# Patient Record
Sex: Female | Born: 1955 | Race: White | Hispanic: No | Marital: Married | Smoking: Former smoker
Health system: Southern US, Community
[De-identification: ages and names within clinical notes are randomized; demographics above are authoritative.]

## PROBLEM LIST (undated history)

## (undated) DIAGNOSIS — Z5189 Encounter for other specified aftercare: Secondary | ICD-10-CM

## (undated) DIAGNOSIS — E785 Hyperlipidemia, unspecified: Secondary | ICD-10-CM

## (undated) DIAGNOSIS — M1990 Unspecified osteoarthritis, unspecified site: Secondary | ICD-10-CM

## (undated) DIAGNOSIS — G43909 Migraine, unspecified, not intractable, without status migrainosus: Secondary | ICD-10-CM

## (undated) DIAGNOSIS — F32A Depression, unspecified: Secondary | ICD-10-CM

## (undated) DIAGNOSIS — K219 Gastro-esophageal reflux disease without esophagitis: Secondary | ICD-10-CM

## (undated) DIAGNOSIS — R32 Unspecified urinary incontinence: Secondary | ICD-10-CM

## (undated) DIAGNOSIS — K589 Irritable bowel syndrome without diarrhea: Secondary | ICD-10-CM

## (undated) DIAGNOSIS — M797 Fibromyalgia: Secondary | ICD-10-CM

## (undated) DIAGNOSIS — D126 Benign neoplasm of colon, unspecified: Secondary | ICD-10-CM

## (undated) DIAGNOSIS — M8580 Other specified disorders of bone density and structure, unspecified site: Secondary | ICD-10-CM

## (undated) DIAGNOSIS — F419 Anxiety disorder, unspecified: Secondary | ICD-10-CM

## (undated) DIAGNOSIS — Z87442 Personal history of urinary calculi: Secondary | ICD-10-CM

## (undated) DIAGNOSIS — F329 Major depressive disorder, single episode, unspecified: Secondary | ICD-10-CM

## (undated) DIAGNOSIS — H269 Unspecified cataract: Secondary | ICD-10-CM

## (undated) DIAGNOSIS — Z9889 Other specified postprocedural states: Secondary | ICD-10-CM

## (undated) DIAGNOSIS — IMO0001 Reserved for inherently not codable concepts without codable children: Secondary | ICD-10-CM

## (undated) DIAGNOSIS — T7840XA Allergy, unspecified, initial encounter: Secondary | ICD-10-CM

## (undated) DIAGNOSIS — R112 Nausea with vomiting, unspecified: Secondary | ICD-10-CM

## (undated) DIAGNOSIS — N200 Calculus of kidney: Secondary | ICD-10-CM

## (undated) DIAGNOSIS — I10 Essential (primary) hypertension: Secondary | ICD-10-CM

## (undated) DIAGNOSIS — E669 Obesity, unspecified: Secondary | ICD-10-CM

## (undated) HISTORY — PX: DEBRIDEMENT TENNIS ELBOW: SHX1442

## (undated) HISTORY — DX: Calculus of kidney: N20.0

## (undated) HISTORY — PX: ABDOMINAL HYSTERECTOMY: SHX81

## (undated) HISTORY — DX: Allergy, unspecified, initial encounter: T78.40XA

## (undated) HISTORY — DX: Migraine, unspecified, not intractable, without status migrainosus: G43.909

## (undated) HISTORY — DX: Nausea with vomiting, unspecified: Z98.890

## (undated) HISTORY — DX: Major depressive disorder, single episode, unspecified: F32.9

## (undated) HISTORY — DX: Reserved for inherently not codable concepts without codable children: IMO0001

## (undated) HISTORY — DX: Nausea with vomiting, unspecified: R11.2

## (undated) HISTORY — DX: Unspecified osteoarthritis, unspecified site: M19.90

## (undated) HISTORY — DX: Obesity, unspecified: E66.9

## (undated) HISTORY — DX: Unspecified cataract: H26.9

## (undated) HISTORY — PX: LUMBAR FUSION: SHX111

## (undated) HISTORY — PX: TOTAL KNEE ARTHROPLASTY: SHX125

## (undated) HISTORY — DX: Anxiety disorder, unspecified: F41.9

## (undated) HISTORY — DX: Gastro-esophageal reflux disease without esophagitis: K21.9

## (undated) HISTORY — DX: Encounter for other specified aftercare: Z51.89

## (undated) HISTORY — PX: APPENDECTOMY: SHX54

## (undated) HISTORY — PX: ROTATOR CUFF REPAIR: SHX139

## (undated) HISTORY — DX: Unspecified urinary incontinence: R32

## (undated) HISTORY — DX: Hyperlipidemia, unspecified: E78.5

## (undated) HISTORY — PX: EYE SURGERY: SHX253

## (undated) HISTORY — DX: Fibromyalgia: M79.7

## (undated) HISTORY — DX: Benign neoplasm of colon, unspecified: D12.6

## (undated) HISTORY — DX: Irritable bowel syndrome without diarrhea: K58.9

## (undated) HISTORY — DX: Other specified disorders of bone density and structure, unspecified site: M85.80

## (undated) HISTORY — DX: Depression, unspecified: F32.A

---
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
## 1990-08-13 HISTORY — PX: ABDOMINAL HYSTERECTOMY: SHX81

## 1992-08-13 HISTORY — PX: FOOT SURGERY: SHX648

## 1998-08-13 HISTORY — PX: BACK SURGERY: SHX140

## 1999-01-16 ENCOUNTER — Encounter: Payer: Self-pay | Admitting: Family Medicine

## 1999-01-16 ENCOUNTER — Ambulatory Visit (HOSPITAL_COMMUNITY): Admission: RE | Admit: 1999-01-16 | Discharge: 1999-01-16 | Payer: Self-pay | Admitting: Family Medicine

## 1999-01-20 ENCOUNTER — Encounter: Admission: RE | Admit: 1999-01-20 | Discharge: 1999-02-03 | Payer: Self-pay | Admitting: Family Medicine

## 1999-03-03 ENCOUNTER — Encounter: Payer: Self-pay | Admitting: Neurosurgery

## 1999-03-03 ENCOUNTER — Ambulatory Visit (HOSPITAL_COMMUNITY): Admission: RE | Admit: 1999-03-03 | Discharge: 1999-03-03 | Payer: Self-pay | Admitting: Neurosurgery

## 1999-03-20 ENCOUNTER — Encounter: Payer: Self-pay | Admitting: Neurosurgery

## 1999-03-20 ENCOUNTER — Ambulatory Visit (HOSPITAL_COMMUNITY): Admission: RE | Admit: 1999-03-20 | Discharge: 1999-03-20 | Payer: Self-pay | Admitting: Neurosurgery

## 1999-04-07 ENCOUNTER — Encounter: Payer: Self-pay | Admitting: Neurosurgery

## 1999-04-07 ENCOUNTER — Ambulatory Visit (HOSPITAL_COMMUNITY): Admission: RE | Admit: 1999-04-07 | Discharge: 1999-04-07 | Payer: Self-pay | Admitting: Neurosurgery

## 2000-05-28 ENCOUNTER — Encounter: Admission: RE | Admit: 2000-05-28 | Discharge: 2000-05-29 | Payer: Self-pay | Admitting: Family Medicine

## 2000-08-13 HISTORY — PX: BACK SURGERY: SHX140

## 2000-08-13 HISTORY — PX: NISSEN FUNDOPLICATION: SHX2091

## 2001-03-04 ENCOUNTER — Encounter: Admission: RE | Admit: 2001-03-04 | Discharge: 2001-03-04 | Payer: Self-pay | Admitting: Neurosurgery

## 2001-03-04 ENCOUNTER — Encounter: Payer: Self-pay | Admitting: Neurosurgery

## 2001-03-27 ENCOUNTER — Encounter: Payer: Self-pay | Admitting: Neurosurgery

## 2001-03-27 ENCOUNTER — Encounter: Admission: RE | Admit: 2001-03-27 | Discharge: 2001-03-27 | Payer: Self-pay | Admitting: Neurosurgery

## 2001-04-11 ENCOUNTER — Encounter: Payer: Self-pay | Admitting: Neurosurgery

## 2001-04-15 ENCOUNTER — Encounter: Payer: Self-pay | Admitting: Neurosurgery

## 2001-04-15 ENCOUNTER — Inpatient Hospital Stay (HOSPITAL_COMMUNITY): Admission: RE | Admit: 2001-04-15 | Discharge: 2001-04-18 | Payer: Self-pay | Admitting: Neurosurgery

## 2001-04-25 ENCOUNTER — Encounter: Admission: RE | Admit: 2001-04-25 | Discharge: 2001-04-25 | Payer: Self-pay | Admitting: Neurosurgery

## 2001-04-25 ENCOUNTER — Encounter: Payer: Self-pay | Admitting: Neurosurgery

## 2001-06-10 ENCOUNTER — Other Ambulatory Visit: Admission: RE | Admit: 2001-06-10 | Discharge: 2001-06-10 | Payer: Self-pay | Admitting: Family Medicine

## 2001-06-30 ENCOUNTER — Encounter: Admission: RE | Admit: 2001-06-30 | Discharge: 2001-07-14 | Payer: Self-pay | Admitting: Orthopedic Surgery

## 2001-07-14 ENCOUNTER — Encounter: Admission: RE | Admit: 2001-07-14 | Discharge: 2001-09-09 | Payer: Self-pay | Admitting: Neurosurgery

## 2001-07-16 ENCOUNTER — Ambulatory Visit (HOSPITAL_BASED_OUTPATIENT_CLINIC_OR_DEPARTMENT_OTHER): Admission: RE | Admit: 2001-07-16 | Discharge: 2001-07-16 | Payer: Self-pay | Admitting: Orthopedic Surgery

## 2001-07-29 ENCOUNTER — Encounter: Admission: RE | Admit: 2001-07-29 | Discharge: 2001-08-29 | Payer: Self-pay | Admitting: Orthopedic Surgery

## 2001-08-13 HISTORY — PX: ELBOW SURGERY: SHX618

## 2001-08-13 HISTORY — PX: NASAL SINUS SURGERY: SHX719

## 2001-10-28 ENCOUNTER — Inpatient Hospital Stay (HOSPITAL_COMMUNITY): Admission: RE | Admit: 2001-10-28 | Discharge: 2001-11-01 | Payer: Self-pay | Admitting: Neurosurgery

## 2001-10-28 ENCOUNTER — Encounter: Payer: Self-pay | Admitting: Neurosurgery

## 2001-11-26 ENCOUNTER — Ambulatory Visit (HOSPITAL_BASED_OUTPATIENT_CLINIC_OR_DEPARTMENT_OTHER): Admission: RE | Admit: 2001-11-26 | Discharge: 2001-11-26 | Payer: Self-pay | Admitting: Orthopedic Surgery

## 2002-01-21 ENCOUNTER — Encounter: Admission: RE | Admit: 2002-01-21 | Discharge: 2002-01-21 | Payer: Self-pay | Admitting: Neurosurgery

## 2002-01-21 ENCOUNTER — Encounter: Payer: Self-pay | Admitting: Neurosurgery

## 2002-02-10 ENCOUNTER — Encounter: Payer: Self-pay | Admitting: Orthopaedic Surgery

## 2002-02-10 ENCOUNTER — Inpatient Hospital Stay (HOSPITAL_COMMUNITY): Admission: RE | Admit: 2002-02-10 | Discharge: 2002-02-14 | Payer: Self-pay | Admitting: Orthopaedic Surgery

## 2002-02-13 ENCOUNTER — Encounter: Payer: Self-pay | Admitting: Orthopaedic Surgery

## 2002-02-15 ENCOUNTER — Emergency Department (HOSPITAL_COMMUNITY): Admission: EM | Admit: 2002-02-15 | Discharge: 2002-02-15 | Payer: Self-pay | Admitting: Emergency Medicine

## 2002-02-15 ENCOUNTER — Encounter: Payer: Self-pay | Admitting: Specialist

## 2002-05-19 ENCOUNTER — Encounter: Admission: RE | Admit: 2002-05-19 | Discharge: 2002-08-17 | Payer: Self-pay | Admitting: Orthopaedic Surgery

## 2002-08-03 ENCOUNTER — Ambulatory Visit (HOSPITAL_BASED_OUTPATIENT_CLINIC_OR_DEPARTMENT_OTHER): Admission: RE | Admit: 2002-08-03 | Discharge: 2002-08-03 | Payer: Self-pay | Admitting: Family Medicine

## 2002-08-13 HISTORY — PX: SPINAL CORD STIMULATOR IMPLANT: SHX2422

## 2002-08-13 HISTORY — PX: NISSEN FUNDOPLICATION: SHX2091

## 2002-08-13 HISTORY — PX: KIDNEY STONE SURGERY: SHX686

## 2002-08-27 ENCOUNTER — Ambulatory Visit (HOSPITAL_COMMUNITY): Admission: RE | Admit: 2002-08-27 | Discharge: 2002-08-27 | Payer: Self-pay | Admitting: Gastroenterology

## 2002-10-02 ENCOUNTER — Encounter: Payer: Self-pay | Admitting: General Surgery

## 2002-10-02 ENCOUNTER — Encounter: Admission: RE | Admit: 2002-10-02 | Discharge: 2002-10-02 | Payer: Self-pay | Admitting: General Surgery

## 2002-10-06 ENCOUNTER — Ambulatory Visit (HOSPITAL_COMMUNITY): Admission: RE | Admit: 2002-10-06 | Discharge: 2002-10-06 | Payer: Self-pay | Admitting: Gastroenterology

## 2002-11-06 ENCOUNTER — Encounter: Payer: Self-pay | Admitting: General Surgery

## 2002-11-07 ENCOUNTER — Inpatient Hospital Stay (HOSPITAL_COMMUNITY): Admission: RE | Admit: 2002-11-07 | Discharge: 2002-11-08 | Payer: Self-pay | Admitting: General Surgery

## 2002-12-21 ENCOUNTER — Ambulatory Visit (HOSPITAL_COMMUNITY): Admission: RE | Admit: 2002-12-21 | Discharge: 2002-12-21 | Payer: Self-pay | Admitting: General Surgery

## 2002-12-21 ENCOUNTER — Encounter: Payer: Self-pay | Admitting: General Surgery

## 2003-01-18 ENCOUNTER — Other Ambulatory Visit: Admission: RE | Admit: 2003-01-18 | Discharge: 2003-01-18 | Payer: Self-pay | Admitting: Family Medicine

## 2003-02-18 ENCOUNTER — Ambulatory Visit (HOSPITAL_BASED_OUTPATIENT_CLINIC_OR_DEPARTMENT_OTHER): Admission: RE | Admit: 2003-02-18 | Discharge: 2003-02-18 | Payer: Self-pay | Admitting: Urology

## 2003-02-18 ENCOUNTER — Encounter: Payer: Self-pay | Admitting: Urology

## 2003-02-26 ENCOUNTER — Encounter: Payer: Self-pay | Admitting: Family Medicine

## 2003-02-26 ENCOUNTER — Ambulatory Visit (HOSPITAL_COMMUNITY): Admission: RE | Admit: 2003-02-26 | Discharge: 2003-02-26 | Payer: Self-pay | Admitting: Family Medicine

## 2003-08-14 HISTORY — PX: SPINAL CORD STIMULATOR IMPLANT: SHX2422

## 2004-03-02 ENCOUNTER — Ambulatory Visit (HOSPITAL_COMMUNITY): Admission: RE | Admit: 2004-03-02 | Discharge: 2004-03-03 | Payer: Self-pay | Admitting: Orthopaedic Surgery

## 2005-04-24 ENCOUNTER — Encounter: Admission: RE | Admit: 2005-04-24 | Discharge: 2005-05-03 | Payer: Self-pay | Admitting: Orthopaedic Surgery

## 2005-08-13 HISTORY — PX: LUMBAR FUSION: SHX111

## 2005-12-03 ENCOUNTER — Ambulatory Visit (HOSPITAL_BASED_OUTPATIENT_CLINIC_OR_DEPARTMENT_OTHER): Admission: RE | Admit: 2005-12-03 | Discharge: 2005-12-03 | Payer: Self-pay | Admitting: Orthopaedic Surgery

## 2005-12-14 ENCOUNTER — Encounter: Admission: RE | Admit: 2005-12-14 | Discharge: 2005-12-14 | Payer: Self-pay | Admitting: Orthopaedic Surgery

## 2006-01-10 ENCOUNTER — Inpatient Hospital Stay (HOSPITAL_COMMUNITY): Admission: RE | Admit: 2006-01-10 | Discharge: 2006-01-14 | Payer: Self-pay | Admitting: Orthopaedic Surgery

## 2006-05-27 ENCOUNTER — Ambulatory Visit: Payer: Self-pay | Admitting: Gastroenterology

## 2006-06-13 DIAGNOSIS — D126 Benign neoplasm of colon, unspecified: Secondary | ICD-10-CM

## 2006-06-13 HISTORY — DX: Benign neoplasm of colon, unspecified: D12.6

## 2006-06-19 ENCOUNTER — Encounter (INDEPENDENT_AMBULATORY_CARE_PROVIDER_SITE_OTHER): Payer: Self-pay | Admitting: *Deleted

## 2006-06-19 ENCOUNTER — Ambulatory Visit: Payer: Self-pay | Admitting: Gastroenterology

## 2006-06-20 ENCOUNTER — Ambulatory Visit (HOSPITAL_COMMUNITY): Admission: RE | Admit: 2006-06-20 | Discharge: 2006-06-20 | Payer: Self-pay | Admitting: Obstetrics and Gynecology

## 2006-06-28 ENCOUNTER — Encounter: Admission: RE | Admit: 2006-06-28 | Discharge: 2006-06-28 | Payer: Self-pay | Admitting: Orthopaedic Surgery

## 2006-07-01 ENCOUNTER — Encounter: Payer: Self-pay | Admitting: Cardiology

## 2006-07-10 ENCOUNTER — Ambulatory Visit (HOSPITAL_COMMUNITY): Admission: RE | Admit: 2006-07-10 | Discharge: 2006-07-10 | Payer: Self-pay | Admitting: Orthopaedic Surgery

## 2006-08-08 ENCOUNTER — Ambulatory Visit (HOSPITAL_BASED_OUTPATIENT_CLINIC_OR_DEPARTMENT_OTHER): Admission: RE | Admit: 2006-08-08 | Discharge: 2006-08-08 | Payer: Self-pay | Admitting: Orthopaedic Surgery

## 2006-08-13 HISTORY — PX: SPINAL CORD STIMULATOR REMOVAL: SHX2423

## 2006-08-13 HISTORY — PX: KNEE ARTHROSCOPY: SUR90

## 2006-10-31 ENCOUNTER — Inpatient Hospital Stay (HOSPITAL_COMMUNITY): Admission: RE | Admit: 2006-10-31 | Discharge: 2006-11-03 | Payer: Self-pay | Admitting: Orthopaedic Surgery

## 2007-02-25 ENCOUNTER — Encounter: Admission: RE | Admit: 2007-02-25 | Discharge: 2007-02-25 | Payer: Self-pay | Admitting: Orthopaedic Surgery

## 2007-03-18 ENCOUNTER — Ambulatory Visit (HOSPITAL_BASED_OUTPATIENT_CLINIC_OR_DEPARTMENT_OTHER): Admission: RE | Admit: 2007-03-18 | Discharge: 2007-03-18 | Payer: Self-pay | Admitting: Orthopaedic Surgery

## 2007-05-22 ENCOUNTER — Encounter: Admission: RE | Admit: 2007-05-22 | Discharge: 2007-05-22 | Payer: Self-pay | Admitting: Orthopaedic Surgery

## 2007-07-14 ENCOUNTER — Encounter: Admission: RE | Admit: 2007-07-14 | Discharge: 2007-07-14 | Payer: Self-pay | Admitting: Orthopaedic Surgery

## 2007-12-05 ENCOUNTER — Inpatient Hospital Stay (HOSPITAL_COMMUNITY): Admission: RE | Admit: 2007-12-05 | Discharge: 2007-12-11 | Payer: Self-pay | Admitting: Orthopaedic Surgery

## 2008-01-09 ENCOUNTER — Encounter: Admission: RE | Admit: 2008-01-09 | Discharge: 2008-01-09 | Payer: Self-pay | Admitting: Orthopaedic Surgery

## 2008-02-27 ENCOUNTER — Ambulatory Visit (HOSPITAL_COMMUNITY): Admission: RE | Admit: 2008-02-27 | Discharge: 2008-02-27 | Payer: Self-pay | Admitting: Orthopaedic Surgery

## 2008-06-17 ENCOUNTER — Encounter: Admission: RE | Admit: 2008-06-17 | Discharge: 2008-06-17 | Payer: Self-pay | Admitting: Orthopaedic Surgery

## 2008-06-22 ENCOUNTER — Encounter: Admission: RE | Admit: 2008-06-22 | Discharge: 2008-06-22 | Payer: Self-pay | Admitting: Orthopaedic Surgery

## 2008-10-10 ENCOUNTER — Encounter: Payer: Self-pay | Admitting: Cardiology

## 2008-12-29 ENCOUNTER — Encounter: Admission: RE | Admit: 2008-12-29 | Discharge: 2008-12-29 | Payer: Self-pay | Admitting: Orthopaedic Surgery

## 2009-02-15 ENCOUNTER — Encounter: Admission: RE | Admit: 2009-02-15 | Discharge: 2009-02-15 | Payer: Self-pay | Admitting: Orthopaedic Surgery

## 2009-03-25 ENCOUNTER — Encounter: Payer: Self-pay | Admitting: Cardiology

## 2009-03-30 ENCOUNTER — Encounter: Payer: Self-pay | Admitting: Cardiology

## 2009-04-04 DIAGNOSIS — I10 Essential (primary) hypertension: Secondary | ICD-10-CM | POA: Insufficient documentation

## 2009-04-04 DIAGNOSIS — J45909 Unspecified asthma, uncomplicated: Secondary | ICD-10-CM | POA: Insufficient documentation

## 2009-04-04 DIAGNOSIS — Z8669 Personal history of other diseases of the nervous system and sense organs: Secondary | ICD-10-CM | POA: Insufficient documentation

## 2009-04-04 DIAGNOSIS — Z87898 Personal history of other specified conditions: Secondary | ICD-10-CM | POA: Insufficient documentation

## 2009-04-06 ENCOUNTER — Ambulatory Visit: Payer: Self-pay | Admitting: Cardiology

## 2009-07-01 ENCOUNTER — Ambulatory Visit (HOSPITAL_COMMUNITY): Admission: RE | Admit: 2009-07-01 | Discharge: 2009-07-01 | Payer: Self-pay | Admitting: Orthopaedic Surgery

## 2009-07-22 ENCOUNTER — Inpatient Hospital Stay (HOSPITAL_COMMUNITY): Admission: RE | Admit: 2009-07-22 | Discharge: 2009-07-27 | Payer: Self-pay | Admitting: Orthopaedic Surgery

## 2009-09-21 ENCOUNTER — Encounter: Admission: RE | Admit: 2009-09-21 | Discharge: 2009-12-20 | Payer: Self-pay | Admitting: Orthopaedic Surgery

## 2010-03-14 ENCOUNTER — Emergency Department (HOSPITAL_COMMUNITY): Admission: EM | Admit: 2010-03-14 | Discharge: 2010-03-14 | Payer: Self-pay | Admitting: Emergency Medicine

## 2010-09-03 ENCOUNTER — Encounter: Payer: Self-pay | Admitting: Obstetrics and Gynecology

## 2010-09-04 ENCOUNTER — Encounter: Payer: Self-pay | Admitting: Orthopaedic Surgery

## 2010-11-14 LAB — PROTIME-INR
INR: 1.15 (ref 0.00–1.49)
INR: 1.84 — ABNORMAL HIGH (ref 0.00–1.49)
INR: 1.87 — ABNORMAL HIGH (ref 0.00–1.49)
Prothrombin Time: 18.4 seconds — ABNORMAL HIGH (ref 11.6–15.2)
Prothrombin Time: 21.1 seconds — ABNORMAL HIGH (ref 11.6–15.2)

## 2010-11-14 LAB — BASIC METABOLIC PANEL
BUN: 11 mg/dL (ref 6–23)
BUN: 15 mg/dL (ref 6–23)
CO2: 28 mEq/L (ref 19–32)
CO2: 29 mEq/L (ref 19–32)
Calcium: 8.8 mg/dL (ref 8.4–10.5)
Chloride: 103 mEq/L (ref 96–112)
Chloride: 104 mEq/L (ref 96–112)
Chloride: 106 mEq/L (ref 96–112)
Creatinine, Ser: 0.65 mg/dL (ref 0.4–1.2)
GFR calc Af Amer: >60 mL/min (ref >60)
GFR calc non Af Amer: >60 mL/min (ref >60)
Glucose, Bld: 142 mg/dL — ABNORMAL HIGH (ref 70–99)
Glucose, Bld: 97 mg/dL (ref 70–99)
Potassium: 3.4 mEq/L — ABNORMAL LOW (ref 3.5–5.1)
Potassium: 3.7 mEq/L (ref 3.5–5.1)
Sodium: 136 mEq/L (ref 135–145)

## 2010-11-14 LAB — CBC
HCT: 27.1 % — ABNORMAL LOW (ref 36.0–46.0)
HCT: 28.1 % — ABNORMAL LOW (ref 36.0–46.0)
HCT: 29.2 % — ABNORMAL LOW (ref 36.0–46.0)
Hemoglobin: 9.3 g/dL — ABNORMAL LOW (ref 12.0–15.0)
MCHC: 32.8 g/dL (ref 30.0–36.0)
MCHC: 32.9 g/dL (ref 30.0–36.0)
MCV: 83.1 fL (ref 78.0–100.0)
MCV: 83.7 fL (ref 78.0–100.0)
MCV: 84.4 fL (ref 78.0–100.0)
MCV: 84.5 fL (ref 78.0–100.0)
Platelets: 187 10*3/uL (ref 150–400)
Platelets: 257 10*3/uL (ref 150–400)
RBC: 3.45 MIL/uL — ABNORMAL LOW (ref 3.87–5.11)
RDW: 15.1 % (ref 11.5–15.5)
RDW: 15.4 % (ref 11.5–15.5)
WBC: 7.7 10*3/uL (ref 4.0–10.5)
WBC: 8.1 10*3/uL (ref 4.0–10.5)

## 2010-11-14 LAB — URINE CULTURE: Colony Count: 9000

## 2010-11-14 LAB — ANAEROBIC CULTURE

## 2010-11-14 LAB — TISSUE CULTURE: Culture: NO GROWTH

## 2010-11-14 LAB — GRAM STAIN

## 2010-12-26 NOTE — Op Note (Signed)
NAMEHALEIGH, DESMITH NO.:  [id number]   MEDICAL RECORD NO.:  [id number]          PATIENT TYPE:  AMB   LOCATION:  SDS                          FACILITY:  [facility]   PHYSICIAN:  Vanita Panda. Magnus Ivan, M.D.DATE OF BIRTH:  November 24, 1955   DATE OF PROCEDURE:  02/27/2008  DATE OF DISCHARGE:  02/27/2008                               OPERATIVE REPORT   PREOPERATIVE DIAGNOSES:  Left knee patellofemoral crepitation and  patella clunk, status post total knee arthroplasty.   POSTOPERATIVE DIAGNOSES:  Left knee patellofemoral crepitation and  patella clunk, status post total knee arthroplasty.   PROCEDURE:  Left knee arthroscopy with debridement.   SURGEON:  Vanita Panda. Magnus Ivan, MD   ANESTHESIA:  General.   BLOOD LOSS:  Minimal.   COMPLICATIONS:  None.   INDICATIONS:  Ms. Casebeer is a 55 year old who almost 3 months ago  underwent a left total knee arthroplasty.  She comes in complaining of  patellofemoral crepitation and grinding, and there is a slight clunk to  her patella.  This is likely build up a scar tissue, and I recommended  she undergo arthroscopy with debridement.  The risks and benefits of  this were explained to her at length, and she agreed to proceed with  surgery.   PROCEDURE:  After informed consent was obtained, appropriate left knee  was marked.  She was brought to the operating room and placed supine on  the operating table.  General anesthesia was then obtained.  Antibiotic  had already been given as well.  Her left leg was then prepped and  draped with DuraPrep and sterile drapes including a sterile stockinette.  Sterile tourniquet was placed underneath, but it was not utilized during  the case.  A time-out was called again.  She was identified as  the  correct patient and correct extremity.  I then made an anterolateral  portal.  An arthroscope was inserted, you could see there was abundant  scar tissue associated with patella in the  anterior aspect of the knee.  I made a medial portal and used an  arthroscopic shaver lightly to  debride the scar tissue along the anterior aspect of the knee.  The  femoral component and tibial component all seemed to be intact as well  as the polyethylene insert, and there were no other problems that I  could see within the knee.  I then removed all instrumentation, allowed  fluid to lavage from the knee, and then injected a small mixture of 8 mg  of Morphine and  0.5% Sensorcaine with epinephrine to the knee.  The arthroscopic portal  sites were closed with interrupted 3-0 nylon suture.  Xeroform followed,  well-padded sterile dressing was applied, and the patient was awakened,  extubated, and taken to the recovery room in stable condition.  All  final counts were correct, and no other complications noted.      Vanita Panda. Magnus Ivan, M.D.  Electronically Signed     CYB/MEDQ  D:  02/27/2008  T:  02/28/2008  Job:  [id number]

## 2010-12-26 NOTE — Discharge Summary (Signed)
NAMEMarland Kitchen  Baker, Valerie Baker NO.:  [id number]   MEDICAL RECORD NO.:  [id number]          PATIENT TYPE:  INP   LOCATION:  5013                         FACILITY:  [facility]   PHYSICIAN:  Vanita Panda. Magnus Ivan, M.D.DATE OF BIRTH:  [birth year]   DATE OF ADMISSION:  12/05/2007  DATE OF DISCHARGE:  12/11/2007                               DISCHARGE SUMMARY   ADMISSION DIAGNOSIS:  Severe degenerative joint disease of left knee.   FINAL DISCHARGE DIAGNOSIS:  Severe degenerative joint disease of left  knee.   PROCEDURE:  Left total knee arthroplasty on December 05, 2007.   HOSPITAL COURSE:  [name] is a 55 year old female with severe  osteoarthritis involving her left knee.  She had failed conservative  treatment with antiinflammatories and NSAIDs and antiinflammatory  injections in her knee. We tried physical therapy and even the  arthroscopic surgery that showed significant cartilage defects  throughout the knee.  After worsening pain and the effect of that in her  daily living, we talked about total knee replacement and she agreed to  proceed with this.  On the day of admission, she was taken to the  operating room where she underwent a total knee arthroplasty of the left  knee without complications.   Postoperatively, she was admitted to regular floor and started on a CPM  full range of motion of her knee and she was started on DVT prophylaxis  and progressed slowly through physical therapy.  By the day of  discharge, her incision was clean, dry, and intact.  She was afebrile  with stable vital signs.  She was tolerating oral diet as well as oral  pain medications.  It was felt she could be discharged safely to home  with further home health physical therapy.   DISPOSITION:  To home.   DISCHARGE INSTRUCTIONS:  While she is at home, she will continue her CPM  and continue to weight bear as tolerated with home health physical  therapy working on her gait training,  mobility, and range of motion of  her knee.  Follow up with the office will be established in 2 weeks.   DISCHARGE MEDICATIONS:  1. Robaxin.  2. Coumadin adjusted for target INR of 2-3.  3. Amlodipine.  4. Sertraline.  5. Prevacid.  6. Singulair.  7. Morphine sulphate.  8. Lyrica.  9. Endocet.  10.Oxybutynin.  11.Trazodone.  12.Lactulose.      Vanita Panda. Magnus Ivan, M.D.  Electronically Signed     CYB/MEDQ  D:  12/11/2007  T:  12/11/2007  Job:  [id number]

## 2010-12-26 NOTE — Op Note (Signed)
NAMEMarland Baker  [name], [name] NO.:  [id number]   MEDICAL RECORD NO.:  [id number]          PATIENT TYPE:  AMB   LOCATION:  DSC                          FACILITY:  [facility]   PHYSICIAN:  Sharolyn Douglas, M.D.        DATE OF BIRTH:  Feb 29, 1956   DATE OF PROCEDURE:  03/18/2007  DATE OF DISCHARGE:                               OPERATIVE REPORT   DIAGNOSIS:  Lumbar spondylosis.  History of previous lumbar fusion from  L2 to L5 with chronic pain syndrome.   PROCEDURE:  1. Bilateral L1-2 facet joint injections.  2. Fluoroscopic imaging used for the above injections during needle      placement.   SURGEON:  Sharolyn Douglas, MD.   ASSISTANT:  None.   ANESTHESIA:  MAC plus local.   COMPLICATIONS:  None.   INDICATIONS:  The patient is a pleasant 55 year old female with chronic  pain syndrome.  She has had previous L2 to L5 fusion.  She now presents  for diagnostic, potentially therapeutic ,facet injections done above the  fusion at L1-2.  Risk, benefits, alternatives reviewed.  The patient  elected to proceed.   PROCEDURE:  After informed consent, she was taken the operating room.  She was turned prone.  The back was prepped, draped in the usual sterile  fashion.  She underwent sedation by Anesthesia.  Fluoroscopy was brought  into the field and we identified the L1-2 facet joints.  22 gauge  Quincke spinal needles were then advanced into the facet joints  bilaterally at L1-2 under fluoroscopy.  Aspiration showed no blood.  We  then injected a solution of 80 mg Depo-Medrol, 1 mL 1% preservative-free  lidocaine in and around each facet joint capsule.  The patient tolerated  the procedure well.  There were no apparent complications.  She was  transferred to recovery in stable condition.  I reviewed post injection  instructions with her.  She will follow-up in two to three weeks.      Sharolyn Douglas, M.D.  Electronically Signed     MC/MEDQ  D:  03/18/2007  T:  03/18/2007  Job:  [id number]

## 2010-12-26 NOTE — Op Note (Signed)
NAMEMarland Kitchen  Baker, Valerie Baker NO.:  [id number]   MEDICAL RECORD NO.:  [id number]          PATIENT TYPE:  INP   LOCATION:  5013                         FACILITY:  [facility]   PHYSICIAN:  Vanita Panda. Magnus Ivan, M.D.DATE OF BIRTH:  10/12/1955   DATE OF PROCEDURE:  12/05/2007  DATE OF DISCHARGE:                               OPERATIVE REPORT   PREOPERATIVE DIAGNOSES:  Severe degenerative joint disease and  osteoarthritis, left knee.   POSTOPERATIVE DIAGNOSES:  Severe degenerative joint disease and  osteoarthritis, left knee.   PROCEDURE:  Left total knee arthroplasty using computer navigation.   IMPLANTS:  DePuy rotating platform knee with size 3 femur, size 3 tibia  component, 12.5-mm polyethylene insert, and size 35-mm patellar button.   SURGEON:  Vanita Panda. Magnus Ivan, MD   ASSISTANT:  Wende Neighbors, PA-C   ANESTHESIA:  1. Left leg femoral nerve block.  2. General.   ANTIBIOTICS:  2 g of IV Ancef.   TOURNIQUET TIME:  1 hour and 50 minutes.   BLOOD LOSS:  200 mL.   COMPLICATIONS:  None.   INDICATIONS:  Briefly, Valerie Baker is a 55 year old female who I have  performed arthroscopic surgery on her left knee.  She had debilitating  pain in this knee and then arthroscopy.  She had significant varus  deformity of at least 9 degrees and almost bone-on-bone wear on the  medial side.  She had some patellofemoral arthritis as well as bone  spurs on the tibial plateau.  I have tried to temporize her with  injections and anti-inflammatories and the pain was worsening.  She  wished to proceed with a total knee replacement.  I counseled to her  extensively including the possible revision in the future given her age  and the need to lose weight.  She wished to proceed with surgery and  understood the risks and benefits.  I also described to her the risk of  a DVT and a fatal PE.   PROCEDURE:  After informed consent was obtained, the appropriate left  leg was  marked and femoral block was obtained by anesthesia.  She was  then brought to the operating room and placed in supine on the operating  table.  General anesthesia was then obtained.  A nonsterile tourniquet  was placed around her upper left leg.  Her leg was prepped and draped  with DuraPrep and sterile drapes including a sterile stockinette.  A  time out was called, and she was identified as the correct patient and  correct left knee.  I then used an Esmarch to wrap out the leg and  tourniquet was inflated to 350 mm of pressure.  A midline incision was  made directly over the patella and carried proximally and distally.  I  then dissected the soft tissues and took a medial parapatellar approach  to the knee just off the vastus medialis and then coming around the  patella down to the tibial tubercle area.  I was able to easily invert  the patella.  Then, I cleaned the soft tissue including the medial and  lateral meniscus, the fat pad  of the ACL and PCL, as well as  osteophytes.  Next, I proceeded with the computer navigation portion of  the case.  Through 2 small separate stab incisions, guide pins were  inserted from an anteromedial to posterolateral direction in the tibia  and then guide pins were inserted in the anteromedial to posterolateral  in the femur.  I then placed soft tissue arrays for the navigation  portion of the case.  I then used computer navigation to create a model  of the knee.  First, a tibia cut was then made, taking 10 mm off the  high side and 5 mm off the low side.  This was verified under computer  navigation with a neutral slope.  Next, a distal femoral cut was made  followed by the posterior femoral cut and the chamfer cuts.  I used  spacer blocks in flexion and extension to correctly balance the knee,  and this was verified under the computer navigation as well.  I also  balanced the soft tissues and released as much as I could on the medial  side including  the semimembranosus.  With the computer help, we selected  a size 3 femur and a size 3 tibia, and these were placed with trial  components.  I then chose a 35-mm patellar button.  Once these trial  components were now placed, a 10 mm polyethylene was inserted for the  knee through range of motion and I felt that this was stable.  I then  removed all components.  I thoroughly irrigated the tissues using pulse  lavage and normal saline solution.  I then cemented the real size 3  tibial component, followed by the real size 3 femoral component.  I  placed a 10-mm trial insert and cemented the polyethylene button.  Once  the cement had dried up with the knee through range of motion and I did  not like the stability with the 10-mm and we went up to a 12.5-mm  polyethylene insert.  I felt that the stability improved dramatically.  This again was verified under fluoroscopic guidance.  I then placed the  real size 12.5 polyethylene insert.  I then copiously irrigated the  tissues again.  I let the tourniquet down, and hemostasis was obtained.  We placed a medium Hemovac drain.  I then closed the deep arthrotomy  with #1 Vicryl suture followed by 0 Vicryl in the deep tissue and next  layer with 2-0 Vicryl and then a running 4-0 Vicryl suture in  subcutaneous tissue and Steri-Strips were applied.  The patient's knee  was placed in a well-padded sterile dressing and a knee immobilizer.  She was awakened, extubated, and taken to the recovery room in stable  condition.  All final counts were correct, and there were no  complications noted.       Vanita Panda. Magnus Ivan, M.D.  Electronically Signed     CYB/MEDQ  D:  12/05/2007  T:  12/06/2007  Job:  [id number]

## 2010-12-29 NOTE — Op Note (Signed)
NAME:  Valerie Baker, Valerie Baker                     ACCOUNT NO.:  [id number]   MEDICAL RECORD NO.:  [id number]                   PATIENT TYPE:  OIB   LOCATION:  5034                                 FACILITY:  [facility]   PHYSICIAN:  Sharolyn Douglas, M.D.                     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   PROCEDURES:  1. Bilateral hemilaminotomy for placement of two permanent spinal cord     stimulator leads, T10, T11.  2. Implantation of rechargeable Irby pulse generator, left hip.  3. Radiographic interpretation of fluoroscopic images used for implantation     of prominent spinal cord stimulator leads.   SURGEON:  Sharolyn Douglas, M.D.   ASSISTANT:  Verlin Fester, P.A.   ANESTHESIA:  General endotracheal.   COMPLICATIONS:  None.   INDICATIONS:  Patient is a very pleasant 55 year old female with chronic  back and bilateral leg pain, left greater than right, secondary to post  laminectomy syndrome.  She has failed all conservative treatment options.  Dr. Ethelene Hal performed a trial spinal cord stimulator lead with good  improvement in her chronic pain.  She has elected to undergo permanent  placement of spinal cord stimulator leads and IPG in hopes of improving her  symptoms.  Risks, benefits and alternatives are extensively reviewed.  I had  counseled the patient regarding the benefits of performing the procedure  under conscious sedation.  Unfortunately, she does not feel that she could  tolerate the procedure while awake.  She knows the possibility of poor  placement if we cannot perform intraoperative testing, which would require  return to the operating room for adjustment.  She would like to proceed with  general anesthesia.   PROCEDURE:  Patient was properly identified in the  holding area and taken to  the operating room.  Underwent general endotracheal anesthesia without  difficulty and prophylactic IV antibiotics.  Carefully positioned onto the  Wilson frame.  All bony prominences were padded.  The face and eyes were  protected at all times.  The fluoroscopy was brought into the field.  In the  T10-11 interspace identified.  A 4 cm incision was made in the midline.  Dissection was carried down through the deep fascia.  The T10-11 interspace  was exposed bilaterally.  A deep retractor placed.  Hemilaminotomies  completed bilaterally with thin-foot Kerrison punches.  The ligamentum  flavum was released from the under surface of the T10 lamina.  At this  point, the stimulator leads could easily pass into the spinal canal.  Using  fluoroscopy, the leads were placed bilaterally.  Lateral imaging confirmed  dorsal placement.  We performed intraoperative neuromuscular testing and had  good contraction of the paraspinal muscles bilaterally,  both proximally and  distally.  The leads were then anchored to the interspinous ligament.  The  previous iliac crest bone graft incision was utilized for placement of the  IPG on the left side.  The previous incision was incised, which was in a  vertical fashion lateral to the PSIS.  A subcutaneous pocket was created 2  cm deep.  The tunneling tool was then used to tunnel the two stimulator  leads from the thoracic incision out to the left hip incision.  The IPG was  then placed into the subcutaneous pocket, and the leads attached.  The leaf  was placed both proximally and distally.  The IPG was tested.  The wounds  were irrigated.  The incision was closed with 0 Vicryl deep fascia  followed by 2-0 Vicryl and 3-0 subcuticular sutures.  Benzoin and Steri-  Strips placed.  Sterile dressing applied.  The patient was turned supine and  extubated without difficulty.  Transferred to the recovery room in stable  condition, able to move  her upper and lower extremities.                                               Sharolyn Douglas, M.D.    MC/MEDQ  D:  03/05/2004  T:  03/05/2004  Job:  [id number]

## 2010-12-29 NOTE — Op Note (Signed)
NAMEMarland Baker  ILHAM, ROUGHTON NO.:  [id number]   MEDICAL RECORD NO.:  [id number]          PATIENT TYPE:  INP   LOCATION:  2550                         FACILITY:  [facility]   PHYSICIAN:  Sharolyn Douglas, M.D.        DATE OF BIRTH:  1955-09-22   DATE OF PROCEDURE:  10/31/2006  DATE OF DISCHARGE:                               OPERATIVE REPORT   DIAGNOSES:  1. 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  2. Chronic pain syndrome.  3. Severe right groin and thigh pain.  4. Chronic back pain.   PROCEDURES:  1. Exploration of spinal fusion from L3-L5, with removal of segmental      pedicle screw instrumentation.  2. Revision laminectomy L2-3, with decompression of the thecal sac and      nerve roots.  3. Transforaminal lumbar interbody fusion L2-3, with placement of 9-mm      PEEK cage.  4. Posterior spinal fusion at L2-3.  5. Segmental pedicle screw instrumentation L2-L5.  6. Local autogenous bone graft supplemented with BMP.   SURGEON:  Sharolyn Douglas, M.D.   ASSISTANT:  Verlin Fester, P.A.   ANESTHESIA:  General endotracheal.   ESTIMATED BLOOD LOSS:  300 cc.   COMPLICATIONS:  None.   NEEDLE AND SPONGE COUNT:  Correct.   INDICATIONS:  The patient is a pleasant 55 year old female with chronic  pain syndrome.  She has had several previous lumbar procedures,  including an L3-5 instrumented decompression and fusion.  She has  developed severe right groin pain which is thought to be related to an  upper lumbar radiculopathy.  She had a diagnostic injection at L2-3 on  the right, with complete resolution of her groin pain for several hours.  She now presents for extension of the decompression and fusion across  the L2-3 segment.  She has a spinal cord stimulator in place, and we  will try to avoid damaging the wires.  She understands there is a  possibility the device may not function after surgery.  Risks,  benefits,  and alternatives were reviewed.  She elected to proceed.   PROCEDURE:  After informed consent, she was taken to the operating room.  She underwent general endotracheal anesthesia without difficulty and  given prophylactic IV antibiotics.  Neuro monitoring was established in  the form of lower extremity SSEPs and EMGs.  She was turned prone onto  the Crest frame.  All bony prominences were padded.  Face and eyes were  protected at all times.  Back prepped and draped in the usual sterile  fashion.  The previous incision was utilized.  Dissection was carried  through the scar.  Exposure was completed out over the instrumentation  from L3-L5.  Using the appropriate screwdrivers, the locking caps were  removed.  The fusion was then evaluated using the electrocautery from L3-  L5 and found to be solid.  In addition, pulling on the pedicle screws  showed they were well fixed, and also there was no motion occurring.  We  then turned our attention to  the L2-3 segment.  A revision laminectomy  was completed by removing the residual spinous process and lamina.  Curettes were used to dissect the edges of the previous epidural  fibrosis. The spinal canal was entered. The laminectomy was widened.  We  identified the L2 and L3 nerve roots on the right side.  The L3 root  appeared to be compressed within the lateral recess by the overhanging  superior facet.  This was decompressed flush with the pedicle.  We then  turned our attention to completing the posterior fusion.  Pedicle screws  were placed at L2 bilaterally using an anatomic probing technique.  We  utilized 5.5 x 45 mm screws.  The bone quality was average, and the  screw purchase was good.  We stimulated each pedicle screw with  triggered EMGs, and there were no deleterious changes.  Before placing  pedicle screws, we decorticated the transverse processes of L2 and also  the residual process of L3.  We then turned our attention to  completing  a transforaminal lumbar interbody fusion on the right side in order to  further decompress the exiting nerve root and improve the fusion rate.  The remaining facette joint was osteotomized.  The exiting and  transversing nerve roots were identified and protected at all times.  Free running EMGs were monitored.  The disc space was entered, and a  radical diskectomy was completed across the contralateral side using  curved curettes.  The disc space was dilated to 9 mm.  We then packed  the disc space with local bone graft obtained from laminectomy along  with BMP sponges from the medium Infuse kit.  A 9 mm PEEK cage was  packed with BMP sponge inserted into the interspace, tamped anteriorly  and across the midline.  There were no changes in the free running EMGs  throughout the TLIF procedure.  We completed the posterior spinal fusion  by packing the remaining local bone graft into the lateral gutters.  We  also completed a facette fusion on the left side.  We then placed short  segment titanium rods which had been bent into normal lordosis.  These  were applied into the polyaxial screw heads from L2 down to L5.  The  locking caps were applied. Compression was placed across the L2-3  segment before shearing off the locking caps.  Hemostasis was achieved.  Intraoperative x-ray was taken which showed appropriate positioning of  the instrumentation.  A deep Hemovac drain was placed.  We did not  encounter the stimulator wires throughout the procedure.  The deep  fascia was closed with a running #1 Vicryl suture.  Subcutaneous layer  was closed with 0 Vicryl suture, 2-0 Vicryl suture, and then a running 3-  0 nylon suture to approximate the skin edges.  Sterile dressing was  applied.  The patient was turned supine, extubated without difficulty,  and transferred to recovery in stable condition, able to move her upper and lower extremities.   It should be noted my assistant [facility], PA was present throughout  procedure, including the removal of the instrumentation, the exposure,  the decompression, the instrumentation, and the fusion.  She also  assisted with wound closure.      Sharolyn Douglas, M.D.  Electronically Signed     MC/MEDQ  D:  10/31/2006  T:  10/31/2006  Job:  [id number]

## 2010-12-29 NOTE — Op Note (Signed)
NAMEMarland Kitchen  Valerie Baker, Valerie Baker NO.:  [id number]   MEDICAL RECORD NO.:  [id number]          PATIENT TYPE:  INP   LOCATION:  2550                         FACILITY:  [facility]   PHYSICIAN:  Sharolyn Douglas, M.D.        DATE OF BIRTH:  1955-12-23   DATE OF PROCEDURE:  01/10/2006  DATE OF DISCHARGE:                                 OPERATIVE REPORT   DIAGNOSES:  1.  L4-5 spondylosis and spinal stenosis.  2.  History of previous L3-4 fusion with pseudoarthrosis and hardware.  3.  Chronic back and bilateral lower extremity pain.  4.  Post laminectomy syndrome.   PROCEDURE PERFORMED:  1.  Removal of instrumentation from L3-4 and exploration of posterior      arthrodesis.  2.  Revision L3-4 and L4-5 lumbar laminectomy with wide decompression of the      thecal sac and nerve roots bilaterally.  3.  Segmental pedicle screw instrumentation, L3-L5, using the Abbott spine      system.  4.  Posterior spinal fusion, L3-L5.  5.  Transforaminal lumbar interbody fusion, L4-5, with placement of 12-mm      PEEK prosthetic cage.  6.  Local autogenous bone graft supplemented with bone morphogenic protein      and 15 mL of graft on allograft.   SURGEON:  Sharolyn Douglas, M.D.   ASSISTANT:  Verlin Fester, P.A.   ANESTHESIA:  General endotracheal.   ESTIMATED BLOOD LOSS:  200 mL.   COMPLICATIONS:  None.   INDICATIONS:  The patient is a pleasant 55 year old female with progressive  back and bilateral lower extremity pain.  She has a history of previous L3-4  fusion with Ray cages and pedicle screws.  She has developed severe  subjacent spinal stenosis.  She has a history of chronic pain and has a  spinal cord stimulator.  The IPG is over her left hip.  Risk and benefits of  revision surgery to explore her L3-4 fusion and extend across the L4-L5  segment discussed with the patient.  She elected to proceed in hopes of  improving her symptoms. Risks and benefits were reviewed.   PROCEDURE:  The patient  was identified in holding area and taken to the  operating room.  She underwent general endotracheal anesthesia without  difficulty.  She was given prophylactic IV antibiotics.  Neuro monitoring  was established in the form of lower extremity SFVP using the  EMGs.  She  was carefully turned prone onto the Wilson frame.  All bony prominences  padded.  Face and eyes protected at times. Back prepped, draped in usual  sterile fashion.  Previous midline incision was utilized and extended  several inches distally.  Dissection was carefully carried through the scar  tissue with care taken not to interfere with the spinal cord stimulator  leads.  These were not directly identified.  Once the dissection was carried  deep to the fascia, subperiosteal exposure was carried out to the tips the  transverse processes of L5 bilaterally.  The previous instrumentation at L3-  4 was exposed and deep retractors placed.  We did use  the appropriate  screwdrivers to remove the previous pedicle screws at L3 and L4.  We removed  the rod.  The screws were backed out.  The screws in the L4 vertebral body  appeared to be loose.  We then examined the space between the L3 and L4  spinous processes while using Kocher clamps to apply force.  It appeared  that there was a small amount of motion still between the L3 and L4 levels.  We therefore decided to extend the fusion across the L3-4 segment as well.  The previous fusion mass was carefully exposed between L3 and L4.  It did  not appear to be completed on the right side.  On the left side, there did  appear to be a bridge of bone between L3 and L4.  We then turned our  attention to performing a revision laminectomy by removing the remaining  spinous process and lamina of L5.  The ligamentum flavum was removed  piecemeal.  We found severe spinal stenosis at the L4-5 segment.  This was  widely decompressed out flush with the pedicles.  We identified the L5 nerve  roots  and completed the decompression out towards the foramen.  We then  carried the dissection up towards the L4 segment, removing some residual  bone left from previous operation.  The epidural fibrosis was teased off of  the underlying lamina.  The L4 nerve roots were identified and appeared to  be scarred more so on the right side than left.  Neurolysis was  accomplished.  We then turned our attention to placing pedicle screws at L3  and L4 and L5 bilaterally.  We used an anatomic probing technique at L5.  Pedicle holes were started using an awl.  Steffe pedicle probe used to  cannulate the pedicles.  Pedicles were then tapped and 6.5 x 40-mm screws  were placed.  Bone quality was somewhat soft, but the purchase was good.  We  then placed a pedicle screw on the right side at L4, 6.5 x 40 mm.  We chose  to leave the screw out on the left side at L4 because it appeared to be  somewhat medial based on preoperative CT scan.  The 5.5 x 45 mm screws were  used bilaterally at L3.  Each pedicle screw was then stimulated using  triggered EMGs, and there were no deleterious changes.  We then turned our  attention to complete the transforaminal lumbar interbody fusion on the  right side at L4-5.  The remaining facette joint was osteotomized.  The  exiting and transversing nerve roots were identified and protected at all  times.  Sharp annulotomy was completed.  The disk space was dilated up to 13  mm.  Curved __________ curettes were used to scrape the cartilaginous  endplates and complete a diskectomy across the contralateral side.  The disk  space was then packed with BMP sponges along with local autogenous bone  graft from the laminectomy.  A 13-mm PEEK cage was packed with a BMP sponge,  carefully inserted into the interspace and tamped anteriorly and across the  midline.  Throughout the TLIF procedure, we monitored frequent EMGs, and there were no deleterious changes.  We then completed the  posterior spinal  arthrodesis by decorticating the transverse processes of L5 bilaterally and  the residual fusion mass between L3 and L4.  The remaining local bone graft  along with 15 mL of graft on allograft was packed tightly in the lateral  gutters.  The 80-mm titanium rods were placed into the polyaxial screw  heads, and the locking caps were applied.  Compression was applied before  shearing off the locking caps.  A cross connector was placed.  Hemostasis  was achieved.  The wound was irrigated.  Gelfoam left over the exposed  epidural space.  A deep Hemovac drain was left.  The deep fascia closed with  a running #1 Vicryl suture.  Subcutaneous layer closed with 0 Vicryl and 2-0  Vicryl followed by a running 3-0 subcuticular Vicryl suture on the skin  edges.  Benzoin and Steri-Strips were placed.  Sterile dressing applied.  The patient was turned supine, extubated without difficulty and transferred  to recovery in stable condition, able to move her upper and lower  extremities.      Sharolyn Douglas, M.D.  Electronically Signed     MC/MEDQ  D:  01/10/2006  T:  01/10/2006  Job:  [id number]

## 2010-12-29 NOTE — Op Note (Signed)
[place]  Patient:    Valerie Baker, MANGANELLI Visit Number: [id number] MRN: [id number]          Service Type: DSU Location: [facility] Attending Physician:  Alinda Deem Dictated by:   Alinda Deem, M.D. Proc. 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  PROCEDURES:  Left shoulder arthroscopic anterior inferior acromioplasty and distal clavicle excision.  SURGEON:  Alinda Deem, M.D.  ASSISTANT:  Dorthula Matas, P.A.-C.  ANESTHESIA:  General endotracheal.  ESTIMATED BLOOD LOSS:  Minimal.  FLUID REPLACEMENT:  800 cc crystalloid.  DRAINS:  None.  TOURNIQUET TIME:  None.  INDICATION FOR PROCEDURE:  The patient is a 55 year old woman who underwent a successful right shoulder decompression and distal clavicle excision by me some years ago.  Has similar pain pattern on the left side.  Has failed conservative treatment, including cortisone injections, which did not provide short-term or long-term relief; however, despite this she desires elective decompression of her left shoulder.  She has also been worked up with an MRI scan of the neck showing no compressive lesion on the left side as well as an MRI scan of the shoulder, which does show some tendinosis but no full-thickness rotator cuff tear.  DESCRIPTION OF PROCEDURE:  Patient identified by arm band and taken to the operating room at [facility] day surgery center, appropriate anesthetic monitor attached, and general endotracheal anesthesia induced with the patient in the supine position.  She was then placed in the beach chair position and the left upper extremity prepped and draped in the usual sterile fashion from the wrist to the  hemithorax.  The skin along the anterior, lateral, and posterior aspects of the acromion process was infiltrated with 2-3 cc of 0.5% Marcaine and epinephrine solution and then a #11 blade was used to make portals 1.5 cm anterior to the [facility] joint, lateral to the junction of the middle and posterior thirds of the acromion, and posterior to the posterolateral corner of the acromion process.  The inflow was placed anteriorly, the arthroscope laterally, and a 4.2 mm [place] sucker shaver posteriorly, allowing subacromial bursectomy and outlining of the subacromial spur.  Small bleeders were identified and cauterized with the underwater electrocautery, and the subacromial spur was then removed with the 4.5 mm hooded Vortex bur.  The distal clavicle was noted to be bare-bone in some areas consistent with [facility] joint arthritis, and we then removed the distal 1 cm of the clavicle starting out with the standard scope position initially and then completing the excision by bringing the scope in anteriorly, the water laterally, and an arthroscope posteriorly.  We then repositioned the arthroscope into the glenohumeral joint using the posterior portal with the inflow attached to the scope, and diagnostic arthroscopy revealed an intact labrum, a normal biceps tendon, subscap tendon, supraspinatus and infraspinatus tendons were intact with a slight amount of inflammation.  The articular cartilage of the glenohumeral joint was in excellent condition.  At this point the shoulder was washed out with normal saline solution and the arthroscopic instruments removed and a dressing of Xeroform, 4 x 4 dressing sponges, and paper tape applied.  The patient was then awakened  and taken to the recovery room without difficulty. Dictated by:   Alinda Deem, M.D. Attending Physician:  Alinda Deem DD:  07/16/01 TD:  07/16/01 Job: [id number]

## 2010-12-29 NOTE — Discharge Summary (Signed)
NAMEMarland Kitchen  Valerie Baker, Valerie Baker NO.:  [id number]   MEDICAL RECORD NO.:  [id number]          PATIENT TYPE:  INP   LOCATION:  5012                         FACILITY:  [facility]   PHYSICIAN:  Sharolyn Douglas, M.D.        DATE OF BIRTH:  1955-09-13   DATE OF ADMISSION:  01/10/2006  DATE OF DISCHARGE:  01/14/2006                                 DISCHARGE SUMMARY   ADMITTING DIAGNOSES:  1. Spinal stenosis L4/5, below fusion at L3/4.  2. Hypertension.  3. Asthma.  4. History of migraines.  5. History of anxiety and depression.  6. Postoperative hyponatremia.  7. Postoperative hypokalemia.   DISCHARGE DIAGNOSIS:  1. Status post revision posterior spinal fusion L4/5, doing well.  2. Postoperative blood loss anemia.  3. Postoperative hyperglycemia.   PROCEDURE:  On Jan 10, 2006 the patient was taken to the operation room for  revision of spinal fusion at L4/5.  Evaluation of fusion at L3/4.   SURGEON:  Sharolyn Douglas, M.D.  Assistant Verlin Fester, PA-C   ANESTHESIA:  General.   CONSULTS:  None.   LABS:  CBC with diff was within normal limits postoperatively.  H&H were  monitored.  Reached a low of 10.0 and 28.8 on January 13, 2006.  PT INR and PTT  preop normal.  Complete metabolic panel preop was normal except blood  glucose of 112.  Sodium was 134 and potassium of 3.3.  Basic metabolic panel  x2 days postoperatively showed a glucose of 110 and 121, otherwise normal.  A UA from Jan 03, 2006.  Blood typing from Jan 03, 2006 was A+.  Antibody  screen was negative.  UA from Jan 03, 2006 showed multiple bacterial types  present.  None predominant suggesting inappropriate collection.   X-RAYS:  From Jan 10, 2006 of the lumbar spine was used intra operatively  for localization.  X-rays from January 14, 2006 of the lumbar spine show screw  and rod fixation at L3/4.  No EKG seen on the chart.   BRIEF HISTORY:  The patient is a 55 year old female who has undergone  previous posterior spine fusion  procedure as well as spinal cord stimulator.  Had increasing pain, was found to have severe spinal stenosis adjacent to  her previous fusion.  Because of her failure to improve as well as the  severity of these findings, it was thought our best course of management  would be revision, decompression, and fusion at that level.  The risks and  benefits of the procedure have been discussed with the patient by Dr. Noel Gerold  as well as by myself.  She indicated understanding and opted to proceed with  surgery.   HOSPITAL COURSE:  On Jan 10, 2006, the patient was taken to the operating  room for the above-listed procedures.  She tolerated the procedure well  without any intraoperative complications.  Intraoperative blood loss was  approximately 250 mL.  She was transferred to the recovery room in stable  condition.   Postoperatively, routine orthopedic spine protocol was followed and she  progressed along very well.  On postoperative day two her  Hemovac drain was  discontinued without any difficulty.  Her Foley catheter was removed and her  PCA was discontinued.   The patient worked with physical therapy and occupational therapy on a daily  basis, progressive ambulation program, back precautions, as well as brace  used.  By January 14, 2006 she was independent and safe with all of the above.   By January 14, 2006 the patient was medically as well as orthopedically stable  and ready for discharge.   DISCHARGE PLAN:  The patient is a 55 year old female status post revision  posterior spinal fusion doing very well.  Activities, daily ambulation  program, brace on when she is out, daily dressing changes, keeping incision  dry until all drainage stops, back precautions, and no lifting greater than  five pounds.   FOLLOW UP:  Two weeks postoperatively with Dr. Noel Gerold.   MEDICATIONS ON DISCHARGE:  Percocet for pain, Robaxin for muscle spasm,  multivitamin daily, calcium daily, Colace twice daily, laxative  as needed.  Avoid NSAIDs.  Resume home medications.   DIET:  Regular home diet, as tolerated.   CONDITION ON DISCHARGE:  Stable, improved.   DISPOSITION:  The patient is being discharged to her home with family  assistance as well as home physical therapy and occupational therapy.      Verlin Fester, P.A.      Sharolyn Douglas, M.D.  Electronically Signed    CM/MEDQ  D:  03/06/2006  T:  03/06/2006  Job:  [id number]

## 2010-12-29 NOTE — Op Note (Signed)
   NAME:  Valerie Baker, Valerie Baker                     ACCOUNT NO.:  [id number]   MEDICAL RECORD NO.:  [id number]                   PATIENT TYPE:  AMB   LOCATION:  ENDO                                 FACILITY:  [facility]   PHYSICIAN:  John C. Madilyn Fireman, M.D.                 DATE OF BIRTH:  1956-07-15   DATE OF PROCEDURE:  08/27/2002  DATE OF DISCHARGE:                                 OPERATIVE REPORT   PROCEDURE:  Esophagogastroduodenoscopy.   INDICATIONS FOR PROCEDURE:  Reflux symptoms, refractory to double dose  proton pump inhibitor.  The procedure is to assess the anatomy of the upper  GI tract and evaluation of advisability of possible anti-reflux surgery.   DESCRIPTION OF PROCEDURE:  The patient was placed in the left lateral  decubitus position and placed on the pulse monitor, with continuous low-flow  oxygen delivered by nasal cannula.  She was sedated with 100 mcg IV fentanyl  and 8 mg IV Versed.  The Olympus video endoscope was advanced under direct  vision into the oropharynx and esophagus.  The esophagus was straight  proximally, but of  normal caliber with slight tortuosity distally.  However,  the squamocolumnar line is somewhat irregular at about  37 cm.  The GE junction did appear to be slightly patchulous, although no reflux was  seen during the procedure.  There was an estimated 1 cm in length hiatal  hernia distal to the squamocolumnar line.  There is no visible esophagitis,  ring or stricture.  The stomach was entered and a small amount of liquid  secretions were suctioned from the fundus.  Retroflex view of the cardia  confirmed a small hiatal hernia and was otherwise unremarkable.  The fundus,  body, antrum and pylorus all appeared normal.  The duodenum was entered and  both the bulb and second portion were well inspected, appeared to be within  normal limits.   The scope was then withdrawn and the patient returned to the recovery room  in stable condition.  She tolerated  the procedure well and there were no  immediate complications.   IMPRESSION:  Small hiatal hernia with irregular squamocolumnar line.   PLAN:  Will add Reglan to her medical regimen and discuss antireflux surgery  when she returns to the office.                                               John C. Madilyn Fireman, M.D.    JCH/MEDQ  D:  08/27/2002  T:  08/27/2002  Job:  [id number]   cc:   Gaynelle Cage  [address]  [place]  [place] [address]  Fax: [phone]

## 2010-12-29 NOTE — Op Note (Signed)
[place]  Patient:    Valerie Baker, Valerie Baker Visit Number: [id number] MRN: [id number]          Service Type: SUR Location: 3000 [id number] Attending Physician:  Emeterio Reeve Dictated by:   Payton Doughty, M.D. Proc. Date: 04/15/01 Admit Date:  04/15/2001                             Operative Report  PREOPERATIVE DIAGNOSIS:  Spondylosis and degenerative disk disease at L3-4.  POSTOPERATIVE DIAGNOSIS:  Spondylosis and degenerative disk disease at L3-4.  OPERATION: L3-4 laminectomy, diskectomy, posterior lumbar interbody fusion with Ray threaded fusion cage.  SURGEON:   Payton Doughty, M.D.  ASSISTANT:  Tanya Nones. Jeral Fruit, M.D.  SERVICE:  Neurosurgery.  ANESTHESIA:  General endotracheal anesthesia.  PREP:    In sterile manner and scrubbed with alcohol wipe.  COMPLICATIONS:  None.  INDICATIONS:  This is a 55 year old right-handed white lady with severe lumbar spondylosis at 3-4 and positive diskography.  DESCRIPTION OF PROCEDURE:  She was taken to the operating suite, intubated, and placed prone on the operating table. Following shave, prep, and drape in the usual sterile fashion, skin was infiltrated with 1% lidocaine with 4:100,000 epinephrine.  Skin was incised from the mid L5 to the bottom of L2, and the lamina of L3 and L4 were exposed bilaterally in the subperiosteal plane.  This was out of its 3-4 facet joint.  Intraoperative x-ray showed the mark under 4.  The pars interarticularis lamina and inferior facet of L3 and the superior facet of L4 were removed bilaterally.  Ligamentum flavum was removed.  The 3 root and the 4 root were both dissected free.  The right side of the 3 root was somewhat low lying.  Having completely decompressed the nerve roots, dissection was carried out at 3-4.  Once dissection was carried out, Ray threaded fusion cages were placed, 14 x 21 mm on the left side cage went in uneventfully.  On the right side,  it had to be free handed in because the low lying root would not allow access for the Tang retractor.  There was minimal stretching of the root in this fashion and no CSF leak.  Following completion of cage placement, intraoperative x-ray showed good placement of cages.  They were packed with bone graft harvested from the facet joints and cap.  The wound was irrigated.  Hemostasis assured.  The fascia was reapproximated with 0 Vicryl in interrupted fashion.  Subcutaneous tissues were reapproximated with 0 Vicryl in interrupted fashion.  Subcuticular tissues were reapproximated with 3-0 Vicryl in interrupted fashion.  The skin was closed with 3-0 nylon in running lock fashion.  Betadine and Telfa dressing was applied and made inclusive with OpSite.  The patient returned to the recovery room in good condition.Dictated by:   Payton Doughty, M.D.  Attending Physician:  Emeterio Reeve DD:  04/15/01 TD:  04/15/01 Job: [id number]

## 2010-12-29 NOTE — H&P (Signed)
[place]  Patient:    Valerie Baker, Valerie Baker Visit Number: [id number] MRN: [id number]          Service Type: Attending:  Payton Doughty, M.D. Dictated by:   Payton Doughty, M.D. Adm. Date:  04/15/01                           History and Physical  ADMITTING DIAGNOSIS:  Spondylosis, L3-4.  SERVICE:  Neurosurgery.  HISTORY OF PRESENT ILLNESS:  This is a 55 year old right-handed white female who I had seen several times in the past for low back pain.  She has had an MRI that demonstrated degenerative changes at 3-4 and 4-5.  She underwent epidural steroids which were reasonably helpful in the past but she did not want to continue to expose herself to steroids.  Discography was positive at L3-L4 and she is now admitted for a laminectomy, diskectomy and posterior lumbar interbody fusion.  MEDICAL HISTORY:  Her medical history is remarkable for hypertension for which she is on Norvasc 5 mg a day.  MEDICATIONS: 1. Norvasc 5 mg a day. 2. Premarin 1.25 mg a day. 3. Flexeril 10 mg once a day. 4. Naprosyn 500 mg twice a day.  ALLERGIES:  She has no allergies.  SURGICAL HISTORY:  Hysterectomy.  She had a broken right foot in the past and a broken right shoulder.  SOCIAL HISTORY:  She smokes one to two packs of cigarettes a day, does not drink alcohol and works for ______ .  FAMILY HISTORY:  Her mom is 75, in reasonable health with diabetes.  Her daddy is deceased, cause is not given.  REVIEW OF SYSTEMS:  Remarkable for sinus headache, hypertension, leg pain while walking, endometriosis, back pain, arm pain, leg pain and neck pain.    PHYSICAL EXAMINATION:  HEENT:  Within normal limits.  NECK:  She has good range of motion in her neck.  CHEST:  Diffuse crackles.  CARDIAC:  Regular rate and rhythm.  ABDOMEN:  Nontender with no hepatosplenomegaly.  EXTREMITIES:  Without clubbing or cyanosis.  GU:  Deferred.  PERIPHERAL PULSES:   Good.  NEUROLOGIC:  She is awake, alert and oriented.  Cranial nerves are intact. Motor exam shows 5/5 strength throughout the upper and lower extremities. There are no current sensory deficits.  She does report episodic numbness down the entire right leg.  Reflexes are 2 at the knees and 2 at the ankles.  Toes are downgoing bilaterally.  Straight leg raise is positive on the right for hip and upper thigh pain radiating down to the calf; on the left side, it radiates to the hip.  Deep tendon reflexes are absent at the right knee, 1 at the left, 2 at the ankles.  Toes are downgoing bilaterally.  IMAGING STUDY:  Discography was positive at 3-4 and she had annular tears and internal disruption.  CLINICAL IMPRESSION:  Lumbar spondylosis at L3-4 which has failed to respond to conservative measures.  The plan is for laminectomy, diskectomy and posterior lumbar interbody fusion.  The risks and benefits of this approach have been discussed with her and she wishes to proceed. Dictated by:   Payton Doughty, M.D. Attending:  Payton Doughty, M.D. DD:  04/15/01 TD:  04/15/01 Job: [id number]

## 2010-12-29 NOTE — Op Note (Signed)
NAME:  Valerie Baker, RUMER NO.:  [id number]   MEDICAL RECORD NO.:  [id number]          PATIENT TYPE:  AMB   LOCATION:  SDS                          FACILITY:  [facility]   PHYSICIAN:  Sharolyn Douglas, M.D.        DATE OF BIRTH:  [birth year]   DATE OF PROCEDURE:  07/10/2006  DATE OF DISCHARGE:  07/03/2006                               OPERATIVE REPORT   ADDENDUM:  It should be noted that my assistant Verlin Fester, PA, was  present throughout the procedure including during positioning, exposure,  testing and she also assisted with wound closure.      Sharolyn Douglas, M.D.  Electronically Signed     MC/MEDQ  D:  07/10/2006  T:  07/11/2006  Job:  [id number]

## 2010-12-29 NOTE — H&P (Signed)
[place]. [facility]  Patient:    Valerie Baker, Valerie Baker Visit Number: [id number] MRN: [id number]          Service Type: SUR Location: 3000 3033 [id number] Attending Physician:  Emeterio Reeve Dictated by:   Payton Doughty, M.D. Admit Date:  10/28/2001                           History and Physical  ADMITTING DIAGNOSIS:  Herniated disk at C5-C6.  SERVICE:  Neurosurgery.  HISTORY OF PRESENT ILLNESS:  This is a now 55 year old right-handed white girl who in September, after long evaluation, underwent an L3-4 fusion in the lumbar spine.  She did reasonably well, took a fall over Christmas and had increasing back pain, studied with an MRI and plain films which demonstrated no difficulty with her fusion but has developed a lot in the way of increasing pain down both shoulders into both arms.  It was worse with head motion.  MRI shows an extensive degenerative change at C5-6.  She is now admitted for an anterior cervical diskectomy and fusion at that level.  MEDICAL HISTORY:  Remarkable for hypertension for which she is on Norvasc 5 mg a day.  ALLERGIES:  She has no allergies.  MEDICATIONS: 1. Premarin 1.25 mg a day. 2. Flexeril 10 mg once a day.  SURGICAL HISTORY:  Remarkable for a hysterectomy.  She has had broken foot in the past and broken right shoulder.  SOCIAL HISTORY:  She smokes one to two packs of cigarettes a day and does not drink alcohol.  FAMILY HISTORY:  Mom is 65, in reasonable health with diabetes.  Daddy is deceased, whose cause was not given.  REVIEW OF SYSTEMS:  Remarkable for sinus headache, hypertension and leg pain while walking; endometriosis, back pain, arm pain and neck pain.  PHYSICAL EXAMINATION:  HEENT:  Within normal limits.  NECK:  She has good range of motion of her neck.  It is painful in all directions but particularly in extension and flexion.  CHEST:  Diffuse crackles.  CARDIAC:  Regular rate and  rhythm.  ABDOMEN:  Nontender with no hepatosplenomegaly.  EXTREMITIES:  Without clubbing or cyanosis.  GU:  Deferred.  PERIPHERAL PULSES:  Good.  NEUROLOGIC:  She is awake, alert and oriented.  Her cranial nerves are intact. Motor exam shows 5/5 strength throughout the upper and lower extremities. There is no Hoffmanns.  Reflexes are 1 at the biceps, absent at the triceps, 1 at the brachioradialis.  There is a very mild Lhermittes phenomenon.  LABORATORY AND ACCESSORY DATA:  MRI results have been reviewed above.  CLINICAL IMPRESSION:  Cervical spondylosis at C5-6 with intractable neck pain.  PLAN:  The plan is for an anterior cervical diskectomy and fusion at C5-C6. The risks and benefits of this approach have been discussed with her and she wishes to proceed. Dictated by:   Payton Doughty, M.D. Attending Physician:  Emeterio Reeve DD:  10/28/01 TD:  10/29/01 Job: [id number]

## 2010-12-29 NOTE — H&P (Signed)
NAMEMarland Kitchen  Valerie Baker, Valerie Baker NO.:  [id number]   MEDICAL RECORD NO.:  [id number]            PATIENT TYPE:   LOCATION:                                 FACILITY:   PHYSICIAN:  Sharolyn Douglas, M.D.        DATE OF BIRTH:  1956/05/04   DATE OF ADMISSION:  10/10/2006  DATE OF DISCHARGE:                              HISTORY & PHYSICAL   DATE OF ADMISSION:  Will be October 10, 2006 to [facility].   CHIEF COMPLAINT:  Is low back and right lower abdomen pain.   HISTORY OF PRESENT ILLNESS:  Patient is a 55 year old female who has had  several previous back surgeries including a lumbar fusion as well as  spinal cord stimulator.  Unfortunately, last fall she starting having  severe right lower abdominal pain.  It was thought to be non-spine  related.  She had full workup from her general practitioner.  There was  no cause identified.  She did have a MRI scan of her lumbar spine which  did show some degenerative changes above her previous fusion at L2-3.  Therapeutic and diagnostic injection was done above her previous fusion.  She had complete relief of her abdominal pain with the nerve block and  steroid injection.  Because of these findings, as well as the severe  pain, and failure to have consistent improvement with conservative  treatments, her best course of management was thought to be an L2-3  extension of her fusion and decompression.  Risks and benefits of the  surgery were discussed with the patient by Dr. Noel Gerold as well as myself.   ALLERGIES:  SULFA.   MEDICATIONS:  Norvasc, Zoloft, Trazodone, Percocet, Robaxin and  Singulair.   PAST MEDICAL HISTORY:  1. Asthma.  2. Hypertension.  3. Diverticulitis.   PAST SURGICAL HISTORY:  1. Right foot surgery.  2. Right elbow surgery.  3. Bilateral rotator cuff repair.  4. Reflux surgery.  5. Hysterectomy.  6. ACDF.  7. Lumbar surgery x4.   SOCIAL HISTORY:  Patient denies tobacco use, denies alcohol use.  She is  married.  Her family will be available to help her postoperatively.   FAMILY MEDICAL HISTORY:  Is noncontributory.   REVIEW OF SYSTEMS:  Patient denies fevers, chills, sweats or bleeding  tendencies.  CNS:  Denies blurred vision, double vision, seizures,  headaches, paralysis.  CARDIOVASCULAR:  Denies chest pain, angina,  orthopnea, claudication or palpitations.  PULMONARY:  Denies shortness  of breath, productive cough, hemoptysis.  GI:  Denies nausea and  vomiting, constipation, diarrhea, melena or bloody stool.  GU:  Denies  dysuria, hematuria, discharge.  MUSCULOSKELETAL:  As per HPI.   PHYSICAL EXAMINATION:  GENERAL APPEARANCE:  Patient is a 55 year old  white female who is alert and oriented, in no acute distress.  She is  well-nourished, well-groomed, appears stated age.  Pleasant and  cooperative to exam.  HEENT:  Head is normocephalic, atraumatic.  Pupils equal, round and  reactive to light.  Extraocular movements intact.  Nares are patent.  Pharynx is clear.  NECK:  Is soft to palpation.  No lymphadenopathy, thyromegaly or bruits  appreciated.  CHEST:  Is clear to auscultation bilaterally.  No rales, rhonchi,  stridor, wheeze or friction rubs.  BREAST:  Is not pertinent, not performed.  HEART:  S1, S2, regular rate and rhythm.  No murmurs, gallops or rubs  noted.  ABDOMEN:  Is soft to palpation, nontender, nondistended.  No  organomegaly noted.  GU:  Is not pertinent, not performed.  EXTREMITIES:  As per HPI.  SKIN:  Is intact without any lesions or rashes.   IMPRESSION:  1. Is adjacent segment degeneration, L2-3.  2. Asthma.  3. Hypertension.  4. Diverticulitis.   PLAN:  Admit to [facility] on October 10, 2006 for a revision  and posterior spinal fusion to extend up across to L2-3.  Surgery will  be done by Dr. Noel Gerold at [facility].      [place], P.A.      Sharolyn Douglas, M.D.  Electronically Signed    CM/MEDQ  D:  09/30/2006  T:   09/30/2006  Job:  [id number]

## 2010-12-29 NOTE — Discharge Summary (Signed)
[place]. [facility]  Patient:    Valerie Baker, Valerie Baker Visit Number: [id number] MRN: [id number]          Service Type: SUR Location: 3000 3033 01 Attending Physician:  Emeterio Reeve Dictated by:   Payton Doughty, M.D. Admit Date:  10/28/2001 Discharge Date: 11/01/2001                             Discharge Summary  ADMISSION DIAGNOSIS:  Herniated disc C5-6.  DISCHARGE DIAGNOSIS:  Herniated disc C5-6.  OPERATIVE PROCEDURE:  A C5-6 anterior cervical discectomy and fusion.  SERVICES:  Neurosurgery.  COMPLICATIONS:  Nausea and vomiting.  HISTORY OF PRESENT ILLNESS:  This is a 55 year old right-handed white female whose history and physical is recounted in the chart. She has had a lumbar fusion and did well.  She had a fall over Christmas, had increased back pain and also developed pain in her neck.  MR of her cervical spine showed extensive degenerative change at C5-6 and she was admitted for fusion.  PAST MEDICAL HISTORY:  This is remarkable for hypertension.  MEDICATIONS:  She is on Norvasc.  ALLERGIES:  No known drug allergies.  PHYSICAL EXAMINATION:  General exam was unremarkable.  Neurologic exam was intact with back pain.  The lower extremity and upper extremity strength was full.  She has pain with head movement.  HOSPITAL COURSE:  She was admitted after ascertainment of normal laboratory values and underwent a C5-6 anterior cervical discectomy and fusion. Postoperatively she has done well.  Her incision is dry and well-healing.  Her strength is full.  She, however, developed nausea and vomiting.  It was not associated with taking her medications.  IV was restarted and she was left on that for a couple of days. Phenergan seemed to help some but gradually she got better on her own.  It was later discovered that several children at home had a similar illness and it was felt that this may represent gastroenteritis. Currently, she is awake,  alert and oriented.  Her strength is full and her incision is dry. She is being discharged home in the care of her family.  DISCHARGE MEDICATION:  Darvocet for pain.  FOLLOW-UP:  This will be in the [facility] office in about 10 days with a lateral C-spine x-ray. Dictated by:   Payton Doughty, M.D. Attending Physician:  Emeterio Reeve DD:  11/01/01 TD:  11/03/01 Job: [phone] [id number]

## 2010-12-29 NOTE — Assessment & Plan Note (Signed)
[facility]                           GASTROENTEROLOGY OFFICE NOTE   Valerie Baker, Valerie Baker                  MRN:          [id number]  DATE:05/27/2006                            DOB:          1956-08-03    REFERRING PHYSICIAN:  Dr. Molly Maduro Day.   REASON FOR REFERRAL:  History of GERD and new onset constipation.   HISTORY OF PRESENT ILLNESS:  A 55 year old female referred by Dr. Molly Maduro Day  with a history of GERD.  She is status post Nissen fundoplication by Dr.  Derrell Lolling in approximately 2002.  Her reflux symptoms have generally been under  very good control; however, recently she has had problems with mid sternal  pain and constant nausea.  These symptoms are occasionally worsened by  meals.  She has had gas, bloating, and constipation for the past few months.  She previously had a long term history of diarrhea.  She notes occasional  mild mid abdominal, crampy abdominal pain.  She relates a history of  external hemorrhoids.  She notes about a 20 pound weight loss over the past  two months, which has been intentional.  She notes no melena, hematochezia,  or change in stool caliber.   PAST MEDICAL HISTORY:  1. Hypertension.  2. Asthma.  3. History of migraine headaches.  4. Depression.  5. Anxiety.  6. Arthritis.  7. Status post sinus surgery.  8. Status post right tennis elbow surgery.  9. Status post back surgery in 2000.  10.Status post L4-5 lumbar fusion in May, 2007.  11.Status post permanent spinal cord stimulator placement in 2005.  12.Status post lumbar fusion in 2003.  13.Status post bilateral shoulder spur and rotator cuff surgeries in 2003      and in 1993.   CURRENT MEDICATIONS:  Listed on the chart, updated, and reviewed.   MEDICATION ALLERGIES:  SULFA drugs, leading to hives.   SOCIAL HISTORY:  Per the handwritten form.   REVIEW OF SYSTEMS:  Per the handwritten form.   PHYSICAL EXAMINATION:  GENERAL:  Obese.  No acute  distress.  VITAL SIGNS:  Height 5 feet, 4 inches.  Weight 220.2 pounds.  Blood pressure  124/90, pulse 88 and regular.  HEENT:  Anicteric sclerae.  Oropharynx clear.  CHEST:  Clear to auscultation bilaterally.  CARDIAC:  Regular rate and rhythm without murmurs appreciated.  ABDOMEN:  Soft, large, nondistended.  Mild lower abdominal tenderness to  deep palpation.  No rebound or guarding.  No palpable organomegaly, masses,  or hernias.  Normoactive bowel sounds.  RECTAL:  Deferred to the time of colonoscopy.  EXTREMITIES:  Without clubbing, cyanosis or edema.  NEUROLOGIC:  Alert and oriented x3.  Grossly nonfocal.   ASSESSMENT/PLAN:  Rule out nonsteroidal-induced gastritis or ulcer disease.  Rule out cholelithiasis.  Rule out occult intestinal lesions.  Rule out  medication side effects leading to constipation, nausea, and crampy  abdominal discomfort.  Add Prevacid 30 mg p.o. q.a.m. for the next 2-4  weeks.  Increase MiraLax from b.i.d. to t.i.d.  Risks, benefits and  alternatives to colonoscopy with possible biopsy and possible polypectomy  and upper endoscopy with  possible biopsy discussed with the patient, and she  consents to proceed.  These will be scheduled electively.     Venita Lick. Russella Dar, MD, [facility]  Electronically Signed    MTS/MedQ  DD: 06/11/2006  DT: 06/12/2006  Job #: [id number]

## 2010-12-29 NOTE — Op Note (Signed)
NAMEMarland Kitchen  IKEA, DEMICCO NO.:  [id number]   MEDICAL RECORD NO.:  [id number]          PATIENT TYPE:  AMB   LOCATION:  NESC                         FACILITY:  [facility]   PHYSICIAN:  Sharolyn Douglas, M.D.        DATE OF BIRTH:  May 24, 1956   DATE OF PROCEDURE:  12/03/2005  DATE OF DISCHARGE:                                 OPERATIVE REPORT   DIAGNOSIS:  Right sacroiliac joint pain.   PROCEDURES:  1.  Right SI joint injection.  2.  Fluoroscopic guidance used for right SI joint injection.   SURGEON:  Sharolyn Douglas, M.D.   ASSISTANT:  None.   ANESTHESIA:  MAC plus local.   COMPLICATIONS:  None.   ESTIMATED BLOOD LOSS:  None.   INDICATIONS:  The patient is a pleasant lady with persistent right hip pain  felt to be secondary to SI joint dysfunction.  She now elects to undergo  right SI joint injection for further diagnostic and therapeutic purposes.  Risks and benefits were reviewed.   PROCEDURE:  The patient was identified in the holding area and taken to the  operating room.  She underwent sedation by the anesthesia department.  She  was carefully positioned prone, all bony prominences padded, face and eyes  protected.  The right SI joint was prepped and draped in the usual sterile  fashion.  Fluoroscopy was brought into the field, and a true image of the  joint was obtained by tilting the fluoroscopy unit.  She skin overlying the  posterior inferior corner of the joint was anesthetized with 1% lidocaine  and epinephrine.  A 22-gauge spinal needle was then advanced into the joint  under fluoroscopic visualization.  1 cc of Omnipaque injected, and there  appeared to be a good intraarticular uptake.  Aspiration was performed.  There was no blood.  2 cc of lidocaine and 40 mg of Depo-Medrol were  injected into the joint.  The spinal needle was removed.  A Band-Aid was  placed over the puncture site.  She was turned supine and transferred to  recovery in stable condition.  She  will be assessed before discharge for  initial improvement in her pain with the local anesthetic.      Sharolyn Douglas, M.D.  Electronically Signed     MC/MEDQ  D:  12/03/2005  T:  12/04/2005  Job:  [id number]

## 2010-12-29 NOTE — H&P (Signed)
NAME:  Valerie Baker, [name]                     ACCOUNT NO.:  [id number]   MEDICAL RECORD NO.:  [id number]                   PATIENT TYPE:  OIB   LOCATION:                                       FACILITY:  [facility]   PHYSICIAN:  Sharolyn Douglas, M.D.                     DATE OF BIRTH:  [birth year]   DATE OF ADMISSION:  03/02/2004  DATE OF DISCHARGE:                                HISTORY & PHYSICAL   CHIEF COMPLAINT:  Pain in my back and legs, more so on the left.   HISTORY OF PRESENT ILLNESS:  This 55 year old white female who has been seen  by [place] for continuing and progressive problems concerning pain in her back,  with pain in the lower extremities.  She has a history of chronic back and  bilateral lower extremity pain for a considerable amount of time now.  She  has had a surgical procedure with Ray cage fusion at L4-5 by Dr. Payton Doughty  in the past.  She has had the subsequent development of pseudoarthrosis at  this level, and Dr. Sharolyn Douglas went in and performed an L3-4 pseudolateral  arthrodesis with pedicle screw instrumentation, utilizing a Monarch system  with a left posterior iliac crest bone graft.  Unfortunately the patient  continued with residual pain and discomfort in the lower extremities.  The  patient had been treated by Dr. Ethelene Hal for her pain.  It was felt that the  patient would benefit with a spinal cord stimulator.  A trial was done by  Dr. Ethelene Hal, with good results, particularly with the patient walking,  reducing her right leg pain.  The company representatives have been  contacted.  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  She  was cleared preoperatively by a psychiatrist.  She is a good candidate for  this procedure.  She has also been cleared medically by Dr. Gerlene Burdock Day, her  family physician at [facility].  Dr. Noel Gerold has discussed the procedure, the  expected perioperative hospital  course, and the complications of the surgery with the patient.  She has  decided to go ahead with the spinal cord stimulator.   PAST SURGICAL HISTORY:  1. The back surgery as mentioned above.  2. She has also had a neck fusion in March 2003.  3. Other surgery include an appendectomy in 1975.  4. A partial hysterectomy in 1984.  5. A complete hysterectomy in 1992.  6. A right foot surgery in 1994.  7. A right shoulder surgery in 1997.  8. The first back fusion was in September 2002.  9. Left shoulder surgery in December 2002.  10.      Sinus surgery in March 2003.  11.      Right elbow surgery in April 2003.  12.  Dr. Wells Guiles surgery with reinforcement of the previous cage fusion,     secondary to a pseudoarthrosis in July 2003.  13.      Sinus surgery again in October 2003.  14.      Reflux surgery done in March 2004.  15.      A renal calculi in March 2004.   PAST MEDICAL HISTORY:  1. Migraines.  2. Some anxiety and depression.  3. Asthmatic bronchitis.  Her last problem was in 2004.  4. She had pneumonia in 1999.  5. Hypertension.   CURRENT MEDICATIONS:  1. Norvasc 10 mg, one daily.  2. Zoloft 100 mg, one daily.  3. Singulair 10 mg, one daily.  4. Mobic 7.5 mg, one b.i.d.  5. Hydrocodone for discomfort.  6. Skelaxin as a muscle relaxant.   FAMILY PHYSICIAN:  Dr. Gerlene Burdock Day is her family physician in [facility].   ALLERGIES:  No known drug allergies.   FAMILY HISTORY:  Positive for hypertension and diabetes, as well as a stroke  in her father who is now deceased.   SOCIAL HISTORY:  The patient is married.  She is disabled.  She has no  intake of tobacco or alcohol products.  She has three children.  Her  caregivers after surgery will be her husband, son and a daughter-in-law.   REVIEW OF SYSTEMS:  CNS:  No seizures or paralysis.  No double vision.  The  patient does have a radiculitis and a  radiculopathy, consistent with chronic  and recurrent nerve root irritation, as seen in the history of present  illness.  CARDIOVASCULAR:  No chest pain, no angina or orthopnea.  RESPIRATORY:  No productive cough, no hemoptysis, no shortness of breath.  GASTROINTESTINAL:  No nausea, vomiting, melena, or bloody stool.  GENITOURINARY:  No discharge or hematuria.  MUSCULOSKELETAL:  Primarily in  the history of present illness.   PHYSICAL EXAMINATION:  GENERAL:  Alert and cooperative and friendly 47-year-  old white female, who walks carefully during the examination; however, she  has no limp.  She has no difficulty coming from the seated to a standing  position.  VITAL SIGNS:  Blood pressure 146/82, pulse 80, respirations 12.  HEENT:  Normocephalic.  Pupils equal, round, reactive to light and  accommodation.  Oropharynx clear.  CHEST:  Clear to auscultation.  No rhonchi, no rales.  HEART:  A regular rate and rhythm.  No murmurs are heard.  ABDOMEN:  Obese, nontender.  Liver and spleen are not felt.  GENITALIA/RECTAL/PELVIC/BREASTS:  Not done, not pertinent to the present  illness.  EXTREMITIES:  The patient has a negative straight leg raising bilaterally.  She is noted to have a decreased sensation to light touch in the lower  extremities; however, this is stocking/glove-like.  Give-away weakness is  noted on muscle examination, secondary to pain.   ADMISSION DIAGNOSES:  1. Post-laminectomy syndrome.  2. Hypertension.  3. Asthma.  4. Fibromyalgia.   PLAN:  The patient will undergo a spinal cord stimulator.      Dooley L. Cherlynn June.                 Sharolyn Douglas, M.D.    DLU/MEDQ  D:  02/22/2004  T:  02/22/2004  Job:  [id number]   cc:   Oletta Lamas, M.D.  [facility]  [phone] [address]  [place], [place] [address]  FAX:  [phone]

## 2010-12-29 NOTE — Discharge Summary (Signed)
NAMEMarland Kitchen  Valerie Baker, Valerie Baker NO.:  [id number]   MEDICAL RECORD NO.:  [id number]          PATIENT TYPE:  INP   LOCATION:  3012                         FACILITY:  [facility]   PHYSICIAN:  Sharolyn Douglas, M.D.        DATE OF BIRTH:  07-06-1956   DATE OF ADMISSION:  10/31/2006  DATE OF DISCHARGE:  11/03/2006                               DISCHARGE SUMMARY   ADMITTING DIAGNOSES:  1. 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  2. Asthma.  3. Hypertension.  4. Diverticulitis.  5. Postoperative blood loss anemia.   DISCHARGE DIAGNOSES:  1. Status post revision posterior spinal fusion L2-3.  2. Postoperative blood loss anemia.   PROCEDURE:  On October 31, 2006, patient was taken to the operating room  for L2-3 revision decompression and posterior spinal fusion.   SURGEON:  Max Noel Gerold.   ASSISTANT:  [employer], PA-C.   ANESTHESIA:  General.   CONSULTS:  None.   LABS:  Preoperative CBC with diff was within normal limits with the  exception of absolute lymphs of 3.5.  H&H monitored postoperatively  reached a level of 9.4 and 26.5 on November 02, 2006.  PT/INR and PTT preop  normal.  Complete metabolic panel, preop, was normal with the exception  of a potassium of 3.2 and an ALT of 157.  Postoperatively, basic  metabolic panel x3 days showed a glucose of 112 on postop day 1 and a  glucose of 100 on postop day 3; otherwise, normal.  UA preop was  negative.  Blood typing was A positive.  Antibody screens negative.  Urine cultures with no growth.  EKG from October 28, 2006, shows normal  sinus rhythm, inferior infarct age undetermined, possible anterior  infarct age undetermined.  When compared to EKG of Jan 03, 2006, more  suggestive of inferior infarct and anterior infarct, read by Aggie Cosier, MD.  X-rays of the chest, on March 17, showed no active disease.  March 20, lumbar spine x-rays were used postoperatively for confirmed  screw placement.  March 23, x-rays of the lumbar spine show  L2-L5  fusion.  No complicating features.   BRIEF HISTORY:  Patient is a 55 year old female who is very well known  to Dr. Noel Gerold.  She has had previous lumbar fusions and done well through  her postoperative course.  Unfortunately, several months back she  developed severe right lower abdominal pain.  She had a full workup from  an abdominal and pelvis standpoint, her medical doctors could not  determine the cause and therefore sent her to [place] to determine if it  could possibly be related to her lumbar spine.  An MRI scan did reveal  an HNP and a pinched nerve that could be contributing to lower abdomen  and upper thigh pain on the right side.  Risks and benefits of the  procedure were discussed with the patient at length.  She indicates good  understanding and opts to proceed.   HOSPITAL COURSE:  On October 31, 2006, patient was admitted to the  hospital and taken to the operating room for the above-listed procedure.  She tolerated  the procedure well without any intraoperative  complications and she was transferred to the recovery room in stable  condition.   Postoperatively, a routine orthopedic spine protocol was followed and  she progressed along very well.  She did not develop any significant  postoperative complications.   Physical therapy and occupational therapy worked with her on a daily  basis on brace use and back precautions and a progressive ambulation  program.  She was then advanced safe prior to discharge.  By November 03, 2006, patient had met all orthopedic goals.  She was medically stable  and ready for discharge home.   DISCHARGE PLAN:  Patient a 55 year old female, status post revision of  posterior spinal fusion L2-3 doing well.   ACTIVITY:  Daily ambulation, brace on when she is up, back precautions  at all times, no lifting greater than 5 pounds.  Patient may shower.   FOLLOWUP:  Two weeks postoperatively with Dr. Noel Gerold.   DIET:  Regular home diet as  tolerated.   CONDITION ON DISCHARGE:  Stable, improved.   DISPOSITION:  Patient being discharged to her home with her family's  assistance, as well as home health, physical therapy and occupational  therapy.   DISCHARGE MEDICATIONS:  1. Vicodin for pain.  2. Flexeril for muscle spasms.  3. Multivitamin daily.  4. Calcium daily.  5. Avoid NSAIDs x3 months.  6. Colace 100 mg twice daily.  7. Laxative as needed.      Verlin Fester, P.A.      Sharolyn Douglas, M.D.  Electronically Signed    CM/MEDQ  D:  01/09/2007  T:  01/09/2007  Job:  [id number]

## 2010-12-29 NOTE — Op Note (Signed)
NAME:  Valerie Baker, Valerie Baker                     ACCOUNT NO.:  [id number]   MEDICAL RECORD NO.:  [id number]                   PATIENT TYPE:  AMB   LOCATION:  NESC                                 FACILITY:  [facility]   PHYSICIAN:  Excell Seltzer. Annabell Howells, M.D.                 DATE OF BIRTH:  July 19, 1956   DATE OF PROCEDURE:  02/18/2003  DATE OF DISCHARGE:                                 OPERATIVE REPORT   OPERATION/PROCEDURE:  Cystoscopy with right ureteroscopic stone extraction  with holmium laser tripsy and insertion of left double-J stent.   PREOPERATIVE DIAGNOSIS:  Right ureteral stone.   POSTOPERATIVE DIAGNOSIS:  Right ureteral stone.   SURGEON:  Excell Seltzer. Annabell Howells, M.D.   ANESTHESIA:  General.   DRAINS:  6-French 24 cm double-J stent.   SPECIMENS:  Stone fragments.   COMPLICATIONS:  None.   INDICATIONS:  Mrs. Timoney is a 55 year old white female with a large  right distal ureteral stone that measures 8 x 5 mm.  We had discussed the  treatment options in the office including lithotripsy and ureteroscopy.  Initially it was felt that she desired lithotripsy but upon arrival to the  surgical center today, she said that she had actually intended to have  ureteroscopy.  Fortunately the schedule was open and we were able to prepare  for that.  She had been notified of the risks.   DESCRIPTION OF PROCEDURE:  The patient was given p.o. antibiotics.  She was  taken to the operating room where general anesthetic was induced.  She was  placed in the lithotomy position.  Her perineum and genitalia were prepped  with Betadine solution.  She was draped in the usual sterile fashion.  Cystoscopy was performed in the usual fashion with a 22-French scope and the  12-degree lens.  Urethra the unremarkable.  The bladder wall was smooth and  pale without tumors or inflammation.  The ureteral orifices were  unremarkable.  The cystoscope was removed and the 6-French short  ureteroscope was passed per  urethra.  I was able to negotiate this up the  right ureteral orifice without difficulty.  The stone was visualized and an  initially attempt was made to engage it with the laser.  However, the stone  wanted to move back in a retrograde fashion, so I then passed a Nytnol stone  basket and engaged the stone.  I brought it down further in the ureter.  I  could not remove it intact so the end of the basket was cut and the  ureteroscope was removed off the basket.  The ureteroscope was then inserted  alongside the basket and with the stone entrapped, it was engaged with the  holmium laser and fragmented.  Once the stone had been appropriately  fragmented, the basket disengaged.  I then passed the guide wire through the  ureteroscope up to the kidney for safety purposes and then made several  passes with a second Nytnol basket to remove fragments.  Some of the  fragments were too small to engage the basket but were felt to be  sufficiently small to pass.  I was then able to pass the ureteroscope well  into the mid and lower proximal ureter without finding any large residual  fragments.  At this point the ureteroscope was removed.  The cystoscope was  reinserted.  The stone fragments were evacuated from the bladder.  A 6-  French, 24 cm double-J stent was then inserted over the guide wire to the  kidney under fluoroscopic and visual guidance.  Once in good position, the  wire was removed leaving a good coil in the kidney, good coil in the  bladder.  The bladder was drained and cystoscope was removed.  The stent  string was tied near the urethral meatus and cut short.  The patient was  taken down from lithotomy position and anesthetic and she was removed to the  recovery room in stable condition.  There were no complications.                                               Excell Seltzer. Annabell Howells, M.D.    JJW/MEDQ  D:  02/18/2003  T:  02/18/2003  Job:  [id number]   cc:   Gaynelle Cage, MD  [phone]  [place]  [place] [address]  Fax: [phone]

## 2010-12-29 NOTE — Op Note (Signed)
[place]  Patient:    Valerie Baker, Valerie Baker Visit Number: [id number] MRN: [id number]          Service Type: SUR Location: 3000 [id number] [id number] Attending Physician:  Emeterio Reeve Dictated by:   Payton Doughty, M.D. Proc. Date: 10/28/01 Admit Date:  10/28/2001                             Operative Report  PREOPERATIVE DIAGNOSIS:  Herniated disk at C5-C6.  POSTOPERATIVE DIAGNOSIS:  Herniated disk at C5-C6.  OPERATION PERFORMED:  C5-C6 anterior cervical diskectomy and fusion with tether plate.  SURGEON:  Payton Doughty, M.D.  COMPLICATIONS:  None.  ASSISTANT: 1. Cristi Loron, M.D. 2. Webb.  ANESTHESIA:  General endotracheal.  PREP:  Sterile Betadine prep and scrub with alcohol wipe.  DESCRIPTION OF PROCEDURE:  The patient is a 55 year old, right-handed white female with herniated disk at C5-C6.  The patient was taken to the operating room, smoothly anesthetized and intubated, and placed supine in halter head traction with the neck extended.  Following shave, prep and drape in the usual sterile fashion the skin was incised from the midline to the medial border of the sternocleidomastoid muscle on the left side.  The platysma was identified, elevated and divided and undermined.  The sternocleidomastoid was identified and medial dissection revealed the carotid artery retracted laterally to the left, trachea and esophagus retracted laterally to the right, exposing the bones of the anterior cervical spine.  Marker was placed, intraoperative x-ray obtained.  The marker was found to be at 4-5.  Diskectomy was carried out at the level below that.  The longus colli was taken down in the immediate are of the disk space.  Diskectomy was carried out at 5-6 under gross observation. The operating microscope was then put in, the self-retaining disk spreader placed and microdissection technique used to complete removal of the disk. Dissection of the  anterior epidural space and removal of posterior longitudinal ligament.  Having completed this removal, the neural foramina were explored.  On the right side there was a herniated disk demonstrated into the C6 neural foramen.  This was removed without difficulty.  The left side was relatively free.  The wound was irrigated and hemostasis was assured.  A 7 mm bone graft was fashioned from patellar allograft and tapped into place, a 14 mm tether plate was then placed with 12 mm screws, two in C5 and two in C6. Intraoperative x-ray showed good placement of bone graft, plate and screws. Hemostasis once again assured.  The platysma was reapproximated with 3-0 Vicryl in interrupted fashion.  Subcutaneous tissues reapproximated with 3-0 Vicryl suture in interrupted fashion. The skin was closed with 4-0 Vicryl in running subcuticular fashion.  Benzoin and Steri-Strips were placed and made occlusive with Telfa and Op-Site.  The patient was then placed in Aspen collar and returned to the recovery room in good condition. Dictated by:   Payton Doughty, M.D. Attending Physician:  Emeterio Reeve DD:  10/28/01 TD:  10/29/01 Job: [id number]

## 2010-12-29 NOTE — Op Note (Signed)
   NAME:  Valerie Baker, Valerie Baker                     ACCOUNT NO.:  [id number]   MEDICAL RECORD NO.:  [id number]                   PATIENT TYPE:  AMB   LOCATION:  ENDO                                 FACILITY:  [facility]   PHYSICIAN:  John C. Madilyn Fireman, M.D.                 DATE OF BIRTH:  1955/09/28   DATE OF PROCEDURE:  10/06/2002  DATE OF DISCHARGE:  10/06/2002                                 OPERATIVE REPORT   PROCEDURE:  Esophageal motility study.   INDICATIONS FOR PROCEDURE:  Gastroesophageal reflux, under consideration for  antireflux surgery.   RESULTS:  A. Upper esophageal sphincter:  Not evaluated.  B. Esophageal body:  Totally normal velocity, amplitude, and duration with     100% peristalsis to ten administered wet swallows.  C. Lower esophageal sphincter:  LES pressure 22.7 mm which is normal.     Relaxation is 71% which is slightly abnormal with normal being 75-100%.   IMPRESSION:  1. Essentially normal study with slight decrease in the lower esophageal     sphincter relaxation.  2. No contraindication to antireflux surgery but consider loose wrap or     Toupee procedure.                                               John C. Madilyn Fireman, M.D.    JCH/MEDQ  D:  10/20/2002  T:  10/20/2002  Job:  [id number]   cc:   Gaynelle Cage  [address]  [place]  [place] [address]  Fax: [phone]   Angelia Mould. Derrell Lolling, M.D.  [address], [address]  [place]  [place] [address]  Fax: [phone]

## 2010-12-29 NOTE — Op Note (Signed)
NAME:  Valerie Baker, Valerie Baker NO.:  [id number]   MEDICAL RECORD NO.:  [id number]          PATIENT TYPE:  AMB   LOCATION:  DSC                          FACILITY:  [facility]   PHYSICIAN:  Sharolyn Douglas, M.D.        DATE OF BIRTH:  Oct 25, 1955   DATE OF PROCEDURE:  08/08/2006  DATE OF DISCHARGE:                               OPERATIVE REPORT   DIAGNOSIS:  Right thigh and abdominal pain, with underlying lumbar  degenerative disk disease above previous two-level fusion.   PROCEDURES:  1. Right L3 selective nerve root block.  2. Fluoroscopic imaging used for nerve root injection.   SURGEON:  Sharolyn Douglas, M.D.   ASSISTANT:  None.   ANESTHESIA:  MAC plus local.   COMPLICATIONS:  None.   INDICATIONS:  The patient is a pleasant, 55 year old female with  persistent right groin and abdominal pain.  She has had a previous 2-  level lumbar infusion from L4 to S1.  She now presents for diagnostic  and potentially therapeutic right L3 selective nerve root block.  The  risks, benefits and alternatives were reviewed.   DESCRIPTION OF PROCEDURES:  After informed consent, she was taken to the  operating room and underwent sedation by Anesthesia, turned prone and  her back prepped and draped in the usual sterile fashion.  Fluoroscopy  was brought into the field and oblique imaging was obtained of the L3-4  segment.  A 22-gauge Quincke spinal needle was then advanced underneath  the L3 pedicle on the right side.  Aspiration showed no blood and no  CSF.  A small amount of Omnipaque was injected, which showed some spread  along the nerve root.  We then injected a solution of 80 mg of Depo-  Medrol and 1% preservative-free lidocaine 4 cc.  The patient tolerated  the procedure well.  There were no apparent complications.  During the  injection, she experienced some concordant pain in the right groin.  As  the injection was given, the pain resolved.  She was transferred to  recovery in stable  condition.  In the holding area, she was comfortable.  She had no pain.  I reviewed post-injection instructions with her, and  she will follow up in 2 weeks.      Sharolyn Douglas, M.D.  Electronically Signed     MC/MEDQ  D:  08/08/2006  T:  08/08/2006  Job:  [id number]

## 2010-12-29 NOTE — Discharge Summary (Signed)
[place]. [facility]  Patient:    Valerie Baker, Valerie Baker Visit Number: [id number] MRN: [id number]          Service Type: SUR Location: 3000 3007 01 Attending Physician:  Emeterio Reeve Dictated by:   Hewitt Shorts, M.D. Admit Date:  04/15/2001 Discharge Date: 04/18/2001                             Discharge Summary  HISTORY OF PRESENT ILLNESS:  The patient is a 55 year old woman who is a patient of Dr. Rolan Bucco with low back pain.  She had a positive discogram and she was admitted for a laminectomy, diskectomy, and PLIF.  PHYSICAL EXAMINATION:  GENERAL:  Unremarkable.  NEUROLOGIC EXAM:  Intact strength and sensation.  HOSPITAL COURSE:  The patient was admitted and underwent an L3-4 PLIF. Following surgery, she has done well, she was seen by physical therapy and occupational therapy.  She is being discharged home with instruction regarding wound care and activities.  She was switched over to Vicodin 1-2 tablets p.o. q.4-6h. p.r.n. pain, 40 tabs prescribed, no refills.  She was also advised to use Aleve 2 tablets b.i.d.  She is to return in about a week for suture removal with Dr. Channing Mutters.  DISCHARGE DIAGNOSES: 1. Lumbar spondylosis. 2. Degenerative disk disease. Dictated by:   Hewitt Shorts, M.D. Attending Physician:  Emeterio Reeve DD:  04/18/01 TD:  04/18/01 Job: [id number]

## 2010-12-29 NOTE — Op Note (Signed)
NAMEMarland Kitchen  Valerie Baker, AN NO.:  [id number]   MEDICAL RECORD NO.:  [id number]          PATIENT TYPE:  AMB   LOCATION:  SDS                          FACILITY:  [facility]   PHYSICIAN:  Sharolyn Douglas, M.D.        DATE OF BIRTH:  Apr 29, 1956   DATE OF PROCEDURE:  07/10/2006  DATE OF DISCHARGE:                               OPERATIVE REPORT   DIAGNOSIS:  Malfunctioning spinal cord stimulator.   PROCEDURE:  Revision of a IPG right hip and testing.   SURGEON:  Sharolyn Douglas, MD.   ASSISTANTJill Side Mahar, PA   ANESTHESIA:  General endotracheal.   ESTIMATED BLOOD LOSS:  Minimal.   COMPLICATIONS:  None.   NEEDLE AND SPONGE COUNT:  Correct.   INDICATIONS:  The patient is a pleasant 55 year old female with chronic  pain secondary to multiple back surgeries.  She has had a spinal cord  stimulator with good coverage.  Recently her battery has been failing  and she now presents for revision.  Risks, benefits, alternatives were  reviewed.   PROCEDURE:  The patient was identified in the holding area, after  informed consent taken to the operating room underwent general  endotracheal anesthesia without difficulty given prophylactic IV  antibiotics.  Carefully turned prone onto the Wilson frame.  All bony  prominences padded.  Face eyes protected at all times.  Back prepped,  draped usual sterile fashion.  The previous incision was opened over the  right buttock.  Great care was taken not to damage the stimulator leads.  The IPG was identified and found to be well encapsulated.  The capsule  was incised and the IPG removed.  The stimulator wires were disconnected  using the appropriate screwdriver.  The leads appeared to be intact.  We  then connected a new Eon IPG.  The leads were placed back into the wound  along with a new IPG.  The wound was closed in layers using 2-0 Vicryl  in a running 3-0 subcuticular Vicryl suture followed by Dermabond on the  skin.  We then tested the IPG  and there was good impedance and all  contacts were active in both leads.  Sterile dressing was applied.  The  patient was turned supine, extubated without difficulty, transferred to  recovery in good condition.      Sharolyn Douglas, M.D.  Electronically Signed    MC/MEDQ  D:  07/10/2006  T:  07/11/2006  Job:  [id number]

## 2010-12-29 NOTE — Op Note (Signed)
[place]. [facility]  Patient:    Valerie Baker, Valerie Baker Visit Number: [id number] MRN: [id number]          Service Type: DSU Location: [facility] Attending Physician:  Alinda Deem Dictated by:   Alinda Deem, M.D. Proc. 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  OPERATION PERFORMED:  Right elbow Nirschl lateral epicondyle release and radial tunnel release.  SURGEON:  Alinda Deem, M.D.  ASSISTANT:  Dorthula Matas, P.A.-C.  ANESTHESIA:  General LMA.  ESTIMATED BLOOD LOSS:  Minimal.  FLUID REPLACEMENT:  800 cc of crystalloid.  DRAINS:  None.  TOURNIQUET TIME:  Approximately 30 minutes.  INDICATIONS FOR PROCEDURE:  The patient is a 55 year old woman who has been followed for right elbow lateral epicondylitis for many months treated conservatively with anti-inflammatory medicines, physical therapy and arm band, cortisone injections and most recently had EMGs and NCVs done that were suspicious for radial tunnel syndrome.  She has failed all conservative measures and was taken for a right elbow lateral epicondyle Nirschl release and radial tunnel release as the arcade of Frohse.  DESCRIPTION OF PROCEDURE:  The patient was identified by arm band and taken to the operating room at [facility] where the appropriate anesthetic monitors were attached and general endotracheal anesthesia induced with the patient in the supine position.  A tourniquet was applied high to the right arm.  The right upper extremity was prepped and draped in the usual sterile fashion from the fingertips to the tourniquet.  The limb was wrapped with an Esmarch bandage, tourniquet inflated to 250 mHg and a 7 cm incision centered over  the lateral epicondyle was made starting 2 cm above the lateral epicondyle and extending distally for about 5 cm.  Small bleeders in the skin and subcutaneous tissues were identified and cauterized with a bipolar.  The origin of the brachioradialis, ECRB and ECRL, was elevated off of the lateral epicondyle and then split distally down to the level of the supinator and then elevated off of the supinator so that we could palpate and  identify the arcade of Frohse where the radial nerve was noted to pierce into the supinator.  Using tenotomy scissors, we then cut through the fibrous band of the arcade of Frohse accomplishing the radial tunnel release.  Using small rongeurs, we then freshened up the bone of the lateral epicondyle removing 1 or 2 mm and also removing the blob of scar tissue at the origin of the mobile wad of 3.  Once this had been accomplished, the wound was washed out with normal saline solution, the tourniquet let down, small bleeders identified and cauterized.  The longitudinal incision and the ECRB and ECRL was then closed with running 3-0 Vicryl suture sewing the tendon back down to the lateral epicondyle.  Subcutaneous tissues were also closed with 3-0 Vicryl suture and the skin with running interlocking 3-0 nylon suture.  A dressing of Xeroform, 4 x 4 dressing sponges, Webril, posterior splint and an Ace wrap applied.  The patient was  awakened and taken to the recovery room without difficulty.  The patient was then awakened and taken to the recovery room without difficulty.  Dictated by:   Alinda Deem, M.D. Attending Physician:  Alinda Deem DD:  11/26/01 TD:  11/26/01 Job: [id number] [id number]

## 2010-12-29 NOTE — Op Note (Signed)
[facility]  Patient:    Valerie Baker, Valerie Baker Visit Number: [id number] MRN: [id number]          Service Type: SUR Location: 4W [id number] Attending Physician:  Patricia Nettle Dictated by:   Patricia Nettle, M.D. 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  PROCEDURES: 1. L3-4 posterior lateral arthrodesis. 2. L3-4 pedicle screw instrumentation. 3. Left posterior iliac crest bone graft. 4. Allograft. 5. Neural monitoring utilizing both triggered as well as free running    EMGs.  SURGEON:  Patricia Nettle, M.D.  ASSISTANT:  Colleen P. Mahar, P.A.  ANESTHESIA:  General.  COMPLICATIONS:  None.  INDICATIONS:  The patient is a 55 year old female who is status post stand-alone PLIF procedure utilizing Ray cages. This was done by Dr. Channing Mutters. Unfortunately, she developed a pseudarthrosis which was confirmed with a Cage-O-Gram. Because of her chronic unrelenting pain, Dr. Channing Mutters had recommended pedicle screw instrumentation and posterolateral fusion. I encouraged her to return to him for this procedure. Unfortunately, there was a personality conflict and she wished to transfer her care to me. All the risks, benefits, and alternatives were discussed with the patient and she elected to proceed. There was an MRI scan performed before surgery which showed moderate spinal stenosis at the L4-5 adjacent level. It was felt that the majority of her symptoms were related to the pseudarthrosis and it was decided not to address this at the procedure. The patient understands she may need additional surgery in the future.  PROCEDURE:  The patient was properly identified in the holding area and taken to the operating room. She underwent general endotracheal anesthesia without difficulty. She  was given prophylactic IV antibiotics. EMG leads were placed so that continuous and free running EMGs could be recorded throughout the procedure. She was turned prone onto the [employer] positioning frame. All bony prominences were padded. Her face and eyes were protected. The back was prepped and draped in the usual sterile fashion. The previous midline incision over L3-4 was utilized. We injected the subcutaneous tissue with 1% lidocaine and epinephrine. A 4-cm incision was made directly over the left posterior superior iliac spine. this was carried down to the deep fascia. The deep fascia was incised and the iliac crest was exposed. We then used ______ to remove the cap and followed by curettes to remove copious amounts of cancellous bone from between the iliac tables. The wound was irrigated. Gelfoam was placed into the crest. We then closed the deep fascia using running 1 Vicryl followed by 2-0 Vicryl on subcutaneous tissue and then a running 3-0 nylon subcuticularly. At this point, attention was directed to the midline. Again, utilizing the previous midline incision, dissection was carried down to the deep fascia. We then elevated the subcutaneous layer over the fascia bilaterally. Starting on the left side, the fascia was incised 2 cm off the midline. We identified the plane between the multifidus and longissimus. We then developed this plane down to the transverse processes of L3 and L4. The transverse processes were exposed and the intertransverse membrane was visualized. At this point, the transverse processes were decorticated and copious amounts of autogenous bone graft was placed in the intertransverse interval.  We then placed pedicle screws using an anatomic probing technique as well as fluoroscopy. All the pedicle holes were palpated with the ball-tip feeler confirming five intact bony walls. We then tapped the holes and placed 40 x 6.25 screws at L3 and L4 on the  left. We then placed our rods, applied gentle compression, and locked the rod into place with a Typhoon caps. We then placed AlloMatrix allograft putty into the intertransverse interval over the previously placed autogenous bone graft. We closed the fascia and then performed a similar procedure on the right side. A 6.25 x 40 mm screw was placed at L4 and a 5.5 c 40 mm screw was placed at L3 on the right. It should be noted that after the screws were placed on both the right and left sides, they were stimulated with the EMG probe and there was no recording until greater than 20 mA indicating osseous placement of the pedicle screws. Free running EMGs were also monitored throughout placement of the screws and there were no deleterious changes. The midline wound was then closed using 2-0 interrupted Vicryl followed by a running 3-0 locking nylon. Sterile dressing was applied. The patient was turned supine, extubated without difficulty, transferred to the recovery room in stable condition. Dictated by:   Patricia Nettle, M.D. Attending Physician:  Patricia Nettle. DD:  02/10/02 TD:  02/13/02 Job: [id number]

## 2010-12-29 NOTE — Discharge Summary (Signed)
NAMEMarland Kitchen  Valerie Baker, Valerie Baker                     ACCOUNT NO.:  [id number]   MEDICAL RECORD NO.:  [id number]                   PATIENT TYPE:  INP   LOCATION:  0446                                 FACILITY:  [facility]   PHYSICIAN:  Patricia Nettle, M.D.                  DATE OF BIRTH:  Sep 12, 1955   DATE OF ADMISSION:  02/10/2002  DATE OF DISCHARGE:  02/14/2002                                 DISCHARGE SUMMARY   ADMISSION DIAGNOSES:  1. Pseudoarthrosis at L3-4.  2. Hypertension.  3. Gastroesophageal reflux disease.  4. History of incontinence.  5. History of anxiety.   DISCHARGE DIAGNOSES:  1. Status post L3-4 posterior spinal fusion with pedicle screws.  2. 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  3. Hypertension.  4. Gastroesophageal reflux disease.  5. History of incontinence.  6. History of anxiety.   CONSULTATIONS:  None.   PROCEDURE:  L3-4 posterolateral arthrodesis, pedicle screw instrumentation,  posterior iliac crest bone grafting.  This was done by Patricia Nettle, M.D.  with the assistance of [facility]  Anesthesia used was general.   BRIEF HISTORY:  The patient is a 55 year old female with a longstanding  history of back pain.  She underwent an L3-4 posterior interbody fusion  utilizing stand-alone Ray cages September of 2002.  Postoperatively, she  never had any improvement in symptoms and actually developed worsening pain  in her back and anterior her thighs.  She described the pain as a 9 out of  10 on a visual on the wall scale.  She describes her symptoms as burning and  aching across her low back and with intermittent stabbing pain with changes  in position.  She also complains of numbness and aching into her thighs  bilaterally.  She goes on to state that any activities including bending,  exercise, sneezing, standing, walking, bending forward, bending backward  worsened her symptoms.  She states she is completely disabled from her pain  symptoms and that surgical intervention, risks and benefits of surgery had  been discussed and she wished to proceed.   LABORATORY DATA:  Preoperatively CBC with diff was within normal limits with  only slightly elevated eosinophils of 7.  PT, INR and PTT were within normal  limits.  Perioperative complete metabolic panel was within normal limits.  UA was also within normal limits.  Blood type was A, Rh type positive,  antibody screen negative.  Postoperatively, her hemoglobin and hematocrit  were monitored reaching a level of 9.6 and 27.6 respectively on February 13, 2002.  However, she was stable, asymptomatic, and did not require any blood  transfusions.  Basic metabolic panel from February 11, 2002 was within normal  limits with an exception of a slightly elevated glucose of 133.   DIAGNOSTIC DATA:  EKG from April 11, 2001 was on the chart, being normal  sinus rhythm, normal tracing, read by Maisie Fus A. Patty Sermons, M.D.  No more  current EKG is noted on the chart.  X-rays from February 10, 2002  intraoperatively showed there has been a posterior lumbar fusion at L3-4.  Alignment of L3-4 level proved satisfactory of total occlusion visualized on  the C-arm views appear in satisfactory position.  There is also two Ray  cages seen at L3-4 which appear in satisfactory position.  On July 4th,  lumbar spine films show a satisfactory appearance following posterior lumbar  fusion at L3-4.  The pedicle screws appear in satisfactory position and  appear intact.  The posterior bar and Ray cage appear in satisfactory  position, alignment satisfactory.   HOSPITAL COURSE:  The patient was admitted on February 10, 2002.  She was taken  to the operating room for the above listed procedure.  She tolerated the  procedure very well without any intraoperative complications.  She was  transferred to the recovery room in stable condition.  Estimated blood was  approximately 250 cc.  There were no drains placed  intraoperatively.  Throughout her admission the patient was placed on appropriate antibiotic  course.  This was continued for 48 hours.  She completed this course without  any difficulty.  Postoperatively, her pain control was obtained using a  combination of a PCA as well as p.o. analgesics.  She was transitioned over  to strictly p.o. analgesics as of postop day #1 to day #2.  Pain control  remained adequate and appropriate throughout her hospital stay.  Incentive  spirometer was utilized in attempt to prevent any postoperative pulmonary  complications.  This was utilized q.1 h. while awake.   DVT prophylaxis, we used TED hose bilateral lower extremities as well as  STDs bilateral lower extremities and __________ calf pumps.   Neurovascular checks were started upon admission and continued throughout  her entire hospital stay.  She remained neurovascularly intact without any  compromise or complications.  Physical therapy and occupational therapy were  consulted to assist the patient with progressive ambulation as well as brace  application and back precautions.  She did very well with them without any  problems.   The patient was held NPO until she passed flatus and we then began to slowly  advance her diet to clear liquids and then on up to a regular diet.  She did  well with this through her postoperative course.  She did have a bowel  movement prior to discharge on February 14, 2002.   The patient's operative dressing was taken down on postop day #2 to reveal a  good looking incision without any signs or symptoms of infections.  Sutures  were intact.  Daily dressing changes were done thereafter and continued to  look very good without any signs or symptoms of infection.   Discharge planners were consulted to assist [place] with any home needs.   By February 14, 2002, the patient was doing very well.  She was medically stable. She was orthopedically stable and met all goals and was ready for  discharge  to her home.  Her vital signs were stable.  She was afebrile.  She was  neurovascularly intact.  Motor and sensory exams were intact and unchanged  throughout her hospital course.  Dressing was dry and incision looked good  without any signs or symptoms of infections.   DISCHARGE PLAN:  The patient is a 55 year old female status post posterior  spinal fusion at L3-4, doing very well.   ACTIVITY AND BACK PRECAUTIONS:  The patient did  indicate understanding of  these.  She is weightbearing as tolerated.  She is to get up and ambulate as  ad lib with brace.  Dressing changes done on daily basis.  She may shower on  postoperative day #5 assuming there is no drainage from the incision.   FOLLOW UP:  Two weeks postoperatively with Patricia Nettle, M.D.  She was  instructed to call for an appointment.   MEDICATIONS ON DISCHARGE:  Colace 100 mg p.o. b.i.d., Percocet p.r.n. pain,  Robaxin p.r.n. muscle spasm, and we also discussed over-the-counter  laxatives for p.r.n. use.  Indicated understanding, will notify us about any  problems.   DIET:  Diet is preoperative as tolerated.   CONDITION ON DISCHARGE:  Stable and improved.   DISPOSITION:  The patient is being discharged to her home with her family's  assistance.     Verlin Fester, P.A.                       Patricia Nettle, M.D.    CM/MEDQ  D:  03/25/2002  T:  03/27/2002  Job:  [id number]

## 2010-12-29 NOTE — H&P (Signed)
NAMEMarland Kitchen  Valerie Baker, Valerie Baker NO.:  [id number]   MEDICAL RECORD NO.:  [id number]          PATIENT TYPE:  INP   LOCATION:  5012                         FACILITY:  [facility]   PHYSICIAN:  Sharolyn Douglas, M.D.        DATE OF BIRTH:  Feb 05, 1956   DATE OF ADMISSION:  01/10/2006  DATE OF DISCHARGE:                                HISTORY & PHYSICAL   CHIEF COMPLAINT:  Low back and bilateral lower extremity pain.   HISTORY:  A 55 year old female who has been having problems with her back  for one month now.  Her pain has been increasing. She was found to have  severe spinal stenosis.  Because of the severity of the stenosis, it was  felt she needed a revision laminectomy and fusion below her previous surgery  at L4-5.  Risks and benefits of the procedure were discussed with the  patient by Dr. Noel Gerold.  She indicated understanding and opted to proceed.   ALLERGIES:  SULFA causes rash.   MEDICATIONS:  1.  Amlodipine 10 mg once daily.  2.  Sertraline.  3.  Hydrocodone.  4.  Arthrotec.  5.  Cyclobenzaprine.  6.  Trazodone.   PAST MEDICAL HISTORY:  1.  Hypertension.  2.  Asthma.  3.  History of migraines.  4.  History of anxiety and depression.   PAST SURGICAL HISTORY:  1.  Patient had a previous L3-4 posterior spinal fusion.  2.  She also had a spinal cord stimulator and IPG implant.   FAMILY HISTORY:  Noncontributory.   REVIEW OF SYSTEMS:  Denies fevers, chills, sweats, bleeding tendencies.  CNS:  Denies blurry vision, double vision, seizures, headache, paralysis.  CARDIOVASCULAR:  Denies chest pain, angina, orthopnea, claudication or  palpitations.  PULMONARY:  Denies shortness of breath, productive cough or  hemoptysis.  GI:  Denies nausea, vomiting, constipation, diarrhea, melena,  or bloody stool.  GU:  Denies dysuria, hematuria or discharge.  MUSCULOSKELETAL:  As per HPI.   PHYSICAL EXAMINATION:  GENERAL APPEARANCE:  Patient is a 55 year old white  female who is alert  and oriented, no acute distress.  Well-nourished, well-  groomed, appeared stated age, pleasant and cooperative to exam.  VITAL SIGNS:  Pulses 84 and regular, respirations 16 and unlabored, blood  pressure 130/80.  HEENT:  Head is normocephalic and atraumatic.  Pupils are equal, round and  reactive to light.  Extraocular movements intact.  Nose patent, pharynx is  clear.  NECK:  Supple to palpation, no lymphadenopathy or thyromegaly noted.  No  bruits appreciated.  CHEST:  Clear to auscultation bilaterally.  No wheezing, rhonchi, stridor or  rales.  BREASTS:  Not pertinent and not performed.  CARDIOVASCULAR:  S1 and S2, regular rate and rhythm, no murmurs, rubs, or  gallops noted.  ABDOMEN:  Positive bowel sounds, nontender, nondistended, no organomegaly  noted.  GU:  Not pertinent, not performed.  EXTREMITIES:  As per HPI.  SKIN:  Intact without any lesions or rashes.   X-ray and MRI shows severe spinal stenosis L4-5.   IMPRESSION:  1.  Spinal stenosis L4-5 below a previous incision  at L3-4.  2.  Hypertension.  3.  Asthma.  4.  History of migraines.  5.  History of anxiety and depression.   PLAN:  Admit to [facility] on Jan 10, 2006, for  adjacent segment laminectomy and fusion at L4-5.  The surgery will be done  by Dr. Sharolyn Douglas.   Patient's primary care physician is Dr. Molly Maduro Day in [place], [place], who has cleared her for her planned surgery.      Verlin Fester, P.A.      Sharolyn Douglas, M.D.  Electronically Signed    CM/MEDQ  D:  01/10/2006  T:  01/10/2006  Job:  [id number]

## 2010-12-29 NOTE — Op Note (Signed)
NAMEMarland Kitchen  Valerie Baker, Valerie Baker                     ACCOUNT NO.:  [id number]   MEDICAL RECORD NO.:  [id number]                   PATIENT TYPE:  OBV   LOCATION:  0378                                 FACILITY[facility]   PHYSICIAN:  Angelia Mould. Derrell Lolling, M.D.             DATE OF BIRTH:  01-05-1956   DATE OF PROCEDURE:  11/05/2002  DATE OF DISCHARGE:                                 OPERATIVE REPORT   PREOPERATIVE DIAGNOSIS:  Gastroesophageal reflux disease.   POSTOPERATIVE DIAGNOSIS:  Gastroesophageal reflux disease.   OPERATION PERFORMED:  Laparoscopic Nissen fundoplication.   SURGEON:  Angelia Mould. Derrell Lolling, M.D.   FIRST ASSISTANT:  Abigail Miyamoto, M.D.   OPERATIVE INDICATIONS:  This is a 55 year old white female, who has had  problems with gastroesophageal reflux for many years.  For the past 3-4  years, she has been having progressive problems with nocturnal  regurgitation, hoarseness, coughing, a sore throat, and frank fluid  regurgitation into her mouth.  She has also had about an 80 pound weight  gain in the last three years following her becoming disabled because of  orthopedic and back problems.  Upper endoscopy shows a small hiatal hernia  and what appeared to be a patulous GE junction but no esophagitis.  Her  symptoms have not been controlled with medical therapy, and she was  interested in surgery.  She was evaluated with manometry which normal  velocity, amplitude, and duration of peristalsis and a lower esophageal  sphincter pressure which was normal to slightly low but only 71% relaxation.  Upper GI showed a tiny sliding hiatal hernia and free reflux but no  stricture.  Gallbladder ultrasound shows no gallstones.  She is brought to  the operating room electively.   OPERATIVE TECHNIQUE:  Following the induction of general endotracheal  anesthesia, the patient's abdomen was prepped and draped in a sterile  fashion.  Marcaine 0.5% with epinephrine was used as a local  infiltration  anesthetic.  A 10 mm Hasson trocar was placed via an open technique about 3  cm above the umbilicus in the midline.  This was secured with a pursestring  suture of 0 Vicryl.  Pneumoperitoneum was created.  Video camera was  inserted.  We found that she had some very large falciform ligament, and her  liver was large and pale and was very soft and friable and bled easily which  created some problems during the case.  We saw no other pathology.   Two 10 mm trocars were placed in the right upper quadrant.  A 10 mm trocar  was placed in the left upper quadrant laterally for retraction.  A 5 mm  trocar was placed in the subxiphoid area and through that trocar, we placed  a 5 mm deformable retractor, elevating the left lobe of the liver.  The left  lobe of the liver was quite large, and the capsule tore a few times during  the case, requiring  electrocautery.  This was fairly easily controlled.  Ultimately, we had to place another 10 mm trocar in the left upper quadrant  to facilitate dissection.  We placed the fundus of the stomach on traction.  Using the harmonic scalpel, I divided the gastrohepatic omentum, exposing  the right crus and the diaphragm.  I dissected the areolar tissue away from  the anterior surface of the esophagus.  I dissected the right crus away from  the retroesophageal area and began to create a nice window in that area.  The posterior vagus nerve was seen and preserved close to the posterior wall  of the esophagus.  We took down short gastric vessels using the harmonic  scalpel.  We had one short gastric blood vessel that bled a fair amount,  perhaps 50 mL, right at the upper pole of the spleen.  We ultimately exposed  this and controlled this with the harmonic scalpel.  We observed this for  over an hour, put some Surgicel on it, and it had no more bleeding.  We  completely mobilized the fundus of the stomach away from the left crus and  freed up both the  right and left crura quite well.  The crura were really  not that diastatic, and she really did not seem to have much of a sliding  hiatal hernia at all.  We were able to expose 6-7 cm of intra-abdominal  esophagus without much difficulty and create a large window behind the  esophagus through which the fundus would pass easily.  The esophageal crura  were closed posteriorly with two interrupted sutures of 0 Surgidac.  After  this was done, we passed a 50 French lighted dilator down through the  esophagus into the stomach.  Jenelle Mages. Fortune, M.D. did that, and there  was no complication to that.  We then performed a Nissen fundoplication over  the 50 Bougie, but we made sure to make it very loose.  This was not much  problem because we had mobilized the fundus quite well.  The fundoplication  was performed with three interrupted sutures of 0 Surgidac.  Each bite took  a bite of the fundus on the left side, the anterior wall of the esophagus,  and a bite of the fundus on the right side.  Each of these sutures were  placed and tied and marked with a metal clip.  This seemed to provide a very  nice fundoplication.  The dilator was removed.   We then irrigated the operative field, checked around the spleen.  There was  no bleeding around the spleen or around the esophageal hiatus, but we did  have a little bit of bleeding from the capsule of the liver.  Specifically,  there were two areas of the capsule of the liver that were slowly bleeding,  but this was completely controlled with electrocautery.  We noticed that  there was a laceration of the undersurface of the left lobe of the liver as  we were removing the retractor.  There was no bleeding and no bile leak  whatsoever.  We irrigated this and checked it very carefully.  I was  somewhat concerned that she might develop a bile leak, and so I placed a 10  J[place] Blake drain under the left lobe of the liver and brought it out in the right  upper quadrant.  This was sutured to the skin with a nylon suture.   After I was satisfied that there was no  bleeding whatsoever, we removed all  of the trocars under direct vision.  There was no bleeding from any of the  trocar sites.  The pneumoperitoneum was released.  The fascia at the  umbilicus was closed with 0 Vicryl suture.  Skin incisions were closed with  Steri-Strips and skin staples.  Clean bandages were placed and the patient  taken to recovery room in stable condition.  Estimated blood loss was about  75-100 mL.  Complications were none.  Sponge, needle, and instrument counts  were correct.                                               Angelia Mould. Derrell Lolling, M.D.    HMI/MEDQ  D:  11/05/2002  T:  11/05/2002  Job:  [id number]   cc:   Everardo All. Madilyn Fireman, M.D.  [address], [address]  [place]  [place] [address]  Fax: [phone]   Gaynelle Cage, MD  [phone] [address]  [place]  [place] [address]  Fax: [phone]

## 2011-01-03 ENCOUNTER — Other Ambulatory Visit: Payer: Self-pay | Admitting: Orthopedic Surgery

## 2011-01-03 DIAGNOSIS — M25552 Pain in left hip: Secondary | ICD-10-CM

## 2011-01-12 ENCOUNTER — Ambulatory Visit
Admission: RE | Admit: 2011-01-12 | Discharge: 2011-01-12 | Disposition: A | Discharge status: Home / Self Care | Payer: Medicare Other | Source: Ambulatory Visit | Attending: Orthopedic Surgery | Admitting: Orthopedic Surgery

## 2011-01-12 DIAGNOSIS — M25552 Pain in left hip: Secondary | ICD-10-CM

## 2011-04-22 ENCOUNTER — Emergency Department (HOSPITAL_COMMUNITY)
Admission: EM | Admit: 2011-04-22 | Discharge: 2011-04-22 | Disposition: A | Discharge status: Home / Self Care | Payer: Medicare Other | Attending: Emergency Medicine | Admitting: Emergency Medicine

## 2011-04-22 DIAGNOSIS — R1011 Right upper quadrant pain: Secondary | ICD-10-CM

## 2011-04-22 DIAGNOSIS — R109 Unspecified abdominal pain: Secondary | ICD-10-CM | POA: Insufficient documentation

## 2011-04-22 DIAGNOSIS — R110 Nausea: Secondary | ICD-10-CM | POA: Insufficient documentation

## 2011-04-22 DIAGNOSIS — R197 Diarrhea, unspecified: Secondary | ICD-10-CM | POA: Insufficient documentation

## 2011-04-22 DIAGNOSIS — I10 Essential (primary) hypertension: Secondary | ICD-10-CM | POA: Insufficient documentation

## 2011-04-22 HISTORY — DX: Essential (primary) hypertension: I10

## 2011-04-22 LAB — CBC
HCT: 40.3 % (ref 36.0–46.0)
Hemoglobin: 13.5 g/dL (ref 12.0–15.0)
MCV: 89 fL (ref 78.0–100.0)
RDW: 13.1 % (ref 11.5–15.5)
WBC: 11 10*3/uL — ABNORMAL HIGH (ref 4.0–10.5)

## 2011-04-22 LAB — COMPREHENSIVE METABOLIC PANEL
AST: 17 U/L (ref 0–37)
BUN: 18 mg/dL (ref 6–23)
CO2: 26 mEq/L (ref 19–32)
Calcium: 10.4 mg/dL (ref 8.4–10.5)
Chloride: 100 mEq/L (ref 96–112)
Creatinine, Ser: 0.81 mg/dL (ref 0.50–1.10)
GFR calc Af Amer: >60 mL/min (ref >60)
GFR calc non Af Amer: >60 mL/min (ref >60)
Glucose, Bld: 98 mg/dL (ref 70–99)
Total Bilirubin: 0.3 mg/dL (ref 0.3–1.2)

## 2011-04-22 LAB — DIFFERENTIAL
Basophils Absolute: 0.1 10*3/uL (ref 0.0–0.1)
Eosinophils Relative: 1 % (ref 0–5)
Lymphocytes Relative: 19 % (ref 12–46)
Monocytes Absolute: 0.7 10*3/uL (ref 0.1–1.0)
Monocytes Relative: 6 % (ref 3–12)
Neutro Abs: 8 10*3/uL — ABNORMAL HIGH (ref 1.7–7.7)

## 2011-04-22 LAB — LIPASE, BLOOD: Lipase: 25 U/L (ref 11–59)

## 2011-04-22 MED ORDER — HYDROMORPHONE HCL 1 MG/ML IJ SOLN
1.0000 mg | Freq: Once | INTRAMUSCULAR | Status: AC
  Administered 2011-04-22: 1 mg via INTRAVENOUS
  Filled 2011-04-22: qty 1

## 2011-04-22 MED ORDER — SODIUM CHLORIDE 0.9 % IV SOLN
INTRAVENOUS | Status: DC
  Administered 2011-04-22: 17:00:00 via INTRAVENOUS

## 2011-04-22 MED ORDER — SODIUM CHLORIDE 0.9 % IV BOLUS (SEPSIS)
500.0000 mL | Freq: Once | INTRAVENOUS | Status: AC
  Administered 2011-04-22: 500 mL via INTRAVENOUS

## 2011-04-22 MED ORDER — ONDANSETRON HCL 4 MG/2ML IJ SOLN
4.0000 mg | Freq: Once | INTRAMUSCULAR | Status: AC
  Administered 2011-04-22: 4 mg via INTRAVENOUS
  Filled 2011-04-22: qty 2

## 2011-04-22 MED ORDER — HYDROCODONE-ACETAMINOPHEN 5-325 MG PO TABS: 1.0000 | ORAL_TABLET | ORAL | Status: AC | PRN

## 2011-04-22 MED ORDER — ONDANSETRON HCL 4 MG PO TABS: 4.0000 mg | ORAL_TABLET | Freq: Four times a day (QID) | ORAL | Status: AC

## 2011-04-22 NOTE — ED Provider Notes (Signed)
History  Scribed for Dr. Adriana Simas, the patient was seen in room 7. The chart was scribed by Gilman Schmidt. The patients care was started at 15:38.  CSN: [id number] Arrival date & time: 04/22/2011  2:58 PM  Chief Complaint  Patient presents with  . Abdominal Pain  . Nausea  . Emesis   HPI Valerie Baker is a 55 y.o. female who presents to the Emergency Department complaining of right upper quadrant abdominal pain. States that pain began two weeks ago. Pt was referred to the ED by PCP for ultrasound.  Pt reports feeling sore under her right breast. Pain is exacerbated after eating. She reports feeling nauseas after abdominal pain. There are no other associated symptoms and no other alleviating or aggravating factors.   HPI ELEMENTS:  Location: RUQ abdomen Onset: two weeks ago Duration: consistent since onset  Quality: soreness Modifying factors: exacerbated after eating  Context:  as above  Associated symptoms: nausea   PAST MEDICAL HISTORY:  Past Medical History  Diagnosis Date  . Hypertension    Acid Reflux  PAST SURGICAL HISTORY:  Past Surgical History  Procedure Date  . Back surgery   . Total knee arthroplasty   . Abdominal hysterectomy   . Appendectomy      MEDICATIONS:  Previous Medications   No medications on file     ALLERGIES:  Allergies as of 04/22/2011 - Review Complete 04/22/2011  Allergen Reaction Noted  . Sulfonamide derivatives  04/06/2009     FAMILY HISTORY:  History reviewed. No pertinent family history.   SOCIAL HISTORY: History  Substance Use Topics  . Smoking status: Never Smoker   . Smokeless tobacco: Not on file  . Alcohol Use: No  Accompanied to the ED by brother and sister.    Review of Systems  Gastrointestinal: Positive for nausea, vomiting, abdominal pain and diarrhea.  All other systems reviewed and are negative.    Physical Exam  BP 108/59  Pulse 61  Temp(Src) 98.1 F (36.7 C) (Oral)  Resp 20  Ht 5\' 5"  (1.651 m)  Wt 235  lb (106.595 kg)  BMI 39.11 kg/m2  SpO2 98%  Physical Exam  Nursing note and vitals reviewed. Constitutional: She is oriented to person, place, and time. She appears well-developed and well-nourished.       obese  HENT:  Head: Normocephalic and atraumatic.  Eyes: Conjunctivae and EOM are normal. Pupils are equal, round, and reactive to light.  Neck: Normal range of motion. Neck supple.  Cardiovascular: Normal rate and regular rhythm.   Pulmonary/Chest: Effort normal and breath sounds normal.  Abdominal: Soft. Bowel sounds are normal. There is tenderness (RUQ).  Musculoskeletal: Normal range of motion.  Neurological: She is alert and oriented to person, place, and time.  Skin: Skin is warm and dry.  Psychiatric: She has a normal mood and affect.   OTHER DATA REVIEWED: Nursing notes, vital signs, and past medical records reviewed.   DIAGNOSTIC STUDIES: Oxygen Saturation is 98% on room air, normal by my interpretation.     Labs:  Results for orders placed during the hospital encounter of 04/22/11  CBC      Component Value Range   WBC 11.0 (*) 4.0 - 10.5 (K/uL)   RBC 4.53  3.87 - 5.11 (MIL/uL)   Hemoglobin 13.5  12.0 - 15.0 (g/dL)   HCT 40.9  81.1 - 91.4 (%)   MCV 89.0  78.0 - 100.0 (fL)   MCH 29.8  26.0 - 34.0 (pg)   MCHC  33.5  30.0 - 36.0 (g/dL)   RDW 40.9  81.1 - 91.4 (%)   Platelets 248  150 - 400 (K/uL)  DIFFERENTIAL      Component Value Range   Neutrophils Relative 73  43 - 77 (%)   Neutro Abs 8.0 (*) 1.7 - 7.7 (K/uL)   Lymphocytes Relative 19  12 - 46 (%)   Lymphs Abs 2.1  0.7 - 4.0 (K/uL)   Monocytes Relative 6  3 - 12 (%)   Monocytes Absolute 0.7  0.1 - 1.0 (K/uL)   Eosinophils Relative 1  0 - 5 (%)   Eosinophils Absolute 0.1  0.0 - 0.7 (K/uL)   Basophils Relative 1  0 - 1 (%)   Basophils Absolute 0.1  0.0 - 0.1 (K/uL)  COMPREHENSIVE METABOLIC PANEL      Component Value Range   Sodium 135  135 - 145 (mEq/L)   Potassium 3.6  3.5 - 5.1 (mEq/L)   Chloride 100   96 - 112 (mEq/L)   CO2 26  19 - 32 (mEq/L)   Glucose, Bld 98  70 - 99 (mg/dL)   BUN 18  6 - 23 (mg/dL)   Creatinine, Ser 7.82  0.50 - 1.10 (mg/dL)   Calcium 95.6  8.4 - 10.5 (mg/dL)   Total Protein 7.5  6.0 - 8.3 (g/dL)   Albumin 4.0  3.5 - 5.2 (g/dL)   AST 17  0 - 37 (U/L)   ALT 17  0 - 35 (U/L)   Alkaline Phosphatase 138 (*) 39 - 117 (U/L)   Total Bilirubin 0.3  0.3 - 1.2 (mg/dL)   GFR calc non Af Amer >60  >60 (mL/min)   GFR calc Af Amer >60  >60 (mL/min)  LIPASE, BLOOD      Component Value Range   Lipase 25  11 - 59 (U/L)     RADIOLOGY: US Abdomen scheduled for Monday morning   ED COURSE / COORDINATION OF CARE: 1538: Patient evaluated by ED physician, labs & US Abdomen ordered  MDM:  Recheck blood work. Pain treated with IV meds. Ultrasound outpatient referral. White blood cell count normal. Mild elevation of alkaline phosphatase no acute abdomen  IMPRESSION: Diagnoses that have been ruled out:  Diagnoses that are still under consideration:  Final diagnoses:    PLAN:  Home. The patient is to return the emergency department if there is any worsening of symptoms. I have reviewed the discharge instructions with the patient and family.  CONDITION ON DISCHARGE: Stable  MEDICATIONS GIVEN IN THE E.D. Medications - No data to display  DISCHARGE MEDICATIONS: New Prescriptions   No medications on file    SCRIBE ATTESTATION:I personally performed the services described in this documentation, which was scribed in my presence. The recorded information has been reviewed and considered. Donnetta Hutching, MD   Procedures        Donnetta Hutching, MD 04/22/11 [phone]

## 2011-04-22 NOTE — ED Notes (Signed)
No needs at this time. States her abdominal pain is better. Voiced understanding of testing in the morning.

## 2011-04-22 NOTE — ED Notes (Addendum)
Pt presents with epigastric pain, nausea, vomiting and diarrhea. Pt states everything she eats she vomits up. Pt has seen PMD and was supposed to be scheduled Gallbladder ultrasound but does not have appointment yet. No active vomiting in triage at this time.Marland Kitchen

## 2011-04-23 ENCOUNTER — Ambulatory Visit (HOSPITAL_COMMUNITY)
Admission: EM | Admit: 2011-04-23 | Discharge: 2011-04-23 | Disposition: A | Discharge status: Home / Self Care | Payer: Medicare Other | Source: Ambulatory Visit | Attending: Emergency Medicine | Admitting: Emergency Medicine

## 2011-04-23 ENCOUNTER — Other Ambulatory Visit: Payer: Self-pay | Admitting: Family Medicine

## 2011-04-23 DIAGNOSIS — R112 Nausea with vomiting, unspecified: Secondary | ICD-10-CM

## 2011-04-23 DIAGNOSIS — R160 Hepatomegaly, not elsewhere classified: Secondary | ICD-10-CM | POA: Insufficient documentation

## 2011-04-23 DIAGNOSIS — R109 Unspecified abdominal pain: Secondary | ICD-10-CM | POA: Insufficient documentation

## 2011-04-26 ENCOUNTER — Other Ambulatory Visit (HOSPITAL_COMMUNITY): Payer: Medicare Other

## 2011-05-02 ENCOUNTER — Encounter: Payer: Self-pay | Admitting: Gastroenterology

## 2011-05-08 LAB — CBC
HCT: 29.3 — ABNORMAL LOW
HCT: 38.4
Hemoglobin: 13.5
MCHC: 33.3
MCHC: 34
MCHC: 34.2
MCV: 86.6
MCV: 87.3
MCV: 87.4
Platelets: 172
RBC: 3.84 — ABNORMAL LOW
RBC: 4.4
RDW: 13.5
RDW: 13.6
WBC: 10

## 2011-05-08 LAB — BASIC METABOLIC PANEL
BUN: 3 — ABNORMAL LOW
BUN: 4 — ABNORMAL LOW
BUN: 9
CO2: 24
CO2: 32
Calcium: 8.5
Chloride: 104
Chloride: 99
Creatinine, Ser: 0.51
Creatinine, Ser: 0.76
GFR calc Af Amer: >60
GFR calc Af Amer: >60
GFR calc non Af Amer: >60
Glucose, Bld: 112 — ABNORMAL HIGH
Glucose, Bld: 116 — ABNORMAL HIGH
Potassium: 3.3 — ABNORMAL LOW
Potassium: 4.5
Sodium: 139

## 2011-05-08 LAB — PROTIME-INR
INR: 1.5
INR: 1.5
Prothrombin Time: 13.5
Prothrombin Time: 18.5 — ABNORMAL HIGH

## 2011-05-11 LAB — COMPREHENSIVE METABOLIC PANEL
AST: 26
Alkaline Phosphatase: 132 — ABNORMAL HIGH
BUN: 18
CO2: 27
Chloride: 104
Creatinine, Ser: 0.76
GFR calc Af Amer: >60
GFR calc non Af Amer: >60
Potassium: 4.4
Total Bilirubin: 0.6

## 2011-05-11 LAB — CBC
HCT: 36.5
MCV: 83.1
RBC: 4.39
WBC: 6.4

## 2011-05-28 LAB — I-STAT 8, (EC8 V) (CONVERTED LAB)
Acid-Base Excess: 1
Bicarbonate: 26 — ABNORMAL HIGH
Glucose, Bld: 86
Potassium: 3.4 — ABNORMAL LOW
TCO2: 27
pH, Ven: 7.414 — ABNORMAL HIGH

## 2011-05-29 ENCOUNTER — Encounter: Payer: Self-pay | Admitting: Gastroenterology

## 2011-05-29 ENCOUNTER — Ambulatory Visit (INDEPENDENT_AMBULATORY_CARE_PROVIDER_SITE_OTHER): Payer: Medicare Other | Admitting: Gastroenterology

## 2011-05-29 VITALS — BP 134/68 | HR 60 | Ht 65.0 in | Wt 239.6 lb

## 2011-05-29 DIAGNOSIS — K921 Melena: Secondary | ICD-10-CM

## 2011-05-29 DIAGNOSIS — K7689 Other specified diseases of liver: Secondary | ICD-10-CM

## 2011-05-29 DIAGNOSIS — Z86010 Personal history of colonic polyps: Secondary | ICD-10-CM

## 2011-05-29 DIAGNOSIS — K760 Fatty (change of) liver, not elsewhere classified: Secondary | ICD-10-CM

## 2011-05-29 MED ORDER — DEXLANSOPRAZOLE 60 MG PO CPDR: 60.0000 mg | DELAYED_RELEASE_CAPSULE | Freq: Every day | ORAL | Status: DC

## 2011-05-29 MED ORDER — PEG-KCL-NACL-NASULF-NA ASC-C 100 G PO SOLR: 1.0000 | Freq: Once | ORAL | Status: DC

## 2011-05-29 NOTE — Patient Instructions (Addendum)
Hold your omeprazole and take Dexilant samples one tablet by mouth once daily.  You have been scheduled for a Upper Endoscopy/ Colonoscopy with propofol sedation. See separate instructions.  Pick up your prep kit from your pharmacy. cc: Belva Agee, MD

## 2011-05-29 NOTE — Progress Notes (Signed)
History of Present Illness: This is a 55 year old who I have evaluated in the past with has a history of adenomatous colon polyps. She is due for followup colonoscopy in November. In addition she has had a five to six-month history of a frequent cough associated with throat clearing, choking, intermittent nausea, liquid and solid food dysphasia. She was evaluated in the Emergency Department for right upper quadrant abdominal pain and nausea. LFTs from Dr. Meryle Ready office on 04/20/2011 were normal. Abdominal ultrasound imaging in September showed: No gallstones.  Enlarged liver spanning over 20.1 cm. Increased echogenicity and  heterogeneity may represent fatty infiltration without obvious  mass. Cirrhosis not excluded.  Portions of the pancreas, spleen and aorta not well delineated  secondary to overlying bowel gas. The portions which are  visualized appear unremarkable.  Denies weight loss, abdominal pain, constipation, diarrhea, change in stool caliber, melena, hematochezia, nausea, vomiting, dysphagia, reflux symptoms, chest pain.  Review of Systems: Pertinent positive and negative review of systems were noted in the above HPI section. All other review of systems were otherwise negative.  Current Medications, Allergies, Past Medical History, Past Surgical History, Family History and Social History were reviewed in [employer] record.  Physical Exam: General: Well developed , well nourished, obese, no acute distress Head: Normocephalic and atraumatic Eyes:  sclerae anicteric, EOMI Ears: Normal auditory acuity Mouth: No deformity or lesions Neck: Supple, no masses or thyromegaly Lungs: Clear throughout to auscultation Heart: Regular rate and rhythm; no murmurs, rubs or bruits Abdomen: Soft, non tender and non distended. No masses, hepatosplenomegaly or hernias noted. Normal Bowel sounds Rectal: Deferred to colonoscopy Musculoskeletal: Symmetrical with no gross  deformities  Skin: No lesions on visible extremities Pulses:  Normal pulses noted Extremities: No clubbing, cyanosis, edema or deformities noted Neurological: Alert oriented x 4, grossly nonfocal Cervical Nodes:  No significant cervical adenopathy Inguinal Nodes: No significant inguinal adenopathy Psychological:  Alert and cooperative. Normal mood and affect  Assessment and Recommendations:  1. Dysphagia to solids and liquids associated with a frequent cough, choking and throat clearing. She may have GERD with LPR and/or orpharyngeal dysphagia. There may be underlying ENT or pulmonary disorder as well. Discontinue omeprazole and begin Dexilant 60 mg daily. Standard antireflux measures. Further evaluation with upper endoscopy. The risks, benefits, and alternatives to endoscopy with possible biopsy and possible dilation were discussed with the patient and they consent to proceed. Propofol sedation given medications.  2. Personal history of adenomatous colon polyps originally diagnosed in November 2007. She is due for surveillance colonoscopy. Schedule with propofol as above. The risks, benefits, and alternatives to colonoscopy with possible biopsy and possible polypectomy were discussed with the patient and they consent to proceed.   3. Enlarged liver with fatty infiltration. Long-term program for management of fatty liver with a long-term, low-fat, weight loss diet and careful monitoring of lipids and blood sugars by her primary physician. Plan to repeat liver function tests and consider further hepatic evaluation after her swallowing and choking problems have been further evaluated

## 2011-05-31 ENCOUNTER — Encounter: Payer: Self-pay | Admitting: Gastroenterology

## 2011-06-12 ENCOUNTER — Encounter: Payer: Self-pay | Admitting: Gastroenterology

## 2011-06-12 ENCOUNTER — Ambulatory Visit (AMBULATORY_SURGERY_CENTER): Payer: Medicare Other | Admitting: Gastroenterology

## 2011-06-12 DIAGNOSIS — D126 Benign neoplasm of colon, unspecified: Secondary | ICD-10-CM

## 2011-06-12 DIAGNOSIS — K449 Diaphragmatic hernia without obstruction or gangrene: Secondary | ICD-10-CM

## 2011-06-12 DIAGNOSIS — K7689 Other specified diseases of liver: Secondary | ICD-10-CM

## 2011-06-12 DIAGNOSIS — R1319 Other dysphagia: Secondary | ICD-10-CM

## 2011-06-12 DIAGNOSIS — K219 Gastro-esophageal reflux disease without esophagitis: Secondary | ICD-10-CM

## 2011-06-12 DIAGNOSIS — Z87898 Personal history of other specified conditions: Secondary | ICD-10-CM

## 2011-06-12 DIAGNOSIS — K921 Melena: Secondary | ICD-10-CM

## 2011-06-12 DIAGNOSIS — Z1211 Encounter for screening for malignant neoplasm of colon: Secondary | ICD-10-CM

## 2011-06-12 DIAGNOSIS — Z86010 Personal history of colonic polyps: Secondary | ICD-10-CM

## 2011-06-12 HISTORY — PX: COLONOSCOPY: SHX174

## 2011-06-12 MED ORDER — SODIUM CHLORIDE 0.9 % IV SOLN: 500.0000 mL | INTRAVENOUS | Status: DC

## 2011-06-12 MED ORDER — DEXLANSOPRAZOLE 60 MG PO CPDR: 60.0000 mg | DELAYED_RELEASE_CAPSULE | Freq: Every day | ORAL | Status: DC

## 2011-06-12 NOTE — Patient Instructions (Addendum)
Your pathology will be mailed to you within two weeks.   Today you need to stay on a soft diet for the rest of the day due to the dilation of your esophagus.    Tomorrow you need to start a high fiber diet due to the diverticulosis in your colon.    Your upper scope has shown that you have a hiatal hernia .   Dr. Russella Dar wants you to continue you dexilant 60mg  1/2 hour before breakfast every morning.    The 3rd floor staff will schedule a speech pathology modified barium swallow for you.    You might need to go to either [facility] or [facility] to pick up a  Prep (drink).   Ask the person who schedules your appointment.   If you have any questions, call us at [phone].   Thank-you.

## 2011-06-13 ENCOUNTER — Telehealth: Payer: Self-pay

## 2011-06-13 NOTE — Telephone Encounter (Signed)

## 2011-06-13 NOTE — Telephone Encounter (Signed)
Patient is scheduled for [facility] 06/21/11 10:00.  Patient advised.

## 2011-06-14 ENCOUNTER — Other Ambulatory Visit (HOSPITAL_COMMUNITY): Payer: Self-pay | Admitting: Gastroenterology

## 2011-06-18 ENCOUNTER — Encounter: Payer: Self-pay | Admitting: Gastroenterology

## 2011-06-21 ENCOUNTER — Telehealth: Payer: Self-pay | Admitting: *Deleted

## 2011-06-21 ENCOUNTER — Ambulatory Visit (HOSPITAL_COMMUNITY)
Admission: RE | Admit: 2011-06-21 | Discharge: 2011-06-21 | Disposition: A | Discharge status: Home / Self Care | Payer: Medicare Other | Source: Ambulatory Visit | Attending: Gastroenterology | Admitting: Gastroenterology

## 2011-06-21 ENCOUNTER — Encounter (HOSPITAL_COMMUNITY): Payer: Self-pay | Admitting: Speech Pathology

## 2011-06-21 DIAGNOSIS — R1310 Dysphagia, unspecified: Secondary | ICD-10-CM | POA: Insufficient documentation

## 2011-06-21 NOTE — Telephone Encounter (Signed)
Called the pt and left a message that there were no orthopharyngel swallowing problems noted. I asked the pt to please call me back to advise she got my message.

## 2011-06-21 NOTE — Procedures (Signed)
OPMBS completed.  Pt presents with a normal oropharyngeal swallow; primary esophageal dysphagia present and consistent with pt's hx.  Study revealed retention of solid barium with accumulation to cervical esophagus per screen.  Liquids were found to facilitate clearance through  LES, however, even by study's completion there remained stasis in distal esophagus after two minutes.   Pt viewed MBS in real time and educated extensively re: importance of food selection (avoiding hard/dry foods), moistening esophagus prior to meals, alternating solids and liquids to facilitate clearance, and  sitting upright for one hour post meals.  No further f/u recommended.

## 2011-06-27 ENCOUNTER — Telehealth: Payer: Self-pay | Admitting: *Deleted

## 2011-06-27 NOTE — Telephone Encounter (Signed)
Message copied by Derry Skill on Wed Jun 27, 2011 10:13 AM ------      Message from: Jessee Avers      Created: Thu Jun 21, 2011  1:17 PM                   ----- Message -----         From: Eliezer Bottom., MD,FACG         Sent: 06/21/2011  12:50 PM           To: Christie Nottingham, CMA            No oropharyngeal swallowing problems noted

## 2011-06-27 NOTE — Telephone Encounter (Signed)
Called the patient on home phone.  No oropharyngeal swallowing problems noted.  I advised the patient to call and make an appointment if symptoms worsen.

## 2012-07-24 ENCOUNTER — Other Ambulatory Visit: Payer: Self-pay | Admitting: Orthopaedic Surgery

## 2012-07-24 DIAGNOSIS — M961 Postlaminectomy syndrome, not elsewhere classified: Secondary | ICD-10-CM

## 2012-07-28 ENCOUNTER — Ambulatory Visit
Admission: RE | Admit: 2012-07-28 | Discharge: 2012-07-28 | Disposition: A | Discharge status: Home / Self Care | Payer: Medicare Other | Source: Ambulatory Visit | Attending: Orthopaedic Surgery | Admitting: Orthopaedic Surgery

## 2012-07-28 DIAGNOSIS — M961 Postlaminectomy syndrome, not elsewhere classified: Secondary | ICD-10-CM

## 2012-08-13 HISTORY — PX: CERVICAL SPINE SURGERY: SHX589

## 2012-11-14 ENCOUNTER — Other Ambulatory Visit: Payer: Self-pay | Admitting: *Deleted

## 2012-11-14 MED ORDER — OXYBUTYNIN CHLORIDE 5 MG PO TABS: 5.0000 mg | ORAL_TABLET | Freq: Every day | ORAL | Status: DC

## 2012-11-14 MED ORDER — CITALOPRAM HYDROBROMIDE 20 MG PO TABS: 20.0000 mg | ORAL_TABLET | Freq: Every day | ORAL | Status: DC

## 2012-12-15 ENCOUNTER — Other Ambulatory Visit: Payer: Self-pay | Admitting: *Deleted

## 2012-12-15 MED ORDER — TRAZODONE HCL 100 MG PO TABS: 100.0000 mg | ORAL_TABLET | Freq: Every day | ORAL | Status: DC

## 2013-03-18 ENCOUNTER — Other Ambulatory Visit: Payer: Self-pay | Admitting: *Deleted

## 2013-03-18 DIAGNOSIS — K449 Diaphragmatic hernia without obstruction or gangrene: Secondary | ICD-10-CM

## 2013-03-18 MED ORDER — TRAZODONE HCL 100 MG PO TABS: 100.0000 mg | ORAL_TABLET | Freq: Every day | ORAL | Status: DC

## 2013-03-18 MED ORDER — DEXLANSOPRAZOLE 60 MG PO CPDR: 60.0000 mg | DELAYED_RELEASE_CAPSULE | Freq: Every day | ORAL | Status: DC

## 2013-03-18 NOTE — Telephone Encounter (Signed)
Patient last seen in office on 09-19-12 by ACM. Please advise

## 2013-03-26 ENCOUNTER — Other Ambulatory Visit: Payer: Self-pay | Admitting: Orthopaedic Surgery

## 2013-03-26 DIAGNOSIS — M546 Pain in thoracic spine: Secondary | ICD-10-CM

## 2013-04-01 ENCOUNTER — Ambulatory Visit
Admission: RE | Admit: 2013-04-01 | Discharge: 2013-04-01 | Disposition: A | Discharge status: Home / Self Care | Payer: Medicare Other | Source: Ambulatory Visit | Attending: Orthopaedic Surgery | Admitting: Orthopaedic Surgery

## 2013-04-01 VITALS — BP 157/77 | HR 48

## 2013-04-01 DIAGNOSIS — M546 Pain in thoracic spine: Secondary | ICD-10-CM

## 2013-04-01 MED ORDER — ONDANSETRON HCL 4 MG/2ML IJ SOLN
4.0000 mg | Freq: Once | INTRAMUSCULAR | Status: AC
  Administered 2013-04-01: 4 mg via INTRAMUSCULAR

## 2013-04-01 MED ORDER — MEPERIDINE HCL 100 MG/ML IJ SOLN
100.0000 mg | Freq: Once | INTRAMUSCULAR | Status: AC
  Administered 2013-04-01: 100 mg via INTRAMUSCULAR

## 2013-04-01 MED ORDER — DIAZEPAM 5 MG PO TABS
10.0000 mg | ORAL_TABLET | Freq: Once | ORAL | Status: AC
  Administered 2013-04-01: 10 mg via ORAL

## 2013-04-01 MED ORDER — IOHEXOL 300 MG/ML  SOLN: 10.0000 mL | Freq: Once | INTRAMUSCULAR | Status: AC | PRN

## 2013-04-01 NOTE — Progress Notes (Signed)
Pt states she has been off trazodone and citalopram for the past 2 days. Discharge instructions explained.

## 2013-04-10 ENCOUNTER — Telehealth: Payer: Self-pay | Admitting: Nurse Practitioner

## 2013-04-14 ENCOUNTER — Other Ambulatory Visit: Payer: Self-pay

## 2013-04-14 DIAGNOSIS — K449 Diaphragmatic hernia without obstruction or gangrene: Secondary | ICD-10-CM

## 2013-04-14 MED ORDER — OXYBUTYNIN CHLORIDE 5 MG PO TABS: 5.0000 mg | ORAL_TABLET | Freq: Every day | ORAL | Status: DC

## 2013-04-14 MED ORDER — DEXLANSOPRAZOLE 60 MG PO CPDR: 60.0000 mg | DELAYED_RELEASE_CAPSULE | Freq: Every day | ORAL | Status: DC

## 2013-04-14 MED ORDER — TRAZODONE HCL 100 MG PO TABS: 100.0000 mg | ORAL_TABLET | Freq: Every day | ORAL | Status: DC

## 2013-04-14 MED ORDER — FLUOCINONIDE-E 0.05 % EX CREA: TOPICAL_CREAM | Freq: Two times a day (BID) | CUTANEOUS | Status: DC

## 2013-04-14 NOTE — Telephone Encounter (Signed)
Rx ready for Phone in. Last refill. Due for office visit.

## 2013-04-14 NOTE — Telephone Encounter (Signed)
Last seen 09/19/12  ACM   If approved phone in Deseryl and route to nurse

## 2013-04-15 NOTE — Telephone Encounter (Signed)
All meds are done, but please address the lotion that needs to be gel for her mouth

## 2013-04-15 NOTE — Telephone Encounter (Signed)
done

## 2013-04-16 ENCOUNTER — Other Ambulatory Visit: Payer: Self-pay | Admitting: Orthopaedic Surgery

## 2013-04-16 NOTE — Telephone Encounter (Signed)
[facility]

## 2013-04-17 ENCOUNTER — Other Ambulatory Visit: Payer: Medicare Other

## 2013-04-17 ENCOUNTER — Ambulatory Visit
Admission: RE | Admit: 2013-04-17 | Discharge: 2013-04-17 | Disposition: A | Discharge status: Home / Self Care | Payer: Medicare Other | Source: Ambulatory Visit | Attending: Orthopaedic Surgery | Admitting: Orthopaedic Surgery

## 2013-04-20 MED ORDER — FLUOCINONIDE 0.05 % EX OINT: TOPICAL_OINTMENT | Freq: Two times a day (BID) | CUTANEOUS | Status: DC

## 2013-04-20 NOTE — Telephone Encounter (Signed)
Gel rx sent to pharmacy

## 2013-04-20 NOTE — Telephone Encounter (Signed)
Pt aware.

## 2013-04-22 NOTE — Telephone Encounter (Signed)
Please check to see that this was taken care of by MMM. MMM signed off and took care of it but it was stuck in my basket. Thanks. Adyson Vanburen P. Modesto Charon, M.D.

## 2013-06-16 ENCOUNTER — Other Ambulatory Visit: Payer: Self-pay

## 2013-06-16 MED ORDER — CITALOPRAM HYDROBROMIDE 20 MG PO TABS: 20.0000 mg | ORAL_TABLET | Freq: Every day | ORAL | Status: DC

## 2013-06-16 NOTE — Telephone Encounter (Signed)
Patient needs to be seen. Has exceeded time since last visit. Limited quantity refilled. Needs to bring all medications to next appointment.   

## 2013-06-16 NOTE — Telephone Encounter (Signed)
Last seen 09/19/12  ACM

## 2013-07-13 ENCOUNTER — Ambulatory Visit (INDEPENDENT_AMBULATORY_CARE_PROVIDER_SITE_OTHER): Payer: Medicare Other | Admitting: General Practice

## 2013-07-13 ENCOUNTER — Encounter (INDEPENDENT_AMBULATORY_CARE_PROVIDER_SITE_OTHER): Payer: Self-pay

## 2013-07-13 VITALS — BP 138/84 | HR 66 | Temp 97.3°F | Ht 64.0 in | Wt 240.0 lb

## 2013-07-13 DIAGNOSIS — R05 Cough: Secondary | ICD-10-CM

## 2013-07-13 DIAGNOSIS — R059 Cough, unspecified: Secondary | ICD-10-CM

## 2013-07-13 DIAGNOSIS — R062 Wheezing: Secondary | ICD-10-CM

## 2013-07-13 DIAGNOSIS — J209 Acute bronchitis, unspecified: Secondary | ICD-10-CM

## 2013-07-13 MED ORDER — METHYLPREDNISOLONE ACETATE 80 MG/ML IJ SUSP
80.0000 mg | Freq: Once | INTRAMUSCULAR | Status: AC
  Administered 2013-07-13: 80 mg via INTRAMUSCULAR

## 2013-07-13 MED ORDER — ALBUTEROL SULFATE HFA 108 (90 BASE) MCG/ACT IN AERS: 2.0000 | INHALATION_SPRAY | Freq: Four times a day (QID) | RESPIRATORY_TRACT | Status: DC | PRN

## 2013-07-13 MED ORDER — BENZONATATE 100 MG PO CAPS: 100.0000 mg | ORAL_CAPSULE | Freq: Two times a day (BID) | ORAL | Status: DC | PRN

## 2013-07-13 MED ORDER — PREDNISONE (PAK) 10 MG PO TABS
ORAL_TABLET | ORAL | Status: DC
Start: 2013-07-13 — End: 2013-08-18

## 2013-07-13 NOTE — Progress Notes (Signed)
   Subjective:    Patient ID: Valerie Baker, female    DOB: 03/14/1956, 57 y.o.   MRN: [id number]  Cough This is a new problem. The current episode started in the past 7 days. The problem has been gradually worsening. The problem occurs hourly. The cough is non-productive. Associated symptoms include a sore throat, shortness of breath and wheezing. Pertinent negatives include no chest pain, chills, ear congestion, fever, headaches, postnasal drip or rhinorrhea. The symptoms are aggravated by lying down. Treatments tried: cool liquids. Her past medical history is significant for asthma and bronchitis. There is no history of pneumonia.      Review of Systems  Constitutional: Negative for fever and chills.  HENT: Positive for sore throat. Negative for congestion, postnasal drip, rhinorrhea and sinus pressure.   Respiratory: Positive for cough, shortness of breath and wheezing. Negative for chest tightness.   Cardiovascular: Negative for chest pain and palpitations.  Genitourinary: Negative for difficulty urinating.  Neurological: Negative for dizziness, weakness and headaches.       Objective:   Physical Exam  Constitutional: She is oriented to person, place, and time. She appears well-developed and well-nourished.  Cardiovascular: Normal rate, regular rhythm and normal heart sounds.   Pulmonary/Chest: Effort normal. No respiratory distress. She exhibits no tenderness.  Patient coughed 2-3 times during visit, dry hacky cough  Neurological: She is alert and oriented to person, place, and time.  Skin: Skin is warm and dry.  Psychiatric: She has a normal mood and affect.          Assessment & Plan:  1. Acute bronchitis  - methylPREDNISolone acetate (DEPO-MEDROL) injection 80 mg; Inject 1 mL (80 mg total) into the muscle once. - predniSONE (STERAPRED UNI-PAK) 10 MG tablet; Start on 07/14/13  Dispense: 21 tablet; Refill: 0  2. Wheezing  - albuterol (PROVENTIL HFA;VENTOLIN HFA)  108 (90 BASE) MCG/ACT inhaler; Inhale 2 puffs into the lungs every 6 (six) hours as needed for wheezing or shortness of breath.  Dispense: 1 Inhaler; Refill: 1  3. Cough  - benzonatate (TESSALON) 100 MG capsule; Take 1 capsule (100 mg total) by mouth 2 (two) times daily as needed for cough.  Dispense: 20 capsule; Refill: 0 -RTO if symptoms worsen or unresolved -Patient verbalized understanding Coralie Keens, FNP-C

## 2013-07-13 NOTE — Patient Instructions (Signed)

## 2013-07-16 ENCOUNTER — Telehealth: Payer: Self-pay | Admitting: General Practice

## 2013-07-17 ENCOUNTER — Other Ambulatory Visit: Payer: Self-pay | Admitting: *Deleted

## 2013-07-17 ENCOUNTER — Telehealth: Payer: Self-pay | Admitting: General Practice

## 2013-07-17 NOTE — Telephone Encounter (Signed)
She needs appointment for routine visit. Also some of some meds were prescribed by provider outside this office.

## 2013-07-20 ENCOUNTER — Telehealth: Payer: Self-pay | Admitting: Family Medicine

## 2013-07-20 ENCOUNTER — Ambulatory Visit (INDEPENDENT_AMBULATORY_CARE_PROVIDER_SITE_OTHER): Payer: Medicare Other | Admitting: General Practice

## 2013-07-20 VITALS — BP 134/74 | HR 67 | Temp 98.4°F | Ht 64.0 in | Wt 239.0 lb

## 2013-07-20 DIAGNOSIS — J029 Acute pharyngitis, unspecified: Secondary | ICD-10-CM

## 2013-07-20 DIAGNOSIS — J0100 Acute maxillary sinusitis, unspecified: Secondary | ICD-10-CM

## 2013-07-20 MED ORDER — AZITHROMYCIN 250 MG PO TABS: ORAL_TABLET | ORAL | Status: DC

## 2013-07-20 NOTE — Telephone Encounter (Signed)
Patient was seen in office today.  

## 2013-07-20 NOTE — Patient Instructions (Signed)

## 2013-07-20 NOTE — Progress Notes (Signed)
   Subjective:    Patient ID: Valerie Baker, female    DOB: July 02, 1956, 56 y.o.   MRN: [id number]  Sore Throat  This is a new problem. The current episode started in the past 7 days. The problem has been gradually worsening. Neither side of throat is experiencing more pain than the other. There has been no fever. The pain is at a severity of 4/10. Associated symptoms include congestion and coughing. Pertinent negatives include no headaches, shortness of breath or vomiting. She has tried gargles for the symptoms.  Cough This is a new problem. The current episode started in the past 7 days. The problem has been unchanged. The cough is productive of sputum (greenish sputum). Associated symptoms include postnasal drip and a sore throat. Pertinent negatives include no chest pain, chills, fever, headaches or shortness of breath. The symptoms are aggravated by lying down. She has tried oral steroids for the symptoms. Her past medical history is significant for asthma and bronchitis. There is no history of pneumonia.      Review of Systems  Constitutional: Negative for fever and chills.  HENT: Positive for congestion, postnasal drip, sinus pressure and sore throat.   Respiratory: Positive for cough. Negative for chest tightness and shortness of breath.   Cardiovascular: Negative for chest pain and palpitations.  Gastrointestinal: Negative for vomiting.  Neurological: Negative for dizziness, weakness and headaches.       Objective:   Physical Exam  Constitutional: She is oriented to person, place, and time. She appears well-developed and well-nourished.  Cardiovascular: Normal rate, regular rhythm and normal heart sounds.   Pulmonary/Chest: Effort normal and breath sounds normal. No respiratory distress. She exhibits no tenderness.  Neurological: She is alert and oriented to person, place, and time.  Skin: Skin is warm and dry.  Psychiatric: She has a normal mood and affect.     Results  for orders placed in visit on 07/20/13  POCT RAPID STREP A (OFFICE)      Result Value Range   Rapid Strep A Screen Negative  Negative        Assessment & Plan:  1. Sore throat  - POCT rapid strep A  2. Sinusitis, acute maxillary  - azithromycin (ZITHROMAX) 250 MG tablet; Take as directed  Dispense: 6 tablet; Refill:  -adequate fluids -will do chest xray if cough persists for longer than two weeks -Increase fluid intake Motrin or tylenol OTC Throat lozenges if help New toothbrush in 3 days Proper handwashing -RTO if symptoms worsen or unresolved -Patient verbalized understanding Coralie Keens, FNP-C

## 2013-07-23 ENCOUNTER — Other Ambulatory Visit: Payer: Self-pay | Admitting: General Practice

## 2013-07-23 DIAGNOSIS — K449 Diaphragmatic hernia without obstruction or gangrene: Secondary | ICD-10-CM

## 2013-07-23 DIAGNOSIS — G4700 Insomnia, unspecified: Secondary | ICD-10-CM

## 2013-07-23 MED ORDER — OXYBUTYNIN CHLORIDE 5 MG PO TABS: 5.0000 mg | ORAL_TABLET | Freq: Every day | ORAL | Status: DC

## 2013-07-23 MED ORDER — TRAZODONE HCL 100 MG PO TABS: 100.0000 mg | ORAL_TABLET | Freq: Every day | ORAL | Status: DC

## 2013-07-23 MED ORDER — DEXLANSOPRAZOLE 60 MG PO CPDR: 60.0000 mg | DELAYED_RELEASE_CAPSULE | Freq: Every day | ORAL | Status: DC

## 2013-07-23 NOTE — Telephone Encounter (Signed)
Script sent to pharmacy.

## 2013-07-23 NOTE — Telephone Encounter (Signed)
Three scripts sent in to pharmacy,pt aware.

## 2013-08-17 ENCOUNTER — Ambulatory Visit: Payer: Medicare Other | Admitting: General Practice

## 2013-08-18 ENCOUNTER — Other Ambulatory Visit: Payer: Self-pay

## 2013-08-18 ENCOUNTER — Ambulatory Visit (INDEPENDENT_AMBULATORY_CARE_PROVIDER_SITE_OTHER): Payer: Medicare Other | Admitting: Family Medicine

## 2013-08-18 ENCOUNTER — Encounter: Payer: Self-pay | Admitting: Family Medicine

## 2013-08-18 VITALS — BP 148/71 | HR 60 | Temp 98.2°F | Ht 64.0 in | Wt 245.6 lb

## 2013-08-18 DIAGNOSIS — F3289 Other specified depressive episodes: Secondary | ICD-10-CM

## 2013-08-18 DIAGNOSIS — K449 Diaphragmatic hernia without obstruction or gangrene: Secondary | ICD-10-CM

## 2013-08-18 DIAGNOSIS — E785 Hyperlipidemia, unspecified: Secondary | ICD-10-CM

## 2013-08-18 DIAGNOSIS — R350 Frequency of micturition: Secondary | ICD-10-CM

## 2013-08-18 DIAGNOSIS — M25559 Pain in unspecified hip: Secondary | ICD-10-CM

## 2013-08-18 DIAGNOSIS — F32A Depression, unspecified: Secondary | ICD-10-CM

## 2013-08-18 DIAGNOSIS — R5381 Other malaise: Secondary | ICD-10-CM

## 2013-08-18 DIAGNOSIS — I10 Essential (primary) hypertension: Secondary | ICD-10-CM

## 2013-08-18 DIAGNOSIS — G4700 Insomnia, unspecified: Secondary | ICD-10-CM

## 2013-08-18 DIAGNOSIS — M8580 Other specified disorders of bone density and structure, unspecified site: Secondary | ICD-10-CM

## 2013-08-18 DIAGNOSIS — M25552 Pain in left hip: Secondary | ICD-10-CM

## 2013-08-18 DIAGNOSIS — M949 Disorder of cartilage, unspecified: Secondary | ICD-10-CM

## 2013-08-18 DIAGNOSIS — F329 Major depressive disorder, single episode, unspecified: Secondary | ICD-10-CM

## 2013-08-18 DIAGNOSIS — M899 Disorder of bone, unspecified: Secondary | ICD-10-CM

## 2013-08-18 DIAGNOSIS — R5383 Other fatigue: Secondary | ICD-10-CM

## 2013-08-18 LAB — POCT CBC
Granulocyte percent: 56.9 %G (ref 37–80)
HCT, POC: 36.6 % — AB (ref 37.7–47.9)
Hemoglobin: 11.5 g/dL — AB (ref 12.2–16.2)
Lymph, poc: 2.1 (ref 0.6–3.4)
MCH, POC: 26.2 pg — AB (ref 27–31.2)
MCHC: 31.4 g/dL — AB (ref 31.8–35.4)
MCV: 83.5 fL (ref 80–97)
MPV: 7.8 fL (ref 0–99.8)
POC Granulocyte: 3.2 (ref 2–6.9)
POC LYMPH PERCENT: 37 %L (ref 10–50)
Platelet Count, POC: 224 10*3/uL (ref 142–424)
RBC: 4.4 M/uL (ref 4.04–5.48)
RDW, POC: 15.5 %
WBC: 5.7 10*3/uL (ref 4.6–10.2)

## 2013-08-18 MED ORDER — DEXLANSOPRAZOLE 60 MG PO CPDR: 60.0000 mg | DELAYED_RELEASE_CAPSULE | Freq: Every day | ORAL | Status: DC

## 2013-08-18 MED ORDER — METHYLPREDNISOLONE (PAK) 4 MG PO TABS: ORAL_TABLET | ORAL | Status: DC

## 2013-08-18 MED ORDER — CITALOPRAM HYDROBROMIDE 20 MG PO TABS: 20.0000 mg | ORAL_TABLET | Freq: Every day | ORAL | Status: DC

## 2013-08-18 MED ORDER — OXYBUTYNIN CHLORIDE 5 MG PO TABS: 5.0000 mg | ORAL_TABLET | Freq: Every day | ORAL | Status: DC

## 2013-08-18 MED ORDER — TRAZODONE HCL 100 MG PO TABS: 100.0000 mg | ORAL_TABLET | Freq: Every day | ORAL | Status: DC

## 2013-08-18 MED ORDER — AMLODIPINE BESYLATE 10 MG PO TABS: 10.0000 mg | ORAL_TABLET | Freq: Every day | ORAL | Status: DC

## 2013-08-18 NOTE — Patient Instructions (Signed)
Hip Pain  The hips join the upper legs to the lower pelvis. The bones, cartilage, tendons, and muscles of the hip joint perform a lot of work each day holding your body weight and allowing you to move around.  Hip pain is a common symptom. It can range from a minor ache to severe pain on 1 or both hips. Pain may be felt on the inside of the hip joint near the groin, or the outside near the buttocks and upper thigh. There may be swelling or stiffness as well. It occurs more often when a person walks or performs activity. There are many reasons hip pain can develop.  CAUSES   It is important to work with your caregiver to identify the cause since many conditions can impact the bones, cartilage, muscles, and tendons of the hips. Causes for hip pain include:   Broken (fractured) bones.   Separation of the thighbone from the hip socket (dislocation).   Torn cartilage of the hip joint.   Swelling (inflammation) of a tendon (tendonitis), the sac within the hip joint (bursitis), or a joint.   A weakening in the abdominal wall (hernia), affecting the nerves to the hip.   Arthritis in the hip joint or lining of the hip joint.   Pinched nerves in the back, hip, or upper thigh.   A bulging disc in the spine (herniated disc).   Rarely, bone infection or cancer.  DIAGNOSIS   The location of your hip pain will help your caregiver understand what may be causing the pain. A diagnosis is based on your medical history, your symptoms, results from your physical exam, and results from diagnostic tests. Diagnostic tests may include X-ray exams, a computerized magnetic scan (magnetic resonance imaging, MRI), or bone scan.  TREATMENT   Treatment will depend on the cause of your hip pain. Treatment may include:   Limiting activities and resting until symptoms improve.   Crutches or other walking supports (a cane or brace).   Ice, elevation, and compression.   Physical therapy or home exercises.   Shoe inserts or special  shoes.   Losing weight.   Medications to reduce pain.   Undergoing surgery.  HOME CARE INSTRUCTIONS    Only take over-the-counter or prescription medicines for pain, discomfort, or fever as directed by your caregiver.   Put ice on the injured area:   Put ice in a plastic bag.   Place a towel between your skin and the bag.   Leave the ice on for 15-20 minutes at a time, 03-04 times a day.   Keep your leg raised (elevated) when possible to lessen swelling.   Avoid activities that cause pain.   Follow specific exercises as directed by your caregiver.   Sleep with a pillow between your legs on your most comfortable side.   Record how often you have hip pain, the location of the pain, and what it feels like. This information may be helpful to you and your caregiver.   Ask your caregiver about returning to work or sports and whether you should drive.   Follow up with your caregiver for further exams, therapy, or testing as directed.  SEEK MEDICAL CARE IF:    Your pain or swelling continues or worsens after 1 week.   You are feeling unwell or have chills.   You have increasing difficulty with walking.   You have a loss of sensation or other new symptoms.   You have questions or concerns.  SEEK   IMMEDIATE MEDICAL CARE IF:    You cannot put weight on the affected hip.   You have fallen.   You have a sudden increase in pain and swelling in your hip.   You have a fever.  MAKE SURE YOU:    Understand these instructions.   Will watch your condition.   Will get help right away if you are not doing well or get worse.  Document Released: 01/17/2010 Document Revised: 10/22/2011 Document Reviewed: 01/17/2010  [employer] Patient Information 2014 [employer]

## 2013-08-18 NOTE — Progress Notes (Signed)
   Subjective:    Patient ID: Valerie Baker, female    DOB: 1955/11/02, 58 y.o.   MRN: [id number]  HPI This 58 y.o. female presents for evaluation of bursitis of left hip.  She is requesting an injection of her Left hip.  She has hx of htn, hyperlipidemia,GAD, and hip pain.  She has been having a lot of hip pain and Today it is severe.  She has hx of DDD of the LS spine and has had back surgery in the past.   Review of Systems C/o left hip pain   No chest pain, SOB, HA, dizziness, vision change, N/V, diarrhea, constipation, dysuria, urinary urgency or frequency, or rash.  Objective:   Physical Exam Vital signs noted  Well developed well nourished female.  HEENT - Head atraumatic Normocephalic                Eyes - PERRLA, Conjuctiva - clear Sclera- Clear EOMI                Ears - EAC's Wnl TM's Wnl Gross Hearing WNL                Nose - Nares patent                 Throat - oropharanx wnl Respiratory - Lungs CTA bilateral Cardiac - RRR S1 and S2 without murmur GI - Abdomen soft Nontender and bowel sounds active x 4 Extremities - No edema. Neuro - Grossly intact. MS - TTP left hip.  Procedure - Left hip prepped with betadine and ETOH and then left greater trochanter bursae injected With one cc of lidocaine and one cc of depomedrol $RemoveBefor'80mg'krIvaXLVONFK$  and patient expresses relief.     Assessment & Plan:  Hypertension - Plan: CMP14+EGFR, amLODipine (NORVASC) 10 MG tablet  Hyperlipemia - Plan: Lipid panel, CMP14+EGFR  Fatigue - Plan: POCT CBC, Thyroid Panel With TSH, Vit D  25 hydroxy (rtn osteoporosis monitoring), CANCELED: Vit D  25 hydroxy (rtn osteoporosis monitoring)  Osteopenia - Plan: Vit D  25 hydroxy (rtn osteoporosis monitoring), CANCELED: Vit D  25 hydroxy (rtn osteoporosis monitoring)  Insomnia - Plan: traZODone (DESYREL) 100 MG tablet  Hiatal hernia - Plan: dexlansoprazole (DEXILANT) 60 MG capsule  Urinary frequency - Plan: oxybutynin (DITROPAN) 5 MG  tablet  Depression - Plan: citalopram (CELEXA) 20 MG tablet  Hip pain, acute, left - Plan: methylPREDNIsolone (MEDROL DOSPACK) 4 MG tablet Left greater trochanter bursae injection.  Lysbeth Penner FNP

## 2013-08-19 LAB — CMP14+EGFR
ALT: 21 IU/L (ref 0–32)
AST: 22 IU/L (ref 0–40)
Albumin/Globulin Ratio: 1.7 (ref 1.1–2.5)
Albumin: 4.2 g/dL (ref 3.5–5.5)
Alkaline Phosphatase: 153 IU/L — ABNORMAL HIGH (ref 39–117)
BUN/Creatinine Ratio: 29 — ABNORMAL HIGH (ref 9–23)
BUN: 19 mg/dL (ref 6–24)
CO2: 26 mmol/L (ref 18–29)
Calcium: 9.3 mg/dL (ref 8.7–10.2)
Chloride: 99 mmol/L (ref 97–108)
Creatinine, Ser: 0.66 mg/dL (ref 0.57–1.00)
GFR calc Af Amer: 113 mL/min/{1.73_m2} (ref >59)
GFR calc non Af Amer: 98 mL/min/{1.73_m2} (ref >59)
Globulin, Total: 2.5 g/dL (ref 1.5–4.5)
Glucose: 82 mg/dL (ref 65–99)
Potassium: 4.7 mmol/L (ref 3.5–5.2)
Sodium: 141 mmol/L (ref 134–144)
Total Bilirubin: 0.3 mg/dL (ref 0.0–1.2)
Total Protein: 6.7 g/dL (ref 6.0–8.5)

## 2013-08-19 LAB — LIPID PANEL
Chol/HDL Ratio: 2.6 ratio units (ref 0.0–4.4)
Cholesterol, Total: 235 mg/dL — ABNORMAL HIGH (ref 100–199)
HDL: 90 mg/dL (ref >39)
LDL Calculated: 126 mg/dL — ABNORMAL HIGH (ref 0–99)
Triglycerides: 96 mg/dL (ref 0–149)
VLDL Cholesterol Cal: 19 mg/dL (ref 5–40)

## 2013-08-19 LAB — THYROID PANEL WITH TSH
Free Thyroxine Index: 2 (ref 1.2–4.9)
T3 Uptake Ratio: 32 % (ref 24–39)
T4, Total: 6.3 ug/dL (ref 4.5–12.0)
TSH: 1.93 u[IU]/mL (ref 0.450–4.500)

## 2014-02-12 ENCOUNTER — Other Ambulatory Visit: Payer: Self-pay | Admitting: Family Medicine

## 2014-02-15 NOTE — Telephone Encounter (Signed)
Last seen 1/15  [facility] [place]  IF approved route to nurse to call in to [facility]

## 2014-02-16 ENCOUNTER — Other Ambulatory Visit: Payer: Self-pay | Admitting: Family Medicine

## 2014-03-12 ENCOUNTER — Other Ambulatory Visit: Payer: Self-pay | Admitting: Orthopaedic Surgery

## 2014-03-12 DIAGNOSIS — M961 Postlaminectomy syndrome, not elsewhere classified: Secondary | ICD-10-CM

## 2014-03-18 ENCOUNTER — Ambulatory Visit
Admission: RE | Admit: 2014-03-18 | Discharge: 2014-03-18 | Disposition: A | Discharge status: Home / Self Care | Payer: Medicare Other | Source: Ambulatory Visit | Attending: Orthopaedic Surgery | Admitting: Orthopaedic Surgery

## 2014-03-18 VITALS — BP 124/69 | HR 54

## 2014-03-18 DIAGNOSIS — M961 Postlaminectomy syndrome, not elsewhere classified: Secondary | ICD-10-CM

## 2014-03-18 MED ORDER — ONDANSETRON HCL 4 MG/2ML IJ SOLN
4.0000 mg | Freq: Once | INTRAMUSCULAR | Status: AC
  Administered 2014-03-18: 4 mg via INTRAMUSCULAR

## 2014-03-18 MED ORDER — DIAZEPAM 5 MG PO TABS
10.0000 mg | ORAL_TABLET | Freq: Once | ORAL | Status: AC
  Administered 2014-03-18: 10 mg via ORAL

## 2014-03-18 MED ORDER — IOHEXOL 180 MG/ML  SOLN
15.0000 mL | Freq: Once | INTRAMUSCULAR | Status: AC | PRN
  Administered 2014-03-18: 15 mL via INTRATHECAL

## 2014-03-18 MED ORDER — MEPERIDINE HCL 100 MG/ML IJ SOLN
100.0000 mg | Freq: Once | INTRAMUSCULAR | Status: AC
  Administered 2014-03-18: 100 mg via INTRAMUSCULAR

## 2014-03-18 NOTE — Progress Notes (Signed)
Pt states she has been off Trazodone and Citalopram for the past 2-3 days. Discharge instructions explained to pt.

## 2014-03-18 NOTE — Discharge Instructions (Signed)
Myelogram Discharge Instructions  1. Go home and rest quietly for the next 24 hours.  It is important to lie flat for the next 24 hours.  Get up only to go to the restroom.  You may lie in the bed or on a couch on your back, your stomach, your left side or your right side.  You may have one pillow under your head.  You may have pillows between your knees while you are on your side or under your knees while you are on your back.  2. DO NOT drive today.  Recline the seat as far back as it will go, while still wearing your seat belt, on the way home.  3. You may get up to go to the bathroom as needed.  You may sit up for 10 minutes to eat.  You may resume your normal diet and medications unless otherwise indicated.  Drink lots of extra fluids today and tomorrow.  4. The incidence of headache, nausea, or vomiting is about 5% (one in 20 patients).  If you develop a headache, lie flat and drink plenty of fluids until the headache goes away.  Caffeinated beverages may be helpful.  If you develop severe nausea and vomiting or a headache that does not go away with flat bed rest, call [phone].  5. You may resume normal activities after your 24 hours of bed rest is over; however, do not exert yourself strongly or do any heavy lifting tomorrow. If when you get up you have a headache when standing, go back to bed and force fluids for another 24 hours.  6. Call your physician for a follow-up appointment.  The results of your myelogram will be sent directly to your physician by the following day.  7. If you have any questions or if complications develop after you arrive home, please call [phone].  Discharge instructions have been explained to the patient.  The patient, or the person responsible for the patient, fully understands these instructions.      May resume Trazodone and Citalopram on Aug. 7, 2015, after 9:30 am.

## 2014-04-14 ENCOUNTER — Other Ambulatory Visit: Payer: Self-pay | Admitting: Family Medicine

## 2014-04-15 NOTE — Telephone Encounter (Signed)
Last ov 1/15. ntbs Last refill 03/15/14. If approved call to [facility]. Route to nurse pool.

## 2014-05-06 ENCOUNTER — Other Ambulatory Visit: Payer: Self-pay | Admitting: Orthopedic Surgery

## 2014-05-06 DIAGNOSIS — M25552 Pain in left hip: Secondary | ICD-10-CM

## 2014-05-11 ENCOUNTER — Ambulatory Visit
Admission: RE | Admit: 2014-05-11 | Discharge: 2014-05-11 | Disposition: A | Discharge status: Home / Self Care | Payer: Medicare Other | Source: Ambulatory Visit | Attending: Orthopedic Surgery | Admitting: Orthopedic Surgery

## 2014-05-11 DIAGNOSIS — M25552 Pain in left hip: Secondary | ICD-10-CM

## 2014-06-15 ENCOUNTER — Other Ambulatory Visit: Payer: Self-pay | Admitting: Family Medicine

## 2014-06-16 NOTE — Telephone Encounter (Signed)
Last seen January 2015, last ordered 04/16/14 #30 no refills, if approved please call into [facility] [place]

## 2014-06-19 NOTE — Telephone Encounter (Signed)
rx called in

## 2014-07-15 ENCOUNTER — Other Ambulatory Visit: Payer: Self-pay | Admitting: General Practice

## 2014-08-26 ENCOUNTER — Encounter: Payer: Self-pay | Admitting: Family Medicine

## 2014-08-26 ENCOUNTER — Telehealth: Payer: Self-pay | Admitting: Family Medicine

## 2014-08-26 ENCOUNTER — Ambulatory Visit (INDEPENDENT_AMBULATORY_CARE_PROVIDER_SITE_OTHER): Payer: Medicare Other | Admitting: Family Medicine

## 2014-08-26 VITALS — BP 150/85 | HR 87 | Temp 98.0°F | Ht 64.0 in | Wt 268.6 lb

## 2014-08-26 DIAGNOSIS — K449 Diaphragmatic hernia without obstruction or gangrene: Secondary | ICD-10-CM

## 2014-08-26 DIAGNOSIS — R32 Unspecified urinary incontinence: Secondary | ICD-10-CM

## 2014-08-26 DIAGNOSIS — F329 Major depressive disorder, single episode, unspecified: Secondary | ICD-10-CM

## 2014-08-26 DIAGNOSIS — F32A Depression, unspecified: Secondary | ICD-10-CM

## 2014-08-26 DIAGNOSIS — G4700 Insomnia, unspecified: Secondary | ICD-10-CM

## 2014-08-26 DIAGNOSIS — I10 Essential (primary) hypertension: Secondary | ICD-10-CM | POA: Diagnosis not present

## 2014-08-26 DIAGNOSIS — R350 Frequency of micturition: Secondary | ICD-10-CM | POA: Diagnosis not present

## 2014-08-26 LAB — POCT URINALYSIS DIPSTICK
Bilirubin, UA: NEGATIVE
Glucose, UA: NEGATIVE
Ketones, UA: NEGATIVE
Leukocytes, UA: NEGATIVE
Nitrite, UA: NEGATIVE
Spec Grav, UA: 1.015
Urobilinogen, UA: NEGATIVE
pH, UA: 5

## 2014-08-26 LAB — POCT UA - MICROSCOPIC ONLY
Casts, Ur, LPF, POC: NEGATIVE
Crystals, Ur, HPF, POC: NEGATIVE
Yeast, UA: NEGATIVE

## 2014-08-26 MED ORDER — DEXLANSOPRAZOLE 60 MG PO CPDR: 60.0000 mg | DELAYED_RELEASE_CAPSULE | Freq: Every day | ORAL | Status: DC

## 2014-08-26 MED ORDER — AMLODIPINE BESYLATE 10 MG PO TABS: 10.0000 mg | ORAL_TABLET | Freq: Every day | ORAL | Status: DC

## 2014-08-26 MED ORDER — OXYBUTYNIN CHLORIDE 5 MG PO TABS: 5.0000 mg | ORAL_TABLET | Freq: Two times a day (BID) | ORAL | Status: DC

## 2014-08-26 MED ORDER — TRAZODONE HCL 100 MG PO TABS: 100.0000 mg | ORAL_TABLET | Freq: Every day | ORAL | Status: DC

## 2014-08-26 MED ORDER — CITALOPRAM HYDROBROMIDE 20 MG PO TABS: 20.0000 mg | ORAL_TABLET | Freq: Every day | ORAL | Status: DC

## 2014-08-26 NOTE — Progress Notes (Signed)
   Subjective:    Patient ID: Valerie Baker, female    DOB: 08-22-1955, 59 y.o.   MRN: [id number]  HPI Patient is here for follow up.  She has hx of urinary incontinence.  She needs refills.  Review of Systems  Constitutional: Negative for fever.  HENT: Negative for ear pain.   Eyes: Negative for discharge.  Respiratory: Negative for cough.   Cardiovascular: Negative for chest pain.  Gastrointestinal: Negative for abdominal distention.  Endocrine: Negative for polyuria.  Genitourinary: Negative for difficulty urinating.  Musculoskeletal: Negative for gait problem and neck pain.  Skin: Negative for color change and rash.  Neurological: Negative for speech difficulty and headaches.  Psychiatric/Behavioral: Negative for agitation.       Objective:    BP 150/85 mmHg  Pulse 87  Temp(Src) 98 F (36.7 C) (Oral)  Ht $R'5\' 4"'al$  (1.626 m)  Wt 268 lb 9.6 oz (121.836 kg)  BMI 46.08 kg/m2 Physical Exam  Constitutional: She is oriented to person, place, and time. She appears well-developed and well-nourished.  HENT:  Head: Normocephalic and atraumatic.  Mouth/Throat: Oropharynx is clear and moist.  Eyes: Pupils are equal, round, and reactive to light.  Neck: Normal range of motion. Neck supple.  Cardiovascular: Normal rate and regular rhythm.   No murmur heard. Pulmonary/Chest: Effort normal and breath sounds normal.  Abdominal: Soft. Bowel sounds are normal. There is no tenderness.  Neurological: She is alert and oriented to person, place, and time.  Skin: Skin is warm and dry.  Psychiatric: She has a normal mood and affect.          Assessment & Plan:     ICD-9-CM ICD-10-CM   1. Urinary incontinence, unspecified incontinence type 788.30 R32 POCT UA - Microscopic Only     POCT urinalysis dipstick  2. Urinary frequency 788.41 R35.0 oxybutynin (DITROPAN) 5 MG tablet  3. Hiatal hernia 553.3 K44.9 dexlansoprazole (DEXILANT) 60 MG capsule  4. Depression 311 F32.9 citalopram  (CELEXA) 20 MG tablet  5. Essential hypertension 401.9 I10 amLODipine (NORVASC) 10 MG tablet     POCT CBC     CMP14+EGFR     TSH  6. Insomnia 780.52 G47.00 traZODone (DESYREL) 100 MG tablet     No Follow-up on file.  Lysbeth Penner FNP

## 2014-08-26 NOTE — Addendum Note (Signed)
Addended by: Earlene Plater on: 08/26/2014 11:46 AM   Modules accepted: Miquel Dunn

## 2014-08-26 NOTE — Telephone Encounter (Signed)
Valerie Baker will come in 06-28-15 to have labs drawn ordered on 06-28-15

## 2014-08-27 ENCOUNTER — Other Ambulatory Visit (INDEPENDENT_AMBULATORY_CARE_PROVIDER_SITE_OTHER): Payer: Medicare Other

## 2014-08-27 DIAGNOSIS — R32 Unspecified urinary incontinence: Secondary | ICD-10-CM | POA: Diagnosis not present

## 2014-08-27 DIAGNOSIS — I10 Essential (primary) hypertension: Secondary | ICD-10-CM

## 2014-08-27 NOTE — Progress Notes (Signed)
Labs only

## 2014-08-27 NOTE — Progress Notes (Signed)
Labs drawn from yesterday

## 2014-08-28 LAB — CMP14+EGFR
ALT: 18 IU/L (ref 0–32)
AST: 21 IU/L (ref 0–40)
Albumin/Globulin Ratio: 1.7 (ref 1.1–2.5)
Albumin: 4.2 g/dL (ref 3.5–5.5)
Alkaline Phosphatase: 152 IU/L — ABNORMAL HIGH (ref 39–117)
BUN/Creatinine Ratio: 20 (ref 9–23)
BUN: 17 mg/dL (ref 6–24)
CO2: 25 mmol/L (ref 18–29)
Calcium: 9.5 mg/dL (ref 8.7–10.2)
Chloride: 99 mmol/L (ref 97–108)
Creatinine, Ser: 0.85 mg/dL (ref 0.57–1.00)
GFR calc Af Amer: 87 mL/min/{1.73_m2} (ref >59)
GFR calc non Af Amer: 76 mL/min/{1.73_m2} (ref >59)
Globulin, Total: 2.5 g/dL (ref 1.5–4.5)
Glucose: 81 mg/dL (ref 65–99)
Potassium: 4.3 mmol/L (ref 3.5–5.2)
Sodium: 140 mmol/L (ref 134–144)
Total Bilirubin: 0.4 mg/dL (ref 0.0–1.2)
Total Protein: 6.7 g/dL (ref 6.0–8.5)

## 2014-08-28 LAB — TSH: TSH: 3.93 u[IU]/mL (ref 0.450–4.500)

## 2014-09-20 ENCOUNTER — Ambulatory Visit (INDEPENDENT_AMBULATORY_CARE_PROVIDER_SITE_OTHER): Payer: Medicare Other

## 2014-09-20 ENCOUNTER — Other Ambulatory Visit: Payer: Self-pay | Admitting: Family Medicine

## 2014-09-20 ENCOUNTER — Ambulatory Visit (INDEPENDENT_AMBULATORY_CARE_PROVIDER_SITE_OTHER): Payer: Medicare Other | Admitting: Family Medicine

## 2014-09-20 ENCOUNTER — Encounter: Payer: Self-pay | Admitting: Family Medicine

## 2014-09-20 VITALS — BP 147/77 | HR 86 | Temp 96.8°F | Ht 64.0 in | Wt 260.0 lb

## 2014-09-20 DIAGNOSIS — M5440 Lumbago with sciatica, unspecified side: Secondary | ICD-10-CM

## 2014-09-20 DIAGNOSIS — R1032 Left lower quadrant pain: Secondary | ICD-10-CM | POA: Diagnosis not present

## 2014-09-20 LAB — POCT UA - MICROSCOPIC ONLY
Casts, Ur, LPF, POC: NEGATIVE
Crystals, Ur, HPF, POC: NEGATIVE
Mucus, UA: NEGATIVE
RBC, urine, microscopic: NEGATIVE
WBC, Ur, HPF, POC: NEGATIVE
Yeast, UA: NEGATIVE

## 2014-09-20 LAB — POCT URINALYSIS DIPSTICK
Glucose, UA: NEGATIVE
Ketones, UA: NEGATIVE
Leukocytes, UA: NEGATIVE
Nitrite, UA: NEGATIVE
Protein, UA: NEGATIVE
Spec Grav, UA: 1.01
Urobilinogen, UA: NEGATIVE
pH, UA: 8.5

## 2014-09-20 MED ORDER — PREDNISONE 10 MG PO TABS: ORAL_TABLET | ORAL | Status: DC

## 2014-09-20 NOTE — Progress Notes (Signed)
   Subjective:    Patient ID: Valerie Baker, female    DOB: 1955-12-18, 59 y.o.   MRN: [id number]  HPI Patient c/o left back pain radiating to her saddle area and down her left leg.  She c/o left abdominal pain and pain in her vaginal area.  She has had problems with kidney stones and DDD of the LS spine.  Review of Systems  Constitutional: Negative for fever.  HENT: Negative for ear pain.   Eyes: Negative for discharge.  Respiratory: Negative for cough.   Cardiovascular: Negative for chest pain.  Gastrointestinal: Negative for abdominal distention.  Endocrine: Negative for polyuria.  Genitourinary: Negative for difficulty urinating.  Musculoskeletal: Negative for gait problem and neck pain.  Skin: Negative for color change and rash.  Neurological: Negative for speech difficulty and headaches.  Psychiatric/Behavioral: Negative for agitation.   KUB - No Kidney stone LS xray - No fx Prelimnary reading by Gwyndolyn Saxon Shaylynne Lunt,FNP     Objective:    BP 147/77 mmHg  Pulse 86  Temp(Src) 96.8 F (36 C) (Oral)  Ht 5\' 4"  (1.626 m)  Wt 260 lb (117.935 kg)  BMI 44.61 kg/m2 Physical Exam  Constitutional: She is oriented to person, place, and time. She appears well-developed and well-nourished.  HENT:  Head: Normocephalic and atraumatic.  Mouth/Throat: Oropharynx is clear and moist.  Eyes: Pupils are equal, round, and reactive to light.  Neck: Normal range of motion. Neck supple.  Cardiovascular: Normal rate and regular rhythm.   No murmur heard. Pulmonary/Chest: Effort normal and breath sounds normal.  Abdominal: Soft. Bowel sounds are normal. There is no tenderness.  Genitourinary: Guaiac positive stool.  Neurological: She is alert and oriented to person, place, and time.  Skin: Skin is warm and dry.  Psychiatric: She has a normal mood and affect.          Assessment & Plan:     ICD-9-CM ICD-10-CM   1. Left lower quadrant pain 789.04 R10.32 DG Abd 1 View     DG Lumbar  Spine 2-3 Views     POCT UA - Microalbumin     POCT urinalysis dipstick  2. Midline low back pain with sciatica, sciatica laterality unspecified 724.3 M54.40 DG Abd 1 View     DG Lumbar Spine 2-3 Views     POCT UA - Microalbumin     POCT urinalysis dipstick   Prednisone Taper 10mg  4 po qdx2d then 3po qdx2d then 2poqdx2dy then 1poqdx2d then stop #20  Discussed that the likely etiology is her DDD in her back and if not improving then follow up.  No Follow-up on file.  Lysbeth Penner FNP

## 2014-09-23 DIAGNOSIS — M47812 Spondylosis without myelopathy or radiculopathy, cervical region: Secondary | ICD-10-CM | POA: Diagnosis not present

## 2014-09-29 DIAGNOSIS — M25551 Pain in right hip: Secondary | ICD-10-CM | POA: Diagnosis not present

## 2014-10-12 ENCOUNTER — Telehealth: Payer: Self-pay | Admitting: Family Medicine

## 2014-10-12 DIAGNOSIS — Z780 Asymptomatic menopausal state: Secondary | ICD-10-CM

## 2014-10-12 DIAGNOSIS — M84350A Stress fracture, pelvis, initial encounter for fracture: Secondary | ICD-10-CM | POA: Diagnosis not present

## 2014-10-12 DIAGNOSIS — M169 Osteoarthritis of hip, unspecified: Secondary | ICD-10-CM | POA: Diagnosis not present

## 2014-10-13 NOTE — Telephone Encounter (Signed)
Please schedule a DEXA appointment for this patient

## 2014-10-14 NOTE — Telephone Encounter (Signed)
Dexa scan ordered. 

## 2014-11-03 ENCOUNTER — Other Ambulatory Visit: Payer: Self-pay

## 2014-11-03 ENCOUNTER — Encounter: Payer: Self-pay | Admitting: Pharmacist

## 2014-11-03 ENCOUNTER — Ambulatory Visit (INDEPENDENT_AMBULATORY_CARE_PROVIDER_SITE_OTHER): Payer: Medicare Other

## 2014-11-03 ENCOUNTER — Ambulatory Visit (INDEPENDENT_AMBULATORY_CARE_PROVIDER_SITE_OTHER): Payer: Medicare Other | Admitting: Pharmacist

## 2014-11-03 VITALS — BP 138/78 | HR 70 | Ht 64.0 in | Wt 260.0 lb

## 2014-11-03 DIAGNOSIS — M84350G Stress fracture, pelvis, subsequent encounter for fracture with delayed healing: Secondary | ICD-10-CM

## 2014-11-03 DIAGNOSIS — Z780 Asymptomatic menopausal state: Secondary | ICD-10-CM | POA: Diagnosis not present

## 2014-11-03 DIAGNOSIS — Z0000 Encounter for general adult medical examination without abnormal findings: Secondary | ICD-10-CM

## 2014-11-03 DIAGNOSIS — M8580 Other specified disorders of bone density and structure, unspecified site: Secondary | ICD-10-CM | POA: Insufficient documentation

## 2014-11-03 DIAGNOSIS — Z23 Encounter for immunization: Secondary | ICD-10-CM

## 2014-11-03 DIAGNOSIS — M51379 Other intervertebral disc degeneration, lumbosacral region without mention of lumbar back pain or lower extremity pain: Secondary | ICD-10-CM | POA: Insufficient documentation

## 2014-11-03 DIAGNOSIS — M84350A Stress fracture, pelvis, initial encounter for fracture: Secondary | ICD-10-CM

## 2014-11-03 DIAGNOSIS — M5137 Other intervertebral disc degeneration, lumbosacral region: Secondary | ICD-10-CM | POA: Insufficient documentation

## 2014-11-03 HISTORY — DX: Stress fracture, pelvis, initial encounter for fracture: M84.350A

## 2014-11-03 MED ORDER — RALOXIFENE HCL 60 MG PO TABS: 60.0000 mg | ORAL_TABLET | Freq: Every day | ORAL | Status: DC

## 2014-11-03 NOTE — Progress Notes (Signed)
Patient ID: Valerie Baker, female   DOB: [birth year], 59 y.o.   MRN: [id number]    Subjective:   Valerie Baker is a 59 y.o. female who presents for an Initial Medicare Annual Wellness Visit  Current Medications (verified) Outpatient Encounter Prescriptions as of 11/03/2014  Medication Sig  . amLODipine (NORVASC) 10 MG tablet Take 1 tablet (10 mg total) by mouth daily. (Patient taking differently: Take 5 mg by mouth daily. )  . baclofen (LIORESAL) 20 MG tablet Take 20 mg by mouth 3 (three) times daily as needed for muscle spasms.  . Calcium Carbonate-Vitamin D (CALCIUM 600+D) 600-400 MG-UNIT per tablet Take 1 tablet by mouth 2 (two) times daily.    . Cholecalciferol (VITAMIN D3) 2000 UNITS TABS Take 1 tablet by mouth daily.    . citalopram (CELEXA) 20 MG tablet Take 1 tablet (20 mg total) by mouth daily.  Marland Kitchen dexlansoprazole (DEXILANT) 60 MG capsule Take 1 capsule (60 mg total) by mouth daily.  Marland Kitchen gabapentin (NEURONTIN) 300 MG capsule Take 300 mg by mouth 3 (three) times daily.  Marland Kitchen loratadine (CLARITIN) 10 MG tablet Take 10 mg by mouth daily as needed for allergies.  . Multiple Vitamin (MULTIVITAMIN) tablet Take 1 tablet by mouth daily.   Marland Kitchen oxybutynin (DITROPAN) 5 MG tablet Take 1 tablet (5 mg total) by mouth 2 (two) times daily.  Marland Kitchen oxyCODONE-acetaminophen (PERCOCET/ROXICET) 5-325 MG per tablet Take by mouth every 4 (four) hours as needed for severe pain.  . traZODone (DESYREL) 100 MG tablet Take 1 tablet (100 mg total) by mouth at bedtime.  . VENTOLIN HFA 108 (90 BASE) MCG/ACT inhaler USE 2 PUFFS EVERY 6 HOURS AS NEEDED  . NONFORMULARY OR COMPOUNDED ITEM Compounded cream for back pain.  . raloxifene (EVISTA) 60 MG tablet Take 1 tablet (60 mg total) by mouth daily.  . [DISCONTINUED] ALPRAZolam (XANAX) 0.25 MG tablet Take 0.25 mg by mouth daily.    . [DISCONTINUED] B Complex-C-Folic Acid (B COMPLEX + C TR PO) Take 1 capsule by mouth daily.    . [DISCONTINUED] methocarbamol (ROBAXIN) 750  MG tablet Take 750 mg by mouth every 6 (six) hours as needed for muscle spasms.  . [DISCONTINUED] Omega-3 Fatty Acids (FISH OIL) 1000 MG CAPS Take 1 capsule by mouth 2 (two) times daily.    . [DISCONTINUED] predniSONE (DELTASONE) 10 MG tablet Take 4 po qd x 2 days then 3 po qd x 2 days then 2 po qd x 2 days then 1po qd x 2 days (Patient not taking: Reported on 11/03/2014)    Allergies (verified) Sulfonamide derivatives   History: Past Medical History  Diagnosis Date  . Hypertension   . GERD (gastroesophageal reflux disease)   . Asthma   . Migraines   . Anxiety and depression   . Arthritis   . Adenomatous colon polyp 06/2006  . Hyperlipidemia   . Fibromyalgia   . IBS (irritable bowel syndrome)   . Kidney stones   . Obesity   . Allergy     SINUS  . Anxiety   . Blood transfusion   . Depression   . Osteopenia   . Cataract   . Bladder incontinence    Past Surgical History  Procedure Laterality Date  . Back surgery  2000  . Total knee arthroplasty  [id number]  . Abdominal hysterectomy    . Appendectomy    . Nissen fundoplication  [id number]  . Nasal sinus surgery  2003  . Debridement tennis elbow    .  Lumbar fusion  2007  . Spinal cord stimulator implant  2005  . Lumbar fusion  2002,2003,2007,2008  . Rotator cuff repair  [id number]  . Foot surgery  1994    right  . Elbow surgery  2003  . Eye surgery     Family History  Problem Relation Age of Onset  . Heart disease Mother   . Diabetes Mother   . Melanoma Mother     nose  . Diabetes Brother   . Cirrhosis Brother   . Hypertension Brother   . Stroke Father   . Hypertension Sister   . Hypertension Brother    Social History   Occupational History  . Disabled    Social History Main Topics  . Smoking status: Former Smoker -- 5 years    Quit date: 11/02/2005  . Smokeless tobacco: Never Used  . Alcohol Use: No  . Drug Use: No  . Sexual Activity: Yes    Do you feel safe at home?  Yes  Dietary issues and exercise  activities: Current Exercise Habits:: The patient does not participate in regular exercise at present (has lots of back and neck pain)  Current Dietary habits:   Not following any special diet but would like to lose weight   Objective:    Today's Vitals   11/03/14 1533  BP: 138/78  Pulse: 70  Height: 5\' 4"  (1.626 m)  Weight: 260 lb (117.935 kg)   Body mass index is 44.61 kg/(m^2).  Activities of Daily Living In your present state of health, do you have any difficulty performing the following activities: 11/03/2014  Is the patient deaf or have difficulty hearing? N  Hearing N  Vision N  Difficulty concentrating or making decisions Y  Walking or climbing stairs? N  Doing errands, shopping? N  Preparing Food and eating ? Y  Using the Toilet? N  In the past six months, have you accidently leaked urine? N  Do you have problems with loss of bowel control? N  Managing your Medications? N  Managing your Finances? N  Housekeeping or managing your Housekeeping? N    Are there smokers in your home (other than you)? No   Cardiac Risk Factors include: advanced age (>74men, >23 women);family history of premature cardiovascular disease;hypertension;obesity (BMI >30kg/m2);sedentary lifestyle  Depression Screen PHQ 2/9 Scores 11/03/2014  PHQ - 2 Score 1    Fall Risk Fall Risk  11/03/2014  Falls in the past year? No    Cognitive Function: MMSE - Mini Mental State Exam 11/03/2014  Orientation to time 5  Orientation to Place 5  Registration 3  Attention/ Calculation 5  Recall 3  Language- name 2 objects 2  Language- repeat 1  Language- follow 3 step command 3  Language- read & follow direction 1  Write a sentence 1  Copy design 1  Total score 30    Immunizations and Health Maintenance Immunization History  Administered Date(s) Administered  . Influenza-Unspecified 05/13/2013, 05/26/2014   Health Maintenance Due  Topic Date Due  . HIV Screening  04/22/1971  . PAP SMEAR   04/21/1974  . TETANUS/TDAP  04/22/1975  . MAMMOGRAM  04/21/2006    Patient Care Team: Chipper Herb, MD as PCP - General (Family Medicine)  Indicate any recent Medical Services you may have received from other than [facility] providers in the past year (date may be approximate).    Assessment:    Annual Wellness Visit  Obesity Osteopenia with history of fracture  Screening Tests  Health Maintenance  Topic Date Due  . HIV Screening  04/22/1971  . PAP SMEAR  04/21/1974  . TETANUS/TDAP  04/22/1975  . MAMMOGRAM  04/21/2006  . INFLUENZA VACCINE  03/14/2015  . COLONOSCOPY  06/11/2016  . DEXA SCAN  11/02/2016        Plan:   During the course of the visit Valerie Baker was educated and counseled about the following appropriate screening and preventive services:   Vaccines to include Pneumoccal, Influenza, Hepatitis B, Td, Zostavax - Tdap given in office today.  UTD or other vaccine not required yet.  Colorectal cancer screening - UTD  Cardiovascular disease screening - UTD  Diabetes screening - UTD  Bone Denisty / Osteoporosis Screening - done today  Mammogram - referral sent tdoay to [facility]  PAP - needed but patient refused   Glaucoma screening / Eye Exam - UTD  Nutrition counseling - discussed limiting high calorie foods and increasing fruits and vegetables.  Increase lean proteins and whole grains.  Handout given.  Advanced Directives - UTD, requested copy  Vitamin D recommended at next blood draw  Start evista 60mg  1 tablet daily  Start exercise - weight bearing is difficult for patient due to back pain.  Recommended look into [employer] and doing water aerobics  Fall prevention discussed     Goals    . Increase physical activity    . Reduce calorie intake to 2000 calories per day      Increase non-starchy vegetables - carrots, green bean, squash, zucchini, tomatoes, onions, peppers, spinach and other green leafy vegetables, cabbage, lettuce, cucumbers,  asparagus, okra (not fried), eggplant limit sugar and processed foods (cakes, cookies, ice cream, crackers and chips) Increase fresh fruit but limit serving sizes 1/2 cup or about the size of tennis or baseball No fruit juice or soda limit red meat to no more than 1-2 times per week (serving size about the size of your palm) Choose whole grains / lean proteins - whole wheat bread, quinoa, whole grain rice (1/2 cup), fish, chicken, [place]         Patient Instructions (the written plan) were given to the patient.   Cherre Robins, [facility]   11/03/2014

## 2014-11-03 NOTE — Patient Instructions (Signed)
Sent referral for mammogram to [facility] and health care power of attorney information can be sent to [email or web address]  [phone]    Fall Prevention and Home Safety Falls cause injuries and can affect all age groups. It is possible to use preventive measures to significantly decrease the likelihood of falls. There are many simple measures which can make your home safer and prevent falls. OUTDOORS  Repair cracks and edges of walkways and driveways.  Remove high doorway thresholds.  Trim shrubbery on the main path into your home.  Have good outside lighting.  Clear walkways of tools, rocks, debris, and clutter.  Check that handrails are not broken and are securely fastened. Both sides of steps should have handrails.  Have leaves, snow, and ice cleared regularly.  Use sand or salt on walkways during winter months.  In the garage, clean up grease or oil spills. BATHROOM  Install night lights.  Install grab bars by the toilet and in the tub and shower.  Use non-skid mats or decals in the tub or shower.  Place a plastic non-slip stool in the shower to sit on, if needed.  Keep floors dry and clean up all water on the floor immediately.  Remove soap buildup in the tub or shower on a regular basis.  Secure bath mats with non-slip, double-sided rug tape.  Remove throw rugs and tripping hazards from the floors. BEDROOMS  Install night lights.  Make sure a bedside light is easy to reach.  Do not use oversized bedding.  Keep a telephone by your bedside.  Have a firm chair with side arms to use for getting dressed.  Remove throw rugs and tripping hazards from the floor. KITCHEN  Keep handles on pots and pans turned toward the center of the stove. Use back burners when possible.  Clean up spills quickly and allow time for drying.  Avoid walking on wet floors.  Avoid hot utensils and knives.  Position shelves so they are not too high or  low.  Place commonly used objects within easy reach.  If necessary, use a sturdy step stool with a grab bar when reaching.  Keep electrical cables out of the way.  Do not use floor polish or wax that makes floors slippery. If you must use wax, use non-skid floor wax.  Remove throw rugs and tripping hazards from the floor. STAIRWAYS  Never leave objects on stairs.  Place handrails on both sides of stairways and use them. Fix any loose handrails. Make sure handrails on both sides of the stairways are as long as the stairs.  Check carpeting to make sure it is firmly attached along stairs. Make repairs to worn or loose carpet promptly.  Avoid placing throw rugs at the top or bottom of stairways, or properly secure the rug with carpet tape to prevent slippage. Get rid of throw rugs, if possible.  Have an electrician put in a light switch at the top and bottom of the stairs. OTHER FALL PREVENTION TIPS  Wear low-heel or rubber-soled shoes that are supportive and fit well. Wear closed toe shoes.  When using a stepladder, make sure it is fully opened and both spreaders are firmly locked. Do not climb a closed stepladder.  Add color or contrast paint or tape to grab bars and handrails in your home. Place contrasting color strips on first and last steps.  Learn and use mobility aids as needed. Install an electrical emergency response system.  Turn on lights  to avoid dark areas. Replace light bulbs that burn out immediately. Get light switches that glow.  Arrange furniture to create clear pathways. Keep furniture in the same place.  Firmly attach carpet with non-skid or double-sided tape.  Eliminate uneven floor surfaces.  Select a carpet pattern that does not visually hide the edge of steps.  Be aware of all pets. OTHER HOME SAFETY TIPS  Set the water temperature for 120 F (48.8 C).  Keep emergency numbers on or near the telephone.  Keep smoke detectors on every level of the  home and near sleeping areas. Document Released: 07/20/2002 Document Revised: 01/29/2012 Document Reviewed: 10/19/2011 [facility] Patient Information 2015 [place], [place]. This information is not intended to replace advice given to you by your health care provider. Make sure you discuss any questions you have with your health care provider.  Preventive Care for Adults A healthy lifestyle and preventive care can promote health and wellness. Preventive health guidelines for women include the following key practices.  A routine yearly physical is a good way to check with your health care provider about your health and preventive screening. It is a chance to share any concerns and updates on your health and to receive a thorough exam.  Visit your dentist for a routine exam and preventive care every 6 months. Brush your teeth twice a day and floss once a day. Good oral hygiene prevents tooth decay and gum disease.  The frequency of eye exams is based on your age, health, family medical history, use of contact lenses, and other factors. Follow your health care provider's recommendations for frequency of eye exams.  Eat a healthy diet. Foods like vegetables, fruits, whole grains, low-fat dairy products, and lean protein foods contain the nutrients you need without too many calories. Decrease your intake of foods high in solid fats, added sugars, and salt. Eat the right amount of calories for you.Get information about a proper diet from your health care provider, if necessary.  Regular physical exercise is one of the most important things you can do for your health. Most adults should get at least 150 minutes of moderate-intensity exercise (any activity that increases your heart rate and causes you to sweat) each week. In addition, most adults need muscle-strengthening exercises on 2 or more days a week.  Maintain a healthy weight. The body mass index (BMI) is a screening tool to identify possible weight  problems. It provides an estimate of body fat based on height and weight. Your health care provider can find your BMI and can help you achieve or maintain a healthy weight.For adults 20 years and older:  A BMI below 18.5 is considered underweight.  A BMI of 18.5 to 24.9 is normal.  A BMI of 25 to 29.9 is considered overweight.  A BMI of 30 and above is considered obese.  Maintain normal blood lipids and cholesterol levels by exercising and minimizing your intake of saturated fat. Eat a balanced diet with plenty of fruit and vegetables. Blood tests for lipids and cholesterol should begin at age 43 and be repeated every 5 years. If your lipid or cholesterol levels are high, you are over 50, or you are at high risk for heart disease, you may need your cholesterol levels checked more frequently.Ongoing high lipid and cholesterol levels should be treated with medicines if diet and exercise are not working.  If you smoke, find out from your health care provider how to quit. If you do not use tobacco, do not  start.  Lung cancer screening is recommended for adults aged 80-80 years who are at high risk for developing lung cancer because of a history of smoking. A yearly low-dose CT scan of the lungs is recommended for people who have at least a 30-pack-year history of smoking and are a current smoker or have quit within the past 15 years. A pack year of smoking is smoking an average of 1 pack of cigarettes a day for 1 year (for example: 1 pack a day for 30 years or 2 packs a day for 15 years). Yearly screening should continue until the smoker has stopped smoking for at least 15 years. Yearly screening should be stopped for people who develop a health problem that would prevent them from having lung cancer treatment.  If you are pregnant, do not drink alcohol. If you are breastfeeding, be very cautious about drinking alcohol. If you are not pregnant and choose to drink alcohol, do not have more than 1 drink  per day. One drink is considered to be 12 ounces (355 mL) of beer, 5 ounces (148 mL) of wine, or 1.5 ounces (44 mL) of liquor.  Avoid use of street drugs. Do not share needles with anyone. Ask for help if you need support or instructions about stopping the use of drugs.  High blood pressure causes heart disease and increases the risk of stroke. Your blood pressure should be checked at least every 1 to 2 years. Ongoing high blood pressure should be treated with medicines if weight loss and exercise do not work.  If you are 39-60 years old, ask your health care provider if you should take aspirin to prevent strokes.  Diabetes screening involves taking a blood sample to check your fasting blood sugar level. This should be done once every 3 years, after age 24, if you are within normal weight and without risk factors for diabetes. Testing should be considered at a younger age or be carried out more frequently if you are overweight and have at least 1 risk factor for diabetes.  Breast cancer screening is essential preventive care for women. You should practice "breast self-awareness." This means understanding the normal appearance and feel of your breasts and may include breast self-examination. Any changes detected, no matter how small, should be reported to a health care provider. Women in their 44s and 30s should have a clinical breast exam (CBE) by a health care provider as part of a regular health exam every 1 to 3 years. After age 73, women should have a CBE every year. Starting at age 81, women should consider having a mammogram (breast X-ray test) every year. Women who have a family history of breast cancer should talk to their health care provider about genetic screening. Women at a high risk of breast cancer should talk to their health care providers about having an MRI and a mammogram every year.  Breast cancer gene (BRCA)-related cancer risk assessment is recommended for women who have family  members with BRCA-related cancers. BRCA-related cancers include breast, ovarian, tubal, and peritoneal cancers. Having family members with these cancers may be associated with an increased risk for harmful changes (mutations) in the breast cancer genes BRCA1 and BRCA2. Results of the assessment will determine the need for genetic counseling and BRCA1 and BRCA2 testing.  Routine pelvic exams to screen for cancer are no longer recommended for nonpregnant women who are considered low risk for cancer of the pelvic organs (ovaries, uterus, and vagina) and who do not  have symptoms. Ask your health care provider if a screening pelvic exam is right for you.  If you have had past treatment for cervical cancer or a condition that could lead to cancer, you need Pap tests and screening for cancer for at least 20 years after your treatment. If Pap tests have been discontinued, your risk factors (such as having a new sexual partner) need to be reassessed to determine if screening should be resumed. Some women have medical problems that increase the chance of getting cervical cancer. In these cases, your health care provider may recommend more frequent screening and Pap tests.  The HPV test is an additional test that may be used for cervical cancer screening. The HPV test looks for the virus that can cause the cell changes on the cervix. The cells collected during the Pap test can be tested for HPV. The HPV test could be used to screen women aged 19 years and older, and should be used in women of any age who have unclear Pap test results. After the age of 13, women should have HPV testing at the same frequency as a Pap test.  Colorectal cancer can be detected and often prevented. Most routine colorectal cancer screening begins at the age of 64 years and continues through age 82 years. However, your health care provider may recommend screening at an earlier age if you have risk factors for colon cancer. On a yearly basis,  your health care provider may provide home test kits to check for hidden blood in the stool. Use of a small camera at the end of a tube, to directly examine the colon (sigmoidoscopy or colonoscopy), can detect the earliest forms of colorectal cancer. Talk to your health care provider about this at age 54, when routine screening begins. Direct exam of the colon should be repeated every 5-10 years through age 21 years, unless early forms of pre-cancerous polyps or small growths are found.  People who are at an increased risk for hepatitis B should be screened for this virus. You are considered at high risk for hepatitis B if:  You were born in a country where hepatitis B occurs often. Talk with your health care provider about which countries are considered high risk.  Your parents were born in a high-risk country and you have not received a shot to protect against hepatitis B (hepatitis B vaccine).  You have HIV or AIDS.  You use needles to inject street drugs.  You live with, or have sex with, someone who has hepatitis B.  You get hemodialysis treatment.  You take certain medicines for conditions like cancer, organ transplantation, and autoimmune conditions.  Hepatitis C blood testing is recommended for all people born from [birth year] through 1965 and any individual with known risks for hepatitis C.  Practice safe sex. Use condoms and avoid high-risk sexual practices to reduce the spread of sexually transmitted infections (STIs). STIs include gonorrhea, chlamydia, syphilis, trichomonas, herpes, HPV, and human immunodeficiency virus (HIV). Herpes, HIV, and HPV are viral illnesses that have no cure. They can result in disability, cancer, and death.  You should be screened for sexually transmitted illnesses (STIs) including gonorrhea and chlamydia if:  You are sexually active and are younger than 24 years.  You are older than 24 years and your health care provider tells you that you are at risk for  this type of infection.  Your sexual activity has changed since you were last screened and you are at an increased risk  for chlamydia or gonorrhea. Ask your health care provider if you are at risk.  If you are at risk of being infected with HIV, it is recommended that you take a prescription medicine daily to prevent HIV infection. This is called preexposure prophylaxis (PrEP). You are considered at risk if:  You are a heterosexual woman, are sexually active, and are at increased risk for HIV infection.  You take drugs by injection.  You are sexually active with a partner who has HIV.  Talk with your health care provider about whether you are at high risk of being infected with HIV. If you choose to begin PrEP, you should first be tested for HIV. You should then be tested every 3 months for as long as you are taking PrEP.  Osteoporosis is a disease in which the bones lose minerals and strength with aging. This can result in serious bone fractures or breaks. The risk of osteoporosis can be identified using a bone density scan. Women ages 61 years and over and women at risk for fractures or osteoporosis should discuss screening with their health care providers. Ask your health care provider whether you should take a calcium supplement or vitamin D to reduce the rate of osteoporosis.  Menopause can be associated with physical symptoms and risks. Hormone replacement therapy is available to decrease symptoms and risks. You should talk to your health care provider about whether hormone replacement therapy is right for you.  Use sunscreen. Apply sunscreen liberally and repeatedly throughout the day. You should seek shade when your shadow is shorter than you. Protect yourself by wearing long sleeves, pants, a wide-brimmed hat, and sunglasses year round, whenever you are outdoors.  Once a month, do a whole body skin exam, using a mirror to look at the skin on your back. Tell your health care provider of  new moles, moles that have irregular borders, moles that are larger than a pencil eraser, or moles that have changed in shape or color.  Stay current with required vaccines (immunizations).  Influenza vaccine. All adults should be immunized every year.  Tetanus, diphtheria, and acellular pertussis (Td, Tdap) vaccine. Pregnant women should receive 1 dose of Tdap vaccine during each pregnancy. The dose should be obtained regardless of the length of time since the last dose. Immunization is preferred during the 27th-36th week of gestation. An adult who has not previously received Tdap or who does not know her vaccine status should receive 1 dose of Tdap. This initial dose should be followed by tetanus and diphtheria toxoids (Td) booster doses every 10 years. Adults with an unknown or incomplete history of completing a 3-dose immunization series with Td-containing vaccines should begin or complete a primary immunization series including a Tdap dose. Adults should receive a Td booster every 10 years.  Varicella vaccine. An adult without evidence of immunity to varicella should receive 2 doses or a second dose if she has previously received 1 dose. Pregnant females who do not have evidence of immunity should receive the first dose after pregnancy. This first dose should be obtained before leaving the health care facility. The second dose should be obtained 4-8 weeks after the first dose.  Human papillomavirus (HPV) vaccine. Females aged 13-26 years who have not received the vaccine previously should obtain the 3-dose series. The vaccine is not recommended for use in pregnant females. However, pregnancy testing is not needed before receiving a dose. If a female is found to be pregnant after receiving a dose, no treatment  is needed. In that case, the remaining doses should be delayed until after the pregnancy. Immunization is recommended for any person with an immunocompromised condition through the age of 55  years if she did not get any or all doses earlier. During the 3-dose series, the second dose should be obtained 4-8 weeks after the first dose. The third dose should be obtained 24 weeks after the first dose and 16 weeks after the second dose.  Zoster vaccine. One dose is recommended for adults aged 41 years or older unless certain conditions are present.  Measles, mumps, and rubella (MMR) vaccine. Adults born before [birth year] generally are considered immune to measles and mumps. Adults born in [birth year] or later should have 1 or more doses of MMR vaccine unless there is a contraindication to the vaccine or there is laboratory evidence of immunity to each of the three diseases. A routine second dose of MMR vaccine should be obtained at least 28 days after the first dose for students attending postsecondary schools, health care workers, or international travelers. People who received inactivated measles vaccine or an unknown type of measles vaccine during 1963-1967 should receive 2 doses of MMR vaccine. People who received inactivated mumps vaccine or an unknown type of mumps vaccine before 1979 and are at high risk for mumps infection should consider immunization with 2 doses of MMR vaccine. For females of childbearing age, rubella immunity should be determined. If there is no evidence of immunity, females who are not pregnant should be vaccinated. If there is no evidence of immunity, females who are pregnant should delay immunization until after pregnancy. Unvaccinated health care workers born before [birth year] who lack laboratory evidence of measles, mumps, or rubella immunity or laboratory confirmation of disease should consider measles and mumps immunization with 2 doses of MMR vaccine or rubella immunization with 1 dose of MMR vaccine.  Pneumococcal 13-valent conjugate (PCV13) vaccine. When indicated, a person who is uncertain of her immunization history and has no record of immunization should receive the PCV13 vaccine.  An adult aged 60 years or older who has certain medical conditions and has not been previously immunized should receive 1 dose of PCV13 vaccine. This PCV13 should be followed with a dose of pneumococcal polysaccharide (PPSV23) vaccine. The PPSV23 vaccine dose should be obtained at least 8 weeks after the dose of PCV13 vaccine. An adult aged 50 years or older who has certain medical conditions and previously received 1 or more doses of PPSV23 vaccine should receive 1 dose of PCV13. The PCV13 vaccine dose should be obtained 1 or more years after the last PPSV23 vaccine dose.  Pneumococcal polysaccharide (PPSV23) vaccine. When PCV13 is also indicated, PCV13 should be obtained first. All adults aged 59 years and older should be immunized. An adult younger than age 52 years who has certain medical conditions should be immunized. Any person who resides in a nursing home or long-term care facility should be immunized. An adult smoker should be immunized. People with an immunocompromised condition and certain other conditions should receive both PCV13 and PPSV23 vaccines. People with human immunodeficiency virus (HIV) infection should be immunized as soon as possible after diagnosis. Immunization during chemotherapy or radiation therapy should be avoided. Routine use of PPSV23 vaccine is not recommended for American Indians, [place] Natives, or people younger than 65 years unless there are medical conditions that require PPSV23 vaccine. When indicated, people who have unknown immunization and have no record of immunization should receive PPSV23 vaccine. One-time revaccination 5 years after  the first dose of PPSV23 is recommended for people aged 19-64 years who have chronic kidney failure, nephrotic syndrome, asplenia, or immunocompromised conditions. People who received 1-2 doses of PPSV23 before age 23 years should receive another dose of PPSV23 vaccine at age 50 years or later if at least 5 years have passed since the  previous dose. Doses of PPSV23 are not needed for people immunized with PPSV23 at or after age 50 years.  Meningococcal vaccine. Adults with asplenia or persistent complement component deficiencies should receive 2 doses of quadrivalent meningococcal conjugate (MenACWY-D) vaccine. The doses should be obtained at least 2 months apart. Microbiologists working with certain meningococcal bacteria, [employer] recruits, people at risk during an outbreak, and people who travel to or live in countries with a high rate of meningitis should be immunized. A first-year college student up through age 33 years who is living in a residence hall should receive a dose if she did not receive a dose on or after her 16th birthday. Adults who have certain high-risk conditions should receive one or more doses of vaccine.  Hepatitis A vaccine. Adults who wish to be protected from this disease, have certain high-risk conditions, work with hepatitis A-infected animals, work in hepatitis A research labs, or travel to or work in countries with a high rate of hepatitis A should be immunized. Adults who were previously unvaccinated and who anticipate close contact with an international adoptee during the first 60 days after arrival in the [place] from a country with a high rate of hepatitis A should be immunized.  Hepatitis B vaccine. Adults who wish to be protected from this disease, have certain high-risk conditions, may be exposed to blood or other infectious body fluids, are household contacts or sex partners of hepatitis B positive people, are clients or workers in certain care facilities, or travel to or work in countries with a high rate of hepatitis B should be immunized.  Haemophilus influenzae type b (Hib) vaccine. A previously unvaccinated person with asplenia or sickle cell disease or having a scheduled splenectomy should receive 1 dose of Hib vaccine. Regardless of previous immunization, a recipient of a hematopoietic  stem cell transplant should receive a 3-dose series 6-12 months after her successful transplant. Hib vaccine is not recommended for adults with HIV infection. Preventive Services / Frequency Ages 46 to 49 years  Blood pressure check.** / Every 1 to 2 years.  Lipid and cholesterol check.** / Every 5 years beginning at age 77.  Clinical breast exam.** / Every 3 years for women in their 36s and 39s.  BRCA-related cancer risk assessment.** / For women who have family members with a BRCA-related cancer (breast, ovarian, tubal, or peritoneal cancers).  Pap test.** / Every 2 years from ages 54 through 19. Every 3 years starting at age 34 through age 78 or 25 with a history of 3 consecutive normal Pap tests.  HPV screening.** / Every 3 years from ages 35 through ages 72 to 46 with a history of 3 consecutive normal Pap tests.  Hepatitis C blood test.** / For any individual with known risks for hepatitis C.  Skin self-exam. / Monthly.  Influenza vaccine. / Every year.  Tetanus, diphtheria, and acellular pertussis (Tdap, Td) vaccine.** / Consult your health care provider. Pregnant women should receive 1 dose of Tdap vaccine during each pregnancy. 1 dose of Td every 10 years.  Varicella vaccine.** / Consult your health care provider. Pregnant females who do not have evidence of immunity should  receive the first dose after pregnancy.  HPV vaccine. / 3 doses over 6 months, if 40 and younger. The vaccine is not recommended for use in pregnant females. However, pregnancy testing is not needed before receiving a dose.  Measles, mumps, rubella (MMR) vaccine.** / You need at least 1 dose of MMR if you were born in 1957 or later. You may also need a 2nd dose. For females of childbearing age, rubella immunity should be determined. If there is no evidence of immunity, females who are not pregnant should be vaccinated. If there is no evidence of immunity, females who are pregnant should delay immunization until  after pregnancy.  Pneumococcal 13-valent conjugate (PCV13) vaccine.** / Consult your health care provider.  Pneumococcal polysaccharide (PPSV23) vaccine.** / 1 to 2 doses if you smoke cigarettes or if you have certain conditions.  Meningococcal vaccine.** / 1 dose if you are age 42 to 5 years and a Market researcher living in a residence hall, or have one of several medical conditions, you need to get vaccinated against meningococcal disease. You may also need additional booster doses.  Hepatitis A vaccine.** / Consult your health care provider.  Hepatitis B vaccine.** / Consult your health care provider.  Haemophilus influenzae type b (Hib) vaccine.** / Consult your health care provider. Ages 67 to 45 years  Blood pressure check.** / Every 1 to 2 years.  Lipid and cholesterol check.** / Every 5 years beginning at age 1 years.  Lung cancer screening. / Every year if you are aged 27-80 years and have a 30-pack-year history of smoking and currently smoke or have quit within the past 15 years. Yearly screening is stopped once you have quit smoking for at least 15 years or develop a health problem that would prevent you from having lung cancer treatment.  Clinical breast exam.** / Every year after age 9 years.  BRCA-related cancer risk assessment.** / For women who have family members with a BRCA-related cancer (breast, ovarian, tubal, or peritoneal cancers).  Mammogram.** / Every year beginning at age 65 years and continuing for as long as you are in good health. Consult with your health care provider.  Pap test.** / Every 3 years starting at age 43 years through age 74 or 47 years with a history of 3 consecutive normal Pap tests.  HPV screening.** / Every 3 years from ages 28 years through ages 10 to 84 years with a history of 3 consecutive normal Pap tests.  Fecal occult blood test (FOBT) of stool. / Every year beginning at age 3 years and continuing until age 31 years.  You may not need to do this test if you get a colonoscopy every 10 years.  Flexible sigmoidoscopy or colonoscopy.** / Every 5 years for a flexible sigmoidoscopy or every 10 years for a colonoscopy beginning at age 27 years and continuing until age 32 years.  Hepatitis C blood test.** / For all people born from [birth year] through 1965 and any individual with known risks for hepatitis C.  Skin self-exam. / Monthly.  Influenza vaccine. / Every year.  Tetanus, diphtheria, and acellular pertussis (Tdap/Td) vaccine.** / Consult your health care provider. Pregnant women should receive 1 dose of Tdap vaccine during each pregnancy. 1 dose of Td every 10 years.  Varicella vaccine.** / Consult your health care provider. Pregnant females who do not have evidence of immunity should receive the first dose after pregnancy.  Zoster vaccine.** / 1 dose for adults aged 33 years or older.  Measles, mumps, rubella (MMR) vaccine.** / You need at least 1 dose of MMR if you were born in 1957 or later. You may also need a 2nd dose. For females of childbearing age, rubella immunity should be determined. If there is no evidence of immunity, females who are not pregnant should be vaccinated. If there is no evidence of immunity, females who are pregnant should delay immunization until after pregnancy.  Pneumococcal 13-valent conjugate (PCV13) vaccine.** / Consult your health care provider.  Pneumococcal polysaccharide (PPSV23) vaccine.** / 1 to 2 doses if you smoke cigarettes or if you have certain conditions.  Meningococcal vaccine.** / Consult your health care provider.  Hepatitis A vaccine.** / Consult your health care provider.  Hepatitis B vaccine.** / Consult your health care provider.  Haemophilus influenzae type b (Hib) vaccine.** / Consult your health care provider. Ages 66 years and over  Blood pressure check.** / Every 1 to 2 years.  Lipid and cholesterol check.** / Every 5 years beginning at age 75  years.  Lung cancer screening. / Every year if you are aged 24-80 years and have a 30-pack-year history of smoking and currently smoke or have quit within the past 15 years. Yearly screening is stopped once you have quit smoking for at least 15 years or develop a health problem that would prevent you from having lung cancer treatment.  Clinical breast exam.** / Every year after age 48 years.  BRCA-related cancer risk assessment.** / For women who have family members with a BRCA-related cancer (breast, ovarian, tubal, or peritoneal cancers).  Mammogram.** / Every year beginning at age 73 years and continuing for as long as you are in good health. Consult with your health care provider.  Pap test.** / Every 3 years starting at age 67 years through age 94 or 63 years with 3 consecutive normal Pap tests. Testing can be stopped between 65 and 70 years with 3 consecutive normal Pap tests and no abnormal Pap or HPV tests in the past 10 years.  HPV screening.** / Every 3 years from ages 59 years through ages 55 or 24 years with a history of 3 consecutive normal Pap tests. Testing can be stopped between 65 and 70 years with 3 consecutive normal Pap tests and no abnormal Pap or HPV tests in the past 10 years.  Fecal occult blood test (FOBT) of stool. / Every year beginning at age 58 years and continuing until age 36 years. You may not need to do this test if you get a colonoscopy every 10 years.  Flexible sigmoidoscopy or colonoscopy.** / Every 5 years for a flexible sigmoidoscopy or every 10 years for a colonoscopy beginning at age 74 years and continuing until age 80 years.  Hepatitis C blood test.** / For all people born from [birth year] through 1965 and any individual with known risks for hepatitis C.  Osteoporosis screening.** / A one-time screening for women ages 73 years and over and women at risk for fractures or osteoporosis.  Skin self-exam. / Monthly.  Influenza vaccine. / Every year.  Tetanus,  diphtheria, and acellular pertussis (Tdap/Td) vaccine.** / 1 dose of Td every 10 years.  Varicella vaccine.** / Consult your health care provider.  Zoster vaccine.** / 1 dose for adults aged 75 years or older.  Pneumococcal 13-valent conjugate (PCV13) vaccine.** / Consult your health care provider.  Pneumococcal polysaccharide (PPSV23) vaccine.** / 1 dose for all adults aged 48 years and older.  Meningococcal vaccine.** / Consult your health care provider.  Hepatitis A vaccine.** / Consult your health care provider.  Hepatitis B vaccine.** / Consult your health care provider.  Haemophilus influenzae type b (Hib) vaccine.** / Consult your health care provider. ** Family history and personal history of risk and conditions may change your health care provider's recommendations. Document Released: 09/25/2001 Document Revised: 12/14/2013 Document Reviewed: 12/25/2010 [facility] Patient Information 2015 [place], [place]. This information is not intended to replace advice given to you by your health care provider. Make sure you discuss any questions you have with your health care provider.

## 2014-11-04 DIAGNOSIS — Z23 Encounter for immunization: Secondary | ICD-10-CM

## 2014-11-04 NOTE — Addendum Note (Signed)
Addended by: Marylin Crosby on: 11/04/2014 12:08 PM   Modules accepted: Orders

## 2014-11-09 ENCOUNTER — Telehealth: Payer: Self-pay

## 2014-11-09 NOTE — Telephone Encounter (Signed)
Patient wants to hold off on screening mammogram due to having a pelvic fracture

## 2014-11-11 ENCOUNTER — Other Ambulatory Visit: Payer: Self-pay | Admitting: Family Medicine

## 2014-11-12 NOTE — Telephone Encounter (Signed)
Last seen 09/20/14 B [place]

## 2015-02-07 ENCOUNTER — Encounter: Payer: Self-pay | Admitting: Physician Assistant

## 2015-02-07 ENCOUNTER — Ambulatory Visit (INDEPENDENT_AMBULATORY_CARE_PROVIDER_SITE_OTHER): Payer: Medicare Other | Admitting: Physician Assistant

## 2015-02-07 VITALS — BP 168/73 | HR 53 | Temp 97.1°F | Ht 64.0 in | Wt 256.0 lb

## 2015-02-07 DIAGNOSIS — R059 Cough, unspecified: Secondary | ICD-10-CM

## 2015-02-07 DIAGNOSIS — H6981 Other specified disorders of Eustachian tube, right ear: Secondary | ICD-10-CM | POA: Diagnosis not present

## 2015-02-07 DIAGNOSIS — R05 Cough: Secondary | ICD-10-CM | POA: Diagnosis not present

## 2015-02-07 MED ORDER — AMOXICILLIN 875 MG PO TABS: 875.0000 mg | ORAL_TABLET | Freq: Two times a day (BID) | ORAL | Status: DC

## 2015-02-07 NOTE — Progress Notes (Signed)
   Subjective:    Patient ID: Valerie Baker, female    DOB: 02/05/1956, 59 y.o.   MRN: [id number]  [facility] y/o female presents with c/o blister under tongue x 2 weeks and right ear pain. She has a h/o lichen planus of the mouth. She has used  Fluuocinonide Gel, the medicine that she normally used for lichen planus in her mouth, which has not helped. Increased hoarseness.     Review of Systems  Constitutional: Negative.   HENT: Positive for ear pain (right ) and mouth sores (blister under tongue ). Negative for congestion and sore throat.   Respiratory: Positive for cough.   Cardiovascular: Negative.   Gastrointestinal: Negative.   All other systems reviewed and are negative.      Objective:   Physical Exam  Constitutional: She appears well-developed and well-nourished. No distress.  HENT:  Nose: Nose normal.  Mouth/Throat: No oropharyngeal exudate.  ttp on area of right eustachian tube  Blister under tongue on right side   Skin: She is not diaphoretic.          Assessment & Plan:  1. Eustachian tube dysfunction, right - Otc plain musinex  - amoxicillin (AMOXIL) 875 MG tablet; Take 1 tablet (875 mg total) by mouth 2 (two) times daily.  Dispense: 20 tablet; Refill: 0  2. Cough - Musinex as directed  - inhaler every 6 hours    Continue all meds Labs pending Health Maintenance reviewed Diet and exercise encouraged RTO 2 weeks   Tiffany A. Benjamin Stain PA-C

## 2015-02-07 NOTE — Patient Instructions (Signed)
Take over the counter plain musinex as directed - drink plenty of fluids Use inhaler every 6 hours for cough   Return to clinic in 2 weeks for recheck

## 2015-02-21 ENCOUNTER — Ambulatory Visit (INDEPENDENT_AMBULATORY_CARE_PROVIDER_SITE_OTHER): Payer: Medicare Other | Admitting: Physician Assistant

## 2015-02-21 ENCOUNTER — Encounter: Payer: Self-pay | Admitting: Physician Assistant

## 2015-02-21 VITALS — BP 135/73 | HR 72 | Temp 97.6°F | Ht 64.0 in | Wt 254.0 lb

## 2015-02-21 DIAGNOSIS — L565 Disseminated superficial actinic porokeratosis (DSAP): Secondary | ICD-10-CM | POA: Diagnosis not present

## 2015-02-21 DIAGNOSIS — L439 Lichen planus, unspecified: Secondary | ICD-10-CM | POA: Diagnosis not present

## 2015-02-21 DIAGNOSIS — B3731 Acute candidiasis of vulva and vagina: Secondary | ICD-10-CM

## 2015-02-21 DIAGNOSIS — B373 Candidiasis of vulva and vagina: Secondary | ICD-10-CM | POA: Diagnosis not present

## 2015-02-21 DIAGNOSIS — L570 Actinic keratosis: Secondary | ICD-10-CM

## 2015-02-21 DIAGNOSIS — D485 Neoplasm of uncertain behavior of skin: Secondary | ICD-10-CM | POA: Diagnosis not present

## 2015-02-21 DIAGNOSIS — L449 Papulosquamous disorder, unspecified: Secondary | ICD-10-CM | POA: Diagnosis not present

## 2015-02-21 DIAGNOSIS — H9201 Otalgia, right ear: Secondary | ICD-10-CM | POA: Diagnosis not present

## 2015-02-21 MED ORDER — FLUCONAZOLE 150 MG PO TABS: 150.0000 mg | ORAL_TABLET | Freq: Once | ORAL | Status: DC

## 2015-02-21 MED ORDER — MAGIC MOUTHWASH W/LIDOCAINE: 5.0000 mL | Freq: Three times a day (TID) | ORAL | Status: DC | PRN

## 2015-02-21 NOTE — Progress Notes (Addendum)
   Subjective:    Patient ID: Valerie Baker, female    DOB: [birth year], 59 y.o.   MRN: [id number]  HPI 59 y/o female presents for follow up of right ear pain. She states that it is improving after treatment with amoxicillin. She also has h/o lichen planus in her mouth. She states that her sister has magic mouthwash and this seems to work well.   She also has scaling, red spots on her bilateral lower legs x 1+ years. Asyptomatic regarding pain and itch.     Review of Systems  Constitutional: Negative.   HENT: Positive for mouth sores (h/o lichen planus in mouth ). Negative for ear pain.   Skin: Positive for rash. Pallor: BLE.       Objective:   Physical Exam  Constitutional: She is oriented to person, place, and time. She appears well-developed and well-nourished. No distress.  HENT:  Right Ear: External ear normal.  Left Ear: External ear normal.  Mouth/Throat: Oropharynx is clear and moist. No oropharyngeal exudate.  Neurological: She is alert and oriented to person, place, and time.  Skin: Rash (distal BLE, erythematous scaling lesions resembling disseminated superficial actinic porokeratosis) noted. She is not diaphoretic.  Psychiatric: She has a normal mood and affect. Her behavior is normal. Thought content normal.  Nursing note and vitals reviewed.         Assessment & Plan:  1. Neoplasm of uncertain behavior of skin <.66mm shave biopsy of lesion on right leg to r/o DSAP  - Pathology  2. Lichen Planus of mouth - Magic mouthwash TID prn   3. Vulvovaginal Candidiasis - Diflucan 150mg  once.   Actinic keratosis - lesions of precancerous ak's were treated on BLE x 7   Continue all meds Labs pending Health Maintenance reviewed Diet and exercise encouraged RTO 1 month   Tiffany A. Benjamin Stain PA-C

## 2015-02-24 ENCOUNTER — Other Ambulatory Visit: Payer: Self-pay | Admitting: Physician Assistant

## 2015-02-24 DIAGNOSIS — L439 Lichen planus, unspecified: Secondary | ICD-10-CM

## 2015-02-24 LAB — PATHOLOGY

## 2015-02-24 MED ORDER — TRIAMCINOLONE ACETONIDE 0.1 % EX CREA: 1.0000 "application " | TOPICAL_CREAM | Freq: Two times a day (BID) | CUTANEOUS | Status: DC

## 2015-03-16 ENCOUNTER — Other Ambulatory Visit: Payer: Self-pay | Admitting: Nurse Practitioner

## 2015-03-16 NOTE — Telephone Encounter (Signed)
Last seen 02/21/15  Tiffany

## 2015-03-28 ENCOUNTER — Ambulatory Visit: Payer: Medicare Other | Admitting: Physician Assistant

## 2015-04-15 ENCOUNTER — Other Ambulatory Visit: Payer: Self-pay | Admitting: Pharmacist

## 2015-04-19 DIAGNOSIS — G894 Chronic pain syndrome: Secondary | ICD-10-CM | POA: Diagnosis not present

## 2015-04-19 DIAGNOSIS — M5416 Radiculopathy, lumbar region: Secondary | ICD-10-CM | POA: Diagnosis not present

## 2015-04-21 ENCOUNTER — Encounter: Payer: Self-pay | Admitting: Family Medicine

## 2015-04-21 ENCOUNTER — Ambulatory Visit (INDEPENDENT_AMBULATORY_CARE_PROVIDER_SITE_OTHER): Payer: Medicare Other | Admitting: Family Medicine

## 2015-04-21 VITALS — BP 147/82 | HR 90 | Temp 97.3°F | Ht 64.0 in | Wt 257.8 lb

## 2015-04-21 DIAGNOSIS — R109 Unspecified abdominal pain: Secondary | ICD-10-CM | POA: Diagnosis not present

## 2015-04-21 DIAGNOSIS — R350 Frequency of micturition: Secondary | ICD-10-CM | POA: Insufficient documentation

## 2015-04-21 LAB — POCT UA - MICROSCOPIC ONLY
CASTS, UR, LPF, POC: NEGATIVE
CRYSTALS, UR, HPF, POC: NEGATIVE
MUCUS UA: NEGATIVE
Yeast, UA: NEGATIVE

## 2015-04-21 LAB — POCT URINALYSIS DIPSTICK
BILIRUBIN UA: NEGATIVE
Glucose, UA: NEGATIVE
Leukocytes, UA: NEGATIVE
Nitrite, UA: NEGATIVE
Protein, UA: NEGATIVE
RBC UA: NEGATIVE
SPEC GRAV UA: 1.025
UROBILINOGEN UA: NEGATIVE
pH, UA: 6.5

## 2015-04-21 NOTE — Progress Notes (Signed)
   HPI  Patient presents today for concern of UTI  Patient explains that she's had lower abdominal pain, and urinary frequency for the last 1 day. She denies any dysuria, new back pain, or foul-smelling urine  She also denies fever, chills, sweats.  She has a history of bladder spasms treated with atropine and, if this helps some.  She also gets yeast infections very easily and asked for Diflucan if she ends up needing an antibiotic.  PMH: Smoking status noted ROS: Per HPI  Objective: BP 147/82 mmHg  Pulse 90  Temp(Src) 97.3 F (36.3 C) (Oral)  Ht 5\' 4"  (1.626 m)  Wt 257 lb 12.8 oz (116.937 kg)  BMI 44.23 kg/m2 Gen: NAD, alert, cooperative with exam HEENT: NCAT CV: RRR, good S1/S2, no murmur Resp: CTABL, no wheezes, non-labored Abd: Soft, mild tenderness to palpation of right lower quadrant, no suprapubic tenderness Ext: No edema, warm Neuro: Alert and oriented, No gross deficits  Assessment and plan:  # Urinary frequency, right lower quadrant tenderness Unlikely to be UTI given her urinalysis today, send for culture She does have ketones present, this is possibly a sign of dehydration, encourage fluids Discussed reasons for return or to seek medical care which she states that she understands. Right lower quadrant pain without any red flags for serious etiology, possibly constipation versus gas pain. Discussed with patient at length reasons to be concerned or to seek immediate medical care   Orders Placed This Encounter  Procedures  . Urine culture  . POCT UA - Microscopic Only  . POCT urinalysis dipstick    Meds ordered this encounter  Medications  . PREDNISONE, PAK, PO    Sig: Take by mouth.    Laroy Apple, MD [facility] 04/21/2015, 2:54 PM

## 2015-04-21 NOTE — Patient Instructions (Signed)
Great to meet you!  We will call with your results  SEEK MEDICAL CARE IF:   You have back pain (new back pain above usual back pain)  You develop a fever.  Your symptoms do not begin to resolve within 3 days. SEEK IMMEDIATE MEDICAL CARE IF:   You have severe back pain or lower abdominal pain.  You develop chills.  You have nausea or vomiting.  You have continued burning or discomfort with urination.

## 2015-04-23 LAB — URINE CULTURE

## 2015-05-16 ENCOUNTER — Other Ambulatory Visit: Payer: Self-pay | Admitting: Physician Assistant

## 2015-05-31 ENCOUNTER — Ambulatory Visit: Payer: Medicare Other | Admitting: Family Medicine

## 2015-06-01 ENCOUNTER — Encounter: Payer: Self-pay | Admitting: Family Medicine

## 2015-06-01 ENCOUNTER — Ambulatory Visit (INDEPENDENT_AMBULATORY_CARE_PROVIDER_SITE_OTHER): Payer: Medicare Other

## 2015-06-01 ENCOUNTER — Ambulatory Visit (INDEPENDENT_AMBULATORY_CARE_PROVIDER_SITE_OTHER): Payer: Medicare Other | Admitting: Family Medicine

## 2015-06-01 VITALS — BP 122/72 | HR 81 | Temp 98.2°F | Ht 64.0 in | Wt 256.6 lb

## 2015-06-01 DIAGNOSIS — Z23 Encounter for immunization: Secondary | ICD-10-CM

## 2015-06-01 DIAGNOSIS — M25532 Pain in left wrist: Secondary | ICD-10-CM | POA: Diagnosis not present

## 2015-06-01 MED ORDER — MELOXICAM 15 MG PO TABS: 15.0000 mg | ORAL_TABLET | Freq: Every day | ORAL | Status: DC

## 2015-06-01 NOTE — Progress Notes (Signed)
   Subjective:    Patient ID: Valerie Baker, female    DOB: 1956/03/21, 59 y.o.   MRN: [id number]  HPI 59 year old female with left wrist pain it's been present now for several weeks. There were been no falls but it seemed to start after she was doing some canning and turning a large pot/container to poor into smaller container. There is been no swelling or inflammation. She has a long history of disc disease from cervical to lumbar and sacral. There is no neck pain or pain above her elbow but primarily in the lateral aspect of her hand and in the forearm.    Review of Systems  HENT: Negative.   Respiratory: Negative.   Cardiovascular: Negative.   Musculoskeletal: Positive for arthralgias.  Psychiatric/Behavioral: Negative.        Objective:   Physical Exam  Constitutional: She appears well-developed and well-nourished.  Musculoskeletal:  Left wrist: There is tenderness to palpation along the lateral aspect and also over the fourth and fifth metacarpals. Pain increases with plantar and dorsiflexion against resistance and with pronation and supination.  X-ray shows no acute abnormality but the carpal bones appear abnormally aligned.          Assessment & Plan:  1. Pain in joint, forearm, left Suspect this pain is related to soft tissue injury in the ligaments or tendons. It probably came from overuse and/or trauma is consistent with tendinitis. I would like to put her in her wrist and forearm splint to wear 24 7 except removed today. I've given her an Rx for meloxicam 15 mg once a day for 2 weeks. - DG Wrist Complete Left; Future  Wardell Honour MD  2. Encounter for immunization  flu shot given

## 2015-06-20 ENCOUNTER — Other Ambulatory Visit: Payer: Self-pay | Admitting: *Deleted

## 2015-06-20 NOTE — Telephone Encounter (Signed)
Patient of Wendi Snipes. Has been seen recently but has not had this gel filled on 2 years. Please advise on refill

## 2015-06-20 NOTE — Telephone Encounter (Signed)
Patient is not sure what this is for and doesn't remember requesting it from the pharmacy.

## 2015-06-20 NOTE — Telephone Encounter (Signed)
Please contact the patient to clarify for what purpose they are using it.

## 2015-07-04 ENCOUNTER — Telehealth: Payer: Self-pay | Admitting: Family Medicine

## 2015-07-04 DIAGNOSIS — M549 Dorsalgia, unspecified: Secondary | ICD-10-CM | POA: Diagnosis not present

## 2015-07-04 DIAGNOSIS — M25551 Pain in right hip: Secondary | ICD-10-CM | POA: Diagnosis not present

## 2015-07-04 MED ORDER — FLUOCINONIDE 0.05 % EX GEL: Freq: Two times a day (BID) | CUTANEOUS | Status: DC

## 2015-07-04 NOTE — Telephone Encounter (Signed)
That's understandable, will ask for records for future refills.   Laroy Apple, MD [facility] 07/04/2015, 2:01 PM

## 2015-07-04 NOTE — Telephone Encounter (Signed)
Stp and advised rx was sent over and to try and get her ENT records faxed over. Pt voiced understanding.

## 2015-07-04 NOTE — Telephone Encounter (Signed)
Not sure of directions, please order if pt may have

## 2015-07-04 NOTE — Addendum Note (Signed)
Addended by: Timmothy Euler on: 07/04/2015 02:02 PM   Modules accepted: Orders

## 2015-07-04 NOTE — Telephone Encounter (Signed)
I am declining lidex because I dont typically use it on the oral mucosa.   Could use magic mouthwash, I will ask nursing to offer.  Will need to be seen if not effective

## 2015-07-04 NOTE — Telephone Encounter (Signed)
Stp and she states the ENT told her to use this in her mouth as it is the only thing that works for her and we have been giving this to her. Please advise?

## 2015-07-15 ENCOUNTER — Other Ambulatory Visit: Payer: Self-pay | Admitting: Physician Assistant

## 2015-08-03 ENCOUNTER — Ambulatory Visit (INDEPENDENT_AMBULATORY_CARE_PROVIDER_SITE_OTHER): Payer: Medicare Other | Admitting: Family Medicine

## 2015-08-03 ENCOUNTER — Encounter: Payer: Self-pay | Admitting: Family Medicine

## 2015-08-03 VITALS — BP 133/62 | HR 67 | Temp 97.7°F | Ht 64.0 in | Wt 258.0 lb

## 2015-08-03 DIAGNOSIS — M8580 Other specified disorders of bone density and structure, unspecified site: Secondary | ICD-10-CM

## 2015-08-03 DIAGNOSIS — I10 Essential (primary) hypertension: Secondary | ICD-10-CM | POA: Diagnosis not present

## 2015-08-03 DIAGNOSIS — M5137 Other intervertebral disc degeneration, lumbosacral region: Secondary | ICD-10-CM

## 2015-08-03 MED ORDER — FLUOCINONIDE 0.05 % EX GEL: Freq: Two times a day (BID) | CUTANEOUS | Status: DC

## 2015-08-03 MED ORDER — OXYCODONE-ACETAMINOPHEN 5-325 MG PO TABS: 1.0000 | ORAL_TABLET | ORAL | Status: DC | PRN

## 2015-08-03 NOTE — Progress Notes (Signed)
   Subjective:    Patient ID: Valerie Baker, female    DOB: 01/05/1956, 59 y.o.   MRN: [id number]  HPI 59 year old female who comes today to discuss her drugs. She has had 6 or 7 back surgeries due to disc disease and scoliosis. She tells me she is fused almost her entire spine. She has been getting oxycodone from the back surgeon but that is a higher co-pay and not is convenient being in [place] and she would like to start getting that medicine here. We discussed use of oxycodone. I think she certainly has need for the medicine giving her failed back surgeries and I do not think she abuses the medicine. She brings in a bottle of pills today. She got oxycodone 5 mg on September 21 of 120 and still has about 20 left in the bottle. We spent some time discussing future protocol still has to be finalized and she is willing to comply with protocol. She also requests a refill on fluocinonide gel that she uses for lichen planus in her mouth. Other medicines include gabapentin 900 mg 3 times a day and raloxifene for her bones.    Review of Systems  HENT: Negative.   Respiratory: Negative.   Cardiovascular: Negative.   Gastrointestinal: Negative.   Musculoskeletal: Positive for back pain.  Psychiatric/Behavioral: Negative.       BP 133/62 mmHg  Pulse 67  Temp(Src) 97.7 F (36.5 C) (Oral)  Ht 5\' 4"  (1.626 m)  Wt 258 lb (117.028 kg)  BMI 44.26 kg/m2   Objective:   Physical Exam  Constitutional: She is oriented to person, place, and time. She appears well-developed and well-nourished.  Cardiovascular: Regular rhythm and normal heart sounds.   Pulmonary/Chest: Effort normal and breath sounds normal.  Neurological: She is alert and oriented to person, place, and time.  Psychiatric: She has a normal mood and affect. Her behavior is normal.          Assessment & Plan:  1. Essential hypertension Blood pressure is well managed on current regimen. Continue same  2. Morbid obesity,  unspecified obesity type [facility]) Patient is aware of diagnosis and this is ongoing problem as it is with most people with obesity  3. Disc degeneration, lumbosacral Filled oxycodone and pain contract signed  4. Osteopenia A new calcium and vitamin D. Not sure why she has to take raloxifene but we'll sort this out. At her upcoming physical  Wardell Honour MD

## 2015-08-18 ENCOUNTER — Other Ambulatory Visit: Payer: Self-pay | Admitting: *Deleted

## 2015-08-18 DIAGNOSIS — K449 Diaphragmatic hernia without obstruction or gangrene: Secondary | ICD-10-CM

## 2015-08-18 MED ORDER — DEXLANSOPRAZOLE 60 MG PO CPDR: 60.0000 mg | DELAYED_RELEASE_CAPSULE | Freq: Every day | ORAL | Status: DC

## 2015-08-22 ENCOUNTER — Ambulatory Visit (INDEPENDENT_AMBULATORY_CARE_PROVIDER_SITE_OTHER): Payer: PPO | Admitting: Family Medicine

## 2015-08-22 ENCOUNTER — Encounter: Payer: Self-pay | Admitting: Family Medicine

## 2015-08-22 VITALS — BP 125/71 | HR 66 | Temp 97.4°F | Ht 64.0 in | Wt 258.4 lb

## 2015-08-22 DIAGNOSIS — M5137 Other intervertebral disc degeneration, lumbosacral region: Secondary | ICD-10-CM

## 2015-08-22 DIAGNOSIS — I10 Essential (primary) hypertension: Secondary | ICD-10-CM

## 2015-08-22 MED ORDER — OMEPRAZOLE 40 MG PO CPDR: 40.0000 mg | DELAYED_RELEASE_CAPSULE | Freq: Two times a day (BID) | ORAL | Status: DC

## 2015-08-22 MED ORDER — GABAPENTIN 400 MG PO CAPS: 800.0000 mg | ORAL_CAPSULE | Freq: Three times a day (TID) | ORAL | Status: DC

## 2015-08-22 NOTE — Progress Notes (Signed)
Subjective:    Patient ID: Candis Musa, female    DOB: 1956/01/05, 60 y.o.   MRN: [id number]  HPI 60 year old female who is here for an annual physical. Her medical problems include obesity, hypertension, overactive bladder, lumbar and cervical disc disease and asthma. She seems to be doing well. Her back pain is controlled with oxycodone and meloxicam. She does not get Pap smears anymore since she is status post total hysterectomy. She expressed an interest in setting up mammogram. She has been screened with the colonoscopy and is followed by GI practice in [place] for that.  Patient Active Problem List   Diagnosis Date Noted  . Urinary frequency 04/21/2015  . Stress fracture of pelvis 11/03/2014  . Disc degeneration, lumbosacral 11/03/2014  . Osteopenia 11/03/2014  . Severe obesity (BMI >= 40) ([place]) 11/03/2014  . Essential hypertension 04/04/2009  . ASTHMA 04/04/2009  . MIGRAINES, HX OF 04/04/2009   Outpatient Encounter Prescriptions as of 08/22/2015  Medication Sig  . Alum & Mag Hydroxide-Simeth (MAGIC MOUTHWASH W/LIDOCAINE) SOLN Take 5 mLs by mouth 3 (three) times daily as needed for mouth pain.  Marland Kitchen amLODipine (NORVASC) 10 MG tablet Take 1 tablet (10 mg total) by mouth daily. (Patient taking differently: Take 5 mg by mouth daily. )  . baclofen (LIORESAL) 20 MG tablet Take 20 mg by mouth 3 (three) times daily as needed for muscle spasms.  . Calcium Carbonate-Vitamin D (CALCIUM 600+D) 600-400 MG-UNIT per tablet Take 1 tablet by mouth 2 (two) times daily.    . Cholecalciferol (VITAMIN D3) 2000 UNITS TABS Take 1 tablet by mouth daily.    . citalopram (CELEXA) 20 MG tablet Take 1 tablet (20 mg total) by mouth daily.  . fluocinonide gel (LIDEX) 0.05 % Apply topically 2 (two) times daily.  Marland Kitchen gabapentin (NEURONTIN) 300 MG capsule Take 600 mg by mouth 3 (three) times daily.   Marland Kitchen loratadine (CLARITIN) 10 MG tablet Take 10 mg by mouth daily as needed for allergies.  . meloxicam (MOBIC)  15 MG tablet Take 1 tablet (15 mg total) by mouth daily.  . Multiple Vitamin (MULTIVITAMIN) tablet Take 1 tablet by mouth daily.   Marland Kitchen oxybutynin (DITROPAN) 5 MG tablet Take 1 tablet (5 mg total) by mouth 2 (two) times daily.  Marland Kitchen oxyCODONE-acetaminophen (PERCOCET/ROXICET) 5-325 MG tablet Take 1 tablet by mouth every 4 (four) hours as needed for severe pain.  . raloxifene (EVISTA) 60 MG tablet TAKE ONE (1) TABLET EACH DAY  . traZODone (DESYREL) 100 MG tablet TAKE ONE TABLET DAILY AT BEDTIME  . VENTOLIN HFA 108 (90 BASE) MCG/ACT inhaler USE 2 PUFFS EVERY 6 HOURS AS NEEDED  . [DISCONTINUED] dexlansoprazole (DEXILANT) 60 MG capsule Take 1 capsule (60 mg total) by mouth daily.   No facility-administered encounter medications on file as of 08/22/2015.      Review of Systems  Constitutional: Negative.   Respiratory: Negative.   Cardiovascular: Negative.   Neurological: Negative.   Psychiatric/Behavioral: Negative.        Objective:   Physical Exam  Constitutional: She is oriented to person, place, and time. She appears well-developed and well-nourished.  Eyes: Pupils are equal, round, and reactive to light.  Neck: Normal range of motion.  Cardiovascular: Normal rate, regular rhythm and intact distal pulses.   Pulmonary/Chest: Effort normal and breath sounds normal.  Abdominal: Soft.  Neurological: She is alert and oriented to person, place, and time.  Psychiatric: She has a normal mood and affect. Her behavior is normal.  Assessment & Plan:  1. Essential hypertension Blood pressures are well controlled on current regimen of amlodipine. Continue same - CMP14+EGFR; Future - Lipid panel; Future - CMP14+EGFR - Lipid panel  2. Disc degeneration, lumbosacral Patient has a history of osteopenia and should be on calcium and vitamin D which she is I'm not sure understand why she is also on Evista area pain is controlled on current regimen of oxycodone - CMP14+EGFR; Future - Lipid  panel; Future - CMP14+EGFR - Lipid panel  Wardell Honour MD

## 2015-08-23 LAB — LIPID PANEL
CHOL/HDL RATIO: 3.5 ratio (ref 0.0–4.4)
Cholesterol, Total: 214 mg/dL — ABNORMAL HIGH (ref 100–199)
HDL: 61 mg/dL (ref >39)
LDL Calculated: 117 mg/dL — ABNORMAL HIGH (ref 0–99)
Triglycerides: 180 mg/dL — ABNORMAL HIGH (ref 0–149)
VLDL Cholesterol Cal: 36 mg/dL (ref 5–40)

## 2015-08-23 LAB — CMP14+EGFR
A/G RATIO: 1.2 (ref 1.1–2.5)
ALT: 19 IU/L (ref 0–32)
AST: 22 IU/L (ref 0–40)
Albumin: 3.6 g/dL (ref 3.5–5.5)
Alkaline Phosphatase: 128 IU/L — ABNORMAL HIGH (ref 39–117)
BUN / CREAT RATIO: 23 (ref 9–23)
BUN: 17 mg/dL (ref 6–24)
Bilirubin Total: 0.2 mg/dL (ref 0.0–1.2)
CALCIUM: 9.3 mg/dL (ref 8.7–10.2)
CO2: 26 mmol/L (ref 18–29)
Chloride: 100 mmol/L (ref 96–106)
Creatinine, Ser: 0.74 mg/dL (ref 0.57–1.00)
GFR, EST AFRICAN AMERICAN: 103 mL/min/{1.73_m2} (ref >59)
GFR, EST NON AFRICAN AMERICAN: 89 mL/min/{1.73_m2} (ref >59)
GLOBULIN, TOTAL: 2.9 g/dL (ref 1.5–4.5)
Glucose: 85 mg/dL (ref 65–99)
POTASSIUM: 4.4 mmol/L (ref 3.5–5.2)
SODIUM: 139 mmol/L (ref 134–144)
TOTAL PROTEIN: 6.5 g/dL (ref 6.0–8.5)

## 2015-08-26 ENCOUNTER — Other Ambulatory Visit: Payer: Self-pay | Admitting: Family Medicine

## 2015-08-26 DIAGNOSIS — R350 Frequency of micturition: Secondary | ICD-10-CM

## 2015-08-26 DIAGNOSIS — F32A Depression, unspecified: Secondary | ICD-10-CM

## 2015-08-26 DIAGNOSIS — F329 Major depressive disorder, single episode, unspecified: Secondary | ICD-10-CM

## 2015-08-26 DIAGNOSIS — I10 Essential (primary) hypertension: Secondary | ICD-10-CM

## 2015-08-29 ENCOUNTER — Other Ambulatory Visit: Payer: Self-pay | Admitting: Family Medicine

## 2015-08-29 MED ORDER — OXYBUTYNIN CHLORIDE 5 MG PO TABS: 5.0000 mg | ORAL_TABLET | Freq: Two times a day (BID) | ORAL | Status: DC

## 2015-08-29 MED ORDER — MELOXICAM 15 MG PO TABS: 15.0000 mg | ORAL_TABLET | Freq: Every day | ORAL | Status: DC

## 2015-08-29 MED ORDER — OMEPRAZOLE 40 MG PO CPDR: 40.0000 mg | DELAYED_RELEASE_CAPSULE | Freq: Two times a day (BID) | ORAL | Status: DC

## 2015-08-29 MED ORDER — AMLODIPINE BESYLATE 5 MG PO TABS: 5.0000 mg | ORAL_TABLET | Freq: Every day | ORAL | Status: DC

## 2015-08-29 MED ORDER — GABAPENTIN 400 MG PO CAPS: 800.0000 mg | ORAL_CAPSULE | Freq: Three times a day (TID) | ORAL | Status: DC

## 2015-08-29 MED ORDER — CITALOPRAM HYDROBROMIDE 20 MG PO TABS: 20.0000 mg | ORAL_TABLET | Freq: Every day | ORAL | Status: DC

## 2015-08-29 MED ORDER — TRAZODONE HCL 100 MG PO TABS: 100.0000 mg | ORAL_TABLET | Freq: Every day | ORAL | Status: DC

## 2015-08-29 MED ORDER — BACLOFEN 20 MG PO TABS: 20.0000 mg | ORAL_TABLET | Freq: Three times a day (TID) | ORAL | Status: DC | PRN

## 2015-08-29 NOTE — Telephone Encounter (Signed)
Done, pt aware.

## 2015-08-29 NOTE — Telephone Encounter (Signed)
done

## 2015-09-02 ENCOUNTER — Telehealth: Payer: Self-pay | Admitting: Family Medicine

## 2015-09-07 ENCOUNTER — Telehealth: Payer: Self-pay | Admitting: Family Medicine

## 2015-09-07 DIAGNOSIS — M9905 Segmental and somatic dysfunction of pelvic region: Secondary | ICD-10-CM | POA: Diagnosis not present

## 2015-09-07 DIAGNOSIS — M9904 Segmental and somatic dysfunction of sacral region: Secondary | ICD-10-CM | POA: Diagnosis not present

## 2015-09-07 DIAGNOSIS — M9901 Segmental and somatic dysfunction of cervical region: Secondary | ICD-10-CM | POA: Diagnosis not present

## 2015-09-07 DIAGNOSIS — M9902 Segmental and somatic dysfunction of thoracic region: Secondary | ICD-10-CM | POA: Diagnosis not present

## 2015-09-07 DIAGNOSIS — I973 Postprocedural hypertension: Secondary | ICD-10-CM | POA: Diagnosis not present

## 2015-09-07 DIAGNOSIS — M9903 Segmental and somatic dysfunction of lumbar region: Secondary | ICD-10-CM | POA: Diagnosis not present

## 2015-09-07 DIAGNOSIS — M5030 Other cervical disc degeneration, unspecified cervical region: Secondary | ICD-10-CM | POA: Diagnosis not present

## 2015-09-07 NOTE — Telephone Encounter (Signed)
Patient states that she was suppose to be on amlodipine 10 instead of 5mg . She also states that they need a reason why we changed dexilant to omeprazole and why dose of gabapentin was changed.

## 2015-09-12 NOTE — Telephone Encounter (Signed)
This encounter will be closed please see other telephone encounter.

## 2015-09-13 NOTE — Telephone Encounter (Signed)
Patients omeprazole and gabapentin have been taken care of. She states that her amlodipine is suppose to be 10mg  not 5mg . Please advise

## 2015-09-15 ENCOUNTER — Telehealth: Payer: Self-pay | Admitting: Nurse Practitioner

## 2015-09-15 DIAGNOSIS — I10 Essential (primary) hypertension: Secondary | ICD-10-CM

## 2015-09-15 NOTE — Telephone Encounter (Signed)
As long as she doesn't have edema with 10 mg of amlodipine it will be okay to refill the 10 mg dose

## 2015-09-16 MED ORDER — AMLODIPINE BESYLATE 10 MG PO TABS: 10.0000 mg | ORAL_TABLET | Freq: Every day | ORAL | Status: DC

## 2015-09-16 NOTE — Telephone Encounter (Signed)
Patient aware rx sent to pharmacy.  

## 2015-09-16 NOTE — Telephone Encounter (Signed)
Done, she takes 10 mg not 5 mg

## 2015-09-26 DIAGNOSIS — M25552 Pain in left hip: Secondary | ICD-10-CM | POA: Diagnosis not present

## 2015-09-28 ENCOUNTER — Ambulatory Visit: Payer: PPO | Attending: Rehabilitation | Admitting: Physical Therapy

## 2015-09-28 DIAGNOSIS — M546 Pain in thoracic spine: Secondary | ICD-10-CM | POA: Insufficient documentation

## 2015-09-28 DIAGNOSIS — M542 Cervicalgia: Secondary | ICD-10-CM | POA: Insufficient documentation

## 2015-09-28 DIAGNOSIS — R5381 Other malaise: Secondary | ICD-10-CM | POA: Diagnosis not present

## 2015-09-28 NOTE — Therapy (Signed)
[facility] [place], [place], [address] Phone: [phone]   Fax:  [phone]  Physical Therapy Evaluation  Patient Details  Name: GENOVA MARCHBANK MRN: [id number] Date of Birth: 01-16-1956 Referring Provider: Zonia Kief MD.  Encounter Date: 09/28/2015      PT End of Session - 09/28/15 1142    Visit Number 1   Number of Visits 12   Date for PT Re-Evaluation 11/16/15   PT Start Time 1030   PT Stop Time 1122   PT Time Calculation (min) 52 min   Activity Tolerance Patient tolerated treatment well   Behavior During Therapy [facility] for tasks assessed/performed      Past Medical History  Diagnosis Date  . Hypertension   . GERD (gastroesophageal reflux disease)   . Asthma   . Migraines   . Anxiety and depression   . Arthritis   . Adenomatous colon polyp 06/2006  . Hyperlipidemia   . Fibromyalgia   . IBS (irritable bowel syndrome)   . Kidney stones   . Obesity   . Allergy     SINUS  . Anxiety   . Blood transfusion   . Depression   . Osteopenia   . Cataract   . Bladder incontinence     Past Surgical History  Procedure Laterality Date  . Back surgery  2000  . Total knee arthroplasty  [id number]  . Abdominal hysterectomy    . Appendectomy    . Nissen fundoplication  [id number]  . Nasal sinus surgery  2003  . Debridement tennis elbow    . Lumbar fusion  2007  . Spinal cord stimulator implant  2005  . Lumbar fusion  2002,2003,2007,2008  . Rotator cuff repair  [id number]  . Foot surgery  1994    right  . Elbow surgery  2003  . Eye surgery      There were no vitals filed for this visit.  Visit Diagnosis:  Neck pain - Plan: PT plan of care cert/re-cert  Bilateral thoracic back pain - Plan: PT plan of care cert/re-cert  Debility - Plan: PT plan of care cert/re-cert      Subjective Assessment - 09/28/15 1040    Subjective I hurt worse now than I did before surgery.   Limitations Standing   How long can  you stand comfortably? Less than 10 minutes.   Patient Stated Goals Get out of pain.   Currently in Pain? Yes   Pain Score 8    Pain Location Neck   Pain Orientation Left   Pain Descriptors / Indicators --  Continuous hurting   Pain Onset More than a month ago   Pain Frequency Constant   Aggravating Factors  Prolonged standing; sitting; using hands a lot.  Perform ADL's.  Certain neck movements.   Pain Relieving Factors Not move.            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   Referring Provider Zonia Kief MD.   Onset Date/Surgical Date --  Ongoing over many years.   Hand Dominance Right   Precautions   Precautions None   Restrictions   Weight Bearing Restrictions No   Balance Screen   Has the patient fallen in the past 6 months No   Has the patient had a decrease in activity level because of a fear of falling?  No   Is the patient reluctant to leave their  home because of a fear of falling?  No   Home Environment   Living Environment Private residence   Prior Function   Level of Independence Independent   Posture/Postural Control   Posture/Postural Control Postural limitations   Postural Limitations Rounded Shoulders;Forward head   ROM / Strength   AROM / PROM / Strength AROM;Strength   AROM   Overall AROM Comments Left active cervical rotation= 55 and bilateral SB= 15 degrees.   Strength   Overall Strength Comments Normal bilateral UE strength.   Palpation   Palpation comment Tender diffusely over bilateral suboccipital musculature, bilateral UT's (left > right) and bilateral upper thoracic musculature.   Ambulation/Gait   Gait Comments --  WNL's.                   [facility] Adult PT Treatment/Exercise - 2015-10-18 0001    Modalities   Modalities Electrical Stimulation   Electrical Stimulation   Electrical Stimulation Location Bilateral UT/upper thoracic musculature.    Electrical Stimulation Action IFC   Electrical Stimulation Parameters 80-150 HZ at 100% scan x 15 minutes.   Electrical Stimulation Goals Pain                  PT Short Term Goals - 10/18/15 1139    PT SHORT TERM GOAL #1   Title Ind with a HEP.   Time 2   Period Weeks   Status New           PT Long Term Goals - 2015/10/18 1140    PT LONG TERM GOAL #1   Title Increase active cervical rotation to 65 degrees+ so patient can turn head more easily while driving.   Time 6   Period Weeks   Status New   PT LONG TERM GOAL #2   Title Sit 30 minutes with pain not > 4/10.   Time 6   Period Weeks   Status New   PT LONG TERM GOAL #3   Title Stand 20 minutes with pain not > 4/10.   Time 6   Period Weeks   Status New   PT LONG TERM GOAL #4   Title Perform ADL's with pain not > 4/10.               Plan - Oct 18, 2015 1134    Clinical Impression Statement The patient has had ongoing neck pain over many years and had a fusion in 2014.  She states her pain is high today rated at an 8/10.  She reports her pain has been near a 10/10 with prolonged sitting, standing , perforing ADL's with certain neck movements and use of UE's.  She experiences referral of pain into bilateral shoulders.   Pt will benefit from skilled therapeutic intervention in order to improve on the following deficits Pain;Decreased activity tolerance;Decreased range of motion   Rehab Potential Good   PT Frequency 2x / week   PT Duration 6 weeks   PT Treatment/Interventions Electrical Stimulation;Moist Heat;Therapeutic exercise;Therapeutic activities;Ultrasound;Manual techniques   PT Next Visit Plan IFC; U/S and STW/M to bilateral UT's and thoracic musculature.  Gentle active cervical range of motion exercises.  NO traction of any kind per MD.   Consulted and Agree with Plan of Care Patient          G-Codes - 2015-10-18 1133    Functional Assessment Tool Used Clinical judgement.   Functional Limitation  Mobility: Walking and moving around   Mobility: Walking and Moving Around Current Status [phone])  At least 60 percent but less than 80 percent impaired, limited or restricted   Mobility: Walking and Moving Around Goal Status [phone]) At least 20 percent but less than 40 percent impaired, limited or restricted       Problem List Patient Active Problem List   Diagnosis Date Noted  . Urinary frequency 04/21/2015  . Stress fracture of pelvis 11/03/2014  . Disc degeneration, lumbosacral 11/03/2014  . Osteopenia 11/03/2014  . Severe obesity (BMI >= 40) ([place]) 11/03/2014  . Essential hypertension 04/04/2009  . ASTHMA 04/04/2009  . MIGRAINES, HX OF 04/04/2009    APPLEGATE, [place] MPT 09/28/2015, 11:44 AM  [facility] [address] [place], [place], [address] Phone: [phone]   Fax:  [phone]  Name: DAILANI FEHNEL MRN: [id number] Date of Birth: February 28, 1956

## 2015-10-03 ENCOUNTER — Ambulatory Visit: Payer: PPO | Admitting: Physical Therapy

## 2015-10-03 ENCOUNTER — Encounter: Payer: Self-pay | Admitting: Physical Therapy

## 2015-10-03 DIAGNOSIS — M542 Cervicalgia: Secondary | ICD-10-CM | POA: Diagnosis not present

## 2015-10-03 DIAGNOSIS — M546 Pain in thoracic spine: Secondary | ICD-10-CM

## 2015-10-03 DIAGNOSIS — R5381 Other malaise: Secondary | ICD-10-CM

## 2015-10-03 NOTE — Therapy (Signed)
[facility] [place], [place], [address] Phone: [phone]   Fax:  [phone]  Physical Therapy Treatment  Patient Details  Name: Valerie Baker MRN: [id number] Date of Birth: 1955-12-20 Referring Provider: Zonia Kief MD.  Encounter Date: 10/03/2015      PT End of Session - 10/03/15 0948    Visit Number 2   Number of Visits 12   Date for PT Re-Evaluation 11/16/15   PT Start Time 0948   PT Stop Time 1034   PT Time Calculation (min) 46 min   Activity Tolerance Patient tolerated treatment well   Behavior During Therapy [facility] for tasks assessed/performed      Past Medical History  Diagnosis Date  . Hypertension   . GERD (gastroesophageal reflux disease)   . Asthma   . Migraines   . Anxiety and depression   . Arthritis   . Adenomatous colon polyp 06/2006  . Hyperlipidemia   . Fibromyalgia   . IBS (irritable bowel syndrome)   . Kidney stones   . Obesity   . Allergy     SINUS  . Anxiety   . Blood transfusion   . Depression   . Osteopenia   . Cataract   . Bladder incontinence     Past Surgical History  Procedure Laterality Date  . Back surgery  2000  . Total knee arthroplasty  [id number]  . Abdominal hysterectomy    . Appendectomy    . Nissen fundoplication  [id number]  . Nasal sinus surgery  2003  . Debridement tennis elbow    . Lumbar fusion  2007  . Spinal cord stimulator implant  2005  . Lumbar fusion  2002,2003,2007,2008  . Rotator cuff repair  [id number]  . Foot surgery  1994    right  . Elbow surgery  2003  . Eye surgery      There were no vitals filed for this visit.  Visit Diagnosis:  Neck pain  Bilateral thoracic back pain  Debility      Subjective Assessment - 10/03/15 0947    Subjective "It hurts worse now than it did before surgery." Still having pain along B thoracic and neck region. No pain at rest just with activity as high as 8-9/10 pain per patient.   Limitations Standing    How long can you stand comfortably? Less than 10 minutes.   Patient Stated Goals Get out of pain.   Currently in Pain? 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   Hand Dominance Right   Precautions   Precautions None                     [facility] Adult PT Treatment/Exercise - 10/03/15 0001    Modalities   Modalities Electrical Stimulation;Moist Heat;Ultrasound   Moist Heat Therapy   Number Minutes Moist Heat 15 Minutes   Moist Heat Location Lumbar Spine   Electrical Stimulation   Electrical Stimulation Location Bilateral UT/upper thoracic musculature.   Electrical Stimulation Action IFC   Electrical Stimulation Parameters 80-150 Hz x15 min   Electrical Stimulation Goals Pain   Ultrasound   Ultrasound Location B UT/ thoracic musculature   Ultrasound Parameters 1.5 /wcm2, 100%,1 mhz x10 min   Ultrasound Goals Pain   Manual Therapy   Manual Therapy Myofascial release   Myofascial Release MFR to B UT/  thoracic musculature in sitting to decrease pain and tightness                  PT Short Term Goals - 09/28/15 1139    PT SHORT TERM GOAL #1   Title Ind with a HEP.   Time 2   Period Weeks   Status New           PT Long Term Goals - 09/28/15 1140    PT LONG TERM GOAL #1   Title Increase active cervical rotation to 65 degrees+ so patient can turn head more easily while driving.   Time 6   Period Weeks   Status New   PT LONG TERM GOAL #2   Title Sit 30 minutes with pain not > 4/10.   Time 6   Period Weeks   Status New   PT LONG TERM GOAL #3   Title Stand 20 minutes with pain not > 4/10.   Time 6   Period Weeks   Status New   PT LONG TERM GOAL #4   Title Perform ADL's with pain not > 4/10.               Plan - 10/03/15 1025    Clinical Impression Statement Patient tolerated today's treatment well with only reports of senstivity and  soreness during manual therapy. Sensitivty noted around inferior angle of B scapula as well as along spine of R scapula per patient report. Patient presented with increased tightness in B UT region especially the L side. Normal modalities response noted following removal of the modalities. Patient experienced upper back feeling "better" following today's treatment.   Pt will benefit from skilled therapeutic intervention in order to improve on the following deficits Pain;Decreased activity tolerance;Decreased range of motion   Rehab Potential Good   PT Frequency 2x / week   PT Duration 6 weeks   PT Treatment/Interventions Electrical Stimulation;Moist Heat;Therapeutic exercise;Therapeutic activities;Ultrasound;Manual techniques   PT Next Visit Plan IFC; U/S and STW/M to bilateral UT's and thoracic musculature.  Gentle active cervical range of motion exercises.  NO traction of any kind per MD.   Consulted and Agree with Plan of Care Patient        Problem List Patient Active Problem List   Diagnosis Date Noted  . Urinary frequency 04/21/2015  . Stress fracture of pelvis 11/03/2014  . Disc degeneration, lumbosacral 11/03/2014  . Osteopenia 11/03/2014  . Severe obesity (BMI >= 40) ([place]) 11/03/2014  . Essential hypertension 04/04/2009  . ASTHMA 04/04/2009  . MIGRAINES, HX OF 04/04/2009    Wynelle Fanny, PTA 10/03/2015, 10:52 AM  [facility] [address] [place], [place], [address] Phone: [phone]   Fax:  [phone]  Name: Valerie Baker MRN: [id number] Date of Birth: 03-28-1956

## 2015-10-07 ENCOUNTER — Encounter: Payer: Self-pay | Admitting: Physical Therapy

## 2015-10-07 ENCOUNTER — Ambulatory Visit: Payer: PPO | Admitting: Physical Therapy

## 2015-10-07 DIAGNOSIS — M546 Pain in thoracic spine: Secondary | ICD-10-CM

## 2015-10-07 DIAGNOSIS — M542 Cervicalgia: Secondary | ICD-10-CM

## 2015-10-07 DIAGNOSIS — R5381 Other malaise: Secondary | ICD-10-CM

## 2015-10-07 NOTE — Therapy (Signed)
[facility] [place], [place], [address] Phone: [phone]   Fax:  [phone]  Physical Therapy Treatment  Patient Details  Name: Valerie Baker MRN: [id number] Date of Birth: [birth year] Referring Provider: Zonia Kief MD.  Encounter Date: 10/07/2015      PT End of Session - 10/07/15 0950    Visit Number 3   Number of Visits 12   Date for PT Re-Evaluation 11/16/15   PT Start Time 0950   PT Stop Time 1035   PT Time Calculation (min) 45 min   Activity Tolerance Patient tolerated treatment well   Behavior During Therapy [facility] for tasks assessed/performed      Past Medical History  Diagnosis Date  . Hypertension   . GERD (gastroesophageal reflux disease)   . Asthma   . Migraines   . Anxiety and depression   . Arthritis   . Adenomatous colon polyp 06/2006  . Hyperlipidemia   . Fibromyalgia   . IBS (irritable bowel syndrome)   . Kidney stones   . Obesity   . Allergy     SINUS  . Anxiety   . Blood transfusion   . Depression   . Osteopenia   . Cataract   . Bladder incontinence     Past Surgical History  Procedure Laterality Date  . Back surgery  2000  . Total knee arthroplasty  [id number]  . Abdominal hysterectomy    . Appendectomy    . Nissen fundoplication  [id number]  . Nasal sinus surgery  2003  . Debridement tennis elbow    . Lumbar fusion  2007  . Spinal cord stimulator implant  2005  . Lumbar fusion  2002,2003,2007,2008  . Rotator cuff repair  [id number]  . Foot surgery  1994    right  . Elbow surgery  2003  . Eye surgery      There were no vitals filed for this visit.  Visit Diagnosis:  Neck pain  Bilateral thoracic back pain  Debility      Subjective Assessment - 10/07/15 0949    Subjective States that she is hurting all along thoracic region and LBP too. Has had a hectic week this week.   Limitations Standing   How long can you stand comfortably? Less than 10 minutes.   Patient  Stated Goals Get out of pain.            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   Hand Dominance Right                     [facility] Adult PT Treatment/Exercise - 10/07/15 0001    Modalities   Modalities Electrical Stimulation;Moist Heat;Ultrasound   Moist Heat Therapy   Number Minutes Moist Heat 15 Minutes   Moist Heat Location Lumbar Spine   Electrical Stimulation   Electrical Stimulation Location Bilateral UT/upper thoracic musculature.   Electrical Stimulation Action IFC   Electrical Stimulation Parameters 80-150 Hz x15 min   Electrical Stimulation Goals Pain   Ultrasound   Ultrasound Location B UT/ thoracic paraspinals   Ultrasound Parameters 1.5 w/cm2, 100%,1 mhz x10 min   Ultrasound Goals Pain   Manual Therapy   Manual Therapy Myofascial release   Myofascial Release MFR to B UT/ thoracic musculature in sitting to decrease pain and tightness  PT Short Term Goals - 09/28/15 1139    PT SHORT TERM GOAL #1   Title Ind with a HEP.   Time 2   Period Weeks   Status New           PT Long Term Goals - 09/28/15 1140    PT LONG TERM GOAL #1   Title Increase active cervical rotation to 65 degrees+ so patient can turn head more easily while driving.   Time 6   Period Weeks   Status New   PT LONG TERM GOAL #2   Title Sit 30 minutes with pain not > 4/10.   Time 6   Period Weeks   Status New   PT LONG TERM GOAL #3   Title Stand 20 minutes with pain not > 4/10.   Time 6   Period Weeks   Status New   PT LONG TERM GOAL #4   Title Perform ADL's with pain not > 4/10.               Plan - 10/07/15 1024    Clinical Impression Statement Patient tolerated today's treatment well with only two instances of verbalizing sensation during manual therapy. Patient experienced "feeling it" around L UT as it ascends into cervical spine and also at inferior  angle of R scapula patient noted that she could "feel it" although that is the region she states she has decreased sensation. Tightness noted in B UT and along B thoracic paraspinals region today during manual therapy. Normal modalities response noted following removal of the modalities.   Pt will benefit from skilled therapeutic intervention in order to improve on the following deficits Pain;Decreased activity tolerance;Decreased range of motion   Rehab Potential Good   PT Frequency 2x / week   PT Duration 6 weeks   PT Treatment/Interventions Electrical Stimulation;Moist Heat;Therapeutic exercise;Therapeutic activities;Ultrasound;Manual techniques   PT Next Visit Plan Continue with manual therapy and modalities as needed. Consider giving HEP next treatment for thoraic and cervical strengthening.   Consulted and Agree with Plan of Care Patient        Problem List Patient Active Problem List   Diagnosis Date Noted  . Urinary frequency 04/21/2015  . Stress fracture of pelvis 11/03/2014  . Disc degeneration, lumbosacral 11/03/2014  . Osteopenia 11/03/2014  . Severe obesity (BMI >= 40) ([place]) 11/03/2014  . Essential hypertension 04/04/2009  . ASTHMA 04/04/2009  . MIGRAINES, HX OF 04/04/2009    Wynelle Fanny, PTA 10/07/2015, 10:53 AM  [facility] [address] [place], [place], [address] Phone: [phone]   Fax:  [phone]  Name: Valerie Baker MRN: [id number] Date of Birth: 04-Jun-1956

## 2015-10-10 ENCOUNTER — Ambulatory Visit: Payer: PPO | Admitting: Physical Therapy

## 2015-10-11 ENCOUNTER — Encounter: Payer: PPO | Admitting: Physical Therapy

## 2015-10-14 ENCOUNTER — Encounter: Payer: PPO | Admitting: Physical Therapy

## 2015-10-19 ENCOUNTER — Encounter: Payer: Self-pay | Admitting: Physical Therapy

## 2015-10-19 ENCOUNTER — Ambulatory Visit: Payer: PPO | Attending: Rehabilitation | Admitting: Physical Therapy

## 2015-10-19 DIAGNOSIS — M542 Cervicalgia: Secondary | ICD-10-CM | POA: Insufficient documentation

## 2015-10-19 DIAGNOSIS — R5381 Other malaise: Secondary | ICD-10-CM | POA: Insufficient documentation

## 2015-10-19 DIAGNOSIS — M546 Pain in thoracic spine: Secondary | ICD-10-CM | POA: Diagnosis not present

## 2015-10-19 NOTE — Patient Instructions (Signed)
AROM: Neck Extension    Push the back of your head straight back then look upwards. Hold for 3 seconds. Repeat _10___ times per set. Do _1-2___ sets per session. Do _2-3___ sessions per day.  http://orth.exer.us/300   Copyright  VHI. All rights reserved.  Strengthening: Shoulder Shrug (Phase 1)    Shrug shoulders up and down, forward and backward. Repeat _10___ times per set. Do __2-3__ sets per session. Do _2-3___ sessions per day.  http://orth.exer.us/336   Copyright  VHI. All rights reserved.  Scapular Retraction: Abduction (Standing)    With arms elevated and elbows bent to 90, pinch shoulder blades together and press arms back. Repeat _10___ times per set. Do _2-3___ sets per session. Do _2-3___ sessions per day.  http://orth.exer.us/950   Copyright  VHI. All rights reserved.

## 2015-10-19 NOTE — Therapy (Signed)
[facility] [place], [place], [address] Phone: [phone]   Fax:  [phone]  Physical Therapy Treatment  Patient Details  Name: Valerie Baker MRN: [id number] Date of Birth: 1956-08-05 Referring Provider: Zonia Kief MD.  Encounter Date: 10/19/2015      PT End of Session - 10/19/15 1310    Visit Number 4   Number of Visits 12   Date for PT Re-Evaluation 11/16/15   PT Start Time [id number]   PT Stop Time 1353  2 units secondary to patient request for short duration STW   PT Time Calculation (min) 48 min   Activity Tolerance Patient tolerated treatment well   Behavior During Therapy [facility] for tasks assessed/performed      Past Medical History  Diagnosis Date  . Hypertension   . GERD (gastroesophageal reflux disease)   . Asthma   . Migraines   . Anxiety and depression   . Arthritis   . Adenomatous colon polyp 06/2006  . Hyperlipidemia   . Fibromyalgia   . IBS (irritable bowel syndrome)   . Kidney stones   . Obesity   . Allergy     SINUS  . Anxiety   . Blood transfusion   . Depression   . Osteopenia   . Cataract   . Bladder incontinence     Past Surgical History  Procedure Laterality Date  . Back surgery  2000  . Total knee arthroplasty  [id number]  . Abdominal hysterectomy    . Appendectomy    . Nissen fundoplication  [id number]  . Nasal sinus surgery  2003  . Debridement tennis elbow    . Lumbar fusion  2007  . Spinal cord stimulator implant  2005  . Lumbar fusion  2002,2003,2007,2008  . Rotator cuff repair  [id number]  . Foot surgery  1994    right  . Elbow surgery  2003  . Eye surgery      There were no vitals filed for this visit.  Visit Diagnosis:  Neck pain  Bilateral thoracic back pain  Debility      Subjective Assessment - 10/19/15 1308    Subjective Reports that last week she had increased pain and especially in shoulder blades region. Patient requests stim and heat first and then  ice. Has been under a lot of stress recently as well.   Limitations Standing   How long can you stand comfortably? Less than 10 minutes.   Patient Stated Goals Get out of pain.   Currently in Pain? 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   Hand Dominance Right                     [facility] Adult PT Treatment/Exercise - 10/19/15 0001    Modalities   Modalities Electrical Stimulation;Moist Heat;Ultrasound;Cryotherapy   Moist Heat Therapy   Number Minutes Moist Heat 15 Minutes   Moist Heat Location Lumbar Spine   Cryotherapy   Number Minutes Cryotherapy 10 Minutes   Cryotherapy Location Other (comment)  Upper Back   Type of Cryotherapy Ice pack   Electrical Stimulation   Electrical Stimulation Location Bilateral UT/upper thoracic musculature.   Dealer  Stimulation Action IFC   Electrical Stimulation Parameters 80-150 Hz x15 min   Electrical Stimulation Goals Pain   Ultrasound   Ultrasound Location B UT/ thoracic musculature   Ultrasound Parameters 1.5 w/cm2, 100%,1 mhz x10 min   Ultrasound Goals Pain   Manual Therapy   Manual Therapy Soft tissue mobilization   Soft tissue mobilization Gentle STW to B UT, thoracic paraspinals, rhomboids to decrease tightness and pain in sitting                PT Education - 10/19/15 1343    Education provided Yes   Education Details HEP- cervical retraction, shoulder shrugs, shoulder retraction   Person(s) Educated Patient   Methods Explanation;Demonstration;Verbal cues;Handout   Comprehension Verbalized understanding;Returned demonstration;Verbal cues required          PT Short Term Goals - 09/28/15 1139    PT SHORT TERM GOAL #1   Title Ind with a HEP.   Time 2   Period Weeks    Status New           PT Long Term Goals - 09/28/15 1140    PT LONG TERM GOAL #1   Title Increase active cervical rotation to 65 degrees+ so patient can turn head more easily while driving.   Time 6   Period Weeks   Status New   PT LONG TERM GOAL #2   Title Sit 30 minutes with pain not > 4/10.   Time 6   Period Weeks   Status New   PT LONG TERM GOAL #3   Title Stand 20 minutes with pain not > 4/10.   Time 6   Period Weeks   Status New   PT LONG TERM GOAL #4   Title Perform ADL's with pain not > 4/10.               Plan - 10/19/15 1418    Clinical Impression Statement Patient presented in clinic today with reports of increased pain since previous treatment. Patient presented with increased inflammation noted in R thoracic musculature medial to R scapula and the same region was inflammed in the L musculature although R side was greater than L. Very gentle STW completed today secondary to patient request along with modalities secondary to pain. Normal modalities response noted with each modality although patient noted that ice pack may be more effective next treatment in supine. Acepted HEP approved by MPT requiring moderate multimodal cueing and demonstration for correct technique.    Pt will benefit from skilled therapeutic intervention in order to improve on the following deficits Pain;Decreased activity tolerance;Decreased range of motion   Rehab Potential Good   PT Frequency 2x / week   PT Duration 6 weeks   PT Treatment/Interventions Electrical Stimulation;Moist Heat;Therapeutic exercise;Therapeutic activities;Ultrasound;Manual techniques   PT Next Visit Plan Continue with manual therapy and modalities as needed.    PT Home Exercise Plan HEP- cervical retraction with extension. shoulder shrugs and retraction   Consulted and Agree with Plan of Care Patient        Problem List Patient Active Problem List   Diagnosis Date Noted  . Urinary frequency 04/21/2015  .  Stress fracture of pelvis 11/03/2014  . Disc degeneration, lumbosacral 11/03/2014  . Osteopenia 11/03/2014  . Severe obesity (BMI >= 40) ([place]) 11/03/2014  . Essential hypertension 04/04/2009  . ASTHMA 04/04/2009  . MIGRAINES, HX OF 04/04/2009    Wynelle Fanny, PTA 10/19/2015, 2:27 PM  [facility] [address]  [place], [place], [address] Phone: [phone]   Fax:  [phone]  Name: Valerie Baker MRN: [id number] Date of Birth: [birth year]

## 2015-10-24 ENCOUNTER — Ambulatory Visit: Payer: PPO | Admitting: Physical Therapy

## 2015-10-24 DIAGNOSIS — M542 Cervicalgia: Secondary | ICD-10-CM | POA: Diagnosis not present

## 2015-10-24 DIAGNOSIS — M546 Pain in thoracic spine: Secondary | ICD-10-CM

## 2015-10-24 DIAGNOSIS — R5381 Other malaise: Secondary | ICD-10-CM

## 2015-10-24 NOTE — Therapy (Signed)
[facility] [place], [place], [address] Phone: [phone]   Fax:  [phone]  Physical Therapy Treatment  Patient Details  Name: Valerie Baker MRN: [id number] Date of Birth: 16-Nov-1955 Referring Provider: Zonia Kief MD.  Encounter Date: 10/24/2015      PT End of Session - 10/24/15 0906    Visit Number 5   Number of Visits 12   Date for PT Re-Evaluation 11/16/15   PT Start Time 0905   PT Stop Time 1003   PT Time Calculation (min) 58 min   Activity Tolerance Patient tolerated treatment well   Behavior During Therapy [facility] for tasks assessed/performed      Past Medical History  Diagnosis Date  . Hypertension   . GERD (gastroesophageal reflux disease)   . Asthma   . Migraines   . Anxiety and depression   . Arthritis   . Adenomatous colon polyp 06/2006  . Hyperlipidemia   . Fibromyalgia   . IBS (irritable bowel syndrome)   . Kidney stones   . Obesity   . Allergy     SINUS  . Anxiety   . Blood transfusion   . Depression   . Osteopenia   . Cataract   . Bladder incontinence     Past Surgical History  Procedure Laterality Date  . Back surgery  2000  . Total knee arthroplasty  [id number]  . Abdominal hysterectomy    . Appendectomy    . Nissen fundoplication  [id number]  . Nasal sinus surgery  2003  . Debridement tennis elbow    . Lumbar fusion  2007  . Spinal cord stimulator implant  2005  . Lumbar fusion  2002,2003,2007,2008  . Rotator cuff repair  [id number]  . Foot surgery  1994    right  . Elbow surgery  2003  . Eye surgery      There were no vitals filed for this visit.  Visit Diagnosis:  Neck pain  Bilateral thoracic back pain  Debility      Subjective Assessment - 10/24/15 0906    Subjective Patient states that the left side of her neck is really hurting her them most.   Limitations Standing   How long can you stand comfortably? Less than 10 minutes.   Patient Stated Goals Get out  of pain.   Currently in Pain? Yes   Pain Score 3    Pain Location Neck   Pain Orientation Left   Pain Descriptors / Indicators Sharp   Pain Onset More than a month ago   Pain Frequency Constant   Aggravating Factors  washing dishes, walking   Pain Relieving Factors rest                         [facility] Adult PT Treatment/Exercise - 10/24/15 0001    Exercises   Exercises Neck   Modalities   Modalities Electrical Stimulation   Moist Heat Therapy   Number Minutes Moist Heat 15 Minutes   Moist Heat Location Cervical  and upper back   Electrical Stimulation   Electrical Stimulation Location Bilateral UT/upper thoracic musculature.   Electrical Stimulation Action IFC   Electrical Stimulation Parameters 80-150 Hz to tolerance   Electrical Stimulation Goals Pain   Ultrasound   Ultrasound Location L levator scap/UT and B thoracic   Ultrasound Parameters 1.5 wcm2 1 mhz cont x 10 min   Ultrasound Goals Pain   Manual Therapy   Manual Therapy Soft  tissue mobilization   Soft tissue mobilization Deep to B lev scap and UT as well as thoracic muscles distal to scapula   Neck Exercises: Stretches   Upper Trapezius Stretch 1 rep;20 seconds  Bil   Levator Stretch 1 rep;20 seconds  Bil   Other Neck Stretches attempted upper back stretches but none were effective in getting the tight area                PT Education - 10/24/15 1157    Education provided Yes   Education Details HEP   Person(s) Educated Patient   Methods Explanation;Demonstration;Handout   Comprehension Verbalized understanding;Returned demonstration;Verbal cues required          PT Short Term Goals - 10/24/15 1202    PT SHORT TERM GOAL #1   Title Ind with a HEP.   Time 2   Period Weeks   Status Achieved           PT Long Term Goals - 09/28/15 1140    PT LONG TERM GOAL #1   Title Increase active cervical rotation to 65 degrees+ so patient can turn head more easily while driving.    Time 6   Period Weeks   Status New   PT LONG TERM GOAL #2   Title Sit 30 minutes with pain not > 4/10.   Time 6   Period Weeks   Status New   PT LONG TERM GOAL #3   Title Stand 20 minutes with pain not > 4/10.   Time 6   Period Weeks   Status New   PT LONG TERM GOAL #4   Title Perform ADL's with pain not > 4/10.               Plan - 10/24/15 1159    Clinical Impression Statement Patient reports that L side of neck is hurting the most today. She continues to have pain in B UT, lev scap and thoracic region. She tolerated deep STW well today. New stretches were issued. LTGs are ongoing.   Pt will benefit from skilled therapeutic intervention in order to improve on the following deficits Pain;Decreased activity tolerance;Decreased range of motion   Rehab Potential Good   PT Frequency 2x / week   PT Duration 6 weeks   PT Treatment/Interventions Electrical Stimulation;Moist Heat;Therapeutic exercise;Therapeutic activities;Ultrasound;Manual techniques   PT Next Visit Plan Continue with manual therapy and modalities as needed.         Problem List Patient Active Problem List   Diagnosis Date Noted  . Urinary frequency 04/21/2015  . Stress fracture of pelvis 11/03/2014  . Disc degeneration, lumbosacral 11/03/2014  . Osteopenia 11/03/2014  . Severe obesity (BMI >= 40) ([place]) 11/03/2014  . Essential hypertension 04/04/2009  . ASTHMA 04/04/2009  . MIGRAINES, HX OF 04/04/2009    [name] PT  10/24/2015, 12:03 PM  [facility] [address] [place], [place], [address] Phone: [phone]   Fax:  [phone]  Name: Valerie Baker MRN: [id number] Date of Birth: May 18, 1956

## 2015-10-24 NOTE — Patient Instructions (Signed)
  Flexibility: Upper Trapezius Stretch   Gently grasp right side of head while reaching behind back with other hand. Tilt head away until a gentle stretch is felt. Hold 30 seconds. Repeat 3 times per set. Do 2 sessions per day.  http://orth.exer.us/340   Levator Stretch   Grasp seat or sit on hand on side to be stretched. Turn head toward other side and look down. Use hand on head to gently stretch neck in that position. Hold _30___ seconds. Repeat on other side. Repeat 3 times. Do 2 sessions per day.  http://[email or web address].exer.us/30   Scapular Retraction (Standing)   With arms at sides, pinch shoulder blades together. Repeat 10 times per set. Do 1-3 sets per session. Do 2 sessions per day.  http://orth.exer.us/944     Flexibility: Neck Retraction   Pull head straight back, keeping eyes and jaw level. Hold 3-5 seconds. Repeat _10 times per set. Do 3-5  sessions per day.  http://orth.exer.us/344   Posture - Sitting   Sit upright, head facing forward. Try using a roll to support lower back. Keep shoulders relaxed, and avoid rounded back. Keep hips level with knees. Avoid crossing legs for long periods.  [name], PT 10/24/2015 9:50 AM [facility] [address] [name], [place], [address] Phone: [phone]   Fax:  [phone]

## 2015-10-28 ENCOUNTER — Ambulatory Visit: Payer: PPO | Admitting: Physical Therapy

## 2015-10-28 ENCOUNTER — Encounter: Payer: Self-pay | Admitting: Physical Therapy

## 2015-10-28 DIAGNOSIS — M546 Pain in thoracic spine: Secondary | ICD-10-CM

## 2015-10-28 DIAGNOSIS — R5381 Other malaise: Secondary | ICD-10-CM

## 2015-10-28 DIAGNOSIS — M542 Cervicalgia: Secondary | ICD-10-CM | POA: Diagnosis not present

## 2015-10-28 NOTE — Therapy (Signed)
[facility] [place], [place], [address] Phone: [phone]   Fax:  [phone]  Physical Therapy Treatment  Patient Details  Name: Valerie Baker MRN: [id number] Date of Birth: [birth year] Referring Provider: Zonia Kief MD.  Encounter Date: 10/28/2015      PT End of Session - 10/28/15 0901    Visit Number 6   Number of Visits 12   Date for PT Re-Evaluation 11/16/15   PT Start Time 0901   PT Stop Time 0948   PT Time Calculation (min) 47 min   Activity Tolerance Patient tolerated treatment well   Behavior During Therapy [facility] for tasks assessed/performed      Past Medical History  Diagnosis Date  . Hypertension   . GERD (gastroesophageal reflux disease)   . Asthma   . Migraines   . Anxiety and depression   . Arthritis   . Adenomatous colon polyp 06/2006  . Hyperlipidemia   . Fibromyalgia   . IBS (irritable bowel syndrome)   . Kidney stones   . Obesity   . Allergy     SINUS  . Anxiety   . Blood transfusion   . Depression   . Osteopenia   . Cataract   . Bladder incontinence     Past Surgical History  Procedure Laterality Date  . Back surgery  2000  . Total knee arthroplasty  [id number]  . Abdominal hysterectomy    . Appendectomy    . Nissen fundoplication  [id number]  . Nasal sinus surgery  2003  . Debridement tennis elbow    . Lumbar fusion  2007  . Spinal cord stimulator implant  2005  . Lumbar fusion  2002,2003,2007,2008  . Rotator cuff repair  [id number]  . Foot surgery  1994    right  . Elbow surgery  2003  . Eye surgery      There were no vitals filed for this visit.  Visit Diagnosis:  Neck pain  Bilateral thoracic back pain  Debility      Subjective Assessment - 10/28/15 0900    Subjective Patient states that she was sore from previous treatment. States that she cooked supper last night and was busy yesterday and had increased pain last night. Patient states that she encounters  discomfort with cervical retraction exercise in HEP.   Limitations Standing   How long can you stand comfortably? Less than 10 minutes.   Patient Stated Goals Get out of pain.   Currently in Pain? 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   Hand Dominance Right   Precautions   Precautions None   Restrictions   Weight Bearing Restrictions No                     [facility] Adult PT Treatment/Exercise - 10/28/15 0001    Modalities   Modalities Electrical Stimulation;Moist Heat;Ultrasound   Moist Heat Therapy   Number Minutes Moist Heat 15 Minutes   Moist Heat Location Lumbar Spine;Cervical   Electrical Stimulation   Electrical Stimulation Location Bilateral UT/upper thoracic musculature.   Electrical Stimulation Action IFC   Electrical Stimulation Parameters 80-150 Hz x15 mn   Electrical  Stimulation Goals Pain;Tone   Ultrasound   Ultrasound Location B UT/ Thoracic musculature   Ultrasound Parameters 1.5 w/cm2, 100%,1 mhz x10 min   Ultrasound Goals Pain   Manual Therapy   Manual Therapy Soft tissue mobilization   Soft tissue mobilization STW/MFR to B UT/ Levator Scapula/ thoracic paraspinals to decrease tightness and pain                  PT Short Term Goals - 10/24/15 1202    PT SHORT TERM GOAL #1   Title Ind with a HEP.   Time 2   Period Weeks   Status Achieved           PT Long Term Goals - 09/28/15 1140    PT LONG TERM GOAL #1   Title Increase active cervical rotation to 65 degrees+ so patient can turn head more easily while driving.   Time 6   Period Weeks   Status New   PT LONG TERM GOAL #2   Title Sit 30 minutes with pain not > 4/10.   Time 6   Period Weeks   Status New   PT LONG TERM GOAL #3    Title Stand 20 minutes with pain not > 4/10.   Time 6   Period Weeks   Status New   PT LONG TERM GOAL #4   Title Perform ADL's with pain not > 4/10.               Plan - 10/28/15 0939    Clinical Impression Statement Patient arrrived to treatment after having increased pain last night due to a lot of actvity during the day. Tightness was noted in B UT and Levator Scapula region with L>R today upon palpation. Increased inflammation was noted in R mid thoracic paraspinals region today upon observation which also had incresaed tightness upon palpation as well. Patient educated that cervical retractions exercise may elicit pain secondary to increased tightness of the B UT and Levator Scapula. Normal modalties response noted following end of the modaliities. Goals remain on-going at this time secondary to pain experienced by patient at this time. Patient was educated that use of TENS unit at home could help with her pain as she stated she had a TENS unit at home. Patient verablized following today's treatment that her back felt "good."   Pt will benefit from skilled therapeutic intervention in order to improve on the following deficits Pain;Decreased activity tolerance;Decreased range of motion   Rehab Potential Good   PT Frequency 2x / week   PT Duration 6 weeks   PT Treatment/Interventions Electrical Stimulation;Moist Heat;Therapeutic exercise;Therapeutic activities;Ultrasound;Manual techniques   PT Next Visit Plan Continue with manual therapy and modalities as needed.    PT Home Exercise Plan HEP- cervical retraction with extension. shoulder shrugs and retraction   Consulted and Agree with Plan of Care Patient        Problem List Patient Active Problem List   Diagnosis Date Noted  . Urinary frequency 04/21/2015  . Stress fracture of pelvis 11/03/2014  . Disc degeneration, lumbosacral 11/03/2014  . Osteopenia 11/03/2014  . Severe obesity (BMI >= 40) ([place]) 11/03/2014  . Essential  hypertension 04/04/2009  . ASTHMA 04/04/2009  . MIGRAINES, HX OF 04/04/2009    Wynelle Fanny, PTA 10/28/2015, 9:54 AM  [facility] [address] [place], [place], [address] Phone: [phone]   Fax:  [phone]  Name: Valerie Baker MRN: [id number] Date of Birth: 1956/02/01

## 2015-11-01 ENCOUNTER — Ambulatory Visit: Payer: PPO | Admitting: Physical Therapy

## 2015-11-01 DIAGNOSIS — M546 Pain in thoracic spine: Secondary | ICD-10-CM

## 2015-11-01 DIAGNOSIS — R5381 Other malaise: Secondary | ICD-10-CM

## 2015-11-01 DIAGNOSIS — M542 Cervicalgia: Secondary | ICD-10-CM

## 2015-11-01 NOTE — Therapy (Addendum)
[facility] [place], [place], [address] Phone: [phone]   Fax:  [phone]  Physical Therapy Treatment  Patient Details  Name: Valerie Baker MRN: [id number] Date of Birth: 08-Jul-1956 Referring Provider: Zonia Kief MD.  Encounter Date: 11/01/2015      PT End of Session - 11/01/15 0817    Visit Number 7   Number of Visits 12   Date for PT Re-Evaluation 11/16/15   PT Start Time 0817   PT Stop Time 0914   PT Time Calculation (min) 57 min   Activity Tolerance Patient tolerated treatment well   Behavior During Therapy [facility] for tasks assessed/performed      Past Medical History  Diagnosis Date  . Hypertension   . GERD (gastroesophageal reflux disease)   . Asthma   . Migraines   . Anxiety and depression   . Arthritis   . Adenomatous colon polyp 06/2006  . Hyperlipidemia   . Fibromyalgia   . IBS (irritable bowel syndrome)   . Kidney stones   . Obesity   . Allergy     SINUS  . Anxiety   . Blood transfusion   . Depression   . Osteopenia   . Cataract   . Bladder incontinence     Past Surgical History  Procedure Laterality Date  . Back surgery  2000  . Total knee arthroplasty  [id number]  . Abdominal hysterectomy    . Appendectomy    . Nissen fundoplication  9924  . Nasal sinus surgery  2003  . Debridement tennis elbow    . Lumbar fusion  2007  . Spinal cord stimulator implant  2005  . Lumbar fusion  2002,2003,2007,2008  . Rotator cuff repair  [id number]  . Foot surgery  1994    right  . Elbow surgery  2003  . Eye surgery      There were no vitals filed for this visit.  Visit Diagnosis:  Neck pain  Bilateral thoracic back pain  Debility      Subjective Assessment - 11/01/15 0817    Subjective I feel like my neck is getting a little better, but I'm still having a lot of pain with standing and cooking or doing the dishes.   Limitations Standing   How long can you stand comfortably?  Less than 10 minutes.   Patient Stated Goals Get out of pain.   Currently in Pain? 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                     [facility] Adult PT Treatment/Exercise - 11/01/15 0001    Neck Exercises: Theraband   Shoulder Extension 20 reps;Red   Rows 20 reps;Red   Horizontal ABduction 20 reps;Red   Neck Exercises: Standing   Other Standing Exercises --   Other Standing Exercises --   Modalities   Modalities Electrical Stimulation;Moist Heat;Ultrasound   Moist Heat Therapy   Number Minutes Moist Heat 15 Minutes   Moist Heat Location Cervical;Lumbar Spine   Electrical Stimulation   Electrical Stimulation Location Bilateral UT/upper thoracic musculature.   Electrical Stimulation Action IFC   Electrical Stimulation Parameters 80-150 Hz to tolerance x 15   Electrical Stimulation  Goals Pain   Ultrasound   Ultrasound Location B UT/Thoracic musculature   Ultrasound Parameters 1.5 w/cm2 1 mhz cont x 10 min   Ultrasound Goals Pain   Manual Therapy   Manual Therapy Soft tissue mobilization   Soft tissue mobilization to B UT/lev scap, C-T paraspinals and lats                PT Education - 11/01/15 0918    Education provided Yes   Education Details HEP   Person(s) Educated Patient   Methods Explanation;Demonstration;Handout   Comprehension Verbalized understanding;Returned demonstration          PT Short Term Goals - 10/24/15 1202    PT SHORT TERM GOAL #1   Title Ind with a HEP.   Time 2   Period Weeks   Status Achieved           PT Long Term Goals - 11/01/15 0919    PT LONG TERM GOAL #1   Title Increase active cervical rotation to 65 degrees+ so patient can turn head more easily while driving.   Time 6   Period Weeks    Status On-going   PT LONG TERM GOAL #2   Title Sit 30 minutes with pain not > 4/10.   Time 6   Period Weeks   Status On-going   PT LONG TERM GOAL #3   Title Stand 20 minutes with pain not > 4/10.   Time 6   Period Weeks   Status On-going   PT LONG TERM GOAL #4   Title Perform ADL's with pain not > 4/10.   Time 6   Period Weeks   Status On-going               Plan - 11/01/15 2694    Clinical Impression Statement Patient reports some improvement, but continues to have pain with ADLS involving standing. Patient tolerated upper back strengthening today with no increase in pain. She demos decreased tension in B UT and was able to demo cervical ROM Bil 80% of normal with slight pain with end range on R. LTGs are ongoing.   Pt will benefit from skilled therapeutic intervention in order to improve on the following deficits Pain;Decreased activity tolerance;Decreased range of motion   Rehab Potential Good   PT Frequency 2x / week   PT Duration 6 weeks   PT Treatment/Interventions Electrical Stimulation;Moist Heat;Therapeutic exercise;Therapeutic activities;Ultrasound;Manual techniques   PT Next Visit Plan Assess response to scapular stab exercises. Continue with manual therapy and modalities as needed.    PT Home Exercise Plan HEP: Red tband horizontal ABD, rows and shoulder ext   Consulted and Agree with Plan of Care Patient        Problem List Patient Active Problem List   Diagnosis Date Noted  . Urinary frequency 04/21/2015  . Stress fracture of pelvis 11/03/2014  . Disc degeneration, lumbosacral 11/03/2014  . Osteopenia 11/03/2014  . Severe obesity (BMI >= 40) ([place]) 11/03/2014  . Essential hypertension 04/04/2009  . ASTHMA 04/04/2009  . MIGRAINES, HX OF 04/04/2009    Valerie Baker PT  11/01/2015, 9:30 AM  [facility] [place], [place], [address] Phone: [phone]   Fax:  [phone]  Name: Valerie Baker MRN: [id number] Date of Birth: [birth year]  PHYSICAL THERAPY DISCHARGE SUMMARY  Visits from Start of Care: 7.  Current functional level related to goals / functional outcomes: See above.   Remaining deficits: Continued neck pain and  loss of ROM.   Education / Equipment: HEP. Plan: Patient agrees to discharge.  Patient goals were not met. Patient is being discharged due to not returning since the last visit.  ?????         Mali Applegate MPT

## 2015-11-01 NOTE — Patient Instructions (Signed)
  Strengthening: Chest Pull - Resisted   With resistive band looped around each hand, and arms straight out in front, stretch band across chest. Repeat __10__ times per set. Do 1-3____ sets per session. Do _1___ sessions per day.  http://orth.exer.us/926   Copyright  VHI. All rights reserved.   Resistive Band Rowing   With resistive band anchored in door, grasp both ends. Keeping elbows bent, pull back, squeezing shoulder blades together. Hold _3-5___ seconds. Repeat _10-30___ times. Do ____ sessions per day. 1 http://gt2.exer.us/98   Copyright  VHI. All rights reserved.   Strengthening: Resisted Extension   Hold tubing with both hands, arms forward. Pull arms back, elbow straight. Repeat _10-30___ times per set. Do ____ sets per session. Do _1___ sessions per day.  http://orth.exer.us/833   Copyright  VHI. All rights reserved.  [name], PT 11/01/2015 9:02 AM [facility] [address] [place], [place], [address] Phone: [phone]   Fax:  [phone]

## 2015-11-02 ENCOUNTER — Encounter: Payer: PPO | Admitting: Physical Therapy

## 2015-11-07 ENCOUNTER — Encounter: Payer: PPO | Admitting: Physical Therapy

## 2015-11-19 ENCOUNTER — Ambulatory Visit (INDEPENDENT_AMBULATORY_CARE_PROVIDER_SITE_OTHER): Payer: PPO | Admitting: Family Medicine

## 2015-11-19 VITALS — BP 124/66 | HR 65 | Temp 97.3°F | Ht 64.0 in | Wt 265.0 lb

## 2015-11-19 DIAGNOSIS — M6289 Other specified disorders of muscle: Secondary | ICD-10-CM | POA: Diagnosis not present

## 2015-11-19 DIAGNOSIS — R29898 Other symptoms and signs involving the musculoskeletal system: Secondary | ICD-10-CM | POA: Diagnosis not present

## 2015-11-19 NOTE — Progress Notes (Signed)
   HPI  Patient presents today here with arm and leg weakness.  Scribed that over the last 2 months or so she has noticed bilateral hand weakness. It seems to be getting slightly worse. She explains that at times, nearly every day, she can be holding an object such as a cell phone or piece of paper and without meaning to drop it.  She also notices leg weakness bilaterally over the last 2 weeks, however she has no examples of how she perceives this. She has no problems walking, no problems driving a car, no problems doing her daily functions.  She has a history of chronic neck and back pain after several surgeries, her neck pain has been worse since her last surgery 3 years ago but has no recent worsening. She also denies any recent worsening of her back pain.  She denies fever, chills, sweats, focal weakness, numbness, or tingling. She has no double vision, headaches, or changes in speech or thinking.  She has had some slight frontal headache pain that located above her right eye that comes and goes and is sharp nature  PMH: Smoking status noted ROS: Per HPI  Objective: BP 124/66 mmHg  Pulse 65  Temp(Src) 97.3 F (36.3 C) (Oral)  Ht 5\' 4"  (1.626 m)  Wt 265 lb (120.203 kg)  BMI 45.46 kg/m2 Gen: NAD, alert, cooperative with exam HEENT: NCAT, EOMI, PERRLA CV: RRR, good S1/S2, no murmur Resp: CTABL, no wheezes, non-labored Ext: No edema, warm Neuro: Alert and oriented,  Strength 5/5 and sensation intact in all 4 extremities and symmetric, cranial nerves II through XII intact No pronator drift No tremor Normal gait   Assessment and plan:  # Hand and leg weakness Not appreciated on exam Unclear etiology Discussed very careful watchful waiting, we reviewed signs and symptoms of stroke and other reasons to seek emergency medical care such as sudden worsening or onset of  Pain/weakness/numbness, difficulty walking, or other concerns With her significant back and neck surgery  history I did explain and discuss extreme caution with the symptoms Follow-up with PCP per routine, sooner if symptoms worsen   Valerie Apple, MD [facility] 11/19/2015, 10:03 AM

## 2015-11-19 NOTE — Patient Instructions (Signed)
Great to see you!   Please let us know if any of this gets worse, especially if it is suddenly worse.   Please get help immediately if you have signs of a stroke listed below   [place] - Does one side of the face droop or is it numb? Ask the person to smile. Is the person's smile uneven? ARM WEAKNESS - Is one arm weak or numb? Ask the person to raise both arms. Does one arm drift downward? SPEECH DIFFICULTY - Is speech slurred? Is the person unable to speak or hard to understand? Ask the person to repeat a simple sentence, like "The sky is blue." Is the sentence repeated correctly? TIME TO CALL 9-1-1 - If someone shows any of these symptoms, even if the symptoms go away, call 9-1-1 and get the person to the hospital immediately. Check the time so you'll know when the first symptoms appeared.

## 2015-11-21 ENCOUNTER — Other Ambulatory Visit: Payer: Self-pay | Admitting: Family Medicine

## 2015-12-05 ENCOUNTER — Encounter: Payer: Self-pay | Admitting: *Deleted

## 2015-12-05 ENCOUNTER — Encounter: Payer: PPO | Admitting: *Deleted

## 2015-12-08 ENCOUNTER — Encounter: Payer: Self-pay | Admitting: Family Medicine

## 2015-12-08 ENCOUNTER — Encounter (INDEPENDENT_AMBULATORY_CARE_PROVIDER_SITE_OTHER): Payer: Self-pay

## 2015-12-08 ENCOUNTER — Ambulatory Visit (INDEPENDENT_AMBULATORY_CARE_PROVIDER_SITE_OTHER): Payer: PPO | Admitting: Family Medicine

## 2015-12-08 VITALS — BP 109/60 | HR 65 | Temp 97.0°F | Ht 64.0 in | Wt 259.6 lb

## 2015-12-08 DIAGNOSIS — F329 Major depressive disorder, single episode, unspecified: Secondary | ICD-10-CM

## 2015-12-08 DIAGNOSIS — M5137 Other intervertebral disc degeneration, lumbosacral region: Secondary | ICD-10-CM | POA: Diagnosis not present

## 2015-12-08 DIAGNOSIS — I10 Essential (primary) hypertension: Secondary | ICD-10-CM | POA: Diagnosis not present

## 2015-12-08 DIAGNOSIS — F32A Depression, unspecified: Secondary | ICD-10-CM

## 2015-12-08 MED ORDER — DICLOFENAC SODIUM 1 % TD GEL: 2.0000 g | Freq: Four times a day (QID) | TRANSDERMAL | Status: DC | PRN

## 2015-12-08 MED ORDER — CITALOPRAM HYDROBROMIDE 20 MG PO TABS: 20.0000 mg | ORAL_TABLET | Freq: Every day | ORAL | Status: DC

## 2015-12-08 MED ORDER — GABAPENTIN 400 MG PO CAPS: 800.0000 mg | ORAL_CAPSULE | Freq: Three times a day (TID) | ORAL | Status: DC

## 2015-12-08 MED ORDER — TRAZODONE HCL 100 MG PO TABS: 100.0000 mg | ORAL_TABLET | Freq: Every day | ORAL | Status: DC

## 2015-12-08 MED ORDER — NYSTATIN 100000 UNIT/ML MT SUSP: OROMUCOSAL | Status: DC

## 2015-12-08 MED ORDER — OMEPRAZOLE 40 MG PO CPDR: 40.0000 mg | DELAYED_RELEASE_CAPSULE | Freq: Two times a day (BID) | ORAL | Status: DC

## 2015-12-08 MED ORDER — BACLOFEN 20 MG PO TABS: 20.0000 mg | ORAL_TABLET | Freq: Three times a day (TID) | ORAL | Status: DC | PRN

## 2015-12-08 MED ORDER — OXYCODONE-ACETAMINOPHEN 5-325 MG PO TABS: 1.0000 | ORAL_TABLET | ORAL | Status: DC | PRN

## 2015-12-08 MED ORDER — TRIAMCINOLONE ACETONIDE 0.1 % EX CREA: TOPICAL_CREAM | Freq: Two times a day (BID) | CUTANEOUS | Status: DC

## 2015-12-08 MED ORDER — MELOXICAM 15 MG PO TABS: 15.0000 mg | ORAL_TABLET | Freq: Every day | ORAL | Status: DC

## 2015-12-08 MED ORDER — FLUOCINONIDE 0.05 % EX GEL: Freq: Two times a day (BID) | CUTANEOUS | Status: DC

## 2015-12-08 NOTE — Progress Notes (Signed)
Subjective:    Patient ID: Valerie Baker, female    DOB: Apr 27, 1956, 60 y.o.   MRN: [id number]  HPI 60 year old female here to follow-up hypertension chronic back pain. She has some concerns about several lesions on her skin today as well.  Patient Active Problem List   Diagnosis Date Noted  . Urinary frequency 04/21/2015  . Stress fracture of pelvis 11/03/2014  . Disc degeneration, lumbosacral 11/03/2014  . Osteopenia 11/03/2014  . Severe obesity (BMI >= 40) ([place]) 11/03/2014  . Essential hypertension 04/04/2009  . ASTHMA 04/04/2009  . MIGRAINES, HX OF 04/04/2009   Outpatient Encounter Prescriptions as of 12/08/2015  Medication Sig  . Alum & Mag Hydroxide-Simeth (MAGIC MOUTHWASH W/LIDOCAINE) SOLN Take 5 mLs by mouth 3 (three) times daily as needed for mouth pain.  Marland Kitchen amLODipine (NORVASC) 10 MG tablet Take 1 tablet (10 mg total) by mouth daily.  . baclofen (LIORESAL) 20 MG tablet Take 1 tablet (20 mg total) by mouth 3 (three) times daily as needed for muscle spasms.  . Calcium Carbonate-Vitamin D (CALCIUM 600+D) 600-400 MG-UNIT per tablet Take 1 tablet by mouth 2 (two) times daily.    . Cholecalciferol (VITAMIN D3) 2000 UNITS TABS Take 1 tablet by mouth daily.    . citalopram (CELEXA) 20 MG tablet Take 1 tablet (20 mg total) by mouth daily.  . fluocinonide gel (LIDEX) 0.05 % Apply topically 2 (two) times daily.  Marland Kitchen gabapentin (NEURONTIN) 400 MG capsule Take 2 capsules (800 mg total) by mouth 3 (three) times daily.  Marland Kitchen loratadine (CLARITIN) 10 MG tablet Take 10 mg by mouth daily as needed for allergies.  . meloxicam (MOBIC) 15 MG tablet Take 1 tablet (15 mg total) by mouth daily.  . Multiple Vitamin (MULTIVITAMIN) tablet Take 1 tablet by mouth daily.   Marland Kitchen nystatin (MYCOSTATIN) 100000 UNIT/ML suspension TAKE ONE TEASPOONFUL THREE TIMES A DAY  . omeprazole (PRILOSEC) 40 MG capsule Take 1 capsule (40 mg total) by mouth 2 (two) times daily.  Marland Kitchen oxybutynin (DITROPAN) 5 MG tablet Take 1  tablet (5 mg total) by mouth 2 (two) times daily.  Marland Kitchen oxyCODONE-acetaminophen (PERCOCET/ROXICET) 5-325 MG tablet Take 1 tablet by mouth every 4 (four) hours as needed for severe pain.  . raloxifene (EVISTA) 60 MG tablet TAKE ONE (1) TABLET EACH DAY  . traZODone (DESYREL) 100 MG tablet Take 1 tablet (100 mg total) by mouth at bedtime.  . triamcinolone cream (KENALOG) 0.1 % APPLY TWICE DAILY  . VENTOLIN HFA 108 (90 BASE) MCG/ACT inhaler USE 2 PUFFS EVERY 6 HOURS AS NEEDED   No facility-administered encounter medications on file as of 12/08/2015.      Review of Systems  Constitutional: Negative.   HENT: Negative.   Respiratory: Negative.   Cardiovascular: Negative.   Gastrointestinal: Negative.   Neurological: Negative.   Psychiatric/Behavioral: Negative.        Objective:   Physical Exam  Constitutional: She is oriented to person, place, and time. She appears well-developed and well-nourished.  Cardiovascular: Normal rate and regular rhythm.   Pulmonary/Chest: Effort normal and breath sounds normal.  Abdominal: Soft.  Neurological: She is alert and oriented to person, place, and time.  Skin:  There is a lesion the right malar area that is suspicious for skin cancer. Would like to have dermatology see her for that.  Also a lesion left lower quadrant that looks like an inflamed keratosis. Reassured.  Psychiatric: She has a normal mood and affect. Her behavior is normal.  Assessment & Plan:  1. Depression Him's are well managed on Celexa. - citalopram (CELEXA) 20 MG tablet; Take 1 tablet (20 mg total) by mouth daily.  Dispense: 90 tablet; Refill: 1 - Ambulatory referral to Dermatology  2. Essential hypertension Blood pressure 109/60.  3. Disc degeneration, lumbosacral Gums are controlled with 1-2 oxycodone per day. Continue same. Pain contract signed.  Wardell Honour MD

## 2015-12-13 ENCOUNTER — Telehealth: Payer: Self-pay | Admitting: Family Medicine

## 2015-12-13 NOTE — Telephone Encounter (Signed)
Rx's done 12-08-15

## 2015-12-14 ENCOUNTER — Encounter: Payer: Self-pay | Admitting: Family Medicine

## 2015-12-14 ENCOUNTER — Ambulatory Visit (INDEPENDENT_AMBULATORY_CARE_PROVIDER_SITE_OTHER): Payer: PPO | Admitting: Family Medicine

## 2015-12-14 ENCOUNTER — Telehealth: Payer: Self-pay | Admitting: Family Medicine

## 2015-12-14 VITALS — BP 131/67 | HR 66 | Temp 97.3°F | Ht 64.0 in | Wt 259.8 lb

## 2015-12-14 DIAGNOSIS — L247 Irritant contact dermatitis due to plants, except food: Secondary | ICD-10-CM | POA: Diagnosis not present

## 2015-12-14 MED ORDER — PREDNISONE 10 MG PO TABS: ORAL_TABLET | ORAL | Status: DC

## 2015-12-14 MED ORDER — TRIAMCINOLONE ACETONIDE 0.1 % EX CREA: TOPICAL_CREAM | Freq: Two times a day (BID) | CUTANEOUS | Status: DC

## 2015-12-14 MED ORDER — DICLOFENAC SODIUM 1 % TD GEL: 2.0000 g | Freq: Four times a day (QID) | TRANSDERMAL | Status: DC | PRN

## 2015-12-14 MED ORDER — NYSTATIN 100000 UNIT/ML MT SUSP: OROMUCOSAL | Status: DC

## 2015-12-14 MED ORDER — FLUOCINONIDE 0.05 % EX GEL: Freq: Two times a day (BID) | CUTANEOUS | Status: DC

## 2015-12-14 NOTE — Telephone Encounter (Signed)
All Rx's asked about from mail order pharmacy were to go to local pharmacy, reordered to local pharmacy & VM left for mail order left to not fill.

## 2015-12-14 NOTE — Telephone Encounter (Signed)
I do not know the answer to these questions how much cream will be used in 90 days. I would give her the maximum dose of a will allow and if it does not last 90 days we can refill it

## 2015-12-14 NOTE — Progress Notes (Signed)
Subjective:    Patient ID: Valerie Baker, female    DOB: 1956/04/29, 60 y.o.   MRN: [id number]  HPI 60 year old female with a 4-5 day history of dermatitis on her forearms. She has used some home remedies such as baking soda and buttermilk as well as calamine lotion. There has been no new breaking out in the last day.  Patient Active Problem List   Diagnosis Date Noted  . Urinary frequency 04/21/2015  . Stress fracture of pelvis 11/03/2014  . Disc degeneration, lumbosacral 11/03/2014  . Osteopenia 11/03/2014  . Severe obesity (BMI >= 40) ([place]) 11/03/2014  . Essential hypertension 04/04/2009  . ASTHMA 04/04/2009  . MIGRAINES, HX OF 04/04/2009   Outpatient Encounter Prescriptions as of 12/14/2015  Medication Sig  . Alum & Mag Hydroxide-Simeth (MAGIC MOUTHWASH W/LIDOCAINE) SOLN Take 5 mLs by mouth 3 (three) times daily as needed for mouth pain.  Marland Kitchen amLODipine (NORVASC) 10 MG tablet Take 1 tablet (10 mg total) by mouth daily.  . baclofen (LIORESAL) 20 MG tablet Take 1 tablet (20 mg total) by mouth 3 (three) times daily as needed for muscle spasms.  . Calcium Carbonate-Vitamin D (CALCIUM 600+D) 600-400 MG-UNIT per tablet Take 1 tablet by mouth 2 (two) times daily.    . Cholecalciferol (VITAMIN D3) 2000 UNITS TABS Take 1 tablet by mouth daily.    . citalopram (CELEXA) 20 MG tablet Take 1 tablet (20 mg total) by mouth daily.  . diclofenac sodium (VOLTAREN) 1 % GEL Apply 2 g topically 4 (four) times daily as needed.  . fluocinonide gel (LIDEX) 0.05 % Apply topically 2 (two) times daily.  Marland Kitchen gabapentin (NEURONTIN) 400 MG capsule Take 2 capsules (800 mg total) by mouth 3 (three) times daily.  Marland Kitchen loratadine (CLARITIN) 10 MG tablet Take 10 mg by mouth daily as needed for allergies.  . meloxicam (MOBIC) 15 MG tablet Take 1 tablet (15 mg total) by mouth daily.  . Multiple Vitamin (MULTIVITAMIN) tablet Take 1 tablet by mouth daily.   Marland Kitchen nystatin (MYCOSTATIN) 100000 UNIT/ML suspension TAKE ONE  TEASPOONFUL THREE TIMES A DAY  . omeprazole (PRILOSEC) 40 MG capsule Take 1 capsule (40 mg total) by mouth 2 (two) times daily.  Marland Kitchen oxybutynin (DITROPAN) 5 MG tablet Take 1 tablet (5 mg total) by mouth 2 (two) times daily.  Marland Kitchen oxyCODONE-acetaminophen (PERCOCET/ROXICET) 5-325 MG tablet Take 1 tablet by mouth every 4 (four) hours as needed for severe pain.  . raloxifene (EVISTA) 60 MG tablet TAKE ONE (1) TABLET EACH DAY  . traZODone (DESYREL) 100 MG tablet Take 1 tablet (100 mg total) by mouth at bedtime.  . triamcinolone cream (KENALOG) 0.1 % Apply topically 2 (two) times daily.  . VENTOLIN HFA 108 (90 BASE) MCG/ACT inhaler USE 2 PUFFS EVERY 6 HOURS AS NEEDED   No facility-administered encounter medications on file as of 12/14/2015.      Review of Systems  Skin: Positive for rash.       Objective:   Physical Exam  Constitutional: She appears well-developed and well-nourished.  Skin:  Rash on forearms consistent with contact dermatitis likely related to poison oak/IV          Assessment & Plan:  1. Contact dermatitis and eczema due to plant Suppress allergic response with prednisone. Treatment based on fact that she has had symptoms for 5 days. Will cover her for another 10 days with a fixed dose of prednisone for 5 days and then a quick taper over the next 5 days.  She may still use calamine lotion for soothing effect  Wardell Honour MD

## 2015-12-14 NOTE — Telephone Encounter (Signed)
Please review and advise.

## 2015-12-15 ENCOUNTER — Telehealth: Payer: Self-pay | Admitting: Family Medicine

## 2015-12-16 NOTE — Telephone Encounter (Signed)
done

## 2016-01-14 ENCOUNTER — Other Ambulatory Visit: Payer: Self-pay | Admitting: Family Medicine

## 2016-01-23 ENCOUNTER — Ambulatory Visit (INDEPENDENT_AMBULATORY_CARE_PROVIDER_SITE_OTHER): Payer: PPO | Admitting: Family Medicine

## 2016-01-23 ENCOUNTER — Encounter: Payer: Self-pay | Admitting: Family Medicine

## 2016-01-23 VITALS — BP 116/63 | HR 62 | Temp 96.5°F | Ht 64.0 in | Wt 245.0 lb

## 2016-01-23 DIAGNOSIS — R3915 Urgency of urination: Secondary | ICD-10-CM | POA: Diagnosis not present

## 2016-01-23 DIAGNOSIS — R350 Frequency of micturition: Secondary | ICD-10-CM | POA: Diagnosis not present

## 2016-01-23 DIAGNOSIS — R21 Rash and other nonspecific skin eruption: Secondary | ICD-10-CM | POA: Insufficient documentation

## 2016-01-23 MED ORDER — RALOXIFENE HCL 60 MG PO TABS: ORAL_TABLET | ORAL | Status: DC

## 2016-01-23 MED ORDER — NITROFURANTOIN MONOHYD MACRO 100 MG PO CAPS: 100.0000 mg | ORAL_CAPSULE | Freq: Two times a day (BID) | ORAL | Status: DC

## 2016-01-23 MED ORDER — METHYLPREDNISOLONE ACETATE 80 MG/ML IJ SUSP
80.0000 mg | Freq: Once | INTRAMUSCULAR | Status: AC
  Administered 2016-01-23: 80 mg via INTRAMUSCULAR

## 2016-01-23 MED ORDER — OXYCODONE-ACETAMINOPHEN 5-325 MG PO TABS: 1.0000 | ORAL_TABLET | ORAL | Status: DC | PRN

## 2016-01-23 NOTE — Progress Notes (Signed)
Subjective:    Patient ID: Valerie Baker, female    DOB: 26-Nov-1955, 60 y.o.   MRN: [id number]  HPI Patient here today for rash/poison oak and trouble with urine.  Patient has had this contact dermatitis now for about 2 weeks. She feels like she may be being reexposed. She also complains of some urinary frequency but no dysuria. She had does have a past history of renal calculi but none in the last 2-3 years.   Patient Active Problem List   Diagnosis Date Noted  . Urinary frequency 04/21/2015  . Stress fracture of pelvis 11/03/2014  . Disc degeneration, lumbosacral 11/03/2014  . Osteopenia 11/03/2014  . Severe obesity (BMI >= 40) ([place]) 11/03/2014  . Essential hypertension 04/04/2009  . ASTHMA 04/04/2009  . MIGRAINES, HX OF 04/04/2009   Outpatient Encounter Prescriptions as of 01/23/2016  Medication Sig  . Alum & Mag Hydroxide-Simeth (MAGIC MOUTHWASH W/LIDOCAINE) SOLN Take 5 mLs by mouth 3 (three) times daily as needed for mouth pain.  Marland Kitchen amLODipine (NORVASC) 10 MG tablet Take 1 tablet (10 mg total) by mouth daily.  . baclofen (LIORESAL) 20 MG tablet Take 1 tablet (20 mg total) by mouth 3 (three) times daily as needed for muscle spasms.  . Calcium Carbonate-Vitamin D (CALCIUM 600+D) 600-400 MG-UNIT per tablet Take 1 tablet by mouth 2 (two) times daily.    . Cholecalciferol (VITAMIN D3) 2000 UNITS TABS Take 1 tablet by mouth daily.    . citalopram (CELEXA) 20 MG tablet Take 1 tablet (20 mg total) by mouth daily.  . diclofenac sodium (VOLTAREN) 1 % GEL Apply 2 g topically 4 (four) times daily as needed.  . fluocinonide gel (LIDEX) 0.05 % APPLY TWICE DAILY  . gabapentin (NEURONTIN) 400 MG capsule Take 2 capsules (800 mg total) by mouth 3 (three) times daily.  Marland Kitchen loratadine (CLARITIN) 10 MG tablet Take 10 mg by mouth daily as needed for allergies.  . meloxicam (MOBIC) 15 MG tablet Take 1 tablet (15 mg total) by mouth daily.  . Multiple Vitamin (MULTIVITAMIN) tablet Take 1 tablet by  mouth daily.   Marland Kitchen nystatin (MYCOSTATIN) 100000 UNIT/ML suspension TAKE ONE TEASPOONFUL THREE TIMES A DAY  . omeprazole (PRILOSEC) 40 MG capsule Take 1 capsule (40 mg total) by mouth 2 (two) times daily.  Marland Kitchen oxybutynin (DITROPAN) 5 MG tablet Take 1 tablet (5 mg total) by mouth 2 (two) times daily.  Marland Kitchen oxyCODONE-acetaminophen (PERCOCET/ROXICET) 5-325 MG tablet Take 1 tablet by mouth every 4 (four) hours as needed for severe pain.  . raloxifene (EVISTA) 60 MG tablet TAKE ONE (1) TABLET EACH DAY  . traZODone (DESYREL) 100 MG tablet Take 1 tablet (100 mg total) by mouth at bedtime.  . triamcinolone cream (KENALOG) 0.1 % APPLY TWICE DAILY  . VENTOLIN HFA 108 (90 BASE) MCG/ACT inhaler USE 2 PUFFS EVERY 6 HOURS AS NEEDED  . [DISCONTINUED] predniSONE (DELTASONE) 10 MG tablet Take 5 tablets a day for 5 day then 4 tablets for 1 day then 3 tablets for 1 day then 2 for 1 day then 1 tablet for 1 day  . [DISCONTINUED] fluocinonide gel (LIDEX) 0.05 % Apply topically 2 (two) times daily.   No facility-administered encounter medications on file as of 01/23/2016.      Review of Systems  Constitutional: Negative.   HENT: Negative.   Eyes: Negative.   Respiratory: Negative.   Cardiovascular: Negative.   Gastrointestinal: Negative.   Endocrine: Negative.   Genitourinary: Positive for urgency and frequency.  Musculoskeletal:  Negative.   Skin: Positive for rash.  Allergic/Immunologic: Negative.   Neurological: Negative.   Hematological: Negative.   Psychiatric/Behavioral: Negative.        Objective:   Physical Exam  Constitutional: She appears well-developed and well-nourished.  Skin:  Rash mostly on arms consistent with contact dermatitis   BP 116/63 mmHg  Pulse 62  Temp(Src) 96.5 F (35.8 C) (Oral)  Ht 5\' 4"  (1.626 m)  Wt 245 lb (111.131 kg)  BMI 42.03 kg/m2        Assessment & Plan:  1. Frequent urination Rx for Macrobid. Since she has casts in her urine today I would like to repeat  urinalysis one week after completion of antibiotic therapy - Urinalysis, Complete - Urine culture   3. Rash Contact dermatitis discussed with patient pros and cons of injection versus tablets and she prefers injection. I think that's okay given the fact that the rash is been there about 2 weeks and should have run its normal course after that length of time. - methylPREDNISolone acetate (DEPO-MEDROL) injection 80 mg; Inject 1 mL (80 mg total) into the muscle once.  Wardell Honour MD

## 2016-01-24 ENCOUNTER — Telehealth: Payer: Self-pay | Admitting: Family Medicine

## 2016-01-24 NOTE — Telephone Encounter (Signed)
Patient informed that supplements should not have this effect on her urine.

## 2016-01-25 LAB — URINE CULTURE

## 2016-01-26 LAB — MICROSCOPIC EXAMINATION

## 2016-01-26 LAB — URINALYSIS, COMPLETE
BILIRUBIN UA: NEGATIVE
GLUCOSE, UA: NEGATIVE
Ketones, UA: NEGATIVE
Nitrite, UA: NEGATIVE
PH UA: 7 (ref 5.0–7.5)
PROTEIN UA: NEGATIVE
RBC UA: NEGATIVE
Specific Gravity, UA: 1.015 (ref 1.005–1.030)
UUROB: 0.2 mg/dL (ref 0.2–1.0)

## 2016-03-05 ENCOUNTER — Ambulatory Visit (INDEPENDENT_AMBULATORY_CARE_PROVIDER_SITE_OTHER): Payer: PPO | Admitting: Family

## 2016-03-05 ENCOUNTER — Encounter: Payer: Self-pay | Admitting: Family

## 2016-03-05 VITALS — BP 118/70 | HR 60 | Temp 97.5°F | Ht 64.0 in | Wt 223.4 lb

## 2016-03-05 DIAGNOSIS — L247 Irritant contact dermatitis due to plants, except food: Secondary | ICD-10-CM

## 2016-03-05 MED ORDER — TRIAMCINOLONE ACETONIDE 0.1 % EX CREA: TOPICAL_CREAM | Freq: Two times a day (BID) | CUTANEOUS | 0 refills | Status: DC

## 2016-03-05 MED ORDER — PREDNISONE 10 MG (21) PO TBPK: ORAL_TABLET | ORAL | 0 refills | Status: DC

## 2016-03-05 NOTE — Patient Instructions (Signed)
Hand Dermatitis Hand dermatitis (dyshidrotic eczema) is a skin condition in which small, itchy, raised dots or fluid-filled blisters form over the palms of the hands. Outbreaks of hand dermatitis can last 3 to 4 weeks. CAUSES  The cause of hand dermatitis is unknown. However, it occurs most often in patients with a history of allergies such as:  Hay fever.  Allergic asthma.  Allergies to latex. Chemical exposure, injuries, and environmental irritants can make hand dermatitis worse. Washing your hands too frequently can remove natural oils, which can dry out the skin and contribute to outbreaks of hand dermatitis. SYMPTOMS  The most common symptom of hand dermatitis is intense itching. Cracks or grooves (fissures) on the fingers can also develop. Affected areas can be painful, especially areas where large blisters have formed. DIAGNOSIS Your caregiver can usually tell what the problem is by doing a physical exam. PREVENTION  Avoid excessive hand washing.  Avoid the use of harsh chemicals.  Wear protective gloves when handling products that can irritate your skin. TREATMENT  Steroid creams and ointments, such as over-the-counter 1% hydrocortisone cream, can reduce inflammation and improve moisture retention. These should be applied at least 2 to 4 times per day. Your caregiver may ask you to use a stronger prescription steroid cream to help speed the healing of blistered and cracked skin. In severe cases, oral steroid medicine may be needed. If you have an infection, antibiotics may be needed. Your caregiver may also prescribe antihistamines. These medicines help reduce itching. HOME CARE INSTRUCTIONS  Only take over-the-counter or prescription medicines as directed by your caregiver.  You may use wet or cold compresses. This can help:  Alleviate itching.  Increase the effectiveness of topical creams.  Minimize blisters. SEEK MEDICAL CARE IF:  The rash is not better after 1 week of  treatment.  Signs of infection develop, such as redness, tenderness, or yellowish-white fluid (pus).  The rash is spreading.   This information is not intended to replace advice given to you by your health care provider. Make sure you discuss any questions you have with your health care provider.   Document Released: 07/30/2005 Document Revised: 10/22/2011 Document Reviewed: 02/11/2015 Elsevier Interactive Patient Education 2016 Elsevier Inc.  

## 2016-03-05 NOTE — Progress Notes (Signed)
   Subjective:    Patient ID: Valerie Baker, female    DOB: 1956-03-18, 60 y.o.   MRN: [id number]  Pt presents to the office today with a rash. Pt has been treated for contact dermatitis and eczema due to plant. PT states she has since been in the garden, but thought she "stay away" from it.  Rash  This is a recurrent problem. The current episode started more than 1 month ago. The problem has been waxing and waning since onset. Location: right hand and left. The rash is characterized by redness, itchiness and burning. She was exposed to plant contact. Pertinent negatives include no congestion, diarrhea, fever, joint pain, rhinorrhea or sore throat. Past treatments include anti-itch cream, moisturizer, cold compress and topical steroids. The treatment provided mild relief.      Review of Systems  Constitutional: Negative for fever.  HENT: Negative for congestion, rhinorrhea and sore throat.   Gastrointestinal: Negative for diarrhea.  Musculoskeletal: Negative for joint pain.  Skin: Positive for rash.  All other systems reviewed and are negative.      Objective:   Physical Exam  Constitutional: She is oriented to person, place, and time. She appears well-developed and well-nourished. No distress.  Eyes: Pupils are equal, round, and reactive to light.  Neck: Normal range of motion. Neck supple. No thyromegaly present.  Cardiovascular: Normal rate, regular rhythm, normal heart sounds and intact distal pulses.   No murmur heard. Pulmonary/Chest: Effort normal and breath sounds normal. No respiratory distress. She has no wheezes.  Abdominal: Soft. Bowel sounds are normal. She exhibits no distension. There is no tenderness.  Musculoskeletal: Normal range of motion. She exhibits no edema or tenderness.  Neurological: She is alert and oriented to person, place, and time.  Skin: Skin is warm and dry. Rash noted. There is erythema.  Erythemas rash on left and right hand.   Psychiatric: She  has a normal mood and affect. Her behavior is normal. Judgment and thought content normal.  Vitals reviewed.    BP 118/70   Pulse 60   Temp 97.5 F (36.4 C) (Oral)   Ht 5\' 4"  (1.626 m)   Wt 223 lb 6.4 oz (101.3 kg)   BMI 38.35 kg/m       Assessment & Plan:  1. Contact dermatitis and eczema due to plant -Wear protective clothing while outside- Long sleeves and long pants -Take a shower as soon as possible after being outside -Do not scratch -Good hand hygiene RTO Prn - predniSONE (STERAPRED UNI-PAK 21 TAB) 10 MG (21) TBPK tablet; Use as directed  Dispense: 21 tablet; Refill: 0 - triamcinolone cream (KENALOG) 0.1 %; Apply topically 2 (two) times daily.  Dispense: 80 g; Refill: 0   Evelina Dun, FNP

## 2016-03-09 ENCOUNTER — Other Ambulatory Visit: Payer: Self-pay | Admitting: *Deleted

## 2016-03-09 DIAGNOSIS — Z1231 Encounter for screening mammogram for malignant neoplasm of breast: Secondary | ICD-10-CM

## 2016-04-09 ENCOUNTER — Other Ambulatory Visit: Payer: Self-pay | Admitting: Family Medicine

## 2016-04-09 DIAGNOSIS — I10 Essential (primary) hypertension: Secondary | ICD-10-CM

## 2016-04-09 DIAGNOSIS — R350 Frequency of micturition: Secondary | ICD-10-CM

## 2016-04-09 MED ORDER — GABAPENTIN 400 MG PO CAPS: 800.0000 mg | ORAL_CAPSULE | Freq: Three times a day (TID) | ORAL | 0 refills | Status: DC

## 2016-04-09 MED ORDER — OXYBUTYNIN CHLORIDE 5 MG PO TABS: 5.0000 mg | ORAL_TABLET | Freq: Two times a day (BID) | ORAL | 0 refills | Status: DC

## 2016-04-09 MED ORDER — AMLODIPINE BESYLATE 10 MG PO TABS: 10.0000 mg | ORAL_TABLET | Freq: Every day | ORAL | 0 refills | Status: DC

## 2016-04-09 MED ORDER — MELOXICAM 15 MG PO TABS: 15.0000 mg | ORAL_TABLET | Freq: Every day | ORAL | 0 refills | Status: DC

## 2016-04-09 MED ORDER — BACLOFEN 20 MG PO TABS: 20.0000 mg | ORAL_TABLET | Freq: Three times a day (TID) | ORAL | 0 refills | Status: DC | PRN

## 2016-04-09 NOTE — Telephone Encounter (Signed)
done

## 2016-04-24 ENCOUNTER — Encounter: Payer: Self-pay | Admitting: Gastroenterology

## 2016-04-26 ENCOUNTER — Ambulatory Visit: Payer: PPO

## 2016-05-15 ENCOUNTER — Other Ambulatory Visit: Payer: Self-pay | Admitting: Family Medicine

## 2016-05-15 DIAGNOSIS — L247 Irritant contact dermatitis due to plants, except food: Secondary | ICD-10-CM

## 2016-05-21 ENCOUNTER — Ambulatory Visit (INDEPENDENT_AMBULATORY_CARE_PROVIDER_SITE_OTHER): Payer: PPO

## 2016-05-21 DIAGNOSIS — Z23 Encounter for immunization: Secondary | ICD-10-CM

## 2016-06-06 ENCOUNTER — Ambulatory Visit
Admission: RE | Admit: 2016-06-06 | Discharge: 2016-06-06 | Disposition: A | Discharge status: Home / Self Care | Payer: Medicare Other | Source: Ambulatory Visit | Attending: Family Medicine | Admitting: Family Medicine

## 2016-06-06 DIAGNOSIS — Z1231 Encounter for screening mammogram for malignant neoplasm of breast: Secondary | ICD-10-CM

## 2016-06-07 ENCOUNTER — Other Ambulatory Visit: Payer: Self-pay | Admitting: Family Medicine

## 2016-06-07 DIAGNOSIS — R928 Other abnormal and inconclusive findings on diagnostic imaging of breast: Secondary | ICD-10-CM

## 2016-06-08 ENCOUNTER — Telehealth: Payer: Self-pay | Admitting: Family Medicine

## 2016-06-12 ENCOUNTER — Other Ambulatory Visit: Payer: Self-pay

## 2016-06-12 ENCOUNTER — Other Ambulatory Visit: Payer: Self-pay | Admitting: Family Medicine

## 2016-06-12 DIAGNOSIS — R928 Other abnormal and inconclusive findings on diagnostic imaging of breast: Secondary | ICD-10-CM

## 2016-06-13 ENCOUNTER — Ambulatory Visit
Admission: RE | Admit: 2016-06-13 | Discharge: 2016-06-13 | Disposition: A | Discharge status: Home / Self Care | Payer: PPO | Source: Ambulatory Visit | Attending: Family Medicine | Admitting: Family Medicine

## 2016-06-13 DIAGNOSIS — N6012 Diffuse cystic mastopathy of left breast: Secondary | ICD-10-CM | POA: Diagnosis not present

## 2016-06-13 DIAGNOSIS — R928 Other abnormal and inconclusive findings on diagnostic imaging of breast: Secondary | ICD-10-CM

## 2016-06-13 DIAGNOSIS — R921 Mammographic calcification found on diagnostic imaging of breast: Secondary | ICD-10-CM | POA: Diagnosis not present

## 2016-06-13 NOTE — Telephone Encounter (Signed)
Orders placed, pt scheduled for 06/13/2016

## 2016-06-14 DIAGNOSIS — M7741 Metatarsalgia, right foot: Secondary | ICD-10-CM | POA: Diagnosis not present

## 2016-06-14 DIAGNOSIS — M79671 Pain in right foot: Secondary | ICD-10-CM | POA: Diagnosis not present

## 2016-06-14 DIAGNOSIS — M79672 Pain in left foot: Secondary | ICD-10-CM | POA: Diagnosis not present

## 2016-06-14 DIAGNOSIS — M7742 Metatarsalgia, left foot: Secondary | ICD-10-CM | POA: Diagnosis not present

## 2016-06-20 ENCOUNTER — Ambulatory Visit (INDEPENDENT_AMBULATORY_CARE_PROVIDER_SITE_OTHER): Payer: PPO | Admitting: Physician Assistant

## 2016-06-20 ENCOUNTER — Ambulatory Visit (INDEPENDENT_AMBULATORY_CARE_PROVIDER_SITE_OTHER): Payer: PPO

## 2016-06-20 DIAGNOSIS — M25561 Pain in right knee: Secondary | ICD-10-CM | POA: Diagnosis not present

## 2016-06-20 DIAGNOSIS — M7052 Other bursitis of knee, left knee: Secondary | ICD-10-CM

## 2016-06-20 DIAGNOSIS — G8929 Other chronic pain: Secondary | ICD-10-CM | POA: Diagnosis not present

## 2016-06-20 MED ORDER — METHYLPREDNISOLONE ACETATE 40 MG/ML IJ SUSP
40.0000 mg | INTRAMUSCULAR | Status: AC | PRN
  Administered 2016-06-20: 40 mg via INTRA_ARTICULAR

## 2016-06-20 MED ORDER — LIDOCAINE HCL 1 % IJ SOLN
1.0000 mL | INTRAMUSCULAR | Status: AC | PRN
  Administered 2016-06-20: 1 mL

## 2016-06-20 MED ORDER — METHYLPREDNISOLONE ACETATE 40 MG/ML IJ SUSP
40.0000 mg | INTRAMUSCULAR | Status: AC | PRN
  Administered 2016-06-20: 40 mg via INTRAMUSCULAR

## 2016-06-20 NOTE — Progress Notes (Signed)
Office Visit Note   Patient: Valerie Baker           Date of Birth: [birth year]           MRN: [id number] Visit Date: 06/20/2016              Requested by: Wardell Honour, MD [address] [place], [place] [address] PCP: Wardell Honour, MD   Assessment & Plan: Visit Diagnoses:  1. Chronic pain of right knee   2. Pes anserinus bursitis of left knee     Plan: Hamstring stretching. Monovisc handouts given will inject the right knee with supplemental injection once approved. Patient is encouraged and answered at length today tickly about supplemental injections  Follow-Up Instructions: Return for Supplemental injection.   Orders:  Orders Placed This Encounter  Procedures  . Large Joint Injection/Arthrocentesis  . Trigger Point Injection  . XR Knee 1-2 Views Right   No orders of the defined types were placed in this encounter.     Procedures: Large Joint Inj Date/Time: 06/20/2016 11:31 AM Performed by: Pete Pelt Authorized by: Pete Pelt   Consent Given by:  Patient Indications:  Pain Location:  Knee Site:  R knee Needle Size:  22 G Approach:  Anterolateral Ultrasound Guidance: No   Fluoroscopic Guidance: No   Medications:  1 mL lidocaine 1 %; 40 mg methylPREDNISolone acetate 40 MG/ML Aspiration Attempted: No   Patient tolerance:  Patient tolerated the procedure well with no immediate complications Trigger Point Inj Date/Time: 06/20/2016 11:32 AM Performed by: Pete Pelt Authorized by: Pete Pelt   Consent Given by:  Patient Indications:  Pain Total # of Trigger Points:  1 Location: lower extremity   Needle Size:  22 G Approach:  Medial Medications #1:  40 mg methylPREDNISolone acetate 40 MG/ML; 1 mL lidocaine 1 % Comments: Pes bursitis     Clinical Data: No additional findings.   Subjective: Chief Complaint  Patient presents with  . Right Knee - Pain    Patient has pretty chronic right knee pain, but recently in the  last few months, knee is worsening. She states the cortisone injection she has in 2015 was helpful, she is interested in learning about the Monovisc injection.    HPI  His Valerie Baker returns today stating that her right knee began bothering her at this summer. She did fall on the knee about 3 weeks ago but this slowly resolving in regards to pain. Now having her chronic pain syndrome the medial compartment of the right knee. She is also having some pain medial aspect of the left knee which she underwent a total knee revision in 2010. She's had no new injury to the left knee  Review of Systems See HPI  Objective: Vital Signs: There were no vitals taken for this visit.  Physical Exam  Constitutional: She is oriented to person, place, and time. She appears well-developed and well-nourished.  Pulmonary/Chest: Effort normal.  Neurological: She is alert and oriented to person, place, and time.  Skin: Skin is warm and dry.  Psychiatric: She has a normal mood and affect.    Ortho Exam Bilateral knee she has good range of motion. No instability with valgus or varus stressing. No abnormal warmth of either knee no erythema or ecchymosis no effusion. Right knee she has tenderness along the medial joint line and slight tenderness over the peripatellar. Able to perform a straight leg raise on the right. Left knee she has tenderness  over the pes bursa region.  Specialty Comments:  No specialty comments available.  Imaging: No results found.   PMFS History: Patient Active Problem List   Diagnosis Date Noted  . Frequent urination 01/23/2016  . Urgency of urination 01/23/2016  . Rash 01/23/2016  . Urinary frequency 04/21/2015  . Stress fracture of pelvis 11/03/2014  . Disc degeneration, lumbosacral 11/03/2014  . Osteopenia 11/03/2014  . Severe obesity (BMI >= 40) ([place]) 11/03/2014  . Essential hypertension 04/04/2009  . ASTHMA 04/04/2009  . MIGRAINES, HX OF 04/04/2009   Past Medical  History:  Diagnosis Date  . Adenomatous colon polyp 06/2006  . Allergy    SINUS  . Anxiety   . Anxiety and depression   . Arthritis   . Asthma   . Bladder incontinence   . Blood transfusion   . Cataract   . Depression   . Fibromyalgia   . GERD (gastroesophageal reflux disease)   . Hyperlipidemia   . Hypertension   . IBS (irritable bowel syndrome)   . Kidney stones   . Migraines   . Obesity   . Osteopenia     Family History  Problem Relation Age of Onset  . Heart disease Mother   . Diabetes Mother   . Melanoma Mother     nose  . Diabetes Brother   . Cirrhosis Brother   . Hypertension Brother   . Stroke Father   . Hypertension Sister   . Hypertension Brother     Past Surgical History:  Procedure Laterality Date  . ABDOMINAL HYSTERECTOMY    . APPENDECTOMY    . BACK SURGERY  2000  . DEBRIDEMENT TENNIS ELBOW    . ELBOW SURGERY  2003  . EYE SURGERY    . [place]   right  . LUMBAR FUSION  2007  . LUMBAR FUSION  2002,2003,2007,2008  . NASAL SINUS SURGERY  2003  . NISSEN FUNDOPLICATION  [id number]  . ROTATOR CUFF REPAIR  [id number]  . SPINAL CORD STIMULATOR IMPLANT  2005  . TOTAL KNEE ARTHROPLASTY  2008,2009   Social History   Occupational History  . Disabled    Social History Main Topics  . Smoking status: Former Smoker    Years: 5.00    Quit date: 11/02/2005  . Smokeless tobacco: Never Used  . Alcohol use No  . Drug use: No  . Sexual activity: Yes

## 2016-06-21 ENCOUNTER — Other Ambulatory Visit: Payer: Self-pay | Admitting: *Deleted

## 2016-06-21 DIAGNOSIS — R350 Frequency of micturition: Secondary | ICD-10-CM

## 2016-06-21 MED ORDER — OXYBUTYNIN CHLORIDE 5 MG PO TABS: 5.0000 mg | ORAL_TABLET | Freq: Two times a day (BID) | ORAL | 0 refills | Status: DC

## 2016-06-21 MED ORDER — CITALOPRAM HYDROBROMIDE 20 MG PO TABS: 20.0000 mg | ORAL_TABLET | Freq: Every day | ORAL | 0 refills | Status: DC

## 2016-06-21 MED ORDER — TRAZODONE HCL 100 MG PO TABS: 100.0000 mg | ORAL_TABLET | Freq: Every day | ORAL | 0 refills | Status: DC

## 2016-06-21 MED ORDER — MELOXICAM 15 MG PO TABS: 15.0000 mg | ORAL_TABLET | Freq: Every day | ORAL | 0 refills | Status: DC

## 2016-06-21 NOTE — Telephone Encounter (Signed)
Refill sent to pharmacy per Dr. Sabra Heck

## 2016-08-08 ENCOUNTER — Encounter: Payer: PPO | Admitting: *Deleted

## 2016-08-15 ENCOUNTER — Other Ambulatory Visit: Payer: Self-pay | Admitting: Family Medicine

## 2016-08-15 DIAGNOSIS — L247 Irritant contact dermatitis due to plants, except food: Secondary | ICD-10-CM

## 2016-09-07 ENCOUNTER — Other Ambulatory Visit: Payer: Self-pay | Admitting: Family Medicine

## 2016-09-07 DIAGNOSIS — L247 Irritant contact dermatitis due to plants, except food: Secondary | ICD-10-CM

## 2016-09-13 ENCOUNTER — Telehealth: Payer: Self-pay | Admitting: Family Medicine

## 2016-09-13 ENCOUNTER — Ambulatory Visit (INDEPENDENT_AMBULATORY_CARE_PROVIDER_SITE_OTHER): Payer: PPO | Admitting: Family Medicine

## 2016-09-13 ENCOUNTER — Encounter: Payer: Self-pay | Admitting: Family Medicine

## 2016-09-13 VITALS — BP 145/78 | HR 52 | Temp 97.8°F | Ht 64.0 in | Wt 224.2 lb

## 2016-09-13 DIAGNOSIS — R059 Cough, unspecified: Secondary | ICD-10-CM

## 2016-09-13 DIAGNOSIS — L247 Irritant contact dermatitis due to plants, except food: Secondary | ICD-10-CM | POA: Diagnosis not present

## 2016-09-13 DIAGNOSIS — R350 Frequency of micturition: Secondary | ICD-10-CM

## 2016-09-13 DIAGNOSIS — R05 Cough: Secondary | ICD-10-CM | POA: Diagnosis not present

## 2016-09-13 MED ORDER — GABAPENTIN 400 MG PO CAPS: 800.0000 mg | ORAL_CAPSULE | Freq: Three times a day (TID) | ORAL | 0 refills | Status: DC

## 2016-09-13 MED ORDER — TRAZODONE HCL 100 MG PO TABS: 100.0000 mg | ORAL_TABLET | Freq: Every day | ORAL | 0 refills | Status: DC

## 2016-09-13 MED ORDER — OXYBUTYNIN CHLORIDE 5 MG PO TABS: 5.0000 mg | ORAL_TABLET | Freq: Two times a day (BID) | ORAL | 0 refills | Status: DC

## 2016-09-13 MED ORDER — MELOXICAM 15 MG PO TABS: 15.0000 mg | ORAL_TABLET | Freq: Every day | ORAL | 0 refills | Status: DC

## 2016-09-13 MED ORDER — FLUTICASONE PROPIONATE 50 MCG/ACT NA SUSP
2.0000 | Freq: Every day | NASAL | 2 refills | Status: DC
Start: 2016-09-13 — End: 2017-09-19

## 2016-09-13 MED ORDER — TRIAMCINOLONE ACETONIDE 0.1 % EX CREA: TOPICAL_CREAM | Freq: Two times a day (BID) | CUTANEOUS | 0 refills | Status: DC

## 2016-09-13 MED ORDER — OXYCODONE-ACETAMINOPHEN 5-325 MG PO TABS: 1.0000 | ORAL_TABLET | ORAL | 0 refills | Status: DC | PRN

## 2016-09-13 MED ORDER — OMEPRAZOLE 40 MG PO CPDR: 40.0000 mg | DELAYED_RELEASE_CAPSULE | Freq: Two times a day (BID) | ORAL | 1 refills | Status: DC

## 2016-09-13 MED ORDER — BACLOFEN 10 MG PO TABS: 10.0000 mg | ORAL_TABLET | Freq: Three times a day (TID) | ORAL | 0 refills | Status: DC | PRN

## 2016-09-13 MED ORDER — CITALOPRAM HYDROBROMIDE 20 MG PO TABS: 20.0000 mg | ORAL_TABLET | Freq: Every day | ORAL | 0 refills | Status: DC

## 2016-09-13 NOTE — Progress Notes (Signed)
Subjective:    Patient ID: Valerie Baker, female    DOB: 1955/08/26, 61 y.o.   MRN: [id number]  HPI two-week history of cough right ear discomfort. She has had fever. She has not been using her inhaler regularly. She has tried OTC NyQuil  Patient Active Problem List   Diagnosis Date Noted  . Frequent urination 01/23/2016  . Urgency of urination 01/23/2016  . Rash 01/23/2016  . Urinary frequency 04/21/2015  . Stress fracture of pelvis 11/03/2014  . Disc degeneration, lumbosacral 11/03/2014  . Osteopenia 11/03/2014  . Severe obesity (BMI >= 40) ([place]) 11/03/2014  . Essential hypertension 04/04/2009  . ASTHMA 04/04/2009  . MIGRAINES, HX OF 04/04/2009   Outpatient Encounter Prescriptions as of 09/13/2016  Medication Sig  . Alum & Mag Hydroxide-Simeth (MAGIC MOUTHWASH W/LIDOCAINE) SOLN Take 5 mLs by mouth 3 (three) times daily as needed for mouth pain.  Marland Kitchen amLODipine (NORVASC) 10 MG tablet Take 1 tablet (10 mg total) by mouth daily.  . baclofen (LIORESAL) 10 MG tablet Take 10 mg by mouth 3 (three) times daily as needed for muscle spasms.  . Calcium Carbonate-Vitamin D (CALCIUM 600+D) 600-400 MG-UNIT per tablet Take 1 tablet by mouth 2 (two) times daily.    . Cholecalciferol (VITAMIN D3) 2000 UNITS TABS Take 1 tablet by mouth daily.    . citalopram (CELEXA) 20 MG tablet Take 1 tablet (20 mg total) by mouth daily.  . diclofenac sodium (VOLTAREN) 1 % GEL Apply 2 g topically 4 (four) times daily as needed.  . fluocinonide gel (LIDEX) 0.05 % APPLY TWICE DAILY  . gabapentin (NEURONTIN) 400 MG capsule Take 2 capsules (800 mg total) by mouth 3 (three) times daily.  Marland Kitchen loratadine (CLARITIN) 10 MG tablet Take 10 mg by mouth daily as needed for allergies.  . meloxicam (MOBIC) 15 MG tablet Take 1 tablet (15 mg total) by mouth daily.  . Multiple Vitamin (MULTIVITAMIN) tablet Take 1 tablet by mouth daily.   Marland Kitchen nystatin (MYCOSTATIN) 100000 UNIT/ML suspension TAKE ONE TEASPOONFUL THREE TIMES A DAY  .  omeprazole (PRILOSEC) 40 MG capsule Take 1 capsule (40 mg total) by mouth 2 (two) times daily.  Marland Kitchen oxybutynin (DITROPAN) 5 MG tablet Take 1 tablet (5 mg total) by mouth 2 (two) times daily.  Marland Kitchen oxyCODONE-acetaminophen (PERCOCET/ROXICET) 5-325 MG tablet Take 1 tablet by mouth every 4 (four) hours as needed for severe pain.  . traZODone (DESYREL) 100 MG tablet Take 1 tablet (100 mg total) by mouth at bedtime.  . triamcinolone cream (KENALOG) 0.1 % APPLY TWICE DAILY  . VENTOLIN HFA 108 (90 BASE) MCG/ACT inhaler USE 2 PUFFS EVERY 6 HOURS AS NEEDED  . [DISCONTINUED] predniSONE (STERAPRED UNI-PAK 21 TAB) 10 MG (21) TBPK tablet Use as directed  . [DISCONTINUED] raloxifene (EVISTA) 60 MG tablet TAKE ONE (1) TABLET EACH DAY  . [DISCONTINUED] baclofen (LIORESAL) 20 MG tablet Take 1 tablet (20 mg total) by mouth 3 (three) times daily as needed for muscle spasms.   No facility-administered encounter medications on file as of 09/13/2016.       Review of Systems  Constitutional: Negative.   HENT: Positive for congestion.   Respiratory: Positive for cough.   Cardiovascular: Negative.   Neurological: Negative.   Psychiatric/Behavioral: Negative.        Objective:   Physical Exam  Constitutional: She appears well-developed and well-nourished.  HENT:  Mouth/Throat: Oropharynx is clear and moist.  Right tympanic membrane is dull without normal light reflex compared to the left  TM  Pulmonary/Chest: Effort normal and breath sounds normal.   BP (!) 145/78   Pulse (!) 52   Temp 97.8 F (36.6 C) (Oral)   Ht 5\' 4"  (1.626 m)   Wt 224 lb 3.2 oz (101.7 kg)   BMI 38.48 kg/m         Assessment & Plan:  1. Urinary frequency Refill medicine below - oxybutynin (DITROPAN) 5 MG tablet; Take 1 tablet (5 mg total) by mouth 2 (two) times daily.  Dispense: 180 tablet; Refill: 0  2. Contact dermatitis and eczema due to plant Refill medicine as below  - triamcinolone cream (KENALOG) 0.1 %; Apply topically 2  (two) times daily.  Dispense: 80 g; Refill: 0  3. Cough I suspect this is post respiratory cough. Will treat with sample, Berdine Addison MD

## 2016-09-13 NOTE — Telephone Encounter (Signed)
Rx sent to pharmacy for patient.

## 2016-09-26 ENCOUNTER — Telehealth: Payer: Self-pay | Admitting: Family Medicine

## 2016-09-26 NOTE — Telephone Encounter (Signed)
Lm stating the directions - she may or may not call back - if calling back on the th or th - she will ask for cathy Nageezi.

## 2016-10-12 ENCOUNTER — Telehealth: Payer: Self-pay | Admitting: *Deleted

## 2016-10-12 NOTE — Telephone Encounter (Signed)
Pt requesting Baclofen RX be sent to mail order She states it normally is sent for 270 tablets Please review and advise Informed pt that Dr Sabra Heck will not be in until next week She verbalizes understanding

## 2016-10-16 MED ORDER — BACLOFEN 10 MG PO TABS: 10.0000 mg | ORAL_TABLET | Freq: Three times a day (TID) | ORAL | 1 refills | Status: DC | PRN

## 2016-10-16 NOTE — Telephone Encounter (Signed)
We please send in a Rx for baclofen per patient request

## 2016-10-16 NOTE — Telephone Encounter (Signed)
Left detailed message that RX for Baclofen was sent in to mail order  per pt request Okayed per Dr Sabra Heck

## 2016-10-19 ENCOUNTER — Ambulatory Visit (INDEPENDENT_AMBULATORY_CARE_PROVIDER_SITE_OTHER): Payer: PPO | Admitting: Physician Assistant

## 2016-10-19 ENCOUNTER — Ambulatory Visit (INDEPENDENT_AMBULATORY_CARE_PROVIDER_SITE_OTHER): Payer: PPO

## 2016-10-19 ENCOUNTER — Encounter: Payer: Self-pay | Admitting: Physician Assistant

## 2016-10-19 VITALS — BP 119/73 | HR 56 | Temp 97.8°F

## 2016-10-19 DIAGNOSIS — S20212A Contusion of left front wall of thorax, initial encounter: Secondary | ICD-10-CM | POA: Diagnosis not present

## 2016-10-19 DIAGNOSIS — R079 Chest pain, unspecified: Secondary | ICD-10-CM

## 2016-10-19 DIAGNOSIS — I10 Essential (primary) hypertension: Secondary | ICD-10-CM | POA: Diagnosis not present

## 2016-10-19 MED ORDER — PREDNISONE 10 MG (21) PO TBPK: ORAL_TABLET | ORAL | 0 refills | Status: DC

## 2016-10-19 MED ORDER — METHYLPREDNISOLONE ACETATE 80 MG/ML IJ SUSP
80.0000 mg | Freq: Once | INTRAMUSCULAR | Status: AC
  Administered 2016-10-19: 80 mg via INTRAMUSCULAR

## 2016-10-19 NOTE — Addendum Note (Signed)
Addended by: Thana Ates on: 10/19/2016 11:22 AM   Modules accepted: Orders

## 2016-10-19 NOTE — Progress Notes (Signed)
BP 119/73 (BP Location: Right Arm, Patient Position: Sitting, Cuff Size: Large)   Pulse (!) 56   Temp 97.8 F (36.6 C) (Oral)    Subjective:    Patient ID: Valerie Baker, female    DOB: 1955/10/17, 61 y.o.   MRN: [id number]  HPI: Valerie Baker is a 61 y.o. female presenting on 10/19/2016 for chest wall pain (fell x 1 wk, pain with breathing)  He is ago the patient fell without reached hands. She states she caught herself on her hands first but then ended up landing on both of her breast and upper chest. Right breast was bruised but the left is where she is having the most tenderness along the mid clavicular line. It hurts with every breath and movement. She had been doing more activity in the past couple days thinking that she was better. However this morning she got up she was extremely sore. We will have a chest x-ray performed today. It is of note that she has had significant back surgery involving the entire thoracic area and there is hardware present in her back.  Relevant past medical, surgical, family and social history reviewed and updated as indicated. Allergies and medications reviewed and updated.  Past Medical History:  Diagnosis Date  . Adenomatous colon polyp 06/2006  . Allergy    SINUS  . Anxiety   . Anxiety and depression   . Arthritis   . Asthma   . Bladder incontinence   . Blood transfusion   . Cataract   . Depression   . Fibromyalgia   . GERD (gastroesophageal reflux disease)   . Hyperlipidemia   . Hypertension   . IBS (irritable bowel syndrome)   . Kidney stones   . Migraines   . Obesity   . Osteopenia     Past Surgical History:  Procedure Laterality Date  . ABDOMINAL HYSTERECTOMY    . APPENDECTOMY    . BACK SURGERY  2000  . DEBRIDEMENT TENNIS ELBOW    . ELBOW SURGERY  2003  . EYE SURGERY    . [place]   right  . LUMBAR FUSION  2007  . LUMBAR FUSION  2002,2003,2007,2008  . NASAL SINUS SURGERY  2003  . NISSEN FUNDOPLICATION   5701  . ROTATOR CUFF REPAIR  [id number]  . SPINAL CORD STIMULATOR IMPLANT  2005  . TOTAL KNEE ARTHROPLASTY  2008,2009    Review of Systems  Constitutional: Negative.  Negative for activity change, fatigue and fever.  HENT: Negative.   Eyes: Negative.   Respiratory: Negative.  Negative for cough, shortness of breath and wheezing.   Cardiovascular: Positive for chest pain. Negative for palpitations and leg swelling.  Gastrointestinal: Negative.  Negative for abdominal pain.  Endocrine: Negative.   Genitourinary: Negative.  Negative for dysuria.  Musculoskeletal: Positive for arthralgias and back pain.  Skin: Negative.   Neurological: Negative.     Allergies as of 10/19/2016      Reactions   Sulfonamide Derivatives Rash      Medication List       Accurate as of 10/19/16  9:11 AM. Always use your most recent med list.          amLODipine 10 MG tablet Commonly known as:  NORVASC Take 1 tablet (10 mg total) by mouth daily.   baclofen 10 MG tablet Commonly known as:  LIORESAL Take 1 tablet (10 mg total) by mouth 3 (three) times daily as needed for muscle spasms.  CALCIUM 600+D 600-400 MG-UNIT tablet Generic drug:  Calcium Carbonate-Vitamin D Take 1 tablet by mouth 2 (two) times daily.   citalopram 20 MG tablet Commonly known as:  CELEXA Take 1 tablet (20 mg total) by mouth daily.   diclofenac sodium 1 % Gel Commonly known as:  VOLTAREN Apply 2 g topically 4 (four) times daily as needed.   fluocinonide gel 0.05 % Commonly known as:  LIDEX APPLY TWICE DAILY   fluticasone 50 MCG/ACT nasal spray Commonly known as:  FLONASE Place 2 sprays into both nostrils daily.   gabapentin 400 MG capsule Commonly known as:  NEURONTIN Take 2 capsules (800 mg total) by mouth 3 (three) times daily.   loratadine 10 MG tablet Commonly known as:  CLARITIN Take 10 mg by mouth daily as needed for allergies.   magic mouthwash w/lidocaine Soln Take 5 mLs by mouth 3 (three) times daily  as needed for mouth pain.   meloxicam 15 MG tablet Commonly known as:  MOBIC Take 1 tablet (15 mg total) by mouth daily.   multivitamin tablet Take 1 tablet by mouth daily.   nystatin 100000 UNIT/ML suspension Commonly known as:  MYCOSTATIN TAKE ONE TEASPOONFUL THREE TIMES A DAY   omeprazole 40 MG capsule Commonly known as:  PRILOSEC Take 1 capsule (40 mg total) by mouth 2 (two) times daily.   oxybutynin 5 MG tablet Commonly known as:  DITROPAN Take 1 tablet (5 mg total) by mouth 2 (two) times daily.   oxyCODONE-acetaminophen 5-325 MG tablet Commonly known as:  PERCOCET/ROXICET Take 1 tablet by mouth every 4 (four) hours as needed for severe pain.   predniSONE 10 MG (21) Tbpk tablet Commonly known as:  STERAPRED UNI-PAK 21 TAB As directed x 6 days   traZODone 100 MG tablet Commonly known as:  DESYREL Take 1 tablet (100 mg total) by mouth at bedtime.   triamcinolone cream 0.1 % Commonly known as:  KENALOG Apply topically 2 (two) times daily.   VENTOLIN HFA 108 (90 Base) MCG/ACT inhaler Generic drug:  albuterol USE 2 PUFFS EVERY 6 HOURS AS NEEDED   Vitamin D3 2000 units Tabs Take 1 tablet by mouth daily.          Objective:    BP 119/73 (BP Location: Right Arm, Patient Position: Sitting, Cuff Size: Large)   Pulse (!) 56   Temp 97.8 F (36.6 C) (Oral)   Allergies  Allergen Reactions  . Sulfonamide Derivatives Rash    Physical Exam  Constitutional: She is oriented to person, place, and time. She appears well-developed and well-nourished.  HENT:  Head: Normocephalic and atraumatic.  Eyes: Conjunctivae and EOM are normal. Pupils are equal, round, and reactive to light.  Cardiovascular: Normal rate, regular rhythm, normal heart sounds and intact distal pulses.  Exam reveals no gallop.   No murmur heard. Pulmonary/Chest: Effort normal and breath sounds normal. She has no wheezes. She exhibits no tenderness.  Abdominal: Soft. Bowel sounds are normal.    Musculoskeletal:       Right shoulder: She exhibits tenderness and pain. She exhibits no swelling and no effusion.       Arms: Neurological: She is alert and oriented to person, place, and time. She has normal reflexes.  Skin: Skin is warm and dry. No rash noted.  Psychiatric: She has a normal mood and affect. Her behavior is normal. Judgment and thought content normal.  Nursing note and vitals reviewed.       Assessment & Plan:   1.  Chest pain, unspecified type - EKG 12-Lead - DG Chest 2 View - methylPREDNISolone acetate (DEPO-MEDROL) injection 80 mg; Inject 1 mL (80 mg total) into the muscle once.  2. Chest wall contusion, left, initial encounter - DG Chest 2 View - methylPREDNISolone acetate (DEPO-MEDROL) injection 80 mg; Inject 1 mL (80 mg total) into the muscle once.  3. Essential hypertension Continue meds, recheck 4 weeks   Continue all other maintenance medications as listed above.  Follow up plan: Return in about 4 weeks (around 11/16/2016) for recheck meds and BP.  Educational handout given for chest wall pain  Terald Sleeper PA-C [facility] [address]  [place], [place] [address] [phone]   10/19/2016, 9:11 AM

## 2016-10-19 NOTE — Patient Instructions (Signed)
Chest Wall Pain Chest wall pain is pain in or around the bones and muscles of your chest. Sometimes, an injury causes this pain. Sometimes, the cause may not be known. This pain may take several weeks or longer to get better. Follow these instructions at home: Pay attention to any changes in your symptoms. Take these actions to help with your pain:  Rest as told by your doctor.  Avoid activities that cause pain. Try not to use your chest, belly (abdominal), or side muscles to lift heavy things.  If directed, apply ice to the painful area:  Put ice in a plastic bag.  Place a towel between your skin and the bag.  Leave the ice on for 20 minutes, 2-3 times per day.  Take over-the-counter and prescription medicines only as told by your doctor.  Do not use tobacco products, including cigarettes, chewing tobacco, and e-cigarettes. If you need help quitting, ask your doctor.  Keep all follow-up visits as told by your doctor. This is important. Contact a doctor if:  You have a fever.  Your chest pain gets worse.  You have new symptoms. Get help right away if:  You feel sick to your stomach (nauseous) or you throw up (vomit).  You feel sweaty or light-headed.  You have a cough with phlegm (sputum) or you cough up blood.  You are short of breath. This information is not intended to replace advice given to you by your health care provider. Make sure you discuss any questions you have with your health care provider. Document Released: 01/16/2008 Document Revised: 01/05/2016 Document Reviewed: 10/25/2014 Elsevier Interactive Patient Education  2017 [employer].

## 2016-11-19 ENCOUNTER — Telehealth: Payer: Self-pay | Admitting: Physician Assistant

## 2016-11-19 ENCOUNTER — Other Ambulatory Visit: Payer: Self-pay | Admitting: *Deleted

## 2016-11-19 DIAGNOSIS — I10 Essential (primary) hypertension: Secondary | ICD-10-CM

## 2016-11-19 NOTE — Telephone Encounter (Signed)
plz put in order for lab before her appt on the 19th to see angel jones

## 2016-11-19 NOTE — Telephone Encounter (Signed)
CMP, CBC, LIPID, TSH for well labs.  Appears to hyperlipidemia.

## 2016-11-20 ENCOUNTER — Ambulatory Visit: Payer: PPO | Admitting: Physician Assistant

## 2016-11-23 ENCOUNTER — Telehealth (INDEPENDENT_AMBULATORY_CARE_PROVIDER_SITE_OTHER): Payer: Self-pay | Admitting: Physician Assistant

## 2016-11-23 NOTE — Telephone Encounter (Signed)
Pt calling about inj that lubricates her knee. She wants to check the status.   [phone]

## 2016-11-27 ENCOUNTER — Ambulatory Visit: Payer: PPO | Admitting: Physician Assistant

## 2016-11-28 ENCOUNTER — Other Ambulatory Visit: Payer: PPO

## 2016-11-28 DIAGNOSIS — I10 Essential (primary) hypertension: Secondary | ICD-10-CM | POA: Diagnosis not present

## 2016-11-29 ENCOUNTER — Ambulatory Visit (INDEPENDENT_AMBULATORY_CARE_PROVIDER_SITE_OTHER): Payer: PPO | Admitting: Physician Assistant

## 2016-11-29 ENCOUNTER — Encounter: Payer: Self-pay | Admitting: Physician Assistant

## 2016-11-29 VITALS — BP 113/74 | HR 64 | Temp 98.6°F | Ht 64.0 in | Wt 235.0 lb

## 2016-11-29 DIAGNOSIS — F339 Major depressive disorder, recurrent, unspecified: Secondary | ICD-10-CM | POA: Diagnosis not present

## 2016-11-29 DIAGNOSIS — M5137 Other intervertebral disc degeneration, lumbosacral region: Secondary | ICD-10-CM | POA: Diagnosis not present

## 2016-11-29 DIAGNOSIS — R350 Frequency of micturition: Secondary | ICD-10-CM

## 2016-11-29 DIAGNOSIS — L247 Irritant contact dermatitis due to plants, except food: Secondary | ICD-10-CM | POA: Diagnosis not present

## 2016-11-29 DIAGNOSIS — N3281 Overactive bladder: Secondary | ICD-10-CM | POA: Diagnosis not present

## 2016-11-29 DIAGNOSIS — I10 Essential (primary) hypertension: Secondary | ICD-10-CM | POA: Diagnosis not present

## 2016-11-29 LAB — CMP14+EGFR
A/G RATIO: 1.7 (ref 1.2–2.2)
ALBUMIN: 4 g/dL (ref 3.6–4.8)
ALT: 19 IU/L (ref 0–32)
AST: 23 IU/L (ref 0–40)
Alkaline Phosphatase: 85 IU/L (ref 39–117)
BILIRUBIN TOTAL: 0.3 mg/dL (ref 0.0–1.2)
BUN / CREAT RATIO: 31 — AB (ref 12–28)
BUN: 22 mg/dL (ref 8–27)
CALCIUM: 9.7 mg/dL (ref 8.7–10.3)
CO2: 26 mmol/L (ref 18–29)
Chloride: 100 mmol/L (ref 96–106)
Creatinine, Ser: 0.72 mg/dL (ref 0.57–1.00)
GFR, EST AFRICAN AMERICAN: 105 mL/min/{1.73_m2} (ref >59)
GFR, EST NON AFRICAN AMERICAN: 91 mL/min/{1.73_m2} (ref >59)
Globulin, Total: 2.4 g/dL (ref 1.5–4.5)
Glucose: 85 mg/dL (ref 65–99)
POTASSIUM: 4.6 mmol/L (ref 3.5–5.2)
Sodium: 140 mmol/L (ref 134–144)
TOTAL PROTEIN: 6.4 g/dL (ref 6.0–8.5)

## 2016-11-29 LAB — LIPID PANEL
CHOL/HDL RATIO: 2.8 ratio (ref 0.0–4.4)
Cholesterol, Total: 227 mg/dL — ABNORMAL HIGH (ref 100–199)
HDL: 80 mg/dL (ref >39)
LDL Calculated: 125 mg/dL — ABNORMAL HIGH (ref 0–99)
Triglycerides: 111 mg/dL (ref 0–149)
VLDL CHOLESTEROL CAL: 22 mg/dL (ref 5–40)

## 2016-11-29 LAB — CBC WITH DIFFERENTIAL/PLATELET
BASOS: 1 %
Basophils Absolute: 0 10*3/uL (ref 0.0–0.2)
EOS (ABSOLUTE): 0.3 10*3/uL (ref 0.0–0.4)
Eos: 6 %
HEMOGLOBIN: 12.4 g/dL (ref 11.1–15.9)
Hematocrit: 37.4 % (ref 34.0–46.6)
IMMATURE GRANULOCYTES: 0 %
Immature Grans (Abs): 0 10*3/uL (ref 0.0–0.1)
LYMPHS ABS: 1.9 10*3/uL (ref 0.7–3.1)
Lymphs: 39 %
MCH: 30.5 pg (ref 26.6–33.0)
MCHC: 33.2 g/dL (ref 31.5–35.7)
MCV: 92 fL (ref 79–97)
MONOS ABS: 0.4 10*3/uL (ref 0.1–0.9)
Monocytes: 8 %
NEUTROS ABS: 2.3 10*3/uL (ref 1.4–7.0)
Neutrophils: 46 %
PLATELETS: 201 10*3/uL (ref 150–379)
RBC: 4.07 x10E6/uL (ref 3.77–5.28)
RDW: 14 % (ref 12.3–15.4)
WBC: 5 10*3/uL (ref 3.4–10.8)

## 2016-11-29 LAB — TSH: TSH: 3.81 u[IU]/mL (ref 0.450–4.500)

## 2016-11-29 MED ORDER — CITALOPRAM HYDROBROMIDE 20 MG PO TABS: 20.0000 mg | ORAL_TABLET | Freq: Every day | ORAL | 3 refills | Status: DC

## 2016-11-29 MED ORDER — OXYBUTYNIN CHLORIDE 5 MG PO TABS: 5.0000 mg | ORAL_TABLET | Freq: Two times a day (BID) | ORAL | 3 refills | Status: DC

## 2016-11-29 MED ORDER — GABAPENTIN 400 MG PO CAPS: 800.0000 mg | ORAL_CAPSULE | Freq: Three times a day (TID) | ORAL | 3 refills | Status: DC

## 2016-11-29 MED ORDER — TRIAMCINOLONE ACETONIDE 0.1 % EX CREA: TOPICAL_CREAM | Freq: Two times a day (BID) | CUTANEOUS | 5 refills | Status: DC

## 2016-11-29 MED ORDER — TRAZODONE HCL 100 MG PO TABS: 100.0000 mg | ORAL_TABLET | Freq: Every day | ORAL | 3 refills | Status: DC

## 2016-11-29 MED ORDER — OXYCODONE-ACETAMINOPHEN 5-325 MG PO TABS: 1.0000 | ORAL_TABLET | ORAL | 0 refills | Status: DC | PRN

## 2016-11-29 MED ORDER — DICLOFENAC SODIUM 1 % TD GEL: 4.0000 g | Freq: Four times a day (QID) | TRANSDERMAL | 0 refills | Status: DC | PRN

## 2016-11-29 MED ORDER — MELOXICAM 15 MG PO TABS: 15.0000 mg | ORAL_TABLET | Freq: Every day | ORAL | 3 refills | Status: DC

## 2016-11-29 NOTE — Patient Instructions (Signed)
Arthritis Arthritis means joint pain. It can also mean joint disease. A joint is a place where bones come together. People who have arthritis may have:  Red joints.  Swollen joints.  Stiff joints.  Warm joints.  A fever.  A feeling of being sick. Follow these instructions at home: Pay attention to any changes in your symptoms. Take these actions to help with your pain and swelling. Medicines   Take over-the-counter and prescription medicines only as told by your doctor.  Do not take aspirin for pain if your doctor says that you may have gout. Activity   Rest your joint if your doctor tells you to.  Avoid activities that make the pain worse.  Exercise your joint regularly as told by your doctor. Try doing exercises like:  Swimming.  Water aerobics.  Biking.  Walking. Joint Care    If your joint is swollen, keep it raised (elevated) if told by your doctor.  If your joint feels stiff in the morning, try taking a warm shower.  If you have diabetes, do not apply heat without asking your doctor.  If told, apply heat to the joint:  Put a towel between the joint and the hot pack or heating pad.  Leave the heat on the area for 20-30 minutes.  If told, apply ice to the joint:  Put ice in a plastic bag.  Place a towel between your skin and the bag.  Leave the ice on for 20 minutes, 2-3 times per day.  Keep all follow-up visits as told by your doctor. Contact a doctor if:  The pain gets worse.  You have a fever. Get help right away if:  You have very bad pain in your joint.  You have swelling in your joint.  Your joint is red.  Many joints become painful and swollen.  You have very bad back pain.  Your leg is very weak.  You cannot control your pee (urine) or poop (stool). This information is not intended to replace advice given to you by your health care provider. Make sure you discuss any questions you have with your health care  provider. Document Released: 10/24/2009 Document Revised: 01/05/2016 Document Reviewed: 10/25/2014 Elsevier Interactive Patient Education  2017 [employer] Inc[employer]  

## 2016-11-29 NOTE — Telephone Encounter (Signed)
Can you do me a favor and schedule her with Artis Delay or Ninfa Linden whenever available (Her insurance will pay 80%, she will owe the other 20%, which may be about $200, if she wonders) Thank you so much Joycelyn Schmid

## 2016-11-29 NOTE — Progress Notes (Signed)
BP 113/74   Pulse 64   Temp 98.6 F (37 C) (Oral)   Ht '5\' 4"'$  (1.626 m)   Wt 235 lb (106.6 kg)   BMI 40.34 kg/m    Subjective:    Patient ID: Valerie Baker, female    DOB: October 05, 1955, 61 y.o.   MRN: [id number]  HPI: Valerie Baker is a 61 y.o. female presenting on 11/29/2016 for Follow-up (1 month )  This patient comes in for periodic recheck on medications and conditions including Hypertension, osteoarthritis, degenerative disc disease with chronic pain. She has a very good job only using her medication as needed. Generally 120 tablets will last 3 months and sometimes even up to 4 months. Her labs were performed and we have discussed those. She only has an ASCVD risk of 3.3% and therefore does not need treatment.   All medications are reviewed today. There are no reports of any problems with the medications. All of the medical conditions are reviewed and updated.  Lab work is reviewed and will be ordered as medically necessary. There are no new problems reported with today's visit.   Relevant past medical, surgical, family and social history reviewed and updated as indicated. Allergies and medications reviewed and updated.  Past Medical History:  Diagnosis Date  . Adenomatous colon polyp 06/2006  . Allergy    SINUS  . Anxiety   . Anxiety and depression   . Arthritis   . Asthma   . Bladder incontinence   . Blood transfusion   . Cataract   . Depression   . Fibromyalgia   . GERD (gastroesophageal reflux disease)   . Hyperlipidemia   . Hypertension   . IBS (irritable bowel syndrome)   . Kidney stones   . Migraines   . Obesity   . Osteopenia     Past Surgical History:  Procedure Laterality Date  . ABDOMINAL HYSTERECTOMY    . APPENDECTOMY    . BACK SURGERY  2000  . DEBRIDEMENT TENNIS ELBOW    . ELBOW SURGERY  2003  . EYE SURGERY    . [place]   right  . LUMBAR FUSION  2007  . LUMBAR FUSION  2002,2003,2007,2008  . NASAL SINUS SURGERY  2003  .  NISSEN FUNDOPLICATION  2778  . ROTATOR CUFF REPAIR  [id number]  . SPINAL CORD STIMULATOR IMPLANT  2005  . TOTAL KNEE ARTHROPLASTY  2008,2009    Review of Systems  Constitutional: Negative.  Negative for activity change, fatigue and fever.  HENT: Negative.   Eyes: Negative.   Respiratory: Negative.  Negative for cough.   Cardiovascular: Negative.  Negative for chest pain.  Gastrointestinal: Negative.  Negative for abdominal pain.  Endocrine: Negative.   Genitourinary: Positive for frequency. Negative for dysuria.  Musculoskeletal: Positive for arthralgias, back pain, gait problem and myalgias.  Skin: Positive for color change and rash.    Allergies as of 11/29/2016      Reactions   Sulfonamide Derivatives Rash      Medication List       Accurate as of 11/29/16  2:58 PM. Always use your most recent med list.          amLODipine 10 MG tablet Commonly known as:  NORVASC Take 1 tablet (10 mg total) by mouth daily.   baclofen 10 MG tablet Commonly known as:  LIORESAL Take 1 tablet (10 mg total) by mouth 3 (three) times daily as needed for muscle spasms.   CALCIUM 600+D  600-400 MG-UNIT tablet Generic drug:  Calcium Carbonate-Vitamin D Take 1 tablet by mouth 2 (two) times daily.   citalopram 20 MG tablet Commonly known as:  CELEXA Take 1 tablet (20 mg total) by mouth daily.   diclofenac sodium 1 % Gel Commonly known as:  VOLTAREN Apply 4 g topically 4 (four) times daily as needed.   fluocinonide gel 0.05 % Commonly known as:  LIDEX APPLY TWICE DAILY   fluticasone 50 MCG/ACT nasal spray Commonly known as:  FLONASE Place 2 sprays into both nostrils daily.   gabapentin 400 MG capsule Commonly known as:  NEURONTIN Take 2 capsules (800 mg total) by mouth 3 (three) times daily.   loratadine 10 MG tablet Commonly known as:  CLARITIN Take 10 mg by mouth daily as needed for allergies.   magic mouthwash w/lidocaine Soln Take 5 mLs by mouth 3 (three) times daily as  needed for mouth pain.   meloxicam 15 MG tablet Commonly known as:  MOBIC Take 1 tablet (15 mg total) by mouth daily.   multivitamin tablet Take 1 tablet by mouth daily.   nystatin 100000 UNIT/ML suspension Commonly known as:  MYCOSTATIN TAKE ONE TEASPOONFUL THREE TIMES A DAY   omeprazole 40 MG capsule Commonly known as:  PRILOSEC Take 1 capsule (40 mg total) by mouth 2 (two) times daily.   oxybutynin 5 MG tablet Commonly known as:  DITROPAN Take 1 tablet (5 mg total) by mouth 2 (two) times daily.   oxyCODONE-acetaminophen 5-325 MG tablet Commonly known as:  PERCOCET/ROXICET Take 1 tablet by mouth every 4 (four) hours as needed for severe pain.   traZODone 100 MG tablet Commonly known as:  DESYREL Take 1 tablet (100 mg total) by mouth at bedtime.   triamcinolone cream 0.1 % Commonly known as:  KENALOG Apply topically 2 (two) times daily.   VENTOLIN HFA 108 (90 Base) MCG/ACT inhaler Generic drug:  albuterol USE 2 PUFFS EVERY 6 HOURS AS NEEDED   Vitamin D3 2000 units Tabs Take 1 tablet by mouth daily.          Objective:    BP 113/74   Pulse 64   Temp 98.6 F (37 C) (Oral)   Ht '5\' 4"'$  (1.626 m)   Wt 235 lb (106.6 kg)   BMI 40.34 kg/m   Allergies  Allergen Reactions  . Sulfonamide Derivatives Rash    Physical Exam  Constitutional: She is oriented to person, place, and time. She appears well-developed and well-nourished.  HENT:  Head: Normocephalic and atraumatic.  Eyes: Conjunctivae and EOM are normal. Pupils are equal, round, and reactive to light.  Cardiovascular: Normal rate, regular rhythm, normal heart sounds and intact distal pulses.   Pulmonary/Chest: Effort normal and breath sounds normal.  Abdominal: Soft. Bowel sounds are normal.  Musculoskeletal:       Lumbar back: She exhibits decreased range of motion, tenderness, pain and spasm.  Neurological: She is alert and oriented to person, place, and time. She has normal reflexes.  Skin: Skin is  warm and dry. No rash noted.  Psychiatric: She has a normal mood and affect. Her behavior is normal. Judgment and thought content normal.    Results for orders placed or performed in visit on 11/28/16  CBC with Differential/Platelet  Result Value Ref Range   WBC 5.0 3.4 - 10.8 x10E3/uL   RBC 4.07 3.77 - 5.28 x10E6/uL   Hemoglobin 12.4 11.1 - 15.9 g/dL   Hematocrit 37.4 34.0 - 46.6 %   MCV 92  79 - 97 fL   MCH 30.5 26.6 - 33.0 pg   MCHC 33.2 31.5 - 35.7 g/dL   RDW 14.0 12.3 - 15.4 %   Platelets 201 150 - 379 x10E3/uL   Neutrophils 46 Not Estab. %   Lymphs 39 Not Estab. %   Monocytes 8 Not Estab. %   Eos 6 Not Estab. %   Basos 1 Not Estab. %   Neutrophils Absolute 2.3 1.4 - 7.0 x10E3/uL   Lymphocytes Absolute 1.9 0.7 - 3.1 x10E3/uL   Monocytes Absolute 0.4 0.1 - 0.9 x10E3/uL   EOS (ABSOLUTE) 0.3 0.0 - 0.4 x10E3/uL   Basophils Absolute 0.0 0.0 - 0.2 x10E3/uL   Immature Granulocytes 0 Not Estab. %   Immature Grans (Abs) 0.0 0.0 - 0.1 x10E3/uL  CMP14+EGFR  Result Value Ref Range   Glucose 85 65 - 99 mg/dL   BUN 22 8 - 27 mg/dL   Creatinine, Ser 0.72 0.57 - 1.00 mg/dL   GFR calc non Af Amer 91 >59 mL/min/1.73   GFR calc Af Amer 105 >59 mL/min/1.73   BUN/Creatinine Ratio 31 (H) 12 - 28   Sodium 140 134 - 144 mmol/L   Potassium 4.6 3.5 - 5.2 mmol/L   Chloride 100 96 - 106 mmol/L   CO2 26 18 - 29 mmol/L   Calcium 9.7 8.7 - 10.3 mg/dL   Total Protein 6.4 6.0 - 8.5 g/dL   Albumin 4.0 3.6 - 4.8 g/dL   Globulin, Total 2.4 1.5 - 4.5 g/dL   Albumin/Globulin Ratio 1.7 1.2 - 2.2   Bilirubin Total 0.3 0.0 - 1.2 mg/dL   Alkaline Phosphatase 85 39 - 117 IU/L   AST 23 0 - 40 IU/L   ALT 19 0 - 32 IU/L  Lipid panel  Result Value Ref Range   Cholesterol, Total 227 (H) 100 - 199 mg/dL   Triglycerides 111 0 - 149 mg/dL   HDL 80 >39 mg/dL   VLDL Cholesterol Cal 22 5 - 40 mg/dL   LDL Calculated 125 (H) 0 - 99 mg/dL   Chol/HDL Ratio 2.8 0.0 - 4.4 ratio  TSH  Result Value Ref Range   TSH  3.810 0.450 - 4.500 uIU/mL      Assessment & Plan:   1. Contact dermatitis and eczema due to plant - triamcinolone cream (KENALOG) 0.1 %; Apply topically 2 (two) times daily.  Dispense: 453.6 g; Refill: 5  2. Urinary frequency - oxybutynin (DITROPAN) 5 MG tablet; Take 1 tablet (5 mg total) by mouth 2 (two) times daily.  Dispense: 180 tablet; Refill: 3  3. Essential hypertension  4. Disc degeneration, lumbosacral - diclofenac sodium (VOLTAREN) 1 % GEL; Apply 4 g topically 4 (four) times daily as needed.  Dispense: 300 g; Refill: 0 - meloxicam (MOBIC) 15 MG tablet; Take 1 tablet (15 mg total) by mouth daily.  Dispense: 90 tablet; Refill: 3 - gabapentin (NEURONTIN) 400 MG capsule; Take 2 capsules (800 mg total) by mouth 3 (three) times daily.  Dispense: 540 capsule; Refill: 3 - oxyCODONE-acetaminophen (PERCOCET/ROXICET) 5-325 MG tablet; Take 1 tablet by mouth every 4 (four) hours as needed for severe pain.  Dispense: 120 tablet; Refill: 0  5. Overactive bladder  6. Depression, recurrent ([place]) - traZODone (DESYREL) 100 MG tablet; Take 1 tablet (100 mg total) by mouth at bedtime.  Dispense: 90 tablet; Refill: 3 - citalopram (CELEXA) 20 MG tablet; Take 1 tablet (20 mg total) by mouth daily.  Dispense: 90 tablet; Refill: 3  Continue all other maintenance medications as listed above.  Follow up plan: Return in about 6 months (around 05/31/2017).  Educational handout given for arthritis  Terald Sleeper PA-C [place] [address]  [place], [place] [address] [phone]   11/29/2016, 2:58 PM

## 2016-11-30 NOTE — Telephone Encounter (Signed)
Talked with patient and advised her of message concerning Monovisc Injection.

## 2016-11-30 NOTE — Telephone Encounter (Signed)
Patient scheduled with Dr Ninfa Linden 12/25/16 @ 9:00 am

## 2016-11-30 NOTE — Telephone Encounter (Signed)
Patient would like to know if the injection is one time or if it is a series?  CB# is [phone].

## 2016-11-30 NOTE — Telephone Encounter (Signed)
Do me a favor and call her back and let her know it's just the one. Its monovisc

## 2016-12-03 ENCOUNTER — Ambulatory Visit: Payer: PPO | Admitting: Physician Assistant

## 2016-12-25 ENCOUNTER — Encounter (INDEPENDENT_AMBULATORY_CARE_PROVIDER_SITE_OTHER): Payer: Self-pay

## 2016-12-25 ENCOUNTER — Ambulatory Visit (INDEPENDENT_AMBULATORY_CARE_PROVIDER_SITE_OTHER): Payer: PPO | Admitting: Orthopaedic Surgery

## 2016-12-25 DIAGNOSIS — G8929 Other chronic pain: Secondary | ICD-10-CM

## 2016-12-25 DIAGNOSIS — M25561 Pain in right knee: Secondary | ICD-10-CM

## 2016-12-25 NOTE — Progress Notes (Signed)
The patient is here for a scheduled hyaluronic acid injection in her right knee. This is to treat knee pain and moderate arthritis. She still is working on weight loss and quad strengthening exercises. She does water aerobics as well.  On examination her pain is mainly on the medial joint line. She has mild varus malalignment.  Prepped the superior lateral aspect of her knee is better alcohol and provided a Monovisc injection without complications. We'll see her back in 3 months to see if itself. All questions were encouraged and answered.

## 2017-01-09 DIAGNOSIS — M791 Myalgia: Secondary | ICD-10-CM | POA: Diagnosis not present

## 2017-01-09 DIAGNOSIS — G894 Chronic pain syndrome: Secondary | ICD-10-CM | POA: Diagnosis not present

## 2017-01-09 DIAGNOSIS — M542 Cervicalgia: Secondary | ICD-10-CM | POA: Diagnosis not present

## 2017-01-09 DIAGNOSIS — M545 Low back pain: Secondary | ICD-10-CM | POA: Diagnosis not present

## 2017-01-09 DIAGNOSIS — M5416 Radiculopathy, lumbar region: Secondary | ICD-10-CM | POA: Diagnosis not present

## 2017-01-25 DIAGNOSIS — G894 Chronic pain syndrome: Secondary | ICD-10-CM | POA: Diagnosis not present

## 2017-01-25 DIAGNOSIS — M47892 Other spondylosis, cervical region: Secondary | ICD-10-CM | POA: Diagnosis not present

## 2017-01-25 DIAGNOSIS — M545 Low back pain: Secondary | ICD-10-CM | POA: Diagnosis not present

## 2017-01-25 DIAGNOSIS — M542 Cervicalgia: Secondary | ICD-10-CM | POA: Diagnosis not present

## 2017-01-25 DIAGNOSIS — M791 Myalgia: Secondary | ICD-10-CM | POA: Diagnosis not present

## 2017-02-27 ENCOUNTER — Ambulatory Visit: Payer: PPO | Admitting: Family Medicine

## 2017-02-27 ENCOUNTER — Encounter: Payer: Self-pay | Admitting: Family Medicine

## 2017-02-27 ENCOUNTER — Ambulatory Visit (INDEPENDENT_AMBULATORY_CARE_PROVIDER_SITE_OTHER): Payer: PPO | Admitting: Family Medicine

## 2017-02-27 VITALS — BP 143/79 | HR 49 | Temp 97.7°F | Ht 64.0 in | Wt 220.6 lb

## 2017-02-27 DIAGNOSIS — L247 Irritant contact dermatitis due to plants, except food: Secondary | ICD-10-CM | POA: Diagnosis not present

## 2017-02-27 MED ORDER — TRIAMCINOLONE ACETONIDE 40 MG/ML IJ SUSP
40.0000 mg | Freq: Once | INTRAMUSCULAR | Status: AC
  Administered 2017-02-27: 40 mg via INTRAMUSCULAR

## 2017-02-27 MED ORDER — PREDNISONE 10 MG PO TABS: ORAL_TABLET | ORAL | 0 refills | Status: DC

## 2017-02-27 NOTE — Progress Notes (Signed)
S: One-day history of dermatitis. Patient had been picking blueberries and noted the rash yesterday very pruritic. She has been using calamine lotion. Rash involves her face neck and both arms.  O: Exam limited to skin: There is a linear pattern on her face and neck and arms consistent with contact dermatitis. Would assume this is related to [place] Patient has been treated with injections before and requests that today.  A,P: Allergic dermatitis We'll give her a short acting dose of Kenalog 40 mg IM today then begin on a taper dose of prednisone, 60 mg 2 days, 50 mg 2 days 40 mg 2 days 30 mg 2 days 20 mg 2 days 10 mg 2 days then discontinue May use calamine lotion until symptoms improve

## 2017-02-27 NOTE — Progress Notes (Signed)
   Subjective:    Patient ID: Valerie Baker, female    DOB: 1956/01/30, 61 y.o.   MRN: [id number]  HPI  Patient Active Problem List   Diagnosis Date Noted  . Contact dermatitis and eczema due to plant 11/29/2016  . Overactive bladder 11/29/2016  . Frequent urination 01/23/2016  . Urgency of urination 01/23/2016  . Rash 01/23/2016  . Urinary frequency 04/21/2015  . Stress fracture of pelvis 11/03/2014  . Disc degeneration, lumbosacral 11/03/2014  . Osteopenia 11/03/2014  . Severe obesity (BMI >= 40) ([place]) 11/03/2014  . Essential hypertension 04/04/2009  . ASTHMA 04/04/2009  . MIGRAINES, HX OF 04/04/2009   Outpatient Encounter Prescriptions as of 02/27/2017  Medication Sig  . amLODipine (NORVASC) 10 MG tablet Take 1 tablet (10 mg total) by mouth daily.  . baclofen (LIORESAL) 10 MG tablet Take 1 tablet (10 mg total) by mouth 3 (three) times daily as needed for muscle spasms.  . Calcium Carbonate-Vitamin D (CALCIUM 600+D) 600-400 MG-UNIT per tablet Take 1 tablet by mouth 2 (two) times daily.    . Cholecalciferol (VITAMIN D3) 2000 UNITS TABS Take 1 tablet by mouth daily.    . citalopram (CELEXA) 20 MG tablet Take 1 tablet (20 mg total) by mouth daily.  . diclofenac sodium (VOLTAREN) 1 % GEL Apply 4 g topically 4 (four) times daily as needed.  . fluocinonide gel (LIDEX) 0.05 % APPLY TWICE DAILY  . fluticasone (FLONASE) 50 MCG/ACT nasal spray Place 2 sprays into both nostrils daily.  Marland Kitchen gabapentin (NEURONTIN) 400 MG capsule Take 2 capsules (800 mg total) by mouth 3 (three) times daily.  Marland Kitchen loratadine (CLARITIN) 10 MG tablet Take 10 mg by mouth daily as needed for allergies.  . meloxicam (MOBIC) 15 MG tablet Take 1 tablet (15 mg total) by mouth daily.  . Multiple Vitamin (MULTIVITAMIN) tablet Take 1 tablet by mouth daily.   Marland Kitchen nystatin (MYCOSTATIN) 100000 UNIT/ML suspension TAKE ONE TEASPOONFUL THREE TIMES A DAY  . omeprazole (PRILOSEC) 40 MG capsule Take 1 capsule (40 mg total) by mouth  2 (two) times daily.  Marland Kitchen oxybutynin (DITROPAN) 5 MG tablet Take 1 tablet (5 mg total) by mouth 2 (two) times daily.  Marland Kitchen oxyCODONE-acetaminophen (PERCOCET/ROXICET) 5-325 MG tablet Take 1 tablet by mouth every 4 (four) hours as needed for severe pain.  . traZODone (DESYREL) 100 MG tablet Take 1 tablet (100 mg total) by mouth at bedtime.  . triamcinolone cream (KENALOG) 0.1 % Apply topically 2 (two) times daily.  . VENTOLIN HFA 108 (90 BASE) MCG/ACT inhaler USE 2 PUFFS EVERY 6 HOURS AS NEEDED  . Alum & Mag Hydroxide-Simeth (MAGIC MOUTHWASH W/LIDOCAINE) SOLN Take 5 mLs by mouth 3 (three) times daily as needed for mouth pain. (Patient not taking: Reported on 02/27/2017)   No facility-administered encounter medications on file as of 02/27/2017.       Review of Systems     Objective:   Physical Exam BP (!) 143/79   Pulse (!) 49   Temp 97.7 F (36.5 C) (Oral)   Ht 5\' 4"  (1.626 m)   Wt 220 lb 9.6 oz (100.1 kg)   BMI 37.87 kg/m         Assessment & Plan:

## 2017-02-27 NOTE — Addendum Note (Signed)
Addended by: Antonietta Barcelona D on: 02/27/2017 03:43 PM   Modules accepted: Orders

## 2017-03-14 DIAGNOSIS — H40033 Anatomical narrow angle, bilateral: Secondary | ICD-10-CM | POA: Diagnosis not present

## 2017-03-14 DIAGNOSIS — H2513 Age-related nuclear cataract, bilateral: Secondary | ICD-10-CM | POA: Diagnosis not present

## 2017-03-25 ENCOUNTER — Ambulatory Visit (INDEPENDENT_AMBULATORY_CARE_PROVIDER_SITE_OTHER): Payer: PPO | Admitting: Orthopaedic Surgery

## 2017-04-22 ENCOUNTER — Other Ambulatory Visit: Payer: Self-pay | Admitting: Family Medicine

## 2017-04-22 ENCOUNTER — Other Ambulatory Visit: Payer: Self-pay | Admitting: Physician Assistant

## 2017-04-22 DIAGNOSIS — M5137 Other intervertebral disc degeneration, lumbosacral region: Secondary | ICD-10-CM

## 2017-04-23 ENCOUNTER — Ambulatory Visit (INDEPENDENT_AMBULATORY_CARE_PROVIDER_SITE_OTHER): Payer: PPO | Admitting: Physician Assistant

## 2017-04-23 ENCOUNTER — Encounter: Payer: Self-pay | Admitting: Physician Assistant

## 2017-04-23 ENCOUNTER — Telehealth: Payer: Self-pay | Admitting: Physician Assistant

## 2017-04-23 VITALS — BP 116/83 | HR 57 | Temp 97.2°F | Ht 64.0 in | Wt 219.0 lb

## 2017-04-23 DIAGNOSIS — M5137 Other intervertebral disc degeneration, lumbosacral region: Secondary | ICD-10-CM | POA: Diagnosis not present

## 2017-04-23 DIAGNOSIS — M7551 Bursitis of right shoulder: Secondary | ICD-10-CM | POA: Diagnosis not present

## 2017-04-23 MED ORDER — ALBUTEROL SULFATE HFA 108 (90 BASE) MCG/ACT IN AERS: INHALATION_SPRAY | RESPIRATORY_TRACT | 6 refills | Status: DC

## 2017-04-23 MED ORDER — PREDNISONE 10 MG (21) PO TBPK: ORAL_TABLET | ORAL | 0 refills | Status: DC

## 2017-04-23 MED ORDER — DICLOFENAC SODIUM 1 % TD GEL: TRANSDERMAL | 5 refills | Status: DC

## 2017-04-23 MED ORDER — NAPROXEN 500 MG PO TABS: 500.0000 mg | ORAL_TABLET | Freq: Two times a day (BID) | ORAL | 1 refills | Status: DC

## 2017-04-23 MED ORDER — METHYLPREDNISOLONE ACETATE 80 MG/ML IJ SUSP
80.0000 mg | Freq: Once | INTRAMUSCULAR | Status: AC
  Administered 2017-04-23: 80 mg via INTRAMUSCULAR

## 2017-04-23 MED ORDER — MAGIC MOUTHWASH W/LIDOCAINE: 5.0000 mL | Freq: Three times a day (TID) | ORAL | 2 refills | Status: DC | PRN

## 2017-04-23 NOTE — Patient Instructions (Signed)
Shoulder Exercises Ask your health care provider which exercises are safe for you. Do exercises exactly as told by your health care provider and adjust them as directed. It is normal to feel mild stretching, pulling, tightness, or discomfort as you do these exercises, but you should stop right away if you feel sudden pain or your pain gets worse.Do not begin these exercises until told by your health care provider. RANGE OF MOTION EXERCISES These exercises warm up your muscles and joints and improve the movement and flexibility of your shoulder. These exercises also help to relieve pain, numbness, and tingling. These exercises involve stretching your injured shoulder directly. Exercise A: Pendulum  1. Stand near a wall or a surface that you can hold onto for balance. 2. Bend at the waist and let your left / right arm hang straight down. Use your other arm to support you. Keep your back straight and do not lock your knees. 3. Relax your left / right arm and shoulder muscles, and move your hips and your trunk so your left / right arm swings freely. Your arm should swing because of the motion of your body, not because you are using your arm or shoulder muscles. 4. Keep moving your body so your arm swings in the following directions, as told by your health care provider: ? Side to side. ? Forward and backward. ? In clockwise and counterclockwise circles. 5. Continue each motion for __________ seconds, or for as long as told by your health care provider. 6. Slowly return to the starting position. Repeat __________ times. Complete this exercise __________ times a day. Exercise B:Flexion, Standing  1. Stand and hold a broomstick, a cane, or a similar object. Place your hands a little more than shoulder-width apart on the object. Your left / right hand should be palm-up, and your other hand should be palm-down. 2. Keep your elbow straight and keep your shoulder muscles relaxed. Push the stick down with  your healthy arm to raise your left / right arm in front of your body, and then over your head until you feel a stretch in your shoulder. ? Avoid shrugging your shoulder while you raise your arm. Keep your shoulder blade tucked down toward the middle of your back. 3. Hold for __________ seconds. 4. Slowly return to the starting position. Repeat __________ times. Complete this exercise __________ times a day. Exercise C: Abduction, Standing 1. Stand and hold a broomstick, a cane, or a similar object. Place your hands a little more than shoulder-width apart on the object. Your left / right hand should be palm-up, and your other hand should be palm-down. 2. While keeping your elbow straight and your shoulder muscles relaxed, push the stick across your body toward your left / right side. Raise your left / right arm to the side of your body and then over your head until you feel a stretch in your shoulder. ? Do not raise your arm above shoulder height, unless your health care provider tells you to do that. ? Avoid shrugging your shoulder while you raise your arm. Keep your shoulder blade tucked down toward the middle of your back. 3. Hold for __________ seconds. 4. Slowly return to the starting position. Repeat __________ times. Complete this exercise __________ times a day. Exercise D:Internal Rotation  1. Place your left / right hand behind your back, palm-up. 2. Use your other hand to dangle an exercise band, a towel, or a similar object over your shoulder. Grasp the band with   your left / right hand so you are holding onto both ends. 3. Gently pull up on the band until you feel a stretch in the front of your left / right shoulder. ? Avoid shrugging your shoulder while you raise your arm. Keep your shoulder blade tucked down toward the middle of your back. 4. Hold for __________ seconds. 5. Release the stretch by letting go of the band and lowering your hands. Repeat __________ times. Complete  this exercise __________ times a day. STRETCHING EXERCISES These exercises warm up your muscles and joints and improve the movement and flexibility of your shoulder. These exercises also help to relieve pain, numbness, and tingling. These exercises are done using your healthy shoulder to help stretch the muscles of your injured shoulder. Exercise E: Corner Stretch (External Rotation and Abduction)  1. Stand in a doorway with one of your feet slightly in front of the other. This is called a staggered stance. If you cannot reach your forearms to the door frame, stand facing a corner of a room. 2. Choose one of the following positions as told by your health care provider: ? Place your hands and forearms on the door frame above your head. ? Place your hands and forearms on the door frame at the height of your head. ? Place your hands on the door frame at the height of your elbows. 3. Slowly move your weight onto your front foot until you feel a stretch across your chest and in the front of your shoulders. Keep your head and chest upright and keep your abdominal muscles tight. 4. Hold for __________ seconds. 5. To release the stretch, shift your weight to your back foot. Repeat __________ times. Complete this stretch __________ times a day. Exercise F:Extension, Standing 1. Stand and hold a broomstick, a cane, or a similar object behind your back. ? Your hands should be a little wider than shoulder-width apart. ? Your palms should face away from your back. 2. Keeping your elbows straight and keeping your shoulder muscles relaxed, move the stick away from your body until you feel a stretch in your shoulder. ? Avoid shrugging your shoulders while you move the stick. Keep your shoulder blade tucked down toward the middle of your back. 3. Hold for __________ seconds. 4. Slowly return to the starting position. Repeat __________ times. Complete this exercise __________ times a day. STRENGTHENING  EXERCISES These exercises build strength and endurance in your shoulder. Endurance is the ability to use your muscles for a long time, even after they get tired. Exercise G:External Rotation  1. Sit in a stable chair without armrests. 2. Secure an exercise band at elbow height on your left / right side. 3. Place a soft object, such as a folded towel or a small pillow, between your left / right upper arm and your body to move your elbow a few inches away (about 10 cm) from your side. 4. Hold the end of the band so it is tight and there is no slack. 5. Keeping your elbow pressed against the soft object, move your left / right forearm out, away from your abdomen. Keep your body steady so only your forearm moves. 6. Hold for __________ seconds. 7. Slowly return to the starting position. Repeat __________ times. Complete this exercise __________ times a day. Exercise H:Shoulder Abduction  1. Sit in a stable chair without armrests, or stand. 2. Hold a __________ weight in your left / right hand, or hold an exercise band with both hands.   3. Start with your arms straight down and your left / right palm facing in, toward your body. 4. Slowly lift your left / right hand out to your side. Do not lift your hand above shoulder height unless your health care provider tells you that this is safe. ? Keep your arms straight. ? Avoid shrugging your shoulder while you do this movement. Keep your shoulder blade tucked down toward the middle of your back. 5. Hold for __________ seconds. 6. Slowly lower your arm, and return to the starting position. Repeat __________ times. Complete this exercise __________ times a day. Exercise I:Shoulder Extension 1. Sit in a stable chair without armrests, or stand. 2. Secure an exercise band to a stable object in front of you where it is at shoulder height. 3. Hold one end of the exercise band in each hand. Your palms should face each other. 4. Straighten your elbows and  lift your hands up to shoulder height. 5. Step back, away from the secured end of the exercise band, until the band is tight and there is no slack. 6. Squeeze your shoulder blades together as you pull your hands down to the sides of your thighs. Stop when your hands are straight down by your sides. Do not let your hands go behind your body. 7. Hold for __________ seconds. 8. Slowly return to the starting position. Repeat __________ times. Complete this exercise __________ times a day. Exercise J:Standing Shoulder Row 1. Sit in a stable chair without armrests, or stand. 2. Secure an exercise band to a stable object in front of you so it is at waist height. 3. Hold one end of the exercise band in each hand. Your palms should be in a thumbs-up position. 4. Bend each of your elbows to an "L" shape (about 90 degrees) and keep your upper arms at your sides. 5. Step back until the band is tight and there is no slack. 6. Slowly pull your elbows back behind you. 7. Hold for __________ seconds. 8. Slowly return to the starting position. Repeat __________ times. Complete this exercise __________ times a day. Exercise K:Shoulder Press-Ups  1. Sit in a stable chair that has armrests. Sit upright, with your feet flat on the floor. 2. Put your hands on the armrests so your elbows are bent and your fingers are pointing forward. Your hands should be about even with the sides of your body. 3. Push down on the armrests and use your arms to lift yourself off of the chair. Straighten your elbows and lift yourself up as much as you comfortably can. ? Move your shoulder blades down, and avoid letting your shoulders move up toward your ears. ? Keep your feet on the ground. As you get stronger, your feet should support less of your body weight as you lift yourself up. 4. Hold for __________ seconds. 5. Slowly lower yourself back into the chair. Repeat __________ times. Complete this exercise __________ times a  day. Exercise L: Wall Push-Ups  1. Stand so you are facing a stable wall. Your feet should be about one arm-length away from the wall. 2. Lean forward and place your palms on the wall at shoulder height. 3. Keep your feet flat on the floor as you bend your elbows and lean forward toward the wall. 4. Hold for __________ seconds. 5. Straighten your elbows to push yourself back to the starting position. Repeat __________ times. Complete this exercise __________ times a day. This information is not intended to replace advice   given to you by your health care provider. Make sure you discuss any questions you have with your health care provider. Document Released: 06/13/2005 Document Revised: 04/23/2016 Document Reviewed: 04/10/2015 Elsevier Interactive Patient Education  2018 [employer]  

## 2017-04-23 NOTE — Telephone Encounter (Signed)
Pt aware.

## 2017-04-23 NOTE — Telephone Encounter (Signed)
Sent the prednisone

## 2017-04-23 NOTE — Progress Notes (Signed)
BP 116/83   Pulse (!) 57   Temp (!) 97.2 F (36.2 C) (Oral)   Ht 5\' 4"  (1.626 m)   Wt 219 lb (99.3 kg)   BMI 37.59 kg/m    Subjective:    Patient ID: Valerie Baker, female    DOB: February 06, 1956, 61 y.o.   MRN: [id number]  HPI: Valerie Baker is a 61 y.o. female presenting on 04/23/2017 for Right arm/shoulder pain  A couple of weeks ago the pain in the anterior portion of her left shoulder and arm began. Has decreased ROM and pain when it reaches 90 degrees.  No crepitus. Has problems with right and left Shoulder and spurs in the past.  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  Relevant past medical, surgical, family and social history reviewed and updated as indicated. Allergies and medications reviewed and updated.  Past Medical History:  Diagnosis Date  . Adenomatous colon polyp 06/2006  . Allergy    SINUS  . Anxiety   . Anxiety and depression   . Arthritis   . Asthma   . Bladder incontinence   . Blood transfusion   . Cataract   . Depression   . Fibromyalgia   . GERD (gastroesophageal reflux disease)   . Hyperlipidemia   . Hypertension   . IBS (irritable bowel syndrome)   . Kidney stones   . Migraines   . Obesity   . Osteopenia     Past Surgical History:  Procedure Laterality Date  . ABDOMINAL HYSTERECTOMY    . APPENDECTOMY    . BACK SURGERY  2000  . DEBRIDEMENT TENNIS ELBOW    . ELBOW SURGERY  2003  . EYE SURGERY    . [place]   right  . LUMBAR FUSION  2007  . LUMBAR FUSION  2002,2003,2007,2008  . NASAL SINUS SURGERY  2003  . NISSEN FUNDOPLICATION  3267  . ROTATOR CUFF REPAIR  [id number]  . SPINAL CORD STIMULATOR IMPLANT  2005  . TOTAL KNEE ARTHROPLASTY  2008,2009    Review of Systems  Constitutional: Negative.   HENT: Negative.   Eyes: Negative.   Respiratory: Negative.   Gastrointestinal: Negative.   Genitourinary: Negative.   Musculoskeletal: Positive for arthralgias and joint swelling.     Allergies as of 04/23/2017      Reactions   Sulfonamide Derivatives Rash      Medication List       Accurate as of 04/23/17 10:45 AM. Always use your most recent med list.          albuterol 108 (90 Base) MCG/ACT inhaler Commonly known as:  VENTOLIN HFA USE 2 PUFFS EVERY 6 HOURS AS NEEDED   amLODipine 10 MG tablet Commonly known as:  NORVASC Take 1 tablet (10 mg total) by mouth daily.   baclofen 10 MG tablet Commonly known as:  LIORESAL Take 1 tablet (10 mg total) by mouth 3 (three) times daily as needed for muscle spasms.   CALCIUM 600+D 600-400 MG-UNIT tablet Generic drug:  Calcium Carbonate-Vitamin D Take 1 tablet by mouth 2 (two) times daily.   citalopram 20 MG tablet Commonly known as:  CELEXA Take 1 tablet (20 mg total) by mouth daily.   diclofenac sodium 1 % Gel Commonly known as:  VOLTAREN APPLY 4 GRAMS 4 TIMES DAILY AS NEEDED   fluocinonide gel 0.05 % Commonly known as:  LIDEX APPLY TWICE DAILY   fluticasone 50 MCG/ACT nasal spray Commonly known  as:  FLONASE Place 2 sprays into both nostrils daily.   gabapentin 400 MG capsule Commonly known as:  NEURONTIN Take 2 capsules (800 mg total) by mouth 3 (three) times daily.   loratadine 10 MG tablet Commonly known as:  CLARITIN Take 10 mg by mouth daily as needed for allergies.   magic mouthwash w/lidocaine Soln Take 5 mLs by mouth 3 (three) times daily as needed for mouth pain.   meloxicam 15 MG tablet Commonly known as:  MOBIC Take 1 tablet (15 mg total) by mouth daily.   multivitamin tablet Take 1 tablet by mouth daily.   naproxen 500 MG tablet Commonly known as:  NAPROSYN Take 1 tablet (500 mg total) by mouth 2 (two) times daily with a meal.   nystatin 100000 UNIT/ML suspension Commonly known as:  MYCOSTATIN TAKE ONE TEASPOONFUL THREE TIMES A DAY   omeprazole 40 MG capsule Commonly known as:  PRILOSEC Take 1 capsule (40 mg total) by mouth 2 (two) times daily.   oxybutynin 5 MG  tablet Commonly known as:  DITROPAN Take 1 tablet (5 mg total) by mouth 2 (two) times daily.   oxyCODONE-acetaminophen 5-325 MG tablet Commonly known as:  PERCOCET/ROXICET Take 1 tablet by mouth every 4 (four) hours as needed for severe pain.   traZODone 100 MG tablet Commonly known as:  DESYREL Take 1 tablet (100 mg total) by mouth at bedtime.   triamcinolone cream 0.1 % Commonly known as:  KENALOG Apply topically 2 (two) times daily.   Vitamin D3 2000 units Tabs Take 1 tablet by mouth daily.            Discharge Care Instructions        Start     Ordered   04/23/17 1045  METHYLPREDNISOLONE ACETATE 80 MG/ML IJ SUSP   Once     04/23/17 1042   04/23/17 0000  magic mouthwash w/lidocaine SOLN  3 times daily PRN    Question:  Supervising Provider  Answer:  Timmothy Euler   04/23/17 1042   04/23/17 0000  albuterol (VENTOLIN HFA) 108 (90 Base) MCG/ACT inhaler    Question:  Supervising Provider  Answer:  Kenn File L   04/23/17 1042   04/23/17 0000  naproxen (NAPROSYN) 500 MG tablet  2 times daily with meals    Question:  Supervising Provider  Answer:  Timmothy Euler   04/23/17 1042   04/23/17 0000  diclofenac sodium (VOLTAREN) 1 % GEL    Question:  Supervising Provider  Answer:  Timmothy Euler   04/23/17 1042         Objective:    BP 116/83   Pulse (!) 57   Temp (!) 97.2 F (36.2 C) (Oral)   Ht 5\' 4"  (1.626 m)   Wt 219 lb (99.3 kg)   BMI 37.59 kg/m   Allergies  Allergen Reactions  . Sulfonamide Derivatives Rash    Physical Exam  Constitutional: She is oriented to person, place, and time. She appears well-developed and well-nourished.  HENT:  Head: Normocephalic and atraumatic.  Eyes: Pupils are equal, round, and reactive to light. Conjunctivae and EOM are normal.  Cardiovascular: Normal rate, regular rhythm, normal heart sounds and intact distal pulses.   Pulmonary/Chest: Effort normal and breath sounds normal.  Abdominal: Soft. Bowel  sounds are normal.  Musculoskeletal:       Right shoulder: She exhibits decreased range of motion, tenderness, pain and spasm. She exhibits no crepitus and normal strength.  Arms: Neurological: She is alert and oriented to person, place, and time. She has normal reflexes.  Skin: Skin is warm and dry. No rash noted.  Psychiatric: She has a normal mood and affect. Her behavior is normal. Judgment and thought content normal.        Assessment & Plan:   1. Bursitis of right shoulder - methylPREDNISolone acetate (DEPO-MEDROL) injection 80 mg; Inject 1 mL (80 mg total) into the muscle once.  2. Disc degeneration, lumbosacral - diclofenac sodium (VOLTAREN) 1 % GEL; APPLY 4 GRAMS 4 TIMES DAILY AS NEEDED  Dispense: 300 g; Refill: 5    Current Outpatient Prescriptions:  .  albuterol (VENTOLIN HFA) 108 (90 Base) MCG/ACT inhaler, USE 2 PUFFS EVERY 6 HOURS AS NEEDED, Disp: 18 g, Rfl: 6 .  amLODipine (NORVASC) 10 MG tablet, Take 1 tablet (10 mg total) by mouth daily., Disp: 90 tablet, Rfl: 0 .  baclofen (LIORESAL) 10 MG tablet, Take 1 tablet (10 mg total) by mouth 3 (three) times daily as needed for muscle spasms., Disp: 270 each, Rfl: 1 .  Calcium Carbonate-Vitamin D (CALCIUM 600+D) 600-400 MG-UNIT per tablet, Take 1 tablet by mouth 2 (two) times daily.  , Disp: , Rfl:  .  Cholecalciferol (VITAMIN D3) 2000 UNITS TABS, Take 1 tablet by mouth daily.  , Disp: , Rfl:  .  citalopram (CELEXA) 20 MG tablet, Take 1 tablet (20 mg total) by mouth daily., Disp: 90 tablet, Rfl: 3 .  diclofenac sodium (VOLTAREN) 1 % GEL, APPLY 4 GRAMS 4 TIMES DAILY AS NEEDED, Disp: 300 g, Rfl: 5 .  fluocinonide gel (LIDEX) 0.05 %, APPLY TWICE DAILY, Disp: 60 g, Rfl: 0 .  fluticasone (FLONASE) 50 MCG/ACT nasal spray, Place 2 sprays into both nostrils daily., Disp: 16 g, Rfl: 2 .  gabapentin (NEURONTIN) 400 MG capsule, Take 2 capsules (800 mg total) by mouth 3 (three) times daily., Disp: 540 capsule, Rfl: 3 .  loratadine  (CLARITIN) 10 MG tablet, Take 10 mg by mouth daily as needed for allergies., Disp: , Rfl:  .  magic mouthwash w/lidocaine SOLN, Take 5 mLs by mouth 3 (three) times daily as needed for mouth pain., Disp: 240 mL, Rfl: 2 .  meloxicam (MOBIC) 15 MG tablet, Take 1 tablet (15 mg total) by mouth daily., Disp: 90 tablet, Rfl: 3 .  Multiple Vitamin (MULTIVITAMIN) tablet, Take 1 tablet by mouth daily. , Disp: , Rfl:  .  nystatin (MYCOSTATIN) 100000 UNIT/ML suspension, TAKE ONE TEASPOONFUL THREE TIMES A DAY, Disp: 120 mL, Rfl: 0 .  omeprazole (PRILOSEC) 40 MG capsule, Take 1 capsule (40 mg total) by mouth 2 (two) times daily., Disp: 180 capsule, Rfl: 1 .  oxybutynin (DITROPAN) 5 MG tablet, Take 1 tablet (5 mg total) by mouth 2 (two) times daily., Disp: 180 tablet, Rfl: 3 .  oxyCODONE-acetaminophen (PERCOCET/ROXICET) 5-325 MG tablet, Take 1 tablet by mouth every 4 (four) hours as needed for severe pain., Disp: 120 tablet, Rfl: 0 .  traZODone (DESYREL) 100 MG tablet, Take 1 tablet (100 mg total) by mouth at bedtime., Disp: 90 tablet, Rfl: 3 .  triamcinolone cream (KENALOG) 0.1 %, Apply topically 2 (two) times daily., Disp: 453.6 g, Rfl: 5 .  naproxen (NAPROSYN) 500 MG tablet, Take 1 tablet (500 mg total) by mouth 2 (two) times daily with a meal., Disp: 60 tablet, Rfl: 1  Current Facility-Administered Medications:  .  methylPREDNISolone acetate (DEPO-MEDROL) injection 80 mg, 80 mg, Intramuscular, Once, Terald Sleeper, PA-C Continue all other  maintenance medications as listed above.  Follow up plan: Return if symptoms worsen or fail to improve.  Educational handout given for shoulder exercises  Terald Sleeper PA-C [facility] [address]  [place], [place] [id number] [phone]   04/23/2017, 10:45 AM

## 2017-04-24 ENCOUNTER — Other Ambulatory Visit: Payer: Self-pay | Admitting: Family Medicine

## 2017-04-24 ENCOUNTER — Ambulatory Visit: Payer: PPO | Admitting: Physician Assistant

## 2017-04-25 ENCOUNTER — Telehealth: Payer: Self-pay | Admitting: Physician Assistant

## 2017-04-26 ENCOUNTER — Telehealth: Payer: Self-pay | Admitting: Physician Assistant

## 2017-04-26 NOTE — Telephone Encounter (Signed)
Patient notified that rx sent to [facility]

## 2017-05-07 ENCOUNTER — Other Ambulatory Visit: Payer: Self-pay | Admitting: Physician Assistant

## 2017-05-07 DIAGNOSIS — R921 Mammographic calcification found on diagnostic imaging of breast: Secondary | ICD-10-CM

## 2017-05-16 ENCOUNTER — Other Ambulatory Visit: Payer: Self-pay | Admitting: Physician Assistant

## 2017-05-16 ENCOUNTER — Ambulatory Visit
Admission: RE | Admit: 2017-05-16 | Discharge: 2017-05-16 | Disposition: A | Discharge status: Home / Self Care | Payer: PPO | Source: Ambulatory Visit | Attending: Physician Assistant | Admitting: Physician Assistant

## 2017-05-16 DIAGNOSIS — R921 Mammographic calcification found on diagnostic imaging of breast: Secondary | ICD-10-CM

## 2017-05-22 ENCOUNTER — Inpatient Hospital Stay: Admission: RE | Admit: 2017-05-22 | Payer: PPO | Source: Ambulatory Visit

## 2017-05-22 DIAGNOSIS — H10013 Acute follicular conjunctivitis, bilateral: Secondary | ICD-10-CM | POA: Diagnosis not present

## 2017-05-22 DIAGNOSIS — S0502XA Injury of conjunctiva and corneal abrasion without foreign body, left eye, initial encounter: Secondary | ICD-10-CM | POA: Diagnosis not present

## 2017-05-23 DIAGNOSIS — H10013 Acute follicular conjunctivitis, bilateral: Secondary | ICD-10-CM | POA: Diagnosis not present

## 2017-05-27 ENCOUNTER — Ambulatory Visit: Payer: PPO | Admitting: Physician Assistant

## 2017-05-31 ENCOUNTER — Ambulatory Visit: Payer: PPO | Admitting: Physician Assistant

## 2017-06-24 ENCOUNTER — Encounter: Payer: Self-pay | Admitting: Physician Assistant

## 2017-06-24 ENCOUNTER — Ambulatory Visit: Payer: PPO | Admitting: Physician Assistant

## 2017-06-24 VITALS — BP 143/96 | HR 57 | Temp 97.4°F | Ht 64.0 in | Wt 224.0 lb

## 2017-06-24 DIAGNOSIS — Z23 Encounter for immunization: Secondary | ICD-10-CM

## 2017-06-24 DIAGNOSIS — I10 Essential (primary) hypertension: Secondary | ICD-10-CM

## 2017-06-24 DIAGNOSIS — M5137 Other intervertebral disc degeneration, lumbosacral region: Secondary | ICD-10-CM

## 2017-06-24 DIAGNOSIS — M8580 Other specified disorders of bone density and structure, unspecified site: Secondary | ICD-10-CM | POA: Diagnosis not present

## 2017-06-24 MED ORDER — OXYCODONE-ACETAMINOPHEN 5-325 MG PO TABS: 1.0000 | ORAL_TABLET | ORAL | 0 refills | Status: DC | PRN

## 2017-06-24 MED ORDER — BACLOFEN 10 MG PO TABS: ORAL_TABLET | ORAL | 3 refills | Status: DC

## 2017-06-24 MED ORDER — DICLOFENAC SODIUM 1 % TD GEL: TRANSDERMAL | 5 refills | Status: DC

## 2017-06-24 MED ORDER — FLUOCINONIDE 0.05 % EX GEL: Freq: Two times a day (BID) | CUTANEOUS | 6 refills | Status: DC

## 2017-06-24 NOTE — Progress Notes (Signed)
BP (!) 143/96   Pulse (!) 57   Temp (!) 97.4 F (36.3 C) (Oral)   Ht 5\' 4"  (1.626 m)   Wt 224 lb (101.6 kg)   BMI 38.45 kg/m    Subjective:    Patient ID: Valerie Baker, female    DOB: Dec 13, 1955, 61 y.o.   MRN: [id number]  HPI: Valerie Baker is a 61 y.o. female presenting on 06/24/2017 for Follow-up (6 month ) and Bursitis (right shoulder recheck )  Patient comes in for a 45-month recheck on her chronic conditions.  She has known degenerative disc disease, hypertension, osteopenia.  She also has a lot of shoulder pain.  All of her medications are reviewed today.  She had a very long weekend, her church had a large project and she is very sore after working on that.  Her medications seem to be working well at this time.  We will update her refills and plan to see her back in 3 months.  Relevant past medical, surgical, family and social history reviewed and updated as indicated. Allergies and medications reviewed and updated.  Past Medical History:  Diagnosis Date  . Adenomatous colon polyp 06/2006  . Allergy    SINUS  . Anxiety   . Anxiety and depression   . Arthritis   . Asthma   . Bladder incontinence   . Blood transfusion   . Cataract   . Depression   . Fibromyalgia   . GERD (gastroesophageal reflux disease)   . Hyperlipidemia   . Hypertension   . IBS (irritable bowel syndrome)   . Kidney stones   . Migraines   . Obesity   . Osteopenia     Past Surgical History:  Procedure Laterality Date  . ABDOMINAL HYSTERECTOMY    . APPENDECTOMY    . BACK SURGERY  2000  . DEBRIDEMENT TENNIS ELBOW    . ELBOW SURGERY  2003  . EYE SURGERY    . [place]   right  . LUMBAR FUSION  2007  . LUMBAR FUSION  2002,2003,2007,2008  . NASAL SINUS SURGERY  2003  . NISSEN FUNDOPLICATION  1660  . ROTATOR CUFF REPAIR  [id number]  . SPINAL CORD STIMULATOR IMPLANT  2005  . TOTAL KNEE ARTHROPLASTY  2008,2009    Review of Systems  Constitutional: Negative.  Negative  for activity change, fatigue and fever.  HENT: Negative.   Eyes: Negative.   Respiratory: Negative.  Negative for cough.   Cardiovascular: Negative.  Negative for chest pain.  Gastrointestinal: Negative.  Negative for abdominal pain.  Endocrine: Negative.   Genitourinary: Negative.  Negative for dysuria.  Musculoskeletal: Positive for arthralgias, back pain, gait problem and myalgias.  Skin: Negative.     Allergies as of 06/24/2017      Reactions   Sulfonamide Derivatives Rash      Medication List        Accurate as of 06/24/17  1:39 PM. Always use your most recent med list.          albuterol 108 (90 Base) MCG/ACT inhaler Commonly known as:  VENTOLIN HFA USE 2 PUFFS EVERY 6 HOURS AS NEEDED   baclofen 10 MG tablet Commonly known as:  LIORESAL Take 1 tablet by mouth 3 times a day as needed for muscle spasms   CALCIUM 600+D 600-400 MG-UNIT tablet Generic drug:  Calcium Carbonate-Vitamin D Take 1 tablet by mouth 2 (two) times daily.   citalopram 20 MG tablet Commonly known as:  CELEXA Take 1 tablet (20 mg total) by mouth daily.   diclofenac sodium 1 % Gel Commonly known as:  VOLTAREN APPLY 4 GRAMS 4 TIMES DAILY AS NEEDED   fluocinonide gel 0.05 % Commonly known as:  LIDEX Apply 2 (two) times daily topically.   fluticasone 50 MCG/ACT nasal spray Commonly known as:  FLONASE Place 2 sprays into both nostrils daily.   gabapentin 400 MG capsule Commonly known as:  NEURONTIN Take 2 capsules (800 mg total) by mouth 3 (three) times daily.   loratadine 10 MG tablet Commonly known as:  CLARITIN Take 10 mg by mouth daily as needed for allergies.   magic mouthwash w/lidocaine Soln Take 5 mLs by mouth 3 (three) times daily as needed for mouth pain.   meloxicam 15 MG tablet Commonly known as:  MOBIC Take 1 tablet (15 mg total) by mouth daily.   multivitamin tablet Take 1 tablet by mouth daily.   nystatin 100000 UNIT/ML suspension Commonly known as:   MYCOSTATIN TAKE ONE TEASPOONFUL THREE TIMES A DAY   omeprazole 40 MG capsule Commonly known as:  PRILOSEC Take 1 capsule by mouth twice a day   oxybutynin 5 MG tablet Commonly known as:  DITROPAN Take 1 tablet (5 mg total) by mouth 2 (two) times daily.   oxyCODONE-acetaminophen 5-325 MG tablet Commonly known as:  PERCOCET/ROXICET Take 1 tablet every 4 (four) hours as needed by mouth for severe pain.   oxyCODONE-acetaminophen 5-325 MG tablet Commonly known as:  ROXICET Take 1 tablet every 4 (four) hours as needed by mouth for severe pain.   oxyCODONE-acetaminophen 5-325 MG tablet Commonly known as:  ROXICET Take 1 tablet every 4 (four) hours as needed by mouth for severe pain.   traZODone 100 MG tablet Commonly known as:  DESYREL Take 1 tablet (100 mg total) by mouth at bedtime.   triamcinolone cream 0.1 % Commonly known as:  KENALOG Apply topically 2 (two) times daily.   Vitamin D3 2000 units Tabs Take 1 tablet by mouth daily.          Objective:    BP (!) 143/96   Pulse (!) 57   Temp (!) 97.4 F (36.3 C) (Oral)   Ht 5\' 4"  (1.626 m)   Wt 224 lb (101.6 kg)   BMI 38.45 kg/m   Allergies  Allergen Reactions  . Sulfonamide Derivatives Rash    Physical Exam  Constitutional: She is oriented to person, place, and time. She appears well-developed and well-nourished.  HENT:  Head: Normocephalic and atraumatic.  Eyes: Conjunctivae and EOM are normal. Pupils are equal, round, and reactive to light.  Cardiovascular: Normal rate, regular rhythm, normal heart sounds and intact distal pulses.  Pulmonary/Chest: Effort normal and breath sounds normal.  Abdominal: Soft. Bowel sounds are normal.  Musculoskeletal:       Lumbar back: She exhibits decreased range of motion, tenderness, pain and spasm.  Neurological: She is alert and oriented to person, place, and time. She has normal reflexes.  Skin: Skin is warm and dry. No rash noted.  Psychiatric: She has a normal mood  and affect. Her behavior is normal. Judgment and thought content normal.        Assessment & Plan:   1. Disc degeneration, lumbosacral - diclofenac sodium (VOLTAREN) 1 % GEL; APPLY 4 GRAMS 4 TIMES DAILY AS NEEDED  Dispense: 300 g; Refill: 5 - oxyCODONE-acetaminophen (PERCOCET/ROXICET) 5-325 MG tablet; Take 1 tablet every 4 (four) hours as needed by mouth for severe pain.  Dispense: 120 tablet; Refill: 0  2. Essential hypertension  3. Osteopenia, unspecified location  4. Need for immunization against influenza - Flu Vaccine QUAD 36+ mos IM    Current Outpatient Medications:  .  albuterol (VENTOLIN HFA) 108 (90 Base) MCG/ACT inhaler, USE 2 PUFFS EVERY 6 HOURS AS NEEDED, Disp: 18 g, Rfl: 6 .  baclofen (LIORESAL) 10 MG tablet, Take 1 tablet by mouth 3 times a day as needed for muscle spasms, Disp: 270 tablet, Rfl: 3 .  Calcium Carbonate-Vitamin D (CALCIUM 600+D) 600-400 MG-UNIT per tablet, Take 1 tablet by mouth 2 (two) times daily.  , Disp: , Rfl:  .  Cholecalciferol (VITAMIN D3) 2000 UNITS TABS, Take 1 tablet by mouth daily.  , Disp: , Rfl:  .  citalopram (CELEXA) 20 MG tablet, Take 1 tablet (20 mg total) by mouth daily., Disp: 90 tablet, Rfl: 3 .  diclofenac sodium (VOLTAREN) 1 % GEL, APPLY 4 GRAMS 4 TIMES DAILY AS NEEDED, Disp: 300 g, Rfl: 5 .  fluocinonide gel (LIDEX) 0.05 %, Apply 2 (two) times daily topically., Disp: 60 g, Rfl: 6 .  fluticasone (FLONASE) 50 MCG/ACT nasal spray, Place 2 sprays into both nostrils daily., Disp: 16 g, Rfl: 2 .  gabapentin (NEURONTIN) 400 MG capsule, Take 2 capsules (800 mg total) by mouth 3 (three) times daily., Disp: 540 capsule, Rfl: 3 .  loratadine (CLARITIN) 10 MG tablet, Take 10 mg by mouth daily as needed for allergies., Disp: , Rfl:  .  magic mouthwash w/lidocaine SOLN, Take 5 mLs by mouth 3 (three) times daily as needed for mouth pain., Disp: 240 mL, Rfl: 2 .  meloxicam (MOBIC) 15 MG tablet, Take 1 tablet (15 mg total) by mouth daily., Disp: 90  tablet, Rfl: 3 .  Multiple Vitamin (MULTIVITAMIN) tablet, Take 1 tablet by mouth daily. , Disp: , Rfl:  .  nystatin (MYCOSTATIN) 100000 UNIT/ML suspension, TAKE ONE TEASPOONFUL THREE TIMES A DAY, Disp: 120 mL, Rfl: 0 .  omeprazole (PRILOSEC) 40 MG capsule, Take 1 capsule by mouth twice a day, Disp: 180 capsule, Rfl: 0 .  oxybutynin (DITROPAN) 5 MG tablet, Take 1 tablet (5 mg total) by mouth 2 (two) times daily., Disp: 180 tablet, Rfl: 3 .  oxyCODONE-acetaminophen (PERCOCET/ROXICET) 5-325 MG tablet, Take 1 tablet every 4 (four) hours as needed by mouth for severe pain., Disp: 120 tablet, Rfl: 0 .  traZODone (DESYREL) 100 MG tablet, Take 1 tablet (100 mg total) by mouth at bedtime., Disp: 90 tablet, Rfl: 3 .  triamcinolone cream (KENALOG) 0.1 %, Apply topically 2 (two) times daily., Disp: 453.6 g, Rfl: 5 .  oxyCODONE-acetaminophen (ROXICET) 5-325 MG tablet, Take 1 tablet every 4 (four) hours as needed by mouth for severe pain., Disp: 120 tablet, Rfl: 0 .  oxyCODONE-acetaminophen (ROXICET) 5-325 MG tablet, Take 1 tablet every 4 (four) hours as needed by mouth for severe pain., Disp: 120 tablet, Rfl: 0 Continue all other maintenance medications as listed above.  Follow up plan: Return in about 3 months (around 09/24/2017) for recheck.  Educational handout given for [place] PA-C [place] [address]  [place], [place] [address] [phone]   06/24/2017, 1:39 PM

## 2017-06-24 NOTE — Patient Instructions (Signed)
In a few days you may receive a survey in the mail or online from [employer] regarding your visit with us today. Please take a moment to fill this out. Your feedback is very important to our whole office. It can help us better understand your needs as well as improve your experience and satisfaction. Thank you for taking your time to complete it. We care about you.  Shakura Cowing, PA-C  

## 2017-07-17 ENCOUNTER — Ambulatory Visit (INDEPENDENT_AMBULATORY_CARE_PROVIDER_SITE_OTHER): Payer: PPO | Admitting: *Deleted

## 2017-07-17 ENCOUNTER — Ambulatory Visit (INDEPENDENT_AMBULATORY_CARE_PROVIDER_SITE_OTHER): Payer: PPO

## 2017-07-17 VITALS — BP 121/77 | HR 52 | Ht 61.0 in | Wt 215.0 lb

## 2017-07-17 DIAGNOSIS — Z0000 Encounter for general adult medical examination without abnormal findings: Secondary | ICD-10-CM | POA: Diagnosis not present

## 2017-07-17 DIAGNOSIS — Z780 Asymptomatic menopausal state: Secondary | ICD-10-CM | POA: Diagnosis not present

## 2017-07-17 NOTE — Progress Notes (Addendum)
Subjective:   Valerie Baker is a 61 y.o. female who presents for Medicare Annual (Subsequent) preventive examination.  Valerie Baker went out of work on disability in 2002 due to back pain and has had several back surgeries.  She has 3 sons, 5 children and 2 great children.  She lives with her husband.  She enjoys reading and doing word search puzzles.  Valerie Baker attends church in [place], [place].  She feels her health is about the same as it was last year, and reports no surgeries, hospitalizations or emergency room visits in the past year.   Review of Systems:  Neck pain All other systems negative       Objective:     Vitals: BP 121/77   Pulse (!) 52   Ht 5\' 1"  (1.549 m)   Wt 215 lb (97.5 kg)   BMI 40.62 kg/m   Body mass index is 40.62 kg/m.  Advanced Directives 07/17/2017 09/28/2015 11/03/2014  Does Patient Have a Medical Advance Directive? Yes Yes No;Yes  Type of Paramedic of [place];Living will - [place];Living will  Copy of [place] in Chart? No - copy requested - No - copy requested    Tobacco Social History   Tobacco Use  Smoking Status Former Smoker  . Years: 5.00  . Last attempt to quit: 11/02/2005  . Years since quitting: 11.7  Smokeless Tobacco Never Used                        Past Medical History:  Diagnosis Date  . Adenomatous colon polyp 06/2006  . Allergy    SINUS  . Anxiety   . Anxiety and depression   . Arthritis   . Asthma   . Bladder incontinence   . Blood transfusion   . Cataract   . Depression   . Fibromyalgia   . GERD (gastroesophageal reflux disease)   . Hyperlipidemia   . Hypertension   . IBS (irritable bowel syndrome)   . Kidney stones   . Migraines   . Obesity   . Osteopenia    Past Surgical History:  Procedure Laterality Date  . ABDOMINAL HYSTERECTOMY    . APPENDECTOMY    . BACK SURGERY  2000  . DEBRIDEMENT TENNIS ELBOW    . ELBOW  SURGERY  2003  . EYE SURGERY    . [place]   right  . LUMBAR FUSION  2007  . LUMBAR FUSION  2002,2003,2007,2008  . NASAL SINUS SURGERY  2003  . NISSEN FUNDOPLICATION  6433  . ROTATOR CUFF REPAIR  [id number]  . SPINAL CORD STIMULATOR IMPLANT  2005  . TOTAL KNEE ARTHROPLASTY  2008,2009   Family History  Problem Relation Age of Onset  . Heart disease Mother   . Diabetes Mother   . Melanoma Mother        nose  . Diabetes Brother   . Cirrhosis Brother   . Hypertension Brother   . Stroke Father   . Hypertension Sister   . Hypertension Brother    Social History   Socioeconomic History  . Marital status: Married    Spouse name: None  . Number of children: 3  . Years of education: None  . Highest education level: None  Social Needs  . Financial resource strain: Not hard at all  . Food insecurity - worry: Never true  . Food insecurity - inability: Never true  .  Transportation needs - medical: No  . Transportation needs - non-medical: No  Occupational History  . Occupation: Disabled  Tobacco Use  . Smoking status: Former Smoker    Years: 5.00    Last attempt to quit: 11/02/2005    Years since quitting: 11.7  . Smokeless tobacco: Never Used  Substance and Sexual Activity  . Alcohol use: No  . Drug use: No  . Sexual activity: Yes  Other Topics Concern  . None  Social History Narrative  . None    Outpatient Encounter Medications as of 07/17/2017  Medication Sig  . albuterol (VENTOLIN HFA) 108 (90 Base) MCG/ACT inhaler USE 2 PUFFS EVERY 6 HOURS AS NEEDED  . baclofen (LIORESAL) 10 MG tablet Take 1 tablet by mouth 3 times a day as needed for muscle spasms  . Calcium Carbonate-Vitamin D (CALCIUM 600+D) 600-400 MG-UNIT per tablet Take 1 tablet by mouth 2 (two) times daily.    . Cholecalciferol (VITAMIN D3) 2000 UNITS TABS Take 1 tablet by mouth daily.    . citalopram (CELEXA) 20 MG tablet Take 1 tablet (20 mg total) by mouth daily.  . diclofenac sodium (VOLTAREN) 1  % GEL APPLY 4 GRAMS 4 TIMES DAILY AS NEEDED  . fluocinonide gel (LIDEX) 0.05 % Apply 2 (two) times daily topically.  . fluticasone (FLONASE) 50 MCG/ACT nasal spray Place 2 sprays into both nostrils daily.  Marland Kitchen gabapentin (NEURONTIN) 400 MG capsule Take 2 capsules (800 mg total) by mouth 3 (three) times daily.  Marland Kitchen loratadine (CLARITIN) 10 MG tablet Take 10 mg by mouth daily as needed for allergies.  . magic mouthwash w/lidocaine SOLN Take 5 mLs by mouth 3 (three) times daily as needed for mouth pain.  . meloxicam (MOBIC) 15 MG tablet Take 1 tablet (15 mg total) by mouth daily.  . Multiple Vitamin (MULTIVITAMIN) tablet Take 1 tablet by mouth daily.   Marland Kitchen nystatin (MYCOSTATIN) 100000 UNIT/ML suspension TAKE ONE TEASPOONFUL THREE TIMES A DAY  . omeprazole (PRILOSEC) 40 MG capsule Take 1 capsule by mouth twice a day  . oxybutynin (DITROPAN) 5 MG tablet Take 1 tablet (5 mg total) by mouth 2 (two) times daily.  Marland Kitchen oxyCODONE-acetaminophen (PERCOCET/ROXICET) 5-325 MG tablet Take 1 tablet every 4 (four) hours as needed by mouth for severe pain.  Marland Kitchen oxyCODONE-acetaminophen (ROXICET) 5-325 MG tablet Take 1 tablet every 4 (four) hours as needed by mouth for severe pain.  Marland Kitchen oxyCODONE-acetaminophen (ROXICET) 5-325 MG tablet Take 1 tablet every 4 (four) hours as needed by mouth for severe pain.  . traZODone (DESYREL) 100 MG tablet Take 1 tablet (100 mg total) by mouth at bedtime.  . triamcinolone cream (KENALOG) 0.1 % Apply topically 2 (two) times daily.   No facility-administered encounter medications on file as of 07/17/2017.     Activities of Daily Living In your present state of health, do you have any difficulty performing the following activities: 07/17/2017  Hearing? N  Vision? N  Difficulty concentrating or making decisions? Y  Comment Occassional problems remembering after reading something  Walking or climbing stairs? N  Dressing or bathing? N  Doing errands, shopping? N  Preparing Food and eating ? N    Using the Toilet? N  In the past six months, have you accidently leaked urine? Y  Comment Has urinary frequency, no leaking  Do you have problems with loss of bowel control? N  Managing your Medications? N  Managing your Finances? N  Housekeeping or managing your Housekeeping? N  Some recent  data might be hidden      Patient Care Team: Theodoro Clock as PCP - General (Physician Assistant) Okey Regal, [place] (Optometry) Starling Manns, MD (Orthopedic Surgery) Mcarthur Rossetti, MD as Consulting Physician (Orthopedic Surgery) Paralee Cancel, MD as Consulting Physician (Orthopedic Surgery)    Assessment:     Exercise Activities and Dietary recommendations Current Exercise Habits: The patient does not participate in regular exercise at present, Exercise limited by: orthopedic condition(s)  Goals    . Exercise 3x per week (30 min per time)     Increase exercise to 3 times per week for 30 minutes each session.  (stationary bike and water aerobics are great options.    . Increase physical activity    . Reduce calorie intake to 2000 calories per day      Increase non-starchy vegetables - carrots, green bean, squash, zucchini, tomatoes, onions, peppers, spinach and other green leafy vegetables, cabbage, lettuce, cucumbers, asparagus, okra (not fried), eggplant limit sugar and processed foods (cakes, cookies, ice cream, crackers and chips) Increase fresh fruit but limit serving sizes 1/2 cup or about the size of tennis or baseball No fruit juice or soda limit red meat to no more than 1-2 times per week (serving size about the size of your palm) Choose whole grains / lean proteins - whole wheat bread, quinoa, whole grain rice (1/2 cup), fish, chicken, [place]       Patient reports she is currently following at low carb diet, avoiding sweets, breads, and potatoes. She usually has 3 meals per day and snacks as needed     Fall Risk Fall Risk  07/17/2017 04/23/2017 11/29/2016  10/19/2016 03/05/2016  Falls in the past year? No No Yes Yes No  Number falls in past yr: - - 1 1 -  Injury with Fall? - - Yes Yes -  Follow up - - Falls prevention discussed - -     Depression Screen PHQ 2/9 Scores 07/17/2017 06/24/2017 04/23/2017 02/27/2017  PHQ - 2 Score 0 0 0 0     Cognitive Function MMSE - Mini Mental State Exam 07/17/2017 11/03/2014  Orientation to time 5 5  Orientation to Place 5 5  Registration 3 3  Attention/ Calculation 5 5  Recall 3 3  Language- name 2 objects 2 2  Language- repeat 1 1  Language- follow 3 step command 3 3  Language- read & follow direction 1 1  Write a sentence 1 1  Copy design 1 1  Total score 30 30        Immunization History  Administered Date(s) Administered  . Influenza,inj,Quad PF,6+ Mos 06/01/2015, 05/21/2016, 06/24/2017  . Influenza-Unspecified 05/13/2013, 05/26/2014  . Tdap 11/04/2014   Screening Tests Health Maintenance  Topic Date Due  . Hepatitis C Screening  Aug 30, 1955  . HIV Screening  04/22/1971  . PAP SMEAR  04/21/1977  . COLONOSCOPY  06/11/2016  . DEXA SCAN  11/02/2016  . MAMMOGRAM  05/17/2019  . TETANUS/TDAP  11/03/2024  . INFLUENZA VACCINE  Completed   Dexa scan done today Patient declined referral for colonoscopy today.  Planning to schedule breast biopsies this month per the recommendation of her mammogram in 05/2017.  Has had complete hysterectomy Recommend Hep C and HIV screenings in the future        Plan:      Increase exercise to 3 times per week for 30 minutes (stationary bike and water aerobics are great options)  Continue with healthy diet of mostly  vegetables, fruits, lean proteins, and whole grains  Bring copy of Advanced Directives to our office to be filed in medical record.  Make appointment with [name], PA in February for your 3 month follow up   I have personally reviewed and noted the following in the patient's chart:   . Medical and social history . Use of alcohol,  tobacco or illicit drugs  . Current medications and supplements . Functional ability and status . Nutritional status . Physical activity . Advanced directives . List of other physicians . Hospitalizations, surgeries, and ER visits in previous 12 months . Vitals . Screenings to include cognitive, depression, and falls . Referrals and appointments  In addition, I have reviewed and discussed with patient certain preventive protocols, quality metrics, and best practice recommendations. A written personalized care plan for preventive services as well as general preventive health recommendations were provided to patient.     WYATT, AMY M, RN  07/17/2017  I have reviewed and agree with the above AWV documentation.   Terald Sleeper PA-C [facility] [address]  [place],  [address] [phone]

## 2017-07-17 NOTE — Patient Instructions (Addendum)
Please work on increasing your exercise to 3 times per week for 30 minutes (stationary bike and water aerobics are great options)  Continue with your healthy diet of mostly vegetables, fruits, lean proteins, and whole grains  Please bring a copy of your Advanced Directives to our office to be filed in your chart.  Please see [name], PA in February for your 3 month follow up  It was so nice meeting you today, thank you for coming in for your Annual Wellness Visit!   Preventive Care 40-64 Years, Female Preventive care refers to lifestyle choices and visits with your health care provider that can promote health and wellness. What does preventive care include?  A yearly physical exam. This is also called an annual well check.  Dental exams once or twice a year.  Routine eye exams. Ask your health care provider how often you should have your eyes checked.  Personal lifestyle choices, including: ? Daily care of your teeth and gums. ? Regular physical activity. ? Eating a healthy diet. ? Avoiding tobacco and drug use. ? Limiting alcohol use. ? Practicing safe sex. ? Taking low-dose aspirin daily starting at age 31. ? Taking vitamin and mineral supplements as recommended by your health care provider. What happens during an annual well check? The services and screenings done by your health care provider during your annual well check will depend on your age, overall health, lifestyle risk factors, and family history of disease. Counseling Your health care provider may ask you questions about your:  Alcohol use.  Tobacco use.  Drug use.  Emotional well-being.  Home and relationship well-being.  Sexual activity.  Eating habits.  Work and work Statistician.  Method of birth control.  Menstrual cycle.  Pregnancy history.  Screening You may have the following tests or measurements:  Height, weight, and BMI.  Blood pressure.  Lipid and cholesterol levels. These may  be checked every 5 years, or more frequently if you are over 44 years old.  Skin check.  Lung cancer screening. You may have this screening every year starting at age 52 if you have a 30-pack-year history of smoking and currently smoke or have quit within the past 15 years.  Fecal occult blood test (FOBT) of the stool. You may have this test every year starting at age 42.  Flexible sigmoidoscopy or colonoscopy. You may have a sigmoidoscopy every 5 years or a colonoscopy every 10 years starting at age 70.  Hepatitis C blood test.  Hepatitis B blood test.  Sexually transmitted disease (STD) testing.  Diabetes screening. This is done by checking your blood sugar (glucose) after you have not eaten for a while (fasting). You may have this done every 1-3 years.  Mammogram. This may be done every 1-2 years. Talk to your health care provider about when you should start having regular mammograms. This may depend on whether you have a family history of breast cancer.  BRCA-related cancer screening. This may be done if you have a family history of breast, ovarian, tubal, or peritoneal cancers.  Pelvic exam and Pap test. This may be done every 3 years starting at age 60. Starting at age 78, this may be done every 5 years if you have a Pap test in combination with an HPV test.  Bone density scan. This is done to screen for osteoporosis. You may have this scan if you are at high risk for osteoporosis.  Discuss your test results, treatment options, and if necessary, the need  for more tests with your health care provider. Vaccines Your health care provider may recommend certain vaccines, such as:  Influenza vaccine. This is recommended every year.  Tetanus, diphtheria, and acellular pertussis (Tdap, Td) vaccine. You may need a Td booster every 10 years.  Varicella vaccine. You may need this if you have not been vaccinated.  Zoster vaccine. You may need this after age 44.  Measles, mumps, and  rubella (MMR) vaccine. You may need at least one dose of MMR if you were born in 1957 or later. You may also need a second dose.  Pneumococcal 13-valent conjugate (PCV13) vaccine. You may need this if you have certain conditions and were not previously vaccinated.  Pneumococcal polysaccharide (PPSV23) vaccine. You may need one or two doses if you smoke cigarettes or if you have certain conditions.  Meningococcal vaccine. You may need this if you have certain conditions.  Hepatitis A vaccine. You may need this if you have certain conditions or if you travel or work in places where you may be exposed to hepatitis A.  Hepatitis B vaccine. You may need this if you have certain conditions or if you travel or work in places where you may be exposed to hepatitis B.  Haemophilus influenzae type b (Hib) vaccine. You may need this if you have certain conditions.  Talk to your health care provider about which screenings and vaccines you need and how often you need them. This information is not intended to replace advice given to you by your health care provider. Make sure you discuss any questions you have with your health care provider. Document Released: 08/26/2015 Document Revised: 04/18/2016 Document Reviewed: 05/31/2015 Elsevier Interactive Patient Education  2017 [employer].

## 2017-07-26 DIAGNOSIS — M791 Myalgia, unspecified site: Secondary | ICD-10-CM | POA: Diagnosis not present

## 2017-07-26 DIAGNOSIS — M7551 Bursitis of right shoulder: Secondary | ICD-10-CM | POA: Diagnosis not present

## 2017-07-26 DIAGNOSIS — M542 Cervicalgia: Secondary | ICD-10-CM | POA: Diagnosis not present

## 2017-07-26 DIAGNOSIS — G894 Chronic pain syndrome: Secondary | ICD-10-CM | POA: Diagnosis not present

## 2017-07-30 DIAGNOSIS — M85852 Other specified disorders of bone density and structure, left thigh: Secondary | ICD-10-CM | POA: Diagnosis not present

## 2017-08-08 ENCOUNTER — Telehealth: Payer: Self-pay | Admitting: Physician Assistant

## 2017-08-08 MED ORDER — FLUOCINONIDE 0.05 % EX GEL: Freq: Two times a day (BID) | CUTANEOUS | 4 refills | Status: DC

## 2017-08-08 NOTE — Telephone Encounter (Signed)
Remaining refills sent to [facility] as requested by patient - patient aware.

## 2017-08-21 ENCOUNTER — Ambulatory Visit (INDEPENDENT_AMBULATORY_CARE_PROVIDER_SITE_OTHER): Payer: PPO | Admitting: Physician Assistant

## 2017-08-21 ENCOUNTER — Encounter: Payer: Self-pay | Admitting: Physician Assistant

## 2017-08-21 VITALS — BP 137/83 | HR 67 | Temp 98.1°F | Ht 61.0 in | Wt 224.0 lb

## 2017-08-21 DIAGNOSIS — M5412 Radiculopathy, cervical region: Secondary | ICD-10-CM

## 2017-08-21 MED ORDER — METHYLPREDNISOLONE ACETATE 80 MG/ML IJ SUSP
80.0000 mg | Freq: Once | INTRAMUSCULAR | Status: AC
  Administered 2017-08-21: 80 mg via INTRAMUSCULAR

## 2017-08-21 MED ORDER — PREDNISONE 10 MG (48) PO TBPK: ORAL_TABLET | ORAL | 0 refills | Status: DC

## 2017-08-21 NOTE — Progress Notes (Signed)
BP 137/83   Pulse 67   Temp 98.1 F (36.7 C) (Oral)   Ht '5\' 1"'$  (1.549 m)   Wt 224 lb (101.6 kg)   BMI 42.32 kg/m    Subjective:    Patient ID: Valerie Baker, female    DOB: 1955-11-09, 62 y.o.   MRN: [id number]  HPI: Valerie Baker is a 62 y.o. female presenting on 08/21/2017 for Neck Pain (Left, 3 month)  Patient with known degenerative disc disease and prior cervical surgery has had 3 months of severe cervical pain.  She saw her neurologist he injected it without any relief.  She continues with severe amount of pain in the left cervical area that radiates into the shoulder.  She has pain with lifting even small amounts of things.  Relevant past medical, surgical, family and social history reviewed and updated as indicated. Allergies and medications reviewed and updated.  Past Medical History:  Diagnosis Date  . Adenomatous colon polyp 06/2006  . Allergy    SINUS  . Anxiety   . Anxiety and depression   . Arthritis   . Asthma   . Bladder incontinence   . Blood transfusion   . Cataract   . Depression   . Fibromyalgia   . GERD (gastroesophageal reflux disease)   . Hyperlipidemia   . Hypertension   . IBS (irritable bowel syndrome)   . Kidney stones   . Migraines   . Obesity   . Osteopenia     Past Surgical History:  Procedure Laterality Date  . ABDOMINAL HYSTERECTOMY    . APPENDECTOMY    . BACK SURGERY  2000  . DEBRIDEMENT TENNIS ELBOW    . ELBOW SURGERY  2003  . EYE SURGERY    . [place]   right  . LUMBAR FUSION  2007  . LUMBAR FUSION  2002,2003,2007,2008  . NASAL SINUS SURGERY  2003  . NISSEN FUNDOPLICATION  7793  . ROTATOR CUFF REPAIR  [id number]  . SPINAL CORD STIMULATOR IMPLANT  2005  . TOTAL KNEE ARTHROPLASTY  2008,2009    Review of Systems  Constitutional: Negative.   HENT: Negative.   Eyes: Negative.   Respiratory: Negative.   Gastrointestinal: Negative.   Genitourinary: Negative.   Musculoskeletal: Positive for  arthralgias, back pain, myalgias, neck pain and neck stiffness.    Allergies as of 08/21/2017      Reactions   Sulfonamide Derivatives Rash      Medication List        Accurate as of 08/21/17  6:51 PM. Always use your most recent med list.          albuterol 108 (90 Base) MCG/ACT inhaler Commonly known as:  VENTOLIN HFA USE 2 PUFFS EVERY 6 HOURS AS NEEDED   baclofen 10 MG tablet Commonly known as:  LIORESAL Take 1 tablet by mouth 3 times a day as needed for muscle spasms   CALCIUM 600+D 600-400 MG-UNIT tablet Generic drug:  Calcium Carbonate-Vitamin D Take 1 tablet by mouth 2 (two) times daily.   citalopram 20 MG tablet Commonly known as:  CELEXA Take 1 tablet (20 mg total) by mouth daily.   diclofenac sodium 1 % Gel Commonly known as:  VOLTAREN APPLY 4 GRAMS 4 TIMES DAILY AS NEEDED   fluocinonide gel 0.05 % Commonly known as:  LIDEX Apply topically 2 (two) times daily.   fluticasone 50 MCG/ACT nasal spray Commonly known as:  FLONASE Place 2 sprays into both nostrils daily.  gabapentin 400 MG capsule Commonly known as:  NEURONTIN Take 2 capsules (800 mg total) by mouth 3 (three) times daily.   loratadine 10 MG tablet Commonly known as:  CLARITIN Take 10 mg by mouth daily as needed for allergies.   magic mouthwash w/lidocaine Soln Take 5 mLs by mouth 3 (three) times daily as needed for mouth pain.   meloxicam 15 MG tablet Commonly known as:  MOBIC Take 1 tablet (15 mg total) by mouth daily.   multivitamin tablet Take 1 tablet by mouth daily.   nystatin 100000 UNIT/ML suspension Commonly known as:  MYCOSTATIN TAKE ONE TEASPOONFUL THREE TIMES A DAY   omeprazole 40 MG capsule Commonly known as:  PRILOSEC Take 1 capsule by mouth twice a day   oxybutynin 5 MG tablet Commonly known as:  DITROPAN Take 1 tablet (5 mg total) by mouth 2 (two) times daily.   oxyCODONE-acetaminophen 5-325 MG tablet Commonly known as:  PERCOCET/ROXICET Take 1 tablet every 4  (four) hours as needed by mouth for severe pain.   oxyCODONE-acetaminophen 5-325 MG tablet Commonly known as:  ROXICET Take 1 tablet every 4 (four) hours as needed by mouth for severe pain.   oxyCODONE-acetaminophen 5-325 MG tablet Commonly known as:  ROXICET Take 1 tablet every 4 (four) hours as needed by mouth for severe pain.   predniSONE 10 MG (48) Tbpk tablet Commonly known as:  STERAPRED UNI-PAK 48 TAB Take 12 days as directed   traZODone 100 MG tablet Commonly known as:  DESYREL Take 1 tablet (100 mg total) by mouth at bedtime.   triamcinolone cream 0.1 % Commonly known as:  KENALOG Apply topically 2 (two) times daily.   Vitamin D3 2000 units Tabs Take 1 tablet by mouth daily.          Objective:    BP 137/83   Pulse 67   Temp 98.1 F (36.7 C) (Oral)   Ht '5\' 1"'$  (1.549 m)   Wt 224 lb (101.6 kg)   BMI 42.32 kg/m   Allergies  Allergen Reactions  . Sulfonamide Derivatives Rash    Physical Exam  Constitutional: She appears well-developed and well-nourished.  HENT:  Head: Normocephalic and atraumatic.  Eyes: Conjunctivae are normal. Pupils are equal, round, and reactive to light.  Cardiovascular: Normal rate and regular rhythm.  Pulmonary/Chest: Effort normal and breath sounds normal.  Musculoskeletal:       Cervical back: She exhibits decreased range of motion, tenderness, pain and spasm. She exhibits no edema and no deformity.       Back:  Nursing note and vitals reviewed.   Results for orders placed or performed in visit on 11/28/16  CBC with Differential/Platelet  Result Value Ref Range   WBC 5.0 3.4 - 10.8 x10E3/uL   RBC 4.07 3.77 - 5.28 x10E6/uL   Hemoglobin 12.4 11.1 - 15.9 g/dL   Hematocrit 37.4 34.0 - 46.6 %   MCV 92 79 - 97 fL   MCH 30.5 26.6 - 33.0 pg   MCHC 33.2 31.5 - 35.7 g/dL   RDW 14.0 12.3 - 15.4 %   Platelets 201 150 - 379 x10E3/uL   Neutrophils 46 Not Estab. %   Lymphs 39 Not Estab. %   Monocytes 8 Not Estab. %   Eos 6 Not  Estab. %   Basos 1 Not Estab. %   Neutrophils Absolute 2.3 1.4 - 7.0 x10E3/uL   Lymphocytes Absolute 1.9 0.7 - 3.1 x10E3/uL   Monocytes Absolute 0.4 0.1 - 0.9 x10E3/uL  EOS (ABSOLUTE) 0.3 0.0 - 0.4 x10E3/uL   Basophils Absolute 0.0 0.0 - 0.2 x10E3/uL   Immature Granulocytes 0 Not Estab. %   Immature Grans (Abs) 0.0 0.0 - 0.1 x10E3/uL  CMP14+EGFR  Result Value Ref Range   Glucose 85 65 - 99 mg/dL   BUN 22 8 - 27 mg/dL   Creatinine, Ser 0.72 0.57 - 1.00 mg/dL   GFR calc non Af Amer 91 >59 mL/min/1.73   GFR calc Af Amer 105 >59 mL/min/1.73   BUN/Creatinine Ratio 31 (H) 12 - 28   Sodium 140 134 - 144 mmol/L   Potassium 4.6 3.5 - 5.2 mmol/L   Chloride 100 96 - 106 mmol/L   CO2 26 18 - 29 mmol/L   Calcium 9.7 8.7 - 10.3 mg/dL   Total Protein 6.4 6.0 - 8.5 g/dL   Albumin 4.0 3.6 - 4.8 g/dL   Globulin, Total 2.4 1.5 - 4.5 g/dL   Albumin/Globulin Ratio 1.7 1.2 - 2.2   Bilirubin Total 0.3 0.0 - 1.2 mg/dL   Alkaline Phosphatase 85 39 - 117 IU/L   AST 23 0 - 40 IU/L   ALT 19 0 - 32 IU/L  Lipid panel  Result Value Ref Range   Cholesterol, Total 227 (H) 100 - 199 mg/dL   Triglycerides 111 0 - 149 mg/dL   HDL 80 >39 mg/dL   VLDL Cholesterol Cal 22 5 - 40 mg/dL   LDL Calculated 125 (H) 0 - 99 mg/dL   Chol/HDL Ratio 2.8 0.0 - 4.4 ratio  TSH  Result Value Ref Range   TSH 3.810 0.450 - 4.500 uIU/mL      Assessment & Plan:   1. Cervical radiculopathy - methylPREDNISolone acetate (DEPO-MEDROL) injection 80 mg - predniSONE (STERAPRED UNI-PAK 48 TAB) 10 MG (48) TBPK tablet; Take 12 days as directed  Dispense: 48 tablet; Refill: 0    Current Outpatient Medications:  .  albuterol (VENTOLIN HFA) 108 (90 Base) MCG/ACT inhaler, USE 2 PUFFS EVERY 6 HOURS AS NEEDED, Disp: 18 g, Rfl: 6 .  baclofen (LIORESAL) 10 MG tablet, Take 1 tablet by mouth 3 times a day as needed for muscle spasms, Disp: 270 tablet, Rfl: 3 .  Calcium Carbonate-Vitamin D (CALCIUM 600+D) 600-400 MG-UNIT per tablet, Take 1  tablet by mouth 2 (two) times daily.  , Disp: , Rfl:  .  Cholecalciferol (VITAMIN D3) 2000 UNITS TABS, Take 1 tablet by mouth daily.  , Disp: , Rfl:  .  citalopram (CELEXA) 20 MG tablet, Take 1 tablet (20 mg total) by mouth daily., Disp: 90 tablet, Rfl: 3 .  diclofenac sodium (VOLTAREN) 1 % GEL, APPLY 4 GRAMS 4 TIMES DAILY AS NEEDED, Disp: 300 g, Rfl: 5 .  fluocinonide gel (LIDEX) 0.05 %, Apply topically 2 (two) times daily., Disp: 60 g, Rfl: 4 .  fluticasone (FLONASE) 50 MCG/ACT nasal spray, Place 2 sprays into both nostrils daily., Disp: 16 g, Rfl: 2 .  gabapentin (NEURONTIN) 400 MG capsule, Take 2 capsules (800 mg total) by mouth 3 (three) times daily., Disp: 540 capsule, Rfl: 3 .  loratadine (CLARITIN) 10 MG tablet, Take 10 mg by mouth daily as needed for allergies., Disp: , Rfl:  .  magic mouthwash w/lidocaine SOLN, Take 5 mLs by mouth 3 (three) times daily as needed for mouth pain., Disp: 240 mL, Rfl: 2 .  meloxicam (MOBIC) 15 MG tablet, Take 1 tablet (15 mg total) by mouth daily., Disp: 90 tablet, Rfl: 3 .  Multiple Vitamin (MULTIVITAMIN) tablet,  Take 1 tablet by mouth daily. , Disp: , Rfl:  .  nystatin (MYCOSTATIN) 100000 UNIT/ML suspension, TAKE ONE TEASPOONFUL THREE TIMES A DAY, Disp: 120 mL, Rfl: 0 .  omeprazole (PRILOSEC) 40 MG capsule, Take 1 capsule by mouth twice a day, Disp: 180 capsule, Rfl: 0 .  oxybutynin (DITROPAN) 5 MG tablet, Take 1 tablet (5 mg total) by mouth 2 (two) times daily., Disp: 180 tablet, Rfl: 3 .  oxyCODONE-acetaminophen (PERCOCET/ROXICET) 5-325 MG tablet, Take 1 tablet every 4 (four) hours as needed by mouth for severe pain., Disp: 120 tablet, Rfl: 0 .  oxyCODONE-acetaminophen (ROXICET) 5-325 MG tablet, Take 1 tablet every 4 (four) hours as needed by mouth for severe pain., Disp: 120 tablet, Rfl: 0 .  oxyCODONE-acetaminophen (ROXICET) 5-325 MG tablet, Take 1 tablet every 4 (four) hours as needed by mouth for severe pain., Disp: 120 tablet, Rfl: 0 .  predniSONE  (STERAPRED UNI-PAK 48 TAB) 10 MG (48) TBPK tablet, Take 12 days as directed, Disp: 48 tablet, Rfl: 0 .  traZODone (DESYREL) 100 MG tablet, Take 1 tablet (100 mg total) by mouth at bedtime., Disp: 90 tablet, Rfl: 3 .  triamcinolone cream (KENALOG) 0.1 %, Apply topically 2 (two) times daily., Disp: 453.6 g, Rfl: 5 Continue all other maintenance medications as listed above.  Follow up plan: Return if symptoms worsen or fail to improve.  Educational handout given for [place] PA-C [place] [address]  [place], [place] [address] [phone]   08/21/2017, 6:51 PM

## 2017-08-21 NOTE — Patient Instructions (Signed)
In a few days you may receive a survey in the mail or online from [employer] regarding your visit with us today. Please take a moment to fill this out. Your feedback is very important to our whole office. It can help us better understand your needs as well as improve your experience and satisfaction. Thank you for taking your time to complete it. We care about you.  Kristyl Athens, PA-C  

## 2017-09-19 ENCOUNTER — Ambulatory Visit (INDEPENDENT_AMBULATORY_CARE_PROVIDER_SITE_OTHER): Payer: PPO | Admitting: Family

## 2017-09-19 ENCOUNTER — Encounter: Payer: Self-pay | Admitting: Family

## 2017-09-19 VITALS — BP 133/61 | HR 60 | Temp 98.6°F | Ht 61.0 in | Wt 234.0 lb

## 2017-09-19 DIAGNOSIS — R6889 Other general symptoms and signs: Secondary | ICD-10-CM

## 2017-09-19 DIAGNOSIS — J069 Acute upper respiratory infection, unspecified: Secondary | ICD-10-CM | POA: Diagnosis not present

## 2017-09-19 LAB — VERITOR FLU A/B WAIVED
Influenza A: NEGATIVE
Influenza B: NEGATIVE

## 2017-09-19 MED ORDER — BENZONATATE 200 MG PO CAPS: 200.0000 mg | ORAL_CAPSULE | Freq: Three times a day (TID) | ORAL | 1 refills | Status: DC | PRN

## 2017-09-19 MED ORDER — HYDROCODONE-HOMATROPINE 5-1.5 MG/5ML PO SYRP: 5.0000 mL | ORAL_SOLUTION | Freq: Three times a day (TID) | ORAL | 0 refills | Status: DC | PRN

## 2017-09-19 MED ORDER — FLUTICASONE PROPIONATE 50 MCG/ACT NA SUSP: 2.0000 | Freq: Every day | NASAL | 2 refills | Status: DC

## 2017-09-19 NOTE — Progress Notes (Signed)
   Subjective:    Patient ID: Valerie Baker, female    DOB: 06-Jan-1956, 62 y.o.   MRN: [id number]  Cough  This is a new problem. The current episode started in the past 7 days. The problem has been waxing and waning. The problem occurs every few minutes. The cough is non-productive. Associated symptoms include ear pain and headaches. Pertinent negatives include no chills, fever, myalgias or sore throat.      Review of Systems  Constitutional: Negative for chills and fever.  HENT: Positive for ear pain. Negative for sore throat.   Respiratory: Positive for cough.   Musculoskeletal: Negative for myalgias.  Neurological: Positive for headaches.  All other systems reviewed and are negative.      Objective:   Physical Exam  Constitutional: She is oriented to person, place, and time. She appears well-developed and well-nourished. No distress.  HENT:  Head: Normocephalic and atraumatic.  Right Ear: External ear normal.  Left Ear: External ear normal.  Nose: Mucosal edema and rhinorrhea present. Right sinus exhibits maxillary sinus tenderness. Left sinus exhibits maxillary sinus tenderness.  Mouth/Throat: Posterior oropharyngeal erythema present.  Eyes: Pupils are equal, round, and reactive to light.  Neck: Normal range of motion. Neck supple. No thyromegaly present.  Cardiovascular: Normal rate, regular rhythm, normal heart sounds and intact distal pulses.  No murmur heard. Pulmonary/Chest: Effort normal and breath sounds normal. No respiratory distress. She has no wheezes.  Intermittent nonproductive cough   Abdominal: Soft. Bowel sounds are normal. She exhibits no distension. There is no tenderness.  Musculoskeletal: Normal range of motion. She exhibits no edema or tenderness.  Neurological: She is alert and oriented to person, place, and time. She has normal reflexes. No cranial nerve deficit.  Skin: Skin is warm and dry.  Psychiatric: She has a normal mood and affect. Her  behavior is normal. Judgment and thought content normal.  Vitals reviewed.     BP 133/61   Pulse 60   Temp 98.6 F (37 C) (Oral)   Ht 5\' 1"  (1.549 m)   Wt 234 lb (106.1 kg)   BMI 44.21 kg/m      Assessment & Plan:  1. Flu-like symptoms - Veritor Flu A/B Waived  2. Viral upper respiratory tract infection - Take meds as prescribed - Use a cool mist humidifier  -Use saline nose sprays frequently -Force fluids -For any cough or congestion  Use plain Mucinex- regular strength or max strength is fine -For fever or aces or pains- take tylenol or ibuprofen appropriate for age and weight. -Throat lozenges if help - fluticasone (FLONASE) 50 MCG/ACT nasal spray; Place 2 sprays into both nostrils daily.  Dispense: 16 g; Refill: 2 - benzonatate (TESSALON) 200 MG capsule; Take 1 capsule (200 mg total) by mouth 3 (three) times daily as needed.  Dispense: 30 capsule; Refill: [place], FNP

## 2017-09-19 NOTE — Patient Instructions (Signed)
Upper Respiratory Infection, Adult Most upper respiratory infections (URIs) are caused by a virus. A URI affects the nose, throat, and upper air passages. The most common type of URI is often called "the common cold." Follow these instructions at home:  Take medicines only as told by your doctor.  Gargle warm saltwater or take cough drops to comfort your throat as told by your doctor.  Use a warm mist humidifier or inhale steam from a shower to increase air moisture. This may make it easier to breathe.  Drink enough fluid to keep your pee (urine) clear or pale yellow.  Eat soups and other clear broths.  Have a healthy diet.  Rest as needed.  Go back to work when your fever is gone or your doctor says it is okay. ? You may need to stay home longer to avoid giving your URI to others. ? You can also wear a face mask and wash your hands often to prevent spread of the virus.  Use your inhaler more if you have asthma.  Do not use any tobacco products, including cigarettes, chewing tobacco, or electronic cigarettes. If you need help quitting, ask your doctor. Contact a doctor if:  You are getting worse, not better.  Your symptoms are not helped by medicine.  You have chills.  You are getting more short of breath.  You have brown or red mucus.  You have yellow or brown discharge from your nose.  You have pain in your face, especially when you bend forward.  You have a fever.  You have puffy (swollen) neck glands.  You have pain while swallowing.  You have white areas in the back of your throat. Get help right away if:  You have very bad or constant: ? Headache. ? Ear pain. ? Pain in your forehead, behind your eyes, and over your cheekbones (sinus pain). ? Chest pain.  You have long-lasting (chronic) lung disease and any of the following: ? Wheezing. ? Long-lasting cough. ? Coughing up blood. ? A change in your usual mucus.  You have a stiff neck.  You have  changes in your: ? Vision. ? Hearing. ? Thinking. ? Mood. This information is not intended to replace advice given to you by your health care provider. Make sure you discuss any questions you have with your health care provider. Document Released: 01/16/2008 Document Revised: 04/01/2016 Document Reviewed: 11/04/2013 Elsevier Interactive Patient Education  2018 [employer]  

## 2017-09-23 ENCOUNTER — Ambulatory Visit (INDEPENDENT_AMBULATORY_CARE_PROVIDER_SITE_OTHER): Payer: PPO | Admitting: Physician Assistant

## 2017-09-23 ENCOUNTER — Encounter: Payer: Self-pay | Admitting: Physician Assistant

## 2017-09-23 VITALS — BP 150/71 | HR 51 | Temp 97.1°F | Ht 61.0 in | Wt 237.6 lb

## 2017-09-23 DIAGNOSIS — R059 Cough, unspecified: Secondary | ICD-10-CM

## 2017-09-23 DIAGNOSIS — J40 Bronchitis, not specified as acute or chronic: Secondary | ICD-10-CM | POA: Diagnosis not present

## 2017-09-23 DIAGNOSIS — R05 Cough: Secondary | ICD-10-CM

## 2017-09-23 MED ORDER — ALBUTEROL SULFATE HFA 108 (90 BASE) MCG/ACT IN AERS
2.0000 | INHALATION_SPRAY | Freq: Four times a day (QID) | RESPIRATORY_TRACT | 0 refills | Status: DC | PRN
Start: 2017-09-23 — End: 2018-08-08

## 2017-09-23 MED ORDER — METHYLPREDNISOLONE ACETATE 80 MG/ML IJ SUSP
80.0000 mg | Freq: Once | INTRAMUSCULAR | Status: AC
  Administered 2017-09-23: 80 mg via INTRAMUSCULAR

## 2017-09-23 MED ORDER — AMOXICILLIN-POT CLAVULANATE 875-125 MG PO TABS: 1.0000 | ORAL_TABLET | Freq: Two times a day (BID) | ORAL | 0 refills | Status: DC

## 2017-09-23 NOTE — Patient Instructions (Signed)
In a few days you may receive a survey in the mail or online from [employer] regarding your visit with us today. Please take a moment to fill this out. Your feedback is very important to our whole office. It can help us better understand your needs as well as improve your experience and satisfaction. Thank you for taking your time to complete it. We care about you.  Porshia Blizzard, PA-C  

## 2017-09-23 NOTE — Progress Notes (Signed)
BP (!) 150/71   Pulse (!) 51   Temp (!) 97.1 F (36.2 C) (Oral)   Ht 5\' 1"  (1.549 m)   Wt 237 lb 9.6 oz (107.8 kg)   BMI 44.89 kg/m    Subjective:    Patient ID: Valerie Baker, female    DOB: 1955-11-10, 62 y.o.   MRN: [id number]  HPI: Valerie Baker is a 62 y.o. female presenting on 09/23/2017 for Headache and Ear Pain  Patient with several days of progressing upper respiratory and bronchial symptoms. Initially there was more upper respiratory congestion. This progressed to having significant cough that is productive throughout the day and severe at night. There is occasional wheezing after coughing. Sometimes there is slight dyspnea on exertion. It is productive mucus that is yellow in color. Denies any blood.   Relevant past medical, surgical, family and social history reviewed and updated as indicated. Allergies and medications reviewed and updated.  Past Medical History:  Diagnosis Date  . Adenomatous colon polyp 06/2006  . Allergy    SINUS  . Anxiety   . Anxiety and depression   . Arthritis   . Asthma   . Bladder incontinence   . Blood transfusion   . Cataract   . Depression   . Fibromyalgia   . GERD (gastroesophageal reflux disease)   . Hyperlipidemia   . Hypertension   . IBS (irritable bowel syndrome)   . Kidney stones   . Migraines   . Obesity   . Osteopenia     Past Surgical History:  Procedure Laterality Date  . ABDOMINAL HYSTERECTOMY    . APPENDECTOMY    . BACK SURGERY  2000  . DEBRIDEMENT TENNIS ELBOW    . ELBOW SURGERY  2003  . EYE SURGERY    . [place]   right  . LUMBAR FUSION  2007  . LUMBAR FUSION  2002,2003,2007,2008  . NASAL SINUS SURGERY  2003  . NISSEN FUNDOPLICATION  1740  . ROTATOR CUFF REPAIR  [id number]  . SPINAL CORD STIMULATOR IMPLANT  2005  . TOTAL KNEE ARTHROPLASTY  2008,2009    Review of Systems  Constitutional: Positive for chills and fatigue. Negative for activity change, appetite change and fever.    HENT: Positive for congestion, postnasal drip and sore throat.   Eyes: Negative.   Respiratory: Positive for cough, shortness of breath and wheezing.   Cardiovascular: Negative.  Negative for chest pain, palpitations and leg swelling.  Gastrointestinal: Negative.   Genitourinary: Negative.   Musculoskeletal: Negative.   Skin: Negative.   Neurological: Positive for headaches.    Allergies as of 09/23/2017      Reactions   Sulfonamide Derivatives Rash      Medication List        Accurate as of 09/23/17 11:45 AM. Always use your most recent med list.          albuterol 108 (90 Base) MCG/ACT inhaler Commonly known as:  VENTOLIN HFA USE 2 PUFFS EVERY 6 HOURS AS NEEDED   albuterol 108 (90 Base) MCG/ACT inhaler Commonly known as:  PROVENTIL HFA;VENTOLIN HFA Inhale 2 puffs into the lungs every 6 (six) hours as needed for wheezing or shortness of breath.   amoxicillin-clavulanate 875-125 MG tablet Commonly known as:  AUGMENTIN Take 1 tablet by mouth 2 (two) times daily.   baclofen 10 MG tablet Commonly known as:  LIORESAL Take 1 tablet by mouth 3 times a day as needed for muscle spasms   benzonatate  200 MG capsule Commonly known as:  TESSALON Take 1 capsule (200 mg total) by mouth 3 (three) times daily as needed.   CALCIUM 600+D 600-400 MG-UNIT tablet Generic drug:  Calcium Carbonate-Vitamin D Take 1 tablet by mouth 2 (two) times daily.   citalopram 20 MG tablet Commonly known as:  CELEXA Take 1 tablet (20 mg total) by mouth daily.   diclofenac sodium 1 % Gel Commonly known as:  VOLTAREN APPLY 4 GRAMS 4 TIMES DAILY AS NEEDED   fluocinonide gel 0.05 % Commonly known as:  LIDEX Apply topically 2 (two) times daily.   fluticasone 50 MCG/ACT nasal spray Commonly known as:  FLONASE Place 2 sprays into both nostrils daily.   gabapentin 400 MG capsule Commonly known as:  NEURONTIN Take 2 capsules (800 mg total) by mouth 3 (three) times daily.    HYDROcodone-homatropine 5-1.5 MG/5ML syrup Commonly known as:  HYCODAN Take 5 mLs by mouth every 8 (eight) hours as needed for cough.   loratadine 10 MG tablet Commonly known as:  CLARITIN Take 10 mg by mouth daily as needed for allergies.   meloxicam 15 MG tablet Commonly known as:  MOBIC Take 1 tablet (15 mg total) by mouth daily.   multivitamin tablet Take 1 tablet by mouth daily.   omeprazole 40 MG capsule Commonly known as:  PRILOSEC Take 1 capsule by mouth twice a day   oxybutynin 5 MG tablet Commonly known as:  DITROPAN Take 1 tablet (5 mg total) by mouth 2 (two) times daily.   oxyCODONE-acetaminophen 5-325 MG tablet Commonly known as:  PERCOCET/ROXICET Take 1 tablet every 4 (four) hours as needed by mouth for severe pain.   oxyCODONE-acetaminophen 5-325 MG tablet Commonly known as:  ROXICET Take 1 tablet every 4 (four) hours as needed by mouth for severe pain.   oxyCODONE-acetaminophen 5-325 MG tablet Commonly known as:  ROXICET Take 1 tablet every 4 (four) hours as needed by mouth for severe pain.   traZODone 100 MG tablet Commonly known as:  DESYREL Take 1 tablet (100 mg total) by mouth at bedtime.   triamcinolone cream 0.1 % Commonly known as:  KENALOG Apply topically 2 (two) times daily.   Vitamin D3 2000 units Tabs Take 1 tablet by mouth daily.          Objective:    BP (!) 150/71   Pulse (!) 51   Temp (!) 97.1 F (36.2 C) (Oral)   Ht 5\' 1"  (1.549 m)   Wt 237 lb 9.6 oz (107.8 kg)   BMI 44.89 kg/m   Allergies  Allergen Reactions  . Sulfonamide Derivatives Rash    Physical Exam  Constitutional: She is oriented to person, place, and time. She appears well-developed and well-nourished.  HENT:  Head: Normocephalic and atraumatic.  Right Ear: There is drainage and tenderness.  Left Ear: There is drainage and tenderness.  Nose: Mucosal edema and rhinorrhea present. Right sinus exhibits no maxillary sinus tenderness and no frontal sinus  tenderness. Left sinus exhibits no maxillary sinus tenderness and no frontal sinus tenderness.  Mouth/Throat: Oropharyngeal exudate and posterior oropharyngeal erythema present.  Eyes: Conjunctivae and EOM are normal. Pupils are equal, round, and reactive to light.  Neck: Normal range of motion. Neck supple.  Cardiovascular: Normal rate, regular rhythm, normal heart sounds and intact distal pulses.  Pulmonary/Chest: Effort normal. She has wheezes in the right upper field and the left upper field.  Abdominal: Soft. Bowel sounds are normal.  Neurological: She is alert and oriented  to person, place, and time. She has normal reflexes.  Skin: Skin is warm and dry. No rash noted.  Psychiatric: She has a normal mood and affect. Her behavior is normal. Judgment and thought content normal.  Nursing note and vitals reviewed.   Results for orders placed or performed in visit on 09/19/17  Veritor Flu A/B Waived  Result Value Ref Range   Influenza A Negative Negative   Influenza B Negative Negative      Assessment & Plan:   1. Bronchitis - amoxicillin-clavulanate (AUGMENTIN) 875-125 MG tablet; Take 1 tablet by mouth 2 (two) times daily.  Dispense: 20 tablet; Refill: 0 - albuterol (PROVENTIL HFA;VENTOLIN HFA) 108 (90 Base) MCG/ACT inhaler; Inhale 2 puffs into the lungs every 6 (six) hours as needed for wheezing or shortness of breath.  Dispense: 1 Inhaler; Refill: 0 - methylPREDNISolone acetate (DEPO-MEDROL) injection 80 mg  2. Cough - albuterol (PROVENTIL HFA;VENTOLIN HFA) 108 (90 Base) MCG/ACT inhaler; Inhale 2 puffs into the lungs every 6 (six) hours as needed for wheezing or shortness of breath.  Dispense: 1 Inhaler; Refill: 0   Current Outpatient Medications:  .  albuterol (VENTOLIN HFA) 108 (90 Base) MCG/ACT inhaler, USE 2 PUFFS EVERY 6 HOURS AS NEEDED, Disp: 18 g, Rfl: 6 .  baclofen (LIORESAL) 10 MG tablet, Take 1 tablet by mouth 3 times a day as needed for muscle spasms, Disp: 270 tablet,  Rfl: 3 .  Calcium Carbonate-Vitamin D (CALCIUM 600+D) 600-400 MG-UNIT per tablet, Take 1 tablet by mouth 2 (two) times daily.  , Disp: , Rfl:  .  Cholecalciferol (VITAMIN D3) 2000 UNITS TABS, Take 1 tablet by mouth daily.  , Disp: , Rfl:  .  citalopram (CELEXA) 20 MG tablet, Take 1 tablet (20 mg total) by mouth daily., Disp: 90 tablet, Rfl: 3 .  diclofenac sodium (VOLTAREN) 1 % GEL, APPLY 4 GRAMS 4 TIMES DAILY AS NEEDED, Disp: 300 g, Rfl: 5 .  fluocinonide gel (LIDEX) 0.05 %, Apply topically 2 (two) times daily., Disp: 60 g, Rfl: 4 .  fluticasone (FLONASE) 50 MCG/ACT nasal spray, Place 2 sprays into both nostrils daily., Disp: 16 g, Rfl: 2 .  gabapentin (NEURONTIN) 400 MG capsule, Take 2 capsules (800 mg total) by mouth 3 (three) times daily., Disp: 540 capsule, Rfl: 3 .  loratadine (CLARITIN) 10 MG tablet, Take 10 mg by mouth daily as needed for allergies., Disp: , Rfl:  .  meloxicam (MOBIC) 15 MG tablet, Take 1 tablet (15 mg total) by mouth daily., Disp: 90 tablet, Rfl: 3 .  Multiple Vitamin (MULTIVITAMIN) tablet, Take 1 tablet by mouth daily. , Disp: , Rfl:  .  omeprazole (PRILOSEC) 40 MG capsule, Take 1 capsule by mouth twice a day, Disp: 180 capsule, Rfl: 0 .  oxybutynin (DITROPAN) 5 MG tablet, Take 1 tablet (5 mg total) by mouth 2 (two) times daily., Disp: 180 tablet, Rfl: 3 .  oxyCODONE-acetaminophen (PERCOCET/ROXICET) 5-325 MG tablet, Take 1 tablet every 4 (four) hours as needed by mouth for severe pain., Disp: 120 tablet, Rfl: 0 .  oxyCODONE-acetaminophen (ROXICET) 5-325 MG tablet, Take 1 tablet every 4 (four) hours as needed by mouth for severe pain., Disp: 120 tablet, Rfl: 0 .  oxyCODONE-acetaminophen (ROXICET) 5-325 MG tablet, Take 1 tablet every 4 (four) hours as needed by mouth for severe pain., Disp: 120 tablet, Rfl: 0 .  traZODone (DESYREL) 100 MG tablet, Take 1 tablet (100 mg total) by mouth at bedtime., Disp: 90 tablet, Rfl: 3 .  triamcinolone  cream (KENALOG) 0.1 %, Apply topically  2 (two) times daily., Disp: 453.6 g, Rfl: 5 .  albuterol (PROVENTIL HFA;VENTOLIN HFA) 108 (90 Base) MCG/ACT inhaler, Inhale 2 puffs into the lungs every 6 (six) hours as needed for wheezing or shortness of breath., Disp: 1 Inhaler, Rfl: 0 .  amoxicillin-clavulanate (AUGMENTIN) 875-125 MG tablet, Take 1 tablet by mouth 2 (two) times daily., Disp: 20 tablet, Rfl: 0 .  benzonatate (TESSALON) 200 MG capsule, Take 1 capsule (200 mg total) by mouth 3 (three) times daily as needed. (Patient not taking: Reported on 09/23/2017), Disp: 30 capsule, Rfl: 1 .  HYDROcodone-homatropine (HYCODAN) 5-1.5 MG/5ML syrup, Take 5 mLs by mouth every 8 (eight) hours as needed for cough. (Patient not taking: Reported on 09/23/2017), Disp: 120 mL, Rfl: 0 Continue all other maintenance medications as listed above.  Follow up plan: Return if symptoms worsen or fail to improve.  Educational handout given for [place] PA-C [facility] [address]  [place], [place] [address] [phone]   09/23/2017, 11:45 AM

## 2017-10-18 ENCOUNTER — Ambulatory Visit: Payer: PPO | Admitting: Nurse Practitioner

## 2017-10-25 ENCOUNTER — Telehealth: Payer: Self-pay | Admitting: Physician Assistant

## 2017-10-25 ENCOUNTER — Encounter: Payer: Self-pay | Admitting: Physician Assistant

## 2017-10-25 ENCOUNTER — Ambulatory Visit (INDEPENDENT_AMBULATORY_CARE_PROVIDER_SITE_OTHER): Payer: PPO | Admitting: Physician Assistant

## 2017-10-25 VITALS — BP 131/79 | HR 53 | Temp 97.5°F | Ht 61.0 in | Wt 229.0 lb

## 2017-10-25 DIAGNOSIS — Z0000 Encounter for general adult medical examination without abnormal findings: Secondary | ICD-10-CM | POA: Diagnosis not present

## 2017-10-25 DIAGNOSIS — L600 Ingrowing nail: Secondary | ICD-10-CM | POA: Diagnosis not present

## 2017-10-25 DIAGNOSIS — F339 Major depressive disorder, recurrent, unspecified: Secondary | ICD-10-CM

## 2017-10-25 DIAGNOSIS — R350 Frequency of micturition: Secondary | ICD-10-CM | POA: Diagnosis not present

## 2017-10-25 DIAGNOSIS — M5137 Other intervertebral disc degeneration, lumbosacral region: Secondary | ICD-10-CM | POA: Diagnosis not present

## 2017-10-25 MED ORDER — CEPHALEXIN 500 MG PO CAPS: 500.0000 mg | ORAL_CAPSULE | Freq: Four times a day (QID) | ORAL | 0 refills | Status: DC

## 2017-10-25 MED ORDER — OXYCODONE-ACETAMINOPHEN 5-325 MG PO TABS: 1.0000 | ORAL_TABLET | ORAL | 0 refills | Status: DC | PRN

## 2017-10-25 MED ORDER — MELOXICAM 15 MG PO TABS: 15.0000 mg | ORAL_TABLET | Freq: Every day | ORAL | 3 refills | Status: DC

## 2017-10-25 MED ORDER — OMEPRAZOLE 40 MG PO CPDR: 40.0000 mg | DELAYED_RELEASE_CAPSULE | Freq: Two times a day (BID) | ORAL | 0 refills | Status: DC

## 2017-10-25 MED ORDER — OXYBUTYNIN CHLORIDE 5 MG PO TABS: 5.0000 mg | ORAL_TABLET | Freq: Two times a day (BID) | ORAL | 3 refills | Status: DC

## 2017-10-25 MED ORDER — CITALOPRAM HYDROBROMIDE 20 MG PO TABS: 20.0000 mg | ORAL_TABLET | Freq: Every day | ORAL | 3 refills | Status: DC

## 2017-10-25 MED ORDER — BACLOFEN 10 MG PO TABS: ORAL_TABLET | ORAL | 3 refills | Status: DC

## 2017-10-25 MED ORDER — GABAPENTIN 400 MG PO CAPS: 800.0000 mg | ORAL_CAPSULE | Freq: Three times a day (TID) | ORAL | 3 refills | Status: DC

## 2017-10-25 NOTE — Telephone Encounter (Signed)
Sent!

## 2017-10-25 NOTE — Telephone Encounter (Signed)
Pt notified of RX 

## 2017-10-25 NOTE — Telephone Encounter (Signed)
Please review and advise.

## 2017-10-26 LAB — CMP14+EGFR
ALBUMIN: 4 g/dL (ref 3.6–4.8)
ALK PHOS: 77 IU/L (ref 39–117)
ALT: 25 IU/L (ref 0–32)
AST: 28 IU/L (ref 0–40)
Albumin/Globulin Ratio: 1.7 (ref 1.2–2.2)
BILIRUBIN TOTAL: 0.4 mg/dL (ref 0.0–1.2)
BUN / CREAT RATIO: 23 (ref 12–28)
BUN: 16 mg/dL (ref 8–27)
CHLORIDE: 104 mmol/L (ref 96–106)
CO2: 28 mmol/L (ref 20–29)
Calcium: 9.2 mg/dL (ref 8.7–10.3)
Creatinine, Ser: 0.71 mg/dL (ref 0.57–1.00)
GFR calc Af Amer: 106 mL/min/{1.73_m2} (ref >59)
GFR calc non Af Amer: 92 mL/min/{1.73_m2} (ref >59)
GLUCOSE: 80 mg/dL (ref 65–99)
Globulin, Total: 2.3 g/dL (ref 1.5–4.5)
Potassium: 4.6 mmol/L (ref 3.5–5.2)
Sodium: 144 mmol/L (ref 134–144)
Total Protein: 6.3 g/dL (ref 6.0–8.5)

## 2017-10-26 LAB — LIPID PANEL
CHOL/HDL RATIO: 2.7 ratio (ref 0.0–4.4)
Cholesterol, Total: 197 mg/dL (ref 100–199)
HDL: 74 mg/dL (ref >39)
LDL CALC: 111 mg/dL — AB (ref 0–99)
TRIGLYCERIDES: 61 mg/dL (ref 0–149)
VLDL Cholesterol Cal: 12 mg/dL (ref 5–40)

## 2017-10-26 LAB — CBC WITH DIFFERENTIAL/PLATELET
BASOS ABS: 0 10*3/uL (ref 0.0–0.2)
Basos: 0 %
EOS (ABSOLUTE): 0.2 10*3/uL (ref 0.0–0.4)
Eos: 3 %
HEMOGLOBIN: 12.3 g/dL (ref 11.1–15.9)
Hematocrit: 37.1 % (ref 34.0–46.6)
Immature Grans (Abs): 0 10*3/uL (ref 0.0–0.1)
Immature Granulocytes: 0 %
LYMPHS ABS: 2.1 10*3/uL (ref 0.7–3.1)
Lymphs: 36 %
MCH: 31.1 pg (ref 26.6–33.0)
MCHC: 33.2 g/dL (ref 31.5–35.7)
MCV: 94 fL (ref 79–97)
MONOCYTES: 8 %
Monocytes Absolute: 0.4 10*3/uL (ref 0.1–0.9)
NEUTROS ABS: 3.1 10*3/uL (ref 1.4–7.0)
Neutrophils: 53 %
PLATELETS: 202 10*3/uL (ref 150–379)
RBC: 3.95 x10E6/uL (ref 3.77–5.28)
RDW: 13.4 % (ref 12.3–15.4)
WBC: 5.9 10*3/uL (ref 3.4–10.8)

## 2017-10-26 LAB — TSH: TSH: 2.07 u[IU]/mL (ref 0.450–4.500)

## 2017-10-27 DIAGNOSIS — F339 Major depressive disorder, recurrent, unspecified: Secondary | ICD-10-CM | POA: Insufficient documentation

## 2017-10-27 NOTE — Progress Notes (Signed)
 BP 131/79   Pulse (!) 53   Temp (!) 97.5 F (36.4 C) (Oral)   Ht 5' 1" (1.549 m)   Wt 229 lb (103.9 kg)   BMI 43.27 kg/m    Subjective:    Patient ID: Valerie Baker, female    DOB: 06/12/1956, 61 y.o.   MRN: [id number]  HPI: Valerie Baker is a 61 y.o. female presenting on 10/25/2017 for Medication Refill  This patient comes in for periodic recheck on medications and conditions including depression, degenerative disc disease,.  She does have She has been to podiatry before aspect to make an appointment again.  We will treat the infection until she can see them.  She states she is stable overall with other conditions..   All medications are reviewed today. There are no reports of any problems with the medications. All of the medical conditions are reviewed and updated.  Lab work is reviewed and will be ordered as medically necessary. There are no new problems reported with today's visit.   Past Medical History:  Diagnosis Date  . Adenomatous colon polyp 06/2006  . Allergy    SINUS  . Anxiety   . Anxiety and depression   . Arthritis   . Asthma   . Bladder incontinence   . Blood transfusion   . Cataract   . Depression   . Fibromyalgia   . GERD (gastroesophageal reflux disease)   . Hyperlipidemia   . Hypertension   . IBS (irritable bowel syndrome)   . Kidney stones   . Migraines   . Obesity   . Osteopenia    Relevant past medical, surgical, family and social history reviewed and updated as indicated. Interim medical history since our last visit reviewed. Allergies and medications reviewed and updated. DATA REVIEWED: CHART IN EPIC  Family History reviewed for pertinent findings.  Review of Systems  Constitutional: Negative.  Negative for activity change, fatigue and fever.  HENT: Negative.   Eyes: Negative.   Respiratory: Negative.  Negative for cough.   Cardiovascular: Negative.  Negative for chest pain.  Gastrointestinal: Negative.  Negative for  abdominal pain.  Endocrine: Negative.   Genitourinary: Negative.  Negative for dysuria.  Musculoskeletal: Positive for arthralgias, back pain and gait problem.  Skin: Negative.     Allergies as of 10/25/2017      Reactions   Sulfonamide Derivatives Rash      Medication List        Accurate as of 10/25/17 11:59 PM. Always use your most recent med list.          albuterol 108 (90 Base) MCG/ACT inhaler Commonly known as:  VENTOLIN HFA USE 2 PUFFS EVERY 6 HOURS AS NEEDED   albuterol 108 (90 Base) MCG/ACT inhaler Commonly known as:  PROVENTIL HFA;VENTOLIN HFA Inhale 2 puffs into the lungs every 6 (six) hours as needed for wheezing or shortness of breath.   baclofen 10 MG tablet Commonly known as:  LIORESAL Take 1 tablet by mouth 3 times a day as needed for muscle spasms   benzonatate 200 MG capsule Commonly known as:  TESSALON Take 1 capsule (200 mg total) by mouth 3 (three) times daily as needed.   CALCIUM 600+D 600-400 MG-UNIT tablet Generic drug:  Calcium Carbonate-Vitamin D Take 1 tablet by mouth 2 (two) times daily.   cephALEXin 500 MG capsule Commonly known as:  KEFLEX Take 1 capsule (500 mg total) by mouth 4 (four) times daily.   citalopram 20 MG tablet Commonly   known as:  CELEXA Take 1 tablet (20 mg total) by mouth daily.   diclofenac sodium 1 % Gel Commonly known as:  VOLTAREN APPLY 4 GRAMS 4 TIMES DAILY AS NEEDED   fluocinonide gel 0.05 % Commonly known as:  LIDEX Apply topically 2 (two) times daily.   fluticasone 50 MCG/ACT nasal spray Commonly known as:  FLONASE Place 2 sprays into both nostrils daily.   gabapentin 400 MG capsule Commonly known as:  NEURONTIN Take 2 capsules (800 mg total) by mouth 3 (three) times daily.   loratadine 10 MG tablet Commonly known as:  CLARITIN Take 10 mg by mouth daily as needed for allergies.   meloxicam 15 MG tablet Commonly known as:  MOBIC Take 1 tablet (15 mg total) by mouth daily.   multivitamin  tablet Take 1 tablet by mouth daily.   omeprazole 40 MG capsule Commonly known as:  PRILOSEC Take 1 capsule (40 mg total) by mouth 2 (two) times daily.   oxybutynin 5 MG tablet Commonly known as:  DITROPAN Take 1 tablet (5 mg total) by mouth 2 (two) times daily.   oxyCODONE-acetaminophen 5-325 MG tablet Commonly known as:  PERCOCET/ROXICET Take 1 tablet by mouth every 4 (four) hours as needed for severe pain.   oxyCODONE-acetaminophen 5-325 MG tablet Commonly known as:  ROXICET Take 1 tablet by mouth every 4 (four) hours as needed for severe pain.   oxyCODONE-acetaminophen 5-325 MG tablet Commonly known as:  ROXICET Take 1 tablet by mouth every 4 (four) hours as needed for severe pain.   traZODone 100 MG tablet Commonly known as:  DESYREL Take 1 tablet (100 mg total) by mouth at bedtime.   triamcinolone cream 0.1 % Commonly known as:  KENALOG Apply topically 2 (two) times daily.   Vitamin D3 2000 units Tabs Take 1 tablet by mouth daily.          Objective:    BP 131/79   Pulse (!) 53   Temp (!) 97.5 F (36.4 C) (Oral)   Ht 5' 1" (1.549 m)   Wt 229 lb (103.9 kg)   BMI 43.27 kg/m   Allergies  Allergen Reactions  . Sulfonamide Derivatives Rash    Wt Readings from Last 3 Encounters:  10/25/17 229 lb (103.9 kg)  09/23/17 237 lb 9.6 oz (107.8 kg)  09/19/17 234 lb (106.1 kg)    Physical Exam  Constitutional: She is oriented to person, place, and time. She appears well-developed and well-nourished.  HENT:  Head: Normocephalic and atraumatic.  Eyes: Conjunctivae and EOM are normal. Pupils are equal, round, and reactive to light.  Cardiovascular: Normal rate, regular rhythm, normal heart sounds and intact distal pulses.  Pulmonary/Chest: Effort normal and breath sounds normal.  Abdominal: Soft. Bowel sounds are normal.  Neurological: She is alert and oriented to person, place, and time. She has normal reflexes.  Skin: Skin is warm and dry. Lesion noted. No  rash noted. There is erythema.     Psychiatric: She has a normal mood and affect. Her behavior is normal. Judgment and thought content normal.    Results for orders placed or performed in visit on 10/25/17  CBC with Differential/Platelet  Result Value Ref Range   WBC 5.9 3.4 - 10.8 x10E3/uL   RBC 3.95 3.77 - 5.28 x10E6/uL   Hemoglobin 12.3 11.1 - 15.9 g/dL   Hematocrit 37.1 34.0 - 46.6 %   MCV 94 79 - 97 fL   MCH 31.1 26.6 - 33.0 pg   MCHC  33.2 31.5 - 35.7 g/dL   RDW 13.4 12.3 - 15.4 %   Platelets 202 150 - 379 x10E3/uL   Neutrophils 53 Not Estab. %   Lymphs 36 Not Estab. %   Monocytes 8 Not Estab. %   Eos 3 Not Estab. %   Basos 0 Not Estab. %   Neutrophils Absolute 3.1 1.4 - 7.0 x10E3/uL   Lymphocytes Absolute 2.1 0.7 - 3.1 x10E3/uL   Monocytes Absolute 0.4 0.1 - 0.9 x10E3/uL   EOS (ABSOLUTE) 0.2 0.0 - 0.4 x10E3/uL   Basophils Absolute 0.0 0.0 - 0.2 x10E3/uL   Immature Granulocytes 0 Not Estab. %   Immature Grans (Abs) 0.0 0.0 - 0.1 x10E3/uL  CMP14+EGFR  Result Value Ref Range   Glucose 80 65 - 99 mg/dL   BUN 16 8 - 27 mg/dL   Creatinine, Ser 0.71 0.57 - 1.00 mg/dL   GFR calc non Af Amer 92 >59 mL/min/1.73   GFR calc Af Amer 106 >59 mL/min/1.73   BUN/Creatinine Ratio 23 12 - 28   Sodium 144 134 - 144 mmol/L   Potassium 4.6 3.5 - 5.2 mmol/L   Chloride 104 96 - 106 mmol/L   CO2 28 20 - 29 mmol/L   Calcium 9.2 8.7 - 10.3 mg/dL   Total Protein 6.3 6.0 - 8.5 g/dL   Albumin 4.0 3.6 - 4.8 g/dL   Globulin, Total 2.3 1.5 - 4.5 g/dL   Albumin/Globulin Ratio 1.7 1.2 - 2.2   Bilirubin Total 0.4 0.0 - 1.2 mg/dL   Alkaline Phosphatase 77 39 - 117 IU/L   AST 28 0 - 40 IU/L   ALT 25 0 - 32 IU/L  Lipid panel  Result Value Ref Range   Cholesterol, Total 197 100 - 199 mg/dL   Triglycerides 61 0 - 149 mg/dL   HDL 74 >39 mg/dL   VLDL Cholesterol Cal 12 5 - 40 mg/dL   LDL Calculated 111 (H) 0 - 99 mg/dL   Chol/HDL Ratio 2.7 0.0 - 4.4 ratio  TSH  Result Value Ref Range   TSH  2.070 0.450 - 4.500 uIU/mL      Assessment & Plan:   1. Depression, recurrent (HCC) - citalopram (CELEXA) 20 MG tablet; Take 1 tablet (20 mg total) by mouth daily.  Dispense: 90 tablet; Refill: 3  2. Disc degeneration, lumbosacral - gabapentin (NEURONTIN) 400 MG capsule; Take 2 capsules (800 mg total) by mouth 3 (three) times daily.  Dispense: 540 capsule; Refill: 3 - meloxicam (MOBIC) 15 MG tablet; Take 1 tablet (15 mg total) by mouth daily.  Dispense: 90 tablet; Refill: 3 - oxyCODONE-acetaminophen (PERCOCET/ROXICET) 5-325 MG tablet; Take 1 tablet by mouth every 4 (four) hours as needed for severe pain.  Dispense: 120 tablet; Refill: 0 - oxyCODONE-acetaminophen (ROXICET) 5-325 MG tablet; Take 1 tablet by mouth every 4 (four) hours as needed for severe pain.  Dispense: 120 tablet; Refill: 0  3. Urinary frequency - oxybutynin (DITROPAN) 5 MG tablet; Take 1 tablet (5 mg total) by mouth 2 (two) times daily.  Dispense: 180 tablet; Refill: 3  4. Ingrown toenail of left foot with infection - Ambulatory referral to Podiatry  5. Well adult exam - CBC with Differential/Platelet - CMP14+EGFR - Lipid panel - TSH   Continue all other maintenance medications as listed above.  Follow up plan: Return in about 3 months (around 01/25/2018) for recheck.  Educational handout given for survey  Angel S. Jones PA-C [facility] [address]    [place], [place] [address] [phone]   10/27/2017, 10:04 PM

## 2017-11-19 ENCOUNTER — Other Ambulatory Visit: Payer: Self-pay | Admitting: Physician Assistant

## 2017-11-19 ENCOUNTER — Encounter: Payer: Self-pay | Admitting: Physician Assistant

## 2017-11-19 ENCOUNTER — Ambulatory Visit (INDEPENDENT_AMBULATORY_CARE_PROVIDER_SITE_OTHER): Payer: PPO | Admitting: Physician Assistant

## 2017-11-19 VITALS — BP 113/68 | HR 57 | Temp 97.7°F | Ht 61.0 in | Wt 239.8 lb

## 2017-11-19 DIAGNOSIS — J0100 Acute maxillary sinusitis, unspecified: Secondary | ICD-10-CM | POA: Diagnosis not present

## 2017-11-19 DIAGNOSIS — R921 Mammographic calcification found on diagnostic imaging of breast: Secondary | ICD-10-CM

## 2017-11-19 MED ORDER — AMOXICILLIN 500 MG PO CAPS: 1000.0000 mg | ORAL_CAPSULE | Freq: Two times a day (BID) | ORAL | 0 refills | Status: DC

## 2017-11-19 NOTE — Progress Notes (Signed)
BP 113/68   Pulse (!) 57   Temp 97.7 F (36.5 C) (Oral)   Ht 5\' 1"  (1.549 m)   Wt 239 lb 12.8 oz (108.8 kg)   BMI 45.31 kg/m    Subjective:    Patient ID: Valerie Baker, female    DOB: [birth year], 62 y.o.   MRN: [id number]  HPI: Valerie Baker is a 62 y.o. female presenting on 11/19/2017 for Facial Pain  This patient has had many days of sinus headache and postnasal drainage. There is copious drainage at times. Denies any fever at this time. There has been a history of sinus infections in the past.  No history of sinus surgery. There is cough at night. It has become more prevalent in recent days.   Past Medical History:  Diagnosis Date  . Adenomatous colon polyp 06/2006  . Allergy    SINUS  . Anxiety   . Anxiety and depression   . Arthritis   . Asthma   . Bladder incontinence   . Blood transfusion   . Cataract   . Depression   . Fibromyalgia   . GERD (gastroesophageal reflux disease)   . Hyperlipidemia   . Hypertension   . IBS (irritable bowel syndrome)   . Kidney stones   . Migraines   . Obesity   . Osteopenia    Relevant past medical, surgical, family and social history reviewed and updated as indicated. Interim medical history since our last visit reviewed. Allergies and medications reviewed and updated. DATA REVIEWED: CHART IN EPIC  Family History reviewed for pertinent findings.  Review of Systems  Constitutional: Negative for activity change, appetite change, chills and fatigue.  HENT: Positive for congestion, postnasal drip, sinus pain and sore throat.   Eyes: Negative.   Respiratory: Negative for cough and wheezing.   Cardiovascular: Negative.  Negative for chest pain, palpitations and leg swelling.  Gastrointestinal: Negative.   Genitourinary: Negative.   Musculoskeletal: Negative.   Skin: Negative.   Neurological: Negative for headaches.    Allergies as of 11/19/2017      Reactions   Sulfonamide Derivatives Rash      Medication List          Accurate as of 11/19/17  2:44 PM. Always use your most recent med list.          albuterol 108 (90 Base) MCG/ACT inhaler Commonly known as:  VENTOLIN HFA USE 2 PUFFS EVERY 6 HOURS AS NEEDED   albuterol 108 (90 Base) MCG/ACT inhaler Commonly known as:  PROVENTIL HFA;VENTOLIN HFA Inhale 2 puffs into the lungs every 6 (six) hours as needed for wheezing or shortness of breath.   amoxicillin 500 MG capsule Commonly known as:  AMOXIL Take 2 capsules (1,000 mg total) by mouth 2 (two) times daily.   baclofen 10 MG tablet Commonly known as:  LIORESAL Take 1 tablet by mouth 3 times a day as needed for muscle spasms   CALCIUM 600+D 600-400 MG-UNIT tablet Generic drug:  Calcium Carbonate-Vitamin D Take 1 tablet by mouth 2 (two) times daily.   citalopram 20 MG tablet Commonly known as:  CELEXA Take 1 tablet (20 mg total) by mouth daily.   diclofenac sodium 1 % Gel Commonly known as:  VOLTAREN APPLY 4 GRAMS 4 TIMES DAILY AS NEEDED   fluocinonide gel 0.05 % Commonly known as:  LIDEX Apply topically 2 (two) times daily.   fluticasone 50 MCG/ACT nasal spray Commonly known as:  FLONASE Place 2 sprays into both  nostrils daily.   gabapentin 400 MG capsule Commonly known as:  NEURONTIN Take 2 capsules (800 mg total) by mouth 3 (three) times daily.   loratadine 10 MG tablet Commonly known as:  CLARITIN Take 10 mg by mouth daily as needed for allergies.   meloxicam 15 MG tablet Commonly known as:  MOBIC Take 1 tablet (15 mg total) by mouth daily.   multivitamin tablet Take 1 tablet by mouth daily.   omeprazole 40 MG capsule Commonly known as:  PRILOSEC Take 1 capsule (40 mg total) by mouth 2 (two) times daily.   oxybutynin 5 MG tablet Commonly known as:  DITROPAN Take 1 tablet (5 mg total) by mouth 2 (two) times daily.   oxyCODONE-acetaminophen 5-325 MG tablet Commonly known as:  PERCOCET/ROXICET Take 1 tablet by mouth every 4 (four) hours as needed for severe  pain.   oxyCODONE-acetaminophen 5-325 MG tablet Commonly known as:  ROXICET Take 1 tablet by mouth every 4 (four) hours as needed for severe pain.   oxyCODONE-acetaminophen 5-325 MG tablet Commonly known as:  ROXICET Take 1 tablet by mouth every 4 (four) hours as needed for severe pain.   traZODone 100 MG tablet Commonly known as:  DESYREL Take 1 tablet (100 mg total) by mouth at bedtime.   triamcinolone cream 0.1 % Commonly known as:  KENALOG Apply topically 2 (two) times daily.   Vitamin D3 2000 units Tabs Take 1 tablet by mouth daily.          Objective:    BP 113/68   Pulse (!) 57   Temp 97.7 F (36.5 C) (Oral)   Ht 5\' 1"  (1.549 m)   Wt 239 lb 12.8 oz (108.8 kg)   BMI 45.31 kg/m   Allergies  Allergen Reactions  . Sulfonamide Derivatives Rash    Wt Readings from Last 3 Encounters:  11/19/17 239 lb 12.8 oz (108.8 kg)  10/25/17 229 lb (103.9 kg)  09/23/17 237 lb 9.6 oz (107.8 kg)    Physical Exam  Constitutional: She is oriented to person, place, and time. She appears well-developed and well-nourished.  HENT:  Head: Normocephalic and atraumatic.  Right Ear: Tympanic membrane and external ear normal. No middle ear effusion.  Left Ear: Tympanic membrane and external ear normal.  No middle ear effusion.  Nose: Mucosal edema and rhinorrhea present. Right sinus exhibits no maxillary sinus tenderness. Left sinus exhibits maxillary sinus tenderness.  Mouth/Throat: Uvula is midline. Posterior oropharyngeal erythema present.  Eyes: Pupils are equal, round, and reactive to light. Conjunctivae and EOM are normal. Right eye exhibits no discharge. Left eye exhibits no discharge.  Neck: Normal range of motion.  Cardiovascular: Normal rate, regular rhythm and normal heart sounds.  Pulmonary/Chest: Effort normal and breath sounds normal. No respiratory distress. She has no wheezes.  Abdominal: Soft.  Lymphadenopathy:    She has no cervical adenopathy.  Neurological: She  is alert and oriented to person, place, and time.  Skin: Skin is warm and dry.  Psychiatric: She has a normal mood and affect.        Assessment & Plan:   1. Acute non-recurrent maxillary sinusitis - amoxicillin (AMOXIL) 500 MG capsule; Take 2 capsules (1,000 mg total) by mouth 2 (two) times daily.  Dispense: 40 capsule; Refill: 0    Continue all other maintenance medications as listed above.  Follow up plan: No follow-ups on file.  Educational handout given for [place] PA-C [place] [address]  [place], [place] [address] [phone]   11/19/2017, 2:44 PM

## 2017-11-20 ENCOUNTER — Telehealth (INDEPENDENT_AMBULATORY_CARE_PROVIDER_SITE_OTHER): Payer: Self-pay

## 2017-11-20 NOTE — Telephone Encounter (Signed)
[facility] wanted to know if patient needed pre-medication before dental procedure.   Advised that patient did not need any pre-medication.  Faxed above message to [facility] at [phone].

## 2017-11-26 ENCOUNTER — Ambulatory Visit
Admission: RE | Admit: 2017-11-26 | Discharge: 2017-11-26 | Disposition: A | Discharge status: Home / Self Care | Payer: PPO | Source: Ambulatory Visit | Attending: Physician Assistant | Admitting: Physician Assistant

## 2017-11-26 DIAGNOSIS — R921 Mammographic calcification found on diagnostic imaging of breast: Secondary | ICD-10-CM

## 2017-11-26 DIAGNOSIS — N6012 Diffuse cystic mastopathy of left breast: Secondary | ICD-10-CM | POA: Diagnosis not present

## 2017-11-26 DIAGNOSIS — N6011 Diffuse cystic mastopathy of right breast: Secondary | ICD-10-CM | POA: Diagnosis not present

## 2017-11-26 HISTORY — PX: BREAST BIOPSY: SHX20

## 2017-12-26 ENCOUNTER — Telehealth (INDEPENDENT_AMBULATORY_CARE_PROVIDER_SITE_OTHER): Payer: Self-pay | Admitting: Orthopaedic Surgery

## 2017-12-26 NOTE — Telephone Encounter (Signed)
Patient requesting cortisone injection in her knee at appt on Monday

## 2017-12-30 ENCOUNTER — Encounter (INDEPENDENT_AMBULATORY_CARE_PROVIDER_SITE_OTHER): Payer: Self-pay | Admitting: Orthopaedic Surgery

## 2017-12-30 ENCOUNTER — Ambulatory Visit (INDEPENDENT_AMBULATORY_CARE_PROVIDER_SITE_OTHER): Payer: PPO | Admitting: Orthopaedic Surgery

## 2017-12-30 DIAGNOSIS — M1711 Unilateral primary osteoarthritis, right knee: Secondary | ICD-10-CM | POA: Insufficient documentation

## 2017-12-30 DIAGNOSIS — M25561 Pain in right knee: Secondary | ICD-10-CM

## 2017-12-30 DIAGNOSIS — G8929 Other chronic pain: Secondary | ICD-10-CM

## 2017-12-30 DIAGNOSIS — M179 Osteoarthritis of knee, unspecified: Secondary | ICD-10-CM | POA: Insufficient documentation

## 2017-12-30 MED ORDER — LIDOCAINE HCL 1 % IJ SOLN
3.0000 mL | INTRAMUSCULAR | Status: AC | PRN
  Administered 2017-12-30: 3 mL

## 2017-12-30 MED ORDER — METHYLPREDNISOLONE ACETATE 40 MG/ML IJ SUSP
40.0000 mg | INTRAMUSCULAR | Status: AC | PRN
  Administered 2017-12-30: 40 mg via INTRA_ARTICULAR

## 2017-12-30 NOTE — Progress Notes (Signed)
Office Visit Note   Patient: Valerie Baker           Date of Birth: Dec 29, 1955           MRN: [id number] Visit Date: 12/30/2017              Requested by: Terald Sleeper, PA-C [address] [place], [place] [address] PCP: Terald Sleeper, PA-C   Assessment & Plan: Visit Diagnoses:  1. Chronic pain of right knee   2. Unilateral primary osteoarthritis, right knee     Plan: She still wants to stay as conservative as possible and I agree with this.  She did wish for steroid injection in her right knee and she tolerated this well.  We will see her back in a month to place hyaluronic acid into the right knee as well.  If she can continue to have problems with repeat x-rays of both knees.  Follow-Up Instructions: Return in about 1 month (around 01/27/2018).   Orders:  Orders Placed This Encounter  Procedures  . Large Joint Inj   No orders of the defined types were placed in this encounter.     Procedures: Large Joint Inj: R knee on 12/30/2017 9:15 AM Indications: diagnostic evaluation and pain Details: 22 G 1.5 in needle, superolateral approach  Arthrogram: No  Medications: 3 mL lidocaine 1 %; 40 mg methylPREDNISolone acetate 40 MG/ML Outcome: tolerated well, no immediate complications Procedure, treatment alternatives, risks and benefits explained, specific risks discussed. Consent was given by the patient. Immediately prior to procedure a time out was called to verify the correct patient, procedure, equipment, support staff and site/side marked as required. Patient was prepped and draped in the usual sterile fashion.       Clinical Data: No additional findings.   Subjective: Chief Complaint  Patient presents with  . Right Knee - Pain  The patient is well-known to [place].  She has known moderate arthritis involving her right knee and we last saw a year ago placed hyaluronic acid in her knee then.  She is had a flareup of pain again is only on the medial joint line.  She  has a history of a revision knee surgery done in 2010 on her left knee.  She says she has a little bit of medial joint line tenderness on that knee but spanning her right knee.  She is also had extensive spine surgery with the last being in 2014.  HPI  Review of Systems There are no other active medical issues right now.  Objective: Vital Signs: There were no vitals taken for this visit.  Physical Exam  Ortho Exam Examination of her right knee shows only medial joint line tenderness with no effusion.  She has painful range of motion as well. Specialty Comments:  No specialty comments available.  Imaging: No results found. Previous x-rays from November 2017 showed moderate arthritic changes with medial joint space narrowing and patellofemoral arthritic changes.  PMFS History: Patient Active Problem List   Diagnosis Date Noted  . Unilateral primary osteoarthritis, right knee 12/30/2017  . Depression, recurrent ([place]) 10/27/2017  . Contact dermatitis and eczema due to plant 11/29/2016  . Overactive bladder 11/29/2016  . Frequent urination 01/23/2016  . Urgency of urination 01/23/2016  . Rash 01/23/2016  . Urinary frequency 04/21/2015  . Stress fracture of pelvis 11/03/2014  . Disc degeneration, lumbosacral 11/03/2014  . Osteopenia 11/03/2014  . Severe obesity (BMI >= 40) ([place]) 11/03/2014  . Essential hypertension 04/04/2009  .  ASTHMA 04/04/2009  . MIGRAINES, HX OF 04/04/2009   Past Medical History:  Diagnosis Date  . Adenomatous colon polyp 06/2006  . Allergy    SINUS  . Anxiety   . Anxiety and depression   . Arthritis   . Asthma   . Bladder incontinence   . Blood transfusion   . Cataract   . Depression   . Fibromyalgia   . GERD (gastroesophageal reflux disease)   . Hyperlipidemia   . Hypertension   . IBS (irritable bowel syndrome)   . Kidney stones   . Migraines   . Obesity   . Osteopenia     Family History  Problem Relation Age of Onset  . Heart disease  Mother   . Diabetes Mother   . Melanoma Mother        nose  . Diabetes Brother   . Cirrhosis Brother   . Hypertension Brother   . Stroke Father   . Hypertension Sister   . Hypertension Brother     Past Surgical History:  Procedure Laterality Date  . ABDOMINAL HYSTERECTOMY    . APPENDECTOMY    . BACK SURGERY  2000  . DEBRIDEMENT TENNIS ELBOW    . ELBOW SURGERY  2003  . EYE SURGERY    . [place]   right  . LUMBAR FUSION  2007  . LUMBAR FUSION  2002,2003,2007,2008  . NASAL SINUS SURGERY  2003  . NISSEN FUNDOPLICATION  9528  . ROTATOR CUFF REPAIR  [id number]  . SPINAL CORD STIMULATOR IMPLANT  2005  . TOTAL KNEE ARTHROPLASTY  2008,2009   Social History   Occupational History  . Occupation: Disabled  Tobacco Use  . Smoking status: Former Smoker    Years: 5.00    Last attempt to quit: 11/02/2005    Years since quitting: 12.1  . Smokeless tobacco: Never Used  Substance and Sexual Activity  . Alcohol use: No  . Drug use: No  . Sexual activity: Yes

## 2018-01-01 ENCOUNTER — Telehealth (INDEPENDENT_AMBULATORY_CARE_PROVIDER_SITE_OTHER): Payer: Self-pay

## 2018-01-01 NOTE — Telephone Encounter (Signed)
Submitted application online for Monovisc injection, right knee. 

## 2018-01-08 ENCOUNTER — Telehealth (INDEPENDENT_AMBULATORY_CARE_PROVIDER_SITE_OTHER): Payer: Self-pay

## 2018-01-08 NOTE — Telephone Encounter (Signed)
Submitted application online for SynviscOne injection, right knee.

## 2018-01-08 NOTE — Telephone Encounter (Signed)
Talked with patient to clarify insurance and to advise her that she is not covered through her insurance for Monovisc injection.  Advised her that I will apply for SynviscOne injection to see if insurance will cover that gel injection.

## 2018-01-29 ENCOUNTER — Ambulatory Visit (INDEPENDENT_AMBULATORY_CARE_PROVIDER_SITE_OTHER): Payer: PPO | Admitting: Orthopaedic Surgery

## 2018-02-03 ENCOUNTER — Ambulatory Visit (INDEPENDENT_AMBULATORY_CARE_PROVIDER_SITE_OTHER): Payer: PPO | Admitting: Physician Assistant

## 2018-02-03 ENCOUNTER — Encounter: Payer: Self-pay | Admitting: Physician Assistant

## 2018-02-03 DIAGNOSIS — M5137 Other intervertebral disc degeneration, lumbosacral region: Secondary | ICD-10-CM

## 2018-02-03 DIAGNOSIS — J069 Acute upper respiratory infection, unspecified: Secondary | ICD-10-CM | POA: Diagnosis not present

## 2018-02-03 MED ORDER — BETAMETHASONE DIPROPIONATE AUG 0.05 % EX CREA: TOPICAL_CREAM | Freq: Two times a day (BID) | CUTANEOUS | 2 refills | Status: DC

## 2018-02-03 MED ORDER — OXYCODONE-ACETAMINOPHEN 5-325 MG PO TABS: 1.0000 | ORAL_TABLET | ORAL | 0 refills | Status: DC | PRN

## 2018-02-03 MED ORDER — FLUTICASONE PROPIONATE 50 MCG/ACT NA SUSP: 2.0000 | Freq: Every day | NASAL | 2 refills | Status: DC

## 2018-02-03 NOTE — Progress Notes (Signed)
BP 139/79   Pulse 61   Temp 98.6 F (37 C) (Oral)   Ht _0  (1.549 m)   Wt 234 lb 6.4 oz (106.3 kg)   BMI 44.29 kg/m    Subjective:    Patient ID: Valerie Baker, female    DOB: 10/25/1955, 62 y.o.   MRN: [id number]  HPI: Valerie Baker is a 62 y.o. female presenting on 02/03/2018 for Rash (on legs started Friday )  This patient comes in having had a rash for about 4 days.  It is occurring on her lower legs.  It was bright red originally but has faded some.  Many years ago she was diagnosed with eczema or atopic dermatitis.  She has not had any cream to use lately.  She denies any new products or anything that she is allergic to no changes in detergent or clothing.  This patient comes in for periodic recheck on medications and conditions including DDD and chronic pain.  All medications are reviewed t with a disc and someoday. There are no reports of any problems with the medications. All of the medical conditions are reviewed and updated.  Lab work is reviewed and will be ordered as medically necessary. There are no new problems reported with today's visit.  Past Medical History:  Diagnosis Date  . Adenomatous colon polyp 06/2006  . Allergy    SINUS  . Anxiety   . Anxiety and depression   . Arthritis   . Asthma   . Bladder incontinence   . Blood transfusion   . Cataract   . Depression   . Fibromyalgia   . GERD (gastroesophageal reflux disease)   . Hyperlipidemia   . Hypertension   . IBS (irritable bowel syndrome)   . Kidney stones   . Migraines   . Obesity   . Osteopenia    Relevant past medical, surgical, family and social history reviewed and updated as indicated. Interim medical history since our last visit reviewed. Allergies and medications reviewed and updated. DATA REVIEWED: CHART IN EPIC  Family History reviewed for pertinent findings.  Review of Systems  Constitutional: Negative.  Negative for activity change, fatigue and fever.  HENT:  Negative.   Eyes: Negative.   Respiratory: Negative.  Negative for cough.   Cardiovascular: Negative.  Negative for chest pain.  Gastrointestinal: Negative.  Negative for abdominal pain.  Endocrine: Negative.   Genitourinary: Negative.  Negative for dysuria.  Musculoskeletal: Negative.   Skin: Positive for color change and rash.  Neurological: Negative.     Allergies as of 02/03/2018      Reactions   Sulfonamide Derivatives Rash      Medication List        Accurate as of 02/03/18  2:13 PM. Always use your most recent med list.          albuterol 108 (90 Base) MCG/ACT inhaler Commonly known as:  VENTOLIN HFA USE 2 PUFFS EVERY 6 HOURS AS NEEDED   albuterol 108 (90 Base) MCG/ACT inhaler Commonly known as:  PROVENTIL HFA;VENTOLIN HFA Inhale 2 puffs into the lungs every 6 (six) hours as needed for wheezing or shortness of breath.   augmented betamethasone dipropionate 0.05 % cream Commonly known as:  DIPROLENE-AF Apply topically 2 (two) times daily.   baclofen 10 MG tablet Commonly known as:  LIORESAL Take 1 tablet by mouth 3 times a day as needed for muscle spasms   betamethasone valerate 0.1 % cream Commonly known as:  VALISONE Apply topically 2 (two) times daily.   CALCIUM 600+D 600-400 MG-UNIT tablet Generic drug:  Calcium Carbonate-Vitamin D Take 1 tablet by mouth 2 (two) times daily.   citalopram 20 MG tablet Commonly known as:  CELEXA Take 1 tablet (20 mg total) by mouth daily.   diclofenac sodium 1 % Gel Commonly known as:  VOLTAREN APPLY 4 GRAMS 4 TIMES DAILY AS NEEDED   fluocinonide gel 0.05 % Commonly known as:  LIDEX Apply topically 2 (two) times daily.   fluticasone 50 MCG/ACT nasal spray Commonly known as:  FLONASE Place 2 sprays into both nostrils daily.   gabapentin 400 MG capsule Commonly known as:  NEURONTIN Take 2 capsules (800 mg total) by mouth 3 (three) times daily.   loratadine 10 MG tablet Commonly known as:  CLARITIN Take 10 mg  by mouth daily as needed for allergies.   meloxicam 15 MG tablet Commonly known as:  MOBIC Take 1 tablet (15 mg total) by mouth daily.   multivitamin tablet Take 1 tablet by mouth daily.   omeprazole 40 MG capsule Commonly known as:  PRILOSEC Take 1 capsule (40 mg total) by mouth 2 (two) times daily.   oxybutynin 5 MG tablet Commonly known as:  DITROPAN Take 1 tablet (5 mg total) by mouth 2 (two) times daily.   oxyCODONE-acetaminophen 5-325 MG tablet Commonly known as:  PERCOCET/ROXICET Take 1 tablet by mouth every 4 (four) hours as needed for severe pain.   oxyCODONE-acetaminophen 5-325 MG tablet Commonly known as:  ROXICET Take 1 tablet by mouth every 4 (four) hours as needed for severe pain.   oxyCODONE-acetaminophen 5-325 MG tablet Commonly known as:  ROXICET Take 1 tablet by mouth every 4 (four) hours as needed for severe pain.   traZODone 100 MG tablet Commonly known as:  DESYREL Take 1 tablet (100 mg total) by mouth at bedtime.   triamcinolone cream 0.1 % Commonly known as:  KENALOG Apply topically 2 (two) times daily.   Vitamin D3 2000 units Tabs Take 1 tablet by mouth daily.          Objective:    BP 139/79   Pulse 61   Temp 98.6 F (37 C) (Oral)   Ht _0  (1.549 m)   Wt 234 lb 6.4 oz (106.3 kg)   BMI 44.29 kg/m   Allergies  Allergen Reactions  . Sulfonamide Derivatives Rash    Wt Readings from Last 3 Encounters:  02/03/18 234 lb 6.4 oz (106.3 kg)  11/19/17 239 lb 12.8 oz (108.8 kg)  10/25/17 229 lb (103.9 kg)    Physical Exam  Constitutional: She is oriented to person, place, and time. She appears well-developed and well-nourished.  HENT:  Head: Normocephalic and atraumatic.  Eyes: Pupils are equal, round, and reactive to light. Conjunctivae and EOM are normal.  Cardiovascular: Normal rate, regular rhythm, normal heart sounds and intact distal pulses.  Pulmonary/Chest: Effort normal and breath sounds normal.  Abdominal: Soft. Bowel  sounds are normal.  Neurological: She is alert and oriented to person, place, and time. She has normal reflexes.  Skin: Skin is warm and dry. Lesion and rash noted. Rash is macular. There is erythema.     Psychiatric: She has a normal mood and affect. Her behavior is normal. Judgment and thought content normal.    Results for orders placed or performed in visit on 10/25/17  CBC with Differential/Platelet  Result Value Ref Range   WBC 5.9 3.4 - 10.8 x10E3/uL   RBC  3.95 3.77 - 5.28 x10E6/uL   Hemoglobin 12.3 11.1 - 15.9 g/dL   Hematocrit 37.1 34.0 - 46.6 %   MCV 94 79 - 97 fL   MCH 31.1 26.6 - 33.0 pg   MCHC 33.2 31.5 - 35.7 g/dL   RDW 13.4 12.3 - 15.4 %   Platelets 202 150 - 379 x10E3/uL   Neutrophils 53 Not Estab. %   Lymphs 36 Not Estab. %   Monocytes 8 Not Estab. %   Eos 3 Not Estab. %   Basos 0 Not Estab. %   Neutrophils Absolute 3.1 1.4 - 7.0 x10E3/uL   Lymphocytes Absolute 2.1 0.7 - 3.1 x10E3/uL   Monocytes Absolute 0.4 0.1 - 0.9 x10E3/uL   EOS (ABSOLUTE) 0.2 0.0 - 0.4 x10E3/uL   Basophils Absolute 0.0 0.0 - 0.2 x10E3/uL   Immature Granulocytes 0 Not Estab. %   Immature Grans (Abs) 0.0 0.0 - 0.1 x10E3/uL  CMP14+EGFR  Result Value Ref Range   Glucose 80 65 - 99 mg/dL   BUN 16 8 - 27 mg/dL   Creatinine, Ser 0.71 0.57 - 1.00 mg/dL   GFR calc non Af Amer 92 >59 mL/min/1.73   GFR calc Af Amer 106 >59 mL/min/1.73   BUN/Creatinine Ratio 23 12 - 28   Sodium 144 134 - 144 mmol/L   Potassium 4.6 3.5 - 5.2 mmol/L   Chloride 104 96 - 106 mmol/L   CO2 28 20 - 29 mmol/L   Calcium 9.2 8.7 - 10.3 mg/dL   Total Protein 6.3 6.0 - 8.5 g/dL   Albumin 4.0 3.6 - 4.8 g/dL   Globulin, Total 2.3 1.5 - 4.5 g/dL   Albumin/Globulin Ratio 1.7 1.2 - 2.2   Bilirubin Total 0.4 0.0 - 1.2 mg/dL   Alkaline Phosphatase 77 39 - 117 IU/L   AST 28 0 - 40 IU/L   ALT 25 0 - 32 IU/L  Lipid panel  Result Value Ref Range   Cholesterol, Total 197 100 - 199 mg/dL   Triglycerides 61 0 - 149 mg/dL    HDL 74 >39 mg/dL   VLDL Cholesterol Cal 12 5 - 40 mg/dL   LDL Calculated 111 (H) 0 - 99 mg/dL   Chol/HDL Ratio 2.7 0.0 - 4.4 ratio  TSH  Result Value Ref Range   TSH 2.070 0.450 - 4.500 uIU/mL      Assessment & Plan:   1. Viral upper respiratory tract infection - fluticasone (FLONASE) 50 MCG/ACT nasal spray; Place 2 sprays into both nostrils daily.  Dispense: 16 g; Refill: 2  2. Disc degeneration, lumbosacral - oxyCODONE-acetaminophen (PERCOCET/ROXICET) 5-325 MG tablet; Take 1 tablet by mouth every 4 (four) hours as needed for severe pain.  Dispense: 120 tablet; Refill: 0 - oxyCODONE-acetaminophen (ROXICET) 5-325 MG tablet; Take 1 tablet by mouth every 4 (four) hours as needed for severe pain.  Dispense: 120 tablet; Refill: 0   Continue all other maintenance medications as listed above.  Follow up plan: No follow-ups on file.  Educational handout given for [place] PA-C [place] [address]  [place], [place] [address] [phone]   02/03/2018, 2:13 PM

## 2018-03-19 ENCOUNTER — Ambulatory Visit (INDEPENDENT_AMBULATORY_CARE_PROVIDER_SITE_OTHER): Payer: PPO | Admitting: Pediatrics

## 2018-03-19 ENCOUNTER — Encounter: Payer: Self-pay | Admitting: Pediatrics

## 2018-03-19 ENCOUNTER — Ambulatory Visit (INDEPENDENT_AMBULATORY_CARE_PROVIDER_SITE_OTHER): Payer: PPO

## 2018-03-19 ENCOUNTER — Other Ambulatory Visit: Payer: Self-pay | Admitting: Pediatrics

## 2018-03-19 VITALS — BP 130/75 | HR 61 | Temp 97.6°F | Ht 61.0 in | Wt 233.0 lb

## 2018-03-19 DIAGNOSIS — R109 Unspecified abdominal pain: Secondary | ICD-10-CM | POA: Diagnosis not present

## 2018-03-19 DIAGNOSIS — R0781 Pleurodynia: Secondary | ICD-10-CM | POA: Diagnosis not present

## 2018-03-19 DIAGNOSIS — M546 Pain in thoracic spine: Secondary | ICD-10-CM

## 2018-03-19 LAB — MICROSCOPIC EXAMINATION
BACTERIA UA: NONE SEEN
Renal Epithel, UA: NONE SEEN /hpf

## 2018-03-19 LAB — URINALYSIS, COMPLETE
Bilirubin, UA: NEGATIVE
GLUCOSE, UA: NEGATIVE
KETONES UA: NEGATIVE
Leukocytes, UA: NEGATIVE
Nitrite, UA: NEGATIVE
PROTEIN UA: NEGATIVE
RBC, UA: NEGATIVE
SPEC GRAV UA: 1.02 (ref 1.005–1.030)
UUROB: 0.2 mg/dL (ref 0.2–1.0)
pH, UA: 6.5 (ref 5.0–7.5)
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
## 2018-03-22 LAB — URINE CULTURE

## 2018-04-08 ENCOUNTER — Other Ambulatory Visit: Payer: Self-pay | Admitting: Physician Assistant

## 2018-04-08 DIAGNOSIS — Z1231 Encounter for screening mammogram for malignant neoplasm of breast: Secondary | ICD-10-CM

## 2018-04-10 ENCOUNTER — Other Ambulatory Visit: Payer: Self-pay | Admitting: Physician Assistant

## 2018-04-16 DIAGNOSIS — M7918 Myalgia, other site: Secondary | ICD-10-CM | POA: Diagnosis not present

## 2018-04-16 DIAGNOSIS — M545 Low back pain: Secondary | ICD-10-CM | POA: Diagnosis not present

## 2018-04-16 DIAGNOSIS — M25511 Pain in right shoulder: Secondary | ICD-10-CM | POA: Diagnosis not present

## 2018-04-16 DIAGNOSIS — M542 Cervicalgia: Secondary | ICD-10-CM | POA: Diagnosis not present

## 2018-04-16 DIAGNOSIS — M5416 Radiculopathy, lumbar region: Secondary | ICD-10-CM | POA: Diagnosis not present

## 2018-04-22 ENCOUNTER — Ambulatory Visit: Payer: PPO | Admitting: Physician Assistant

## 2018-05-21 ENCOUNTER — Ambulatory Visit
Admission: RE | Admit: 2018-05-21 | Discharge: 2018-05-21 | Disposition: A | Discharge status: Home / Self Care | Payer: PPO | Source: Ambulatory Visit | Attending: Physician Assistant | Admitting: Physician Assistant

## 2018-05-21 DIAGNOSIS — Z1231 Encounter for screening mammogram for malignant neoplasm of breast: Secondary | ICD-10-CM

## 2018-06-17 ENCOUNTER — Ambulatory Visit (INDEPENDENT_AMBULATORY_CARE_PROVIDER_SITE_OTHER): Payer: PPO

## 2018-06-17 DIAGNOSIS — Z23 Encounter for immunization: Secondary | ICD-10-CM

## 2018-07-03 ENCOUNTER — Encounter: Payer: Self-pay | Admitting: Pediatrics

## 2018-07-03 ENCOUNTER — Ambulatory Visit (INDEPENDENT_AMBULATORY_CARE_PROVIDER_SITE_OTHER): Payer: PPO | Admitting: Pediatrics

## 2018-07-03 VITALS — BP 135/73 | HR 71 | Temp 98.1°F | Resp 22 | Ht 61.0 in | Wt 247.8 lb

## 2018-07-03 DIAGNOSIS — B379 Candidiasis, unspecified: Secondary | ICD-10-CM | POA: Diagnosis not present

## 2018-07-03 DIAGNOSIS — R05 Cough: Secondary | ICD-10-CM

## 2018-07-03 DIAGNOSIS — R059 Cough, unspecified: Secondary | ICD-10-CM

## 2018-07-03 DIAGNOSIS — H65112 Acute and subacute allergic otitis media (mucoid) (sanguinous) (serous), left ear: Secondary | ICD-10-CM | POA: Diagnosis not present

## 2018-07-03 MED ORDER — GUAIFENESIN-CODEINE 100-10 MG/5ML PO SOLN: 5.0000 mL | Freq: Three times a day (TID) | ORAL | 0 refills | Status: DC | PRN

## 2018-07-03 MED ORDER — FLUCONAZOLE 150 MG PO TABS: 150.0000 mg | ORAL_TABLET | ORAL | 0 refills | Status: DC | PRN

## 2018-07-03 MED ORDER — AZITHROMYCIN 250 MG PO TABS: ORAL_TABLET | ORAL | 0 refills | Status: DC

## 2018-07-03 NOTE — Progress Notes (Signed)
  Subjective:   Patient ID: Valerie Baker, female    DOB: 15-Sep-1955, 62 y.o.   MRN: [id number] CC: Cough and Chest congestion  HPI: Valerie Baker is a 62 y.o. female   Symptoms started about a week ago.  Started getting better for last 2 days, then last night into this morning is been feeling a lot worse.  No fevers that she knows of.  Cough bothering her the most.  Also with sinus pressure and drainage.  Both ears feel pressure and fluid at times.  Appetite is been slightly down.  Swelling in lower legs at times, not bad today.  Worse at the end of the day.  Takes pain medicine about once a day.  Has been taking DayQuil and NyQuil.  Has been helping some.  She is in keeping track of her Tylenol dosing.  Does have Flonase at home, has been taking daily.  Takes Claritin most days.  Asks for prescription cough medicine, has helped in the past.  Tends to get yeast infections when she is on antibiotics.  Relevant past medical, surgical, family and social history reviewed. Allergies and medications reviewed and updated. Social History   Tobacco Use  Smoking Status Former Smoker  . Years: 5.00  . Last attempt to quit: 11/02/2005  . Years since quitting: 12.6  Smokeless Tobacco Never Used   ROS: Per HPI   Objective:    BP 135/73   Pulse 71   Temp 98.1 F (36.7 C) (Oral)   Resp (!) 22   Ht 5\' 1"  (1.549 m)   Wt 247 lb 12.8 oz (112.4 kg)   SpO2 97%   BMI 46.82 kg/m   Wt Readings from Last 3 Encounters:  07/03/18 247 lb 12.8 oz (112.4 kg)  03/19/18 233 lb (105.7 kg)  02/03/18 234 lb 6.4 oz (106.3 kg)    Gen: NAD, alert, cooperative with exam, NCAT EYES: EOMI, no conjunctival injection, or no icterus ENT: Left TM with layering effusion, right TM dull pink, OP without erythema LYMPH: no cervical LAD CV: NRRR, normal S1/S2, no murmur, distal pulses 2+ b/l Resp: CTABL, no wheezes, normal WOB Ext: No edema, warm Neuro: Alert and oriented  Assessment & Plan:    Legacie was seen today for cough and chest congestion.  Diagnoses and all orders for this visit:  Acute mucoid otitis media of left ear Start below, symptomatic care discussed. -     azithromycin (ZITHROMAX) 250 MG tablet; Take 2 the first day and then one each day after.  Cough Can cause sleepiness, do not take and drive.  Do not take at the same time as other pain medicine. -     guaiFENesin-codeine 100-10 MG/5ML syrup; Take 5-10 mLs by mouth 3 (three) times daily as needed for cough.  Yeast infection -     fluconazole (DIFLUCAN) 150 MG tablet; Take 1 tablet (150 mg total) by mouth every three (3) days as needed.   Follow up plan: Return if symptoms worsen or fail to improve. Assunta Found, MD [place]

## 2018-07-09 ENCOUNTER — Encounter (INDEPENDENT_AMBULATORY_CARE_PROVIDER_SITE_OTHER): Payer: PPO | Admitting: Pediatrics

## 2018-07-09 DIAGNOSIS — M542 Cervicalgia: Secondary | ICD-10-CM | POA: Diagnosis not present

## 2018-07-09 DIAGNOSIS — M545 Low back pain: Secondary | ICD-10-CM | POA: Diagnosis not present

## 2018-07-09 DIAGNOSIS — M5416 Radiculopathy, lumbar region: Secondary | ICD-10-CM | POA: Diagnosis not present

## 2018-07-09 DIAGNOSIS — M25512 Pain in left shoulder: Secondary | ICD-10-CM | POA: Diagnosis not present

## 2018-07-14 NOTE — Progress Notes (Signed)
error 

## 2018-08-08 ENCOUNTER — Ambulatory Visit (INDEPENDENT_AMBULATORY_CARE_PROVIDER_SITE_OTHER): Payer: PPO | Admitting: Pediatrics

## 2018-08-08 ENCOUNTER — Encounter: Payer: Self-pay | Admitting: Pediatrics

## 2018-08-08 VITALS — BP 104/54 | HR 61 | Temp 99.2°F | Ht 61.0 in | Wt 249.2 lb

## 2018-08-08 DIAGNOSIS — R05 Cough: Secondary | ICD-10-CM | POA: Diagnosis not present

## 2018-08-08 DIAGNOSIS — J40 Bronchitis, not specified as acute or chronic: Secondary | ICD-10-CM

## 2018-08-08 DIAGNOSIS — J069 Acute upper respiratory infection, unspecified: Secondary | ICD-10-CM

## 2018-08-08 DIAGNOSIS — R059 Cough, unspecified: Secondary | ICD-10-CM

## 2018-08-08 MED ORDER — ALBUTEROL SULFATE HFA 108 (90 BASE) MCG/ACT IN AERS: 2.0000 | INHALATION_SPRAY | Freq: Four times a day (QID) | RESPIRATORY_TRACT | 0 refills | Status: DC | PRN

## 2018-08-08 MED ORDER — FLUTICASONE PROPIONATE 50 MCG/ACT NA SUSP: 2.0000 | Freq: Every day | NASAL | 2 refills | Status: DC

## 2018-08-08 MED ORDER — HYDROCODONE-HOMATROPINE 5-1.5 MG/5ML PO SYRP: 5.0000 mL | ORAL_SOLUTION | Freq: Three times a day (TID) | ORAL | 0 refills | Status: DC | PRN

## 2018-08-08 NOTE — Progress Notes (Signed)
  Subjective:   Patient ID: Valerie Baker, female    DOB: [birth year], 62 y.o.   MRN: [id number] CC: Cough (pt here today c/o coughing especially at night and some right ear pain)  HPI: Valerie Baker is a 62 y.o. female   Symptoms started 3 days ago.  No fevers that she knows of.  Cough bothering her the most.  Right ear has some pressure in it.  Appetite slightly down, feeling queasy and slightly nauseated.  No vomiting.  Has run out of Flonase.  Not taking allergy tabs daily.  Sometimes when she starts coughing like this albuterol does help.  Asked for refill.  Relevant past medical, surgical, family and social history reviewed. Allergies and medications reviewed and updated. Social History   Tobacco Use  Smoking Status Former Smoker  . Years: 5.00  . Last attempt to quit: 11/02/2005  . Years since quitting: 12.7  Smokeless Tobacco Never Used   ROS: Per HPI   Objective:    BP (!) 104/54   Pulse 61   Temp 99.2 F (37.3 C) (Oral)   Ht 5\' 1"  (1.549 m)   Wt 249 lb 4 oz (113.1 kg)   BMI 47.10 kg/m   Wt Readings from Last 3 Encounters:  08/08/18 249 lb 4 oz (113.1 kg)  07/03/18 247 lb 12.8 oz (112.4 kg)  03/19/18 233 lb (105.7 kg)    Gen: NAD, alert, cooperative with exam, NCAT EYES: EOMI, no conjunctival injection, or no icterus ENT:  TMs dull gray b/l with clear effusion, OP without erythema LYMPH: no cervical LAD CV: NRRR, normal S1/S2, no murmur, distal pulses 2+ b/l Resp: CTABL, no wheezes, normal WOB Abd: +BS, soft, NTND.  Ext: No pitting edema, warm Neuro: Alert and oriented, strength equal b/l UE and LE, coordination grossly normal MSK: normal muscle bulk  Assessment & Plan:  Valerie Baker was seen today for cough.  Diagnoses and all orders for this visit:  Viral upper respiratory tract infection Symptom care and return precautions discussed. -     fluticasone (FLONASE) 50 MCG/ACT nasal spray; Place 2 sprays into both nostrils daily.  Cough Okay  to take below as needed for cough every 8 hours.  Do not take with pain medicine.  Do not take and drive.  Can cause drowsiness. -     HYDROcodone-homatropine (HYCODAN) 5-1.5 MG/5ML syrup; Take 5 mLs by mouth every 8 (eight) hours as needed for cough.   Follow up plan: Return if symptoms worsen or fail to improve. Assunta Found, MD [place]

## 2018-08-20 ENCOUNTER — Ambulatory Visit (INDEPENDENT_AMBULATORY_CARE_PROVIDER_SITE_OTHER): Payer: PPO | Admitting: Family Medicine

## 2018-08-20 ENCOUNTER — Encounter: Payer: Self-pay | Admitting: Family Medicine

## 2018-08-20 VITALS — BP 174/98 | HR 57 | Temp 98.1°F | Ht 61.0 in | Wt 251.8 lb

## 2018-08-20 DIAGNOSIS — J40 Bronchitis, not specified as acute or chronic: Secondary | ICD-10-CM

## 2018-08-20 MED ORDER — AZITHROMYCIN 250 MG PO TABS: ORAL_TABLET | ORAL | 0 refills | Status: DC
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
## 2018-08-21 ENCOUNTER — Other Ambulatory Visit: Payer: Self-pay | Admitting: Family Medicine

## 2018-08-21 ENCOUNTER — Other Ambulatory Visit: Payer: Self-pay | Admitting: Physician Assistant

## 2018-08-21 DIAGNOSIS — F339 Major depressive disorder, recurrent, unspecified: Secondary | ICD-10-CM

## 2018-09-24 ENCOUNTER — Other Ambulatory Visit: Payer: Self-pay | Admitting: Physician Assistant

## 2018-09-24 ENCOUNTER — Telehealth: Payer: Self-pay | Admitting: Physician Assistant

## 2018-09-24 MED ORDER — CLOTRIMAZOLE-BETAMETHASONE 1-0.05 % EX CREA: 1.0000 "application " | TOPICAL_CREAM | Freq: Two times a day (BID) | CUTANEOUS | 0 refills | Status: DC

## 2018-09-24 NOTE — Telephone Encounter (Signed)
Sent lotrisone

## 2018-09-24 NOTE — Telephone Encounter (Signed)
Left message to call back  

## 2018-09-24 NOTE — Telephone Encounter (Signed)
Patient called stating that she as a yeast infection on right groin area for the last 2-3 days and will like for something to be sent to pharmacy if possible

## 2018-09-25 NOTE — Telephone Encounter (Signed)
Patient aware.

## 2018-10-06 ENCOUNTER — Ambulatory Visit (INDEPENDENT_AMBULATORY_CARE_PROVIDER_SITE_OTHER): Payer: PPO | Admitting: Physician Assistant

## 2018-10-06 ENCOUNTER — Encounter: Payer: Self-pay | Admitting: Physician Assistant

## 2018-10-06 VITALS — BP 125/69 | HR 55 | Temp 97.8°F | Ht 61.0 in | Wt 248.4 lb

## 2018-10-06 DIAGNOSIS — M5137 Other intervertebral disc degeneration, lumbosacral region: Secondary | ICD-10-CM | POA: Diagnosis not present

## 2018-10-06 DIAGNOSIS — F339 Major depressive disorder, recurrent, unspecified: Secondary | ICD-10-CM | POA: Diagnosis not present

## 2018-10-06 DIAGNOSIS — Z01411 Encounter for gynecological examination (general) (routine) with abnormal findings: Secondary | ICD-10-CM | POA: Diagnosis not present

## 2018-10-06 DIAGNOSIS — R350 Frequency of micturition: Secondary | ICD-10-CM

## 2018-10-06 DIAGNOSIS — Z01419 Encounter for gynecological examination (general) (routine) without abnormal findings: Secondary | ICD-10-CM

## 2018-10-06 DIAGNOSIS — M51379 Other intervertebral disc degeneration, lumbosacral region without mention of lumbar back pain or lower extremity pain: Secondary | ICD-10-CM

## 2018-10-06 MED ORDER — OXYCODONE-ACETAMINOPHEN 5-325 MG PO TABS: 1.0000 | ORAL_TABLET | ORAL | 0 refills | Status: DC | PRN

## 2018-10-06 MED ORDER — MELOXICAM 15 MG PO TABS: 15.0000 mg | ORAL_TABLET | Freq: Every day | ORAL | 3 refills | Status: DC

## 2018-10-06 MED ORDER — CITALOPRAM HYDROBROMIDE 20 MG PO TABS: 20.0000 mg | ORAL_TABLET | Freq: Every day | ORAL | 3 refills | Status: DC

## 2018-10-06 MED ORDER — TRAZODONE HCL 100 MG PO TABS: 100.0000 mg | ORAL_TABLET | Freq: Every day | ORAL | 3 refills | Status: DC

## 2018-10-06 MED ORDER — BACLOFEN 10 MG PO TABS: ORAL_TABLET | ORAL | 3 refills | Status: DC

## 2018-10-06 MED ORDER — OXYBUTYNIN CHLORIDE 5 MG PO TABS: 5.0000 mg | ORAL_TABLET | Freq: Two times a day (BID) | ORAL | 3 refills | Status: DC

## 2018-10-06 MED ORDER — OMEPRAZOLE 40 MG PO CPDR: 40.0000 mg | DELAYED_RELEASE_CAPSULE | Freq: Two times a day (BID) | ORAL | 0 refills | Status: DC

## 2018-10-06 MED ORDER — GABAPENTIN 400 MG PO CAPS: 800.0000 mg | ORAL_CAPSULE | Freq: Three times a day (TID) | ORAL | 3 refills | Status: DC

## 2018-10-06 NOTE — Progress Notes (Addendum)
 BP 125/69   Pulse (!) 55   Temp 97.8 F (36.6 C) (Oral)   Ht 5' 1" (1.549 m)   Wt 248 lb 6.4 oz (112.7 kg)   BMI 46.93 kg/m    Subjective:    Patient ID: Valerie Baker, female    DOB: 07/13/1956, 62 y.o.   MRN: [id number]  HPI: Valerie Baker is a 62 y.o. female presenting on 10/06/2018 for Annual Exam  This patient comes in for annual well physical examination. All medications are reviewed today. There are no reports of any problems with the medications. All of the medical conditions are reviewed and updated.  Lab work is reviewed and will be ordered as medically necessary. There are no new problems reported with today's visit.  Patient reports doing well overall. She had a complete hysterectomy and does not need a pelvic exam anymore.  This patient returns for a 6 month recheck on narcotic use for chronic DDD and medication refills  Patient currently taking oxycodone. Behavior- normal Medication side effects- no Any concerns- no  PMP AWARE website reviewed: Yes Any suspicious activity on PMP Aware: No MME daily dose: 15 MME  She takes the medication on a very limited basis. She last filled her med on 06/20/70. She will have flares and will take the medication foe a few weeks and then go a long time without it. She is a very low risk for abuse   Past Medical History:  Diagnosis Date  . Adenomatous colon polyp 06/2006  . Allergy    SINUS  . Anxiety   . Anxiety and depression   . Arthritis   . Asthma   . Bladder incontinence   . Blood transfusion   . Cataract   . Depression   . Fibromyalgia   . GERD (gastroesophageal reflux disease)   . Hyperlipidemia   . Hypertension   . IBS (irritable bowel syndrome)   . Kidney stones   . Migraines   . Obesity   . Osteopenia    Relevant past medical, surgical, family and social history reviewed and updated as indicated. Interim medical history since our last visit reviewed. Allergies and medications reviewed and  updated. DATA REVIEWED: CHART IN EPIC  Family History reviewed for pertinent findings.  Review of Systems  Constitutional: Positive for fatigue.  HENT: Negative.   Eyes: Negative.   Respiratory: Negative.   Gastrointestinal: Negative.   Genitourinary: Negative.   Musculoskeletal: Positive for arthralgias, back pain and myalgias.    Allergies as of 10/06/2018      Reactions   Sulfonamide Derivatives Rash      Medication List       Accurate as of October 06, 2018  9:17 PM. Always use your most recent med list.        albuterol 108 (90 Base) MCG/ACT inhaler Commonly known as:  PROVENTIL HFA;VENTOLIN HFA Inhale 2 puffs into the lungs every 6 (six) hours as needed for wheezing or shortness of breath.   augmented betamethasone dipropionate 0.05 % cream Commonly known as:  DIPROLENE-AF Apply topically 2 (two) times daily.   baclofen 10 MG tablet Commonly known as:  LIORESAL Take 1 tablet by mouth 3 times a day as needed for muscle spasms   betamethasone valerate 0.1 % cream Commonly known as:  VALISONE Apply topically 2 (two) times daily.   CALCIUM 600+D 600-400 MG-UNIT tablet Generic drug:  Calcium Carbonate-Vitamin D Take 1 tablet by mouth 2 (two) times daily.   citalopram 20   MG tablet Commonly known as:  CELEXA Take 1 tablet (20 mg total) by mouth daily.   clotrimazole-betamethasone cream Commonly known as:  LOTRISONE Apply 1 application topically 2 (two) times daily.   diclofenac sodium 1 % Gel Commonly known as:  VOLTAREN APPLY 4 GRAMS 4 TIMES DAILY AS NEEDED   fluocinonide gel 0.05 % Commonly known as:  LIDEX Apply topically 2 (two) times daily.   fluticasone 50 MCG/ACT nasal spray Commonly known as:  FLONASE Place 2 sprays into both nostrils daily.   gabapentin 400 MG capsule Commonly known as:  NEURONTIN Take 2 capsules (800 mg total) by mouth 3 (three) times daily.   loratadine 10 MG tablet Commonly known as:  CLARITIN Take 10 mg by mouth  daily as needed for allergies.   meloxicam 15 MG tablet Commonly known as:  MOBIC Take 1 tablet (15 mg total) by mouth daily.   multivitamin tablet Take 1 tablet by mouth daily.   omeprazole 40 MG capsule Commonly known as:  PRILOSEC Take 1 capsule (40 mg total) by mouth 2 (two) times daily. (Needs to be seen before next refill)   oxybutynin 5 MG tablet Commonly known as:  DITROPAN Take 1 tablet (5 mg total) by mouth 2 (two) times daily.   oxyCODONE-acetaminophen 5-325 MG tablet Commonly known as:  PERCOCET/ROXICET Take 1 tablet by mouth every 4 (four) hours as needed for severe pain.   oxyCODONE-acetaminophen 5-325 MG tablet Commonly known as:  ROXICET Take 1 tablet by mouth every 4 (four) hours as needed for severe pain.   oxyCODONE-acetaminophen 5-325 MG tablet Commonly known as:  ROXICET Take 1 tablet by mouth every 4 (four) hours as needed for severe pain.   traZODone 100 MG tablet Commonly known as:  DESYREL Take 1 tablet (100 mg total) by mouth at bedtime.   triamcinolone cream 0.1 % Commonly known as:  KENALOG Apply topically 2 (two) times daily.   Vitamin D3 50 MCG (2000 UT) Tabs Take 1 tablet by mouth daily.          Objective:    BP 125/69   Pulse (!) 55   Temp 97.8 F (36.6 C) (Oral)   Ht 5' 1" (1.549 m)   Wt 248 lb 6.4 oz (112.7 kg)   BMI 46.93 kg/m   Allergies  Allergen Reactions  . Sulfonamide Derivatives Rash    Wt Readings from Last 3 Encounters:  10/06/18 248 lb 6.4 oz (112.7 kg)  08/20/18 251 lb 12.8 oz (114.2 kg)  08/08/18 249 lb 4 oz (113.1 kg)    Physical Exam Constitutional:      Appearance: She is well-developed.  HENT:     Head: Normocephalic and atraumatic.  Eyes:     Conjunctiva/sclera: Conjunctivae normal.     Pupils: Pupils are equal, round, and reactive to light.  Neck:     Musculoskeletal: Normal range of motion and neck supple.  Cardiovascular:     Rate and Rhythm: Normal rate and regular rhythm.     Heart  sounds: Normal heart sounds.  Pulmonary:     Effort: Pulmonary effort is normal.     Breath sounds: Normal breath sounds.  Chest:     Breasts: Breasts are symmetrical.        Right: No mass, skin change or tenderness.        Left: No mass, skin change or tenderness.  Abdominal:     General: Bowel sounds are normal.     Palpations: Abdomen is soft.    Genitourinary:    Labia:        Right: No tenderness or lesion.        Left: No tenderness or lesion.      Vagina: Normal. No vaginal discharge, tenderness or bleeding.     Uterus: Absent.      Adnexa:        Right: No mass, tenderness or fullness.         Left: No mass, tenderness or fullness.       Rectum: No anal fissure.  Skin:    General: Skin is warm and dry.     Findings: No rash.  Neurological:     Mental Status: She is alert and oriented to person, place, and time.     Deep Tendon Reflexes: Reflexes are normal and symmetric.  Psychiatric:        Behavior: Behavior normal.        Thought Content: Thought content normal.        Judgment: Judgment normal.     Results for orders placed or performed in visit on 03/19/18  Urine Culture  Result Value Ref Range   Urine Culture, Routine Final report (A)    Organism ID, Bacteria Enterococcus faecalis (A)    ORGANISM ID, BACTERIA Morganella morganii (A)    Antimicrobial Susceptibility Comment   Microscopic Examination  Result Value Ref Range   WBC, UA 0-5 0 - 5 /hpf   RBC, UA 0-2 0 - 2 /hpf   Epithelial Cells (non renal) 0-10 0 - 10 /hpf   Renal Epithel, UA None seen None seen /hpf   Bacteria, UA None seen None seen/Few  Urinalysis, Complete  Result Value Ref Range   Specific Gravity, UA 1.020 1.005 - 1.030   pH, UA 6.5 5.0 - 7.5   Color, UA Yellow Yellow   Appearance Ur Clear Clear   Leukocytes, UA Negative Negative   Protein, UA Negative Negative/Trace   Glucose, UA Negative Negative   Ketones, UA Negative Negative   RBC, UA Negative Negative   Bilirubin, UA  Negative Negative   Urobilinogen, Ur 0.2 0.2 - 1.0 mg/dL   Nitrite, UA Negative Negative   Microscopic Examination See below:       Assessment & Plan:   1. Well woman exam - CBC with Differential/Platelet; Future - CMP14+EGFR; Future - Lipid panel; Future - TSH; Future NO future pelvic exam is needed.   2. Disc degeneration, lumbosacral - gabapentin (NEURONTIN) 400 MG capsule; Take 2 capsules (800 mg total) by mouth 3 (three) times daily.  Dispense: 540 capsule; Refill: 3 - meloxicam (MOBIC) 15 MG tablet; Take 1 tablet (15 mg total) by mouth daily.  Dispense: 90 tablet; Refill: 3 - oxyCODONE-acetaminophen (PERCOCET/ROXICET) 5-325 MG tablet; Take 1 tablet by mouth every 4 (four) hours as needed for severe pain.  Dispense: 60 tablet; Refill: 0 - oxyCODONE-acetaminophen (ROXICET) 5-325 MG tablet; Take 1 tablet by mouth every 4 (four) hours as needed for severe pain.  Dispense: 60 tablet; Refill: 0  3. Depression, recurrent ([place]) - citalopram (CELEXA) 20 MG tablet; Take 1 tablet (20 mg total) by mouth daily.  Dispense: 90 tablet; Refill: 3 - traZODone (DESYREL) 100 MG tablet; Take 1 tablet (100 mg total) by mouth at bedtime.  Dispense: 90 tablet; Refill: 3  4. Urinary frequency - oxybutynin (DITROPAN) 5 MG tablet; Take 1 tablet (5 mg total) by mouth 2 (two) times daily.  Dispense: 180 tablet; Refill: 3   Continue all other maintenance medications  as listed above.  Follow up plan: Return in about 6 months (around 04/06/2019) for recheck meds.  Educational handout given for health maintenance  Angel S. Jones PA-C [facility] [address]  [place], [place] [address] [phone]   10/06/2018, 9:17 PM  

## 2018-10-10 ENCOUNTER — Other Ambulatory Visit: Payer: Self-pay | Admitting: *Deleted

## 2018-10-10 MED ORDER — OMEPRAZOLE 40 MG PO CPDR: 40.0000 mg | DELAYED_RELEASE_CAPSULE | Freq: Two times a day (BID) | ORAL | 1 refills | Status: DC

## 2018-10-14 ENCOUNTER — Other Ambulatory Visit: Payer: PPO

## 2018-10-14 DIAGNOSIS — Z01419 Encounter for gynecological examination (general) (routine) without abnormal findings: Secondary | ICD-10-CM | POA: Diagnosis not present

## 2018-10-15 LAB — CBC WITH DIFFERENTIAL/PLATELET
BASOS: 2 %
Basophils Absolute: 0.1 10*3/uL (ref 0.0–0.2)
EOS (ABSOLUTE): 0.5 10*3/uL — AB (ref 0.0–0.4)
Eos: 9 %
Hematocrit: 35.5 % (ref 34.0–46.6)
Hemoglobin: 12.3 g/dL (ref 11.1–15.9)
Immature Grans (Abs): 0 10*3/uL (ref 0.0–0.1)
Immature Granulocytes: 0 %
Lymphocytes Absolute: 1.9 10*3/uL (ref 0.7–3.1)
Lymphs: 39 %
MCH: 31.4 pg (ref 26.6–33.0)
MCHC: 34.6 g/dL (ref 31.5–35.7)
MCV: 91 fL (ref 79–97)
Monocytes Absolute: 0.5 10*3/uL (ref 0.1–0.9)
Monocytes: 10 %
Neutrophils Absolute: 2.1 10*3/uL (ref 1.4–7.0)
Neutrophils: 40 %
Platelets: 178 10*3/uL (ref 150–450)
RBC: 3.92 x10E6/uL (ref 3.77–5.28)
RDW: 12.8 % (ref 11.7–15.4)
WBC: 5 10*3/uL (ref 3.4–10.8)

## 2018-10-15 LAB — CMP14+EGFR
ALT: 18 IU/L (ref 0–32)
AST: 22 IU/L (ref 0–40)
Albumin/Globulin Ratio: 1.8 (ref 1.2–2.2)
Albumin: 4 g/dL (ref 3.8–4.8)
Alkaline Phosphatase: 101 IU/L (ref 39–117)
BILIRUBIN TOTAL: 0.3 mg/dL (ref 0.0–1.2)
BUN/Creatinine Ratio: 21 (ref 12–28)
BUN: 12 mg/dL (ref 8–27)
CO2: 26 mmol/L (ref 20–29)
Calcium: 9.4 mg/dL (ref 8.7–10.3)
Chloride: 102 mmol/L (ref 96–106)
Creatinine, Ser: 0.57 mg/dL (ref 0.57–1.00)
GFR calc Af Amer: 115 mL/min/{1.73_m2} (ref >59)
GFR calc non Af Amer: 100 mL/min/{1.73_m2} (ref >59)
Globulin, Total: 2.2 g/dL (ref 1.5–4.5)
Glucose: 82 mg/dL (ref 65–99)
POTASSIUM: 4.2 mmol/L (ref 3.5–5.2)
Sodium: 143 mmol/L (ref 134–144)
Total Protein: 6.2 g/dL (ref 6.0–8.5)

## 2018-10-15 LAB — LIPID PANEL
Chol/HDL Ratio: 3.2 ratio (ref 0.0–4.4)
Cholesterol, Total: 201 mg/dL — ABNORMAL HIGH (ref 100–199)
HDL: 62 mg/dL (ref >39)
LDL Calculated: 113 mg/dL — ABNORMAL HIGH (ref 0–99)
TRIGLYCERIDES: 132 mg/dL (ref 0–149)
VLDL Cholesterol Cal: 26 mg/dL (ref 5–40)

## 2018-10-15 LAB — TSH: TSH: 2.25 u[IU]/mL (ref 0.450–4.500)

## 2018-11-14 ENCOUNTER — Telehealth: Payer: Self-pay | Admitting: *Deleted

## 2018-11-14 DIAGNOSIS — R350 Frequency of micturition: Secondary | ICD-10-CM

## 2018-11-14 DIAGNOSIS — M5137 Other intervertebral disc degeneration, lumbosacral region: Secondary | ICD-10-CM

## 2018-11-14 DIAGNOSIS — F339 Major depressive disorder, recurrent, unspecified: Secondary | ICD-10-CM

## 2018-11-14 MED ORDER — CITALOPRAM HYDROBROMIDE 20 MG PO TABS: 20.0000 mg | ORAL_TABLET | Freq: Every day | ORAL | 2 refills | Status: DC

## 2018-11-14 MED ORDER — OXYBUTYNIN CHLORIDE 5 MG PO TABS: 5.0000 mg | ORAL_TABLET | Freq: Two times a day (BID) | ORAL | 2 refills | Status: DC

## 2018-11-14 MED ORDER — GABAPENTIN 400 MG PO CAPS: 800.0000 mg | ORAL_CAPSULE | Freq: Three times a day (TID) | ORAL | 2 refills | Status: DC

## 2018-11-14 MED ORDER — OMEPRAZOLE 40 MG PO CPDR: 40.0000 mg | DELAYED_RELEASE_CAPSULE | Freq: Two times a day (BID) | ORAL | 0 refills | Status: DC

## 2018-11-14 MED ORDER — MELOXICAM 15 MG PO TABS: 15.0000 mg | ORAL_TABLET | Freq: Every day | ORAL | 2 refills | Status: DC

## 2018-11-14 MED ORDER — TRAZODONE HCL 100 MG PO TABS: 100.0000 mg | ORAL_TABLET | Freq: Every day | ORAL | 3 refills | Status: DC

## 2018-11-14 MED ORDER — BACLOFEN 10 MG PO TABS: ORAL_TABLET | ORAL | 2 refills | Status: DC

## 2018-11-14 NOTE — Telephone Encounter (Signed)
Pt's insurance changed as of 11/12/18, new mail order is [facility], sent her 7 medications in

## 2018-12-18 DIAGNOSIS — G5761 Lesion of plantar nerve, right lower limb: Secondary | ICD-10-CM | POA: Diagnosis not present

## 2018-12-18 DIAGNOSIS — G5762 Lesion of plantar nerve, left lower limb: Secondary | ICD-10-CM | POA: Diagnosis not present

## 2018-12-18 DIAGNOSIS — M79671 Pain in right foot: Secondary | ICD-10-CM | POA: Diagnosis not present

## 2018-12-18 DIAGNOSIS — M79672 Pain in left foot: Secondary | ICD-10-CM | POA: Diagnosis not present

## 2018-12-18 DIAGNOSIS — M722 Plantar fascial fibromatosis: Secondary | ICD-10-CM | POA: Diagnosis not present

## 2019-01-19 DIAGNOSIS — M5416 Radiculopathy, lumbar region: Secondary | ICD-10-CM | POA: Diagnosis not present

## 2019-01-19 DIAGNOSIS — M4326 Fusion of spine, lumbar region: Secondary | ICD-10-CM | POA: Diagnosis not present

## 2019-01-19 DIAGNOSIS — M25552 Pain in left hip: Secondary | ICD-10-CM | POA: Diagnosis not present

## 2019-02-03 ENCOUNTER — Other Ambulatory Visit: Payer: Self-pay | Admitting: Physician Assistant

## 2019-03-09 ENCOUNTER — Other Ambulatory Visit: Payer: Self-pay | Admitting: Physician Assistant

## 2019-03-09 DIAGNOSIS — M5137 Other intervertebral disc degeneration, lumbosacral region: Secondary | ICD-10-CM

## 2019-03-26 ENCOUNTER — Encounter: Payer: Self-pay | Admitting: Family Medicine

## 2019-03-26 ENCOUNTER — Other Ambulatory Visit: Payer: Self-pay

## 2019-03-26 ENCOUNTER — Ambulatory Visit (INDEPENDENT_AMBULATORY_CARE_PROVIDER_SITE_OTHER): Payer: Medicare Other | Admitting: Family Medicine

## 2019-03-26 DIAGNOSIS — R42 Dizziness and giddiness: Secondary | ICD-10-CM | POA: Diagnosis not present

## 2019-03-26 MED ORDER — MECLIZINE HCL 12.5 MG PO TABS: 12.5000 mg | ORAL_TABLET | Freq: Three times a day (TID) | ORAL | 0 refills | Status: DC | PRN

## 2019-03-26 MED ORDER — ONDANSETRON HCL 4 MG PO TABS: 4.0000 mg | ORAL_TABLET | Freq: Three times a day (TID) | ORAL | 0 refills | Status: DC | PRN

## 2019-03-26 NOTE — Progress Notes (Signed)
Virtual Visit via telephone Note Due to COVID-19 pandemic this visit was conducted virtually. This visit type was conducted due to national recommendations for restrictions regarding the COVID-19 Pandemic (e.g. social distancing, sheltering in place) in an effort to limit this patient's exposure and mitigate transmission in our community. All issues noted in this document were discussed and addressed.  A physical exam was not performed with this format.   I connected with Valerie Baker on 03/26/19 at 0855 by telephone and verified that I am speaking with the correct person using two identifiers. Valerie Baker is currently located at home and family is currently with them during visit. The provider, Monia Pouch, FNP is located in their office at time of visit.  I discussed the limitations, risks, security and privacy concerns of performing an evaluation and management service by telephone and the availability of in person appointments. I also discussed with the patient that there may be a patient responsible charge related to this service. The patient expressed understanding and agreed to proceed.  Subjective:  Patient ID: Valerie Baker, female    DOB: 20-Oct-1955, 63 y.o.   MRN: [id number]  Chief Complaint:  Dizziness   HPI: Valerie Baker is a 63 y.o. female presenting on 03/26/2019 for Dizziness   Pt reports dizziness with standing and position changes. States this started on Monday. States she only has the dizziness when she goes from a sitting to standing position or with sudden movements. States she has had inner ear trouble in the past and this is the same. She complains of nausea with the dizziness. No focal deficits, weakness, slurred speech, facial droop, confusion, or loss of function. No chest pain, shortness of breath, palpitations, or syncope.   Dizziness This is a recurrent problem. The current episode started in the past 7 days. The problem occurs  intermittently. The problem has been waxing and waning. Associated symptoms include nausea and vertigo. Pertinent negatives include no abdominal pain, anorexia, arthralgias, change in bowel habit, chest pain, chills, congestion, coughing, diaphoresis, fatigue, fever, headaches, joint swelling, myalgias, neck pain, numbness, rash, sore throat, swollen glands, urinary symptoms, visual change, vomiting or weakness. The symptoms are aggravated by standing and twisting. She has tried nothing for the symptoms.     Relevant past medical, surgical, family, and social history reviewed and updated as indicated.  Allergies and medications reviewed and updated.   Past Medical History:  Diagnosis Date  . Adenomatous colon polyp 06/2006  . Allergy    SINUS  . Anxiety   . Anxiety and depression   . Arthritis   . Asthma   . Bladder incontinence   . Blood transfusion   . Cataract   . Depression   . Fibromyalgia   . GERD (gastroesophageal reflux disease)   . Hyperlipidemia   . Hypertension   . IBS (irritable bowel syndrome)   . Kidney stones   . Migraines   . Obesity   . Osteopenia     Past Surgical History:  Procedure Laterality Date  . ABDOMINAL HYSTERECTOMY    . APPENDECTOMY    . BACK SURGERY  2000  . DEBRIDEMENT TENNIS ELBOW    . ELBOW SURGERY  2003  . EYE SURGERY    . [place]   right  . LUMBAR FUSION  2007  . LUMBAR FUSION  2002,2003,2007,2008  . NASAL SINUS SURGERY  2003  . NISSEN FUNDOPLICATION  0086  . ROTATOR CUFF REPAIR  [id number]  .  SPINAL CORD STIMULATOR IMPLANT  2005  . TOTAL KNEE ARTHROPLASTY  2008,2009    Social History   Socioeconomic History  . Marital status: Married    Spouse name: Not on file  . Number of children: 3  . Years of education: Not on file  . Highest education level: Not on file  Occupational History  . Occupation: Disabled  Social Needs  . Financial resource strain: Not hard at all  . Food insecurity    Worry: Never true     Inability: Never true  . Transportation needs    Medical: No    Non-medical: No  Tobacco Use  . Smoking status: Former Smoker    Years: 5.00    Quit date: 11/02/2005    Years since quitting: 13.4  . Smokeless tobacco: Never Used  Substance and Sexual Activity  . Alcohol use: No  . Drug use: No  . Sexual activity: Yes  Lifestyle  . Physical activity    Days per week: 0 days    Minutes per session: Not on file  . Stress: Not at all  Relationships  . Social connections    Talks on phone: More than three times a week    Gets together: More than three times a week    Attends religious service: More than 4 times per year    Active member of club or organization: Yes    Attends meetings of clubs or organizations: More than 4 times per year    Relationship status: Married  . Intimate partner violence    Fear of current or ex partner: Not on file    Emotionally abused: Not on file    Physically abused: Not on file    Forced sexual activity: Not on file  Other Topics Concern  . Not on file  Social History Narrative  . Not on file    Outpatient Encounter Medications as of 03/26/2019  Medication Sig  . albuterol (PROVENTIL HFA;VENTOLIN HFA) 108 (90 Base) MCG/ACT inhaler Inhale 2 puffs into the lungs every 6 (six) hours as needed for wheezing or shortness of breath.  Marland Kitchen augmented betamethasone dipropionate (DIPROLENE-AF) 0.05 % cream Apply topically 2 (two) times daily.  . baclofen (LIORESAL) 10 MG tablet Take 1 tablet by mouth 3 times a day as needed for muscle spasms  . betamethasone valerate (VALISONE) 0.1 % cream Apply topically 2 (two) times daily.  . Calcium Carbonate-Vitamin D (CALCIUM 600+D) 600-400 MG-UNIT per tablet Take 1 tablet by mouth 2 (two) times daily.    . Cholecalciferol (VITAMIN D3) 2000 UNITS TABS Take 1 tablet by mouth daily.    . citalopram (CELEXA) 20 MG tablet Take 1 tablet (20 mg total) by mouth daily.  . clotrimazole-betamethasone (LOTRISONE) cream Apply 1  application topically 2 (two) times daily.  . diclofenac sodium (VOLTAREN) 1 % GEL APPLY 4 GRAMS 4 TIMES DAILY AS NEEDED  . fluocinonide gel (LIDEX) 0.05 % Apply topically 2 (two) times daily.  . fluticasone (FLONASE) 50 MCG/ACT nasal spray Place 2 sprays into both nostrils daily.  Marland Kitchen gabapentin (NEURONTIN) 400 MG capsule Take 2 capsules (800 mg total) by mouth 3 (three) times daily.  Marland Kitchen loratadine (CLARITIN) 10 MG tablet Take 10 mg by mouth daily as needed for allergies.  Marland Kitchen meclizine (ANTIVERT) 12.5 MG tablet Take 1 tablet (12.5 mg total) by mouth 3 (three) times daily as needed for dizziness.  . meloxicam (MOBIC) 15 MG tablet Take 1 tablet (15 mg total) by mouth daily.  Marland Kitchen  Multiple Vitamin (MULTIVITAMIN) tablet Take 1 tablet by mouth daily.   Marland Kitchen omeprazole (PRILOSEC) 40 MG capsule TAKE 1 CAPSULE BY MOUTH  TWICE DAILY  . ondansetron (ZOFRAN) 4 MG tablet Take 1 tablet (4 mg total) by mouth every 8 (eight) hours as needed for nausea or vomiting.  Marland Kitchen oxybutynin (DITROPAN) 5 MG tablet Take 1 tablet (5 mg total) by mouth 2 (two) times daily.  Marland Kitchen oxyCODONE-acetaminophen (PERCOCET/ROXICET) 5-325 MG tablet Take 1 tablet by mouth every 4 (four) hours as needed for severe pain.  Marland Kitchen oxyCODONE-acetaminophen (ROXICET) 5-325 MG tablet Take 1 tablet by mouth every 4 (four) hours as needed for severe pain.  Marland Kitchen oxyCODONE-acetaminophen (ROXICET) 5-325 MG tablet Take 1 tablet by mouth every 4 (four) hours as needed for severe pain.  . traZODone (DESYREL) 100 MG tablet Take 1 tablet (100 mg total) by mouth at bedtime.  . triamcinolone cream (KENALOG) 0.1 % Apply topically 2 (two) times daily.   No facility-administered encounter medications on file as of 03/26/2019.     Allergies  Allergen Reactions  . Sulfonamide Derivatives Rash    Review of Systems  Constitutional: Negative for activity change, appetite change, chills, diaphoresis, fatigue, fever and unexpected weight change.  HENT: Negative for congestion and  sore throat.   Eyes: Negative for photophobia and visual disturbance.  Respiratory: Negative for cough, chest tightness and shortness of breath.   Cardiovascular: Negative for chest pain and palpitations.  Gastrointestinal: Positive for nausea. Negative for abdominal pain, anal bleeding, anorexia, blood in stool, change in bowel habit and vomiting.  Genitourinary: Negative for decreased urine volume, difficulty urinating and hematuria.  Musculoskeletal: Negative for arthralgias, gait problem, joint swelling, myalgias, neck pain and neck stiffness.  Skin: Negative for color change, pallor and rash.  Neurological: Positive for dizziness and vertigo. Negative for tremors, seizures, syncope, facial asymmetry, speech difficulty, weakness, light-headedness, numbness and headaches.  Hematological: Does not bruise/bleed easily.  Psychiatric/Behavioral: Negative for confusion.  All other systems reviewed and are negative.        Observations/Objective: No vital signs or physical exam, this was a telephone or virtual health encounter.  Pt alert and oriented, answers all questions appropriately, and able to speak in full sentences.    Assessment and Plan: Valerie Baker was seen today for dizziness.  Diagnoses and all orders for this visit:  Vertigo Reported symptoms consistent with BPPV. No red flags concerning for acute neurovascular event. Symptomatic care discussed. Will trial below. Pt aware to make slow position changes. Increase water intake. Report any new or worsening symptoms. Pt aware of symptoms that require emergent evaluation and treatment. Follow up in 2-4 weeks or sooner if needed.  -     ondansetron (ZOFRAN) 4 MG tablet; Take 1 tablet (4 mg total) by mouth every 8 (eight) hours as needed for nausea or vomiting. -     meclizine (ANTIVERT) 12.5 MG tablet; Take 1 tablet (12.5 mg total) by mouth 3 (three) times daily as needed for dizziness.     Follow Up Instructions: Return in about  2 weeks (around 04/09/2019), or if symptoms worsen or fail to improve, for dizziness.    I discussed the assessment and treatment plan with the patient. The patient was provided an opportunity to ask questions and all were answered. The patient agreed with the plan and demonstrated an understanding of the instructions.   The patient was advised to call back or seek an in-person evaluation if the symptoms worsen or if the condition fails to  improve as anticipated.  The above assessment and management plan was discussed with the patient. The patient verbalized understanding of and has agreed to the management plan. Patient is aware to call the clinic if symptoms persist or worsen. Patient is aware when to return to the clinic for a follow-up visit. Patient educated on when it is appropriate to go to the emergency department.    I provided 15 minutes of non-face-to-face time during this encounter. The call started at 0855. The call ended at 0910. The other time was used for coordination of care.    Monia Pouch, FNP-C [facility] [address] [place], [place] [address] [phone] 03/26/19

## 2019-04-03 ENCOUNTER — Telehealth: Payer: Self-pay | Admitting: Physician Assistant

## 2019-04-03 ENCOUNTER — Other Ambulatory Visit: Payer: Self-pay

## 2019-04-06 ENCOUNTER — Other Ambulatory Visit: Payer: Self-pay

## 2019-04-06 ENCOUNTER — Encounter: Payer: Self-pay | Admitting: Physician Assistant

## 2019-04-06 ENCOUNTER — Ambulatory Visit (INDEPENDENT_AMBULATORY_CARE_PROVIDER_SITE_OTHER): Payer: Medicare Other | Admitting: Physician Assistant

## 2019-04-06 VITALS — BP 159/76 | HR 55 | Temp 96.6°F | Ht 61.0 in | Wt 241.0 lb

## 2019-04-06 DIAGNOSIS — K219 Gastro-esophageal reflux disease without esophagitis: Secondary | ICD-10-CM

## 2019-04-06 DIAGNOSIS — H6983 Other specified disorders of Eustachian tube, bilateral: Secondary | ICD-10-CM | POA: Diagnosis not present

## 2019-04-06 DIAGNOSIS — M5137 Other intervertebral disc degeneration, lumbosacral region: Secondary | ICD-10-CM

## 2019-04-06 MED ORDER — OXYCODONE-ACETAMINOPHEN 5-325 MG PO TABS: 1.0000 | ORAL_TABLET | ORAL | 0 refills | Status: DC | PRN

## 2019-04-06 MED ORDER — FLUTICASONE PROPIONATE 50 MCG/ACT NA SUSP: 2.0000 | Freq: Every day | NASAL | 2 refills | Status: DC

## 2019-04-06 MED ORDER — OMEPRAZOLE 40 MG PO CPDR: 40.0000 mg | DELAYED_RELEASE_CAPSULE | Freq: Two times a day (BID) | ORAL | 3 refills | Status: DC

## 2019-04-06 NOTE — Progress Notes (Signed)
BP (!) 159/76    Pulse (!) 55    Temp (!) 96.6 F (35.9 C) (Temporal)    Ht _0  (1.549 m)    Wt 241 lb (109.3 kg)    BMI 45.54 kg/m    Subjective:    Patient ID: Valerie Baker, female    DOB: [birth year], 63 y.o.   MRN: [id number]  HPI: Valerie Baker is a 63 y.o. female presenting on 04/06/2019 for Hypertension (6 month follow up ) and Medical Management of Chronic Issues  This patient comes in for recheck on her chronic medical conditions.  She has had longstanding degenerative disc disease and arthritis.  She does not use her pain medicine on a very chronic and high basis however she does get it filled on a regular basis.  And this was also included with visit today.  She is having some ear fullness that is more in the in the head.  She does not have any congestion or cough.  No fever or chills.  She states she is not dizzy.  She has had eustachian tube dysfunction in the past.  She is not using her Flonase on a regular basis and does need a refill.  In addition her medication needs to be refilled.  She states that she is well controlled as far as that goes.  She needs a refill sent in to her mail-in pharmacy.  PAIN MANAGEMENT: This patient returns for a 6 month recheck on narcotic use for chronic DDD and multiple joint arthrtis   Patient currently taking  Percocet 5 mg 1 tablet up to every 4 hours as needed for severe pain.  She will have bad days where she will take it more regularly but for the most part tries to take it very judiciously and some days not at all.. Behavior- normal Medication side effects- no Any concerns- no  PMP AWARE website reviewed: Yes Any suspicious activity on PMP Aware: No MME daily dose: 15 MME  She takes the medication on a very limited basis.  Her last fill date was 03/09/2019.  Past Medical History:  Diagnosis Date   Adenomatous colon polyp 06/2006   Allergy    SINUS   Anxiety    Anxiety and depression    Arthritis     Asthma    Bladder incontinence    Blood transfusion    Cataract    Depression    Fibromyalgia    GERD (gastroesophageal reflux disease)    Hyperlipidemia    Hypertension    IBS (irritable bowel syndrome)    Kidney stones    Migraines    Obesity    Osteopenia    Relevant past medical, surgical, family and social history reviewed and updated as indicated. Interim medical history since our last visit reviewed. Allergies and medications reviewed and updated. DATA REVIEWED: CHART IN EPIC  Family History reviewed for pertinent findings.  Review of Systems  Constitutional: Negative.   HENT: Negative.   Eyes: Negative.   Respiratory: Negative.   Gastrointestinal: Negative.   Genitourinary: Negative.     Allergies as of 04/06/2019      Reactions   Sulfonamide Derivatives Rash      Medication List       Accurate as of April 06, 2019 10:09 AM. If you have any questions, ask your nurse or doctor.        albuterol 108 (90 Base) MCG/ACT inhaler Commonly known as: VENTOLIN HFA Inhale 2 puffs into the  lungs every 6 (six) hours as needed for wheezing or shortness of breath.   augmented betamethasone dipropionate 0.05 % cream Commonly known as: DIPROLENE-AF Apply topically 2 (two) times daily.   baclofen 10 MG tablet Commonly known as: LIORESAL Take 1 tablet by mouth 3 times a day as needed for muscle spasms   betamethasone valerate 0.1 % cream Commonly known as: VALISONE Apply topically 2 (two) times daily.   Calcium 600+D 600-400 MG-UNIT tablet Generic drug: Calcium Carbonate-Vitamin D Take 1 tablet by mouth 2 (two) times daily.   citalopram 20 MG tablet Commonly known as: CELEXA Take 1 tablet (20 mg total) by mouth daily.   clotrimazole-betamethasone cream Commonly known as: LOTRISONE Apply 1 application topically 2 (two) times daily.   diclofenac sodium 1 % Gel Commonly known as: VOLTAREN APPLY 4 GRAMS 4 TIMES DAILY AS NEEDED   fluocinonide gel  0.05 % Commonly known as: LIDEX Apply topically 2 (two) times daily.   fluticasone 50 MCG/ACT nasal spray Commonly known as: FLONASE Place 2 sprays into both nostrils daily.   gabapentin 400 MG capsule Commonly known as: Neurontin Take 2 capsules (800 mg total) by mouth 3 (three) times daily.   loratadine 10 MG tablet Commonly known as: CLARITIN Take 10 mg by mouth daily as needed for allergies.   meclizine 12.5 MG tablet Commonly known as: ANTIVERT Take 1 tablet (12.5 mg total) by mouth 3 (three) times daily as needed for dizziness.   meloxicam 15 MG tablet Commonly known as: MOBIC Take 1 tablet (15 mg total) by mouth daily.   multivitamin tablet Take 1 tablet by mouth daily.   omeprazole 40 MG capsule Commonly known as: PRILOSEC Take 1 capsule (40 mg total) by mouth 2 (two) times daily.   ondansetron 4 MG tablet Commonly known as: ZOFRAN Take 1 tablet (4 mg total) by mouth every 8 (eight) hours as needed for nausea or vomiting.   oxybutynin 5 MG tablet Commonly known as: DITROPAN Take 1 tablet (5 mg total) by mouth 2 (two) times daily.   oxyCODONE-acetaminophen 5-325 MG tablet Commonly known as: PERCOCET/ROXICET Take 1 tablet by mouth every 4 (four) hours as needed for severe pain.   oxyCODONE-acetaminophen 5-325 MG tablet Commonly known as: Roxicet Take 1 tablet by mouth every 4 (four) hours as needed for severe pain.   oxyCODONE-acetaminophen 5-325 MG tablet Commonly known as: Roxicet Take 1 tablet by mouth every 4 (four) hours as needed for severe pain.   traZODone 100 MG tablet Commonly known as: DESYREL Take 1 tablet (100 mg total) by mouth at bedtime.   triamcinolone cream 0.1 % Commonly known as: KENALOG Apply topically 2 (two) times daily.   Vitamin D3 50 MCG (2000 UT) Tabs Take 1 tablet by mouth daily.          Objective:    BP (!) 159/76    Pulse (!) 55    Temp (!) 96.6 F (35.9 C) (Temporal)    Ht _0  (1.549 m)    Wt 241 lb (109.3  kg)    BMI 45.54 kg/m   Allergies  Allergen Reactions   Sulfonamide Derivatives Rash    Wt Readings from Last 3 Encounters:  04/06/19 241 lb (109.3 kg)  10/06/18 248 lb 6.4 oz (112.7 kg)  08/20/18 251 lb 12.8 oz (114.2 kg)    Physical Exam Constitutional:      General: She is not in acute distress.    Appearance: Normal appearance. She is well-developed.  HENT:  Head: Normocephalic and atraumatic.  Cardiovascular:     Rate and Rhythm: Normal rate.  Pulmonary:     Effort: Pulmonary effort is normal.  Skin:    General: Skin is warm and dry.     Findings: No rash.  Neurological:     Mental Status: She is alert and oriented to person, place, and time.     Deep Tendon Reflexes: Reflexes are normal and symmetric.     Results for orders placed or performed in visit on 10/14/18  TSH  Result Value Ref Range   TSH 2.250 0.450 - 4.500 uIU/mL  Lipid panel  Result Value Ref Range   Cholesterol, Total 201 (H) 100 - 199 mg/dL   Triglycerides 132 0 - 149 mg/dL   HDL 62 >39 mg/dL   VLDL Cholesterol Cal 26 5 - 40 mg/dL   LDL Calculated 113 (H) 0 - 99 mg/dL   Chol/HDL Ratio 3.2 0.0 - 4.4 ratio  CMP14+EGFR  Result Value Ref Range   Glucose 82 65 - 99 mg/dL   BUN 12 8 - 27 mg/dL   Creatinine, Ser 0.57 0.57 - 1.00 mg/dL   GFR calc non Af Amer 100 >59 mL/min/1.73   GFR calc Af Amer 115 >59 mL/min/1.73   BUN/Creatinine Ratio 21 12 - 28   Sodium 143 134 - 144 mmol/L   Potassium 4.2 3.5 - 5.2 mmol/L   Chloride 102 96 - 106 mmol/L   CO2 26 20 - 29 mmol/L   Calcium 9.4 8.7 - 10.3 mg/dL   Total Protein 6.2 6.0 - 8.5 g/dL   Albumin 4.0 3.8 - 4.8 g/dL   Globulin, Total 2.2 1.5 - 4.5 g/dL   Albumin/Globulin Ratio 1.8 1.2 - 2.2   Bilirubin Total 0.3 0.0 - 1.2 mg/dL   Alkaline Phosphatase 101 39 - 117 IU/L   AST 22 0 - 40 IU/L   ALT 18 0 - 32 IU/L  CBC with Differential/Platelet  Result Value Ref Range   WBC 5.0 3.4 - 10.8 x10E3/uL   RBC 3.92 3.77 - 5.28 x10E6/uL   Hemoglobin  12.3 11.1 - 15.9 g/dL   Hematocrit 35.5 34.0 - 46.6 %   MCV 91 79 - 97 fL   MCH 31.4 26.6 - 33.0 pg   MCHC 34.6 31.5 - 35.7 g/dL   RDW 12.8 11.7 - 15.4 %   Platelets 178 150 - 450 x10E3/uL   Neutrophils 40 Not Estab. %   Lymphs 39 Not Estab. %   Monocytes 10 Not Estab. %   Eos 9 Not Estab. %   Basos 2 Not Estab. %   Neutrophils Absolute 2.1 1.4 - 7.0 x10E3/uL   Lymphocytes Absolute 1.9 0.7 - 3.1 x10E3/uL   Monocytes Absolute 0.5 0.1 - 0.9 x10E3/uL   EOS (ABSOLUTE) 0.5 (H) 0.0 - 0.4 x10E3/uL   Basophils Absolute 0.1 0.0 - 0.2 x10E3/uL   Immature Granulocytes 0 Not Estab. %   Immature Grans (Abs) 0.0 0.0 - 0.1 x10E3/uL      Assessment & Plan:   1. Disc degeneration, lumbosacral - oxyCODONE-acetaminophen (PERCOCET/ROXICET) 5-325 MG tablet; Take 1 tablet by mouth every 4 (four) hours as needed for severe pain.  Dispense: 60 tablet; Refill: 0 - oxyCODONE-acetaminophen (ROXICET) 5-325 MG tablet; Take 1 tablet by mouth every 4 (four) hours as needed for severe pain.  Dispense: 60 tablet; Refill: 0 - oxyCODONE-acetaminophen (ROXICET) 5-325 MG tablet; Take 1 tablet by mouth every 4 (four) hours as needed for severe pain.  Dispense:  60 tablet; Refill: 0  2. Dysfunction of both eustachian tubes - fluticasone (FLONASE) 50 MCG/ACT nasal spray; Place 2 sprays into both nostrils daily.  Dispense: 16 g; Refill: 2  3. Gastroesophageal reflux disease without esophagitis - omeprazole (PRILOSEC) 40 MG capsule; Take 1 capsule (40 mg total) by mouth 2 (two) times daily.  Dispense: 180 capsule; Refill: 3   Continue all other maintenance medications as listed above.  Follow up plan: Return in about 6 months (around 10/07/2019).  Educational handout given for [place] PA-C [facility] [address]  [place], [place] [address] [phone]   04/06/2019, 10:09 AM

## 2019-05-28 DIAGNOSIS — H5203 Hypermetropia, bilateral: Secondary | ICD-10-CM | POA: Diagnosis not present

## 2019-06-09 ENCOUNTER — Other Ambulatory Visit: Payer: Self-pay

## 2019-06-10 ENCOUNTER — Other Ambulatory Visit: Payer: Self-pay

## 2019-06-10 ENCOUNTER — Ambulatory Visit (INDEPENDENT_AMBULATORY_CARE_PROVIDER_SITE_OTHER): Payer: Medicare Other

## 2019-06-10 DIAGNOSIS — Z23 Encounter for immunization: Secondary | ICD-10-CM

## 2019-06-24 DIAGNOSIS — M65331 Trigger finger, right middle finger: Secondary | ICD-10-CM | POA: Diagnosis not present

## 2019-06-24 DIAGNOSIS — M79644 Pain in right finger(s): Secondary | ICD-10-CM | POA: Diagnosis not present

## 2019-09-16 DIAGNOSIS — M65331 Trigger finger, right middle finger: Secondary | ICD-10-CM | POA: Diagnosis not present

## 2019-09-16 DIAGNOSIS — M25531 Pain in right wrist: Secondary | ICD-10-CM | POA: Diagnosis not present

## 2019-09-16 DIAGNOSIS — M79644 Pain in right finger(s): Secondary | ICD-10-CM | POA: Diagnosis not present

## 2019-09-22 ENCOUNTER — Ambulatory Visit (INDEPENDENT_AMBULATORY_CARE_PROVIDER_SITE_OTHER): Payer: Medicare Other | Admitting: Family Medicine

## 2019-09-22 ENCOUNTER — Encounter: Payer: Self-pay | Admitting: Family Medicine

## 2019-09-22 DIAGNOSIS — J209 Acute bronchitis, unspecified: Secondary | ICD-10-CM

## 2019-09-22 MED ORDER — HYDROCODONE-HOMATROPINE 5-1.5 MG/5ML PO SYRP: 5.0000 mL | ORAL_SOLUTION | Freq: Three times a day (TID) | ORAL | 0 refills | Status: DC | PRN

## 2019-09-22 MED ORDER — PREDNISONE 10 MG (21) PO TBPK: ORAL_TABLET | ORAL | 0 refills | Status: DC

## 2019-09-22 NOTE — Patient Instructions (Signed)

## 2019-09-22 NOTE — Progress Notes (Signed)
Virtual Visit via Telephone Note  I connected with Valerie Baker on 09/22/19 at 1:51 PM by telephone and verified that I am speaking with the correct person using two identifiers. Valerie Baker is currently located at home and nobody is currently with her during this visit. The provider, Loman Brooklyn, FNP is located in their office at time of visit.  I discussed the limitations, risks, security and privacy concerns of performing an evaluation and management service by telephone and the availability of in person appointments. I also discussed with the patient that there may be a patient responsible charge related to this service. The patient expressed understanding and agreed to proceed.  Subjective: PCP: Terald Sleeper, PA-C  Chief Complaint  Patient presents with  . Bronchitis   Patient complains of a nonproductive cough, tightness in the chest, and hoarseness. Onset of symptoms was 5 days ago, unchanged since that time. She is drinking plenty of fluids. Evaluation to date: none. Treatment to date: Albuterol inhaler. She has a history of allergies and asthma. She does not smoke.    ROS: Per HPI  Current Outpatient Medications:  .  albuterol (PROVENTIL HFA;VENTOLIN HFA) 108 (90 Base) MCG/ACT inhaler, Inhale 2 puffs into the lungs every 6 (six) hours as needed for wheezing or shortness of breath., Disp: 1 Inhaler, Rfl: 0 .  augmented betamethasone dipropionate (DIPROLENE-AF) 0.05 % cream, Apply topically 2 (two) times daily., Disp: 100 g, Rfl: 2 .  baclofen (LIORESAL) 10 MG tablet, Take 1 tablet by mouth 3 times a day as needed for muscle spasms, Disp: 270 tablet, Rfl: 2 .  betamethasone valerate (VALISONE) 0.1 % cream, Apply topically 2 (two) times daily., Disp: , Rfl:  .  Calcium Carbonate-Vitamin D (CALCIUM 600+D) 600-400 MG-UNIT per tablet, Take 1 tablet by mouth 2 (two) times daily.  , Disp: , Rfl:  .  Cholecalciferol (VITAMIN D3) 2000 UNITS TABS, Take 1 tablet by  mouth daily.  , Disp: , Rfl:  .  citalopram (CELEXA) 20 MG tablet, Take 1 tablet (20 mg total) by mouth daily., Disp: 90 tablet, Rfl: 2 .  clotrimazole-betamethasone (LOTRISONE) cream, Apply 1 application topically 2 (two) times daily., Disp: 30 g, Rfl: 0 .  diclofenac sodium (VOLTAREN) 1 % GEL, APPLY 4 GRAMS 4 TIMES DAILY AS NEEDED, Disp: 300 g, Rfl: 5 .  fluocinonide gel (LIDEX) 0.05 %, Apply topically 2 (two) times daily., Disp: 60 g, Rfl: 4 .  fluticasone (FLONASE) 50 MCG/ACT nasal spray, Place 2 sprays into both nostrils daily., Disp: 16 g, Rfl: 2 .  gabapentin (NEURONTIN) 400 MG capsule, Take 2 capsules (800 mg total) by mouth 3 (three) times daily., Disp: 540 capsule, Rfl: 2 .  loratadine (CLARITIN) 10 MG tablet, Take 10 mg by mouth daily as needed for allergies., Disp: , Rfl:  .  meclizine (ANTIVERT) 12.5 MG tablet, Take 1 tablet (12.5 mg total) by mouth 3 (three) times daily as needed for dizziness., Disp: 30 tablet, Rfl: 0 .  meloxicam (MOBIC) 15 MG tablet, Take 1 tablet (15 mg total) by mouth daily., Disp: 90 tablet, Rfl: 2 .  Multiple Vitamin (MULTIVITAMIN) tablet, Take 1 tablet by mouth daily. , Disp: , Rfl:  .  omeprazole (PRILOSEC) 40 MG capsule, Take 1 capsule (40 mg total) by mouth 2 (two) times daily., Disp: 180 capsule, Rfl: 3 .  ondansetron (ZOFRAN) 4 MG tablet, Take 1 tablet (4 mg total) by mouth every 8 (eight) hours as needed for nausea or vomiting.,  Disp: 20 tablet, Rfl: 0 .  oxybutynin (DITROPAN) 5 MG tablet, Take 1 tablet (5 mg total) by mouth 2 (two) times daily., Disp: 180 tablet, Rfl: 2 .  oxyCODONE-acetaminophen (PERCOCET/ROXICET) 5-325 MG tablet, Take 1 tablet by mouth every 4 (four) hours as needed for severe pain., Disp: 60 tablet, Rfl: 0 .  oxyCODONE-acetaminophen (ROXICET) 5-325 MG tablet, Take 1 tablet by mouth every 4 (four) hours as needed for severe pain., Disp: 60 tablet, Rfl: 0 .  oxyCODONE-acetaminophen (ROXICET) 5-325 MG tablet, Take 1 tablet by mouth every  4 (four) hours as needed for severe pain., Disp: 60 tablet, Rfl: 0 .  traZODone (DESYREL) 100 MG tablet, Take 1 tablet (100 mg total) by mouth at bedtime., Disp: 90 tablet, Rfl: 3 .  triamcinolone cream (KENALOG) 0.1 %, Apply topically 2 (two) times daily., Disp: 453.6 g, Rfl: 5  Allergies  Allergen Reactions  . Sulfonamide Derivatives Rash   Past Medical History:  Diagnosis Date  . Adenomatous colon polyp 06/2006  . Allergy    SINUS  . Anxiety   . Anxiety and depression   . Arthritis   . Asthma   . Bladder incontinence   . Blood transfusion   . Cataract   . Depression   . Fibromyalgia   . GERD (gastroesophageal reflux disease)   . Hyperlipidemia   . Hypertension   . IBS (irritable bowel syndrome)   . Kidney stones   . Migraines   . Obesity   . Osteopenia     Observations/Objective: A&O  Hoarse No respiratory distress or wheezing audible over the phone Mood, judgement, and thought processes all WNL  Assessment and Plan: 1. Acute bronchitis, unspecified organism - Education provided on acute bronchitis.  Encouraged adequate hydration.  Continue albuterol inhaler as needed. - predniSONE (STERAPRED UNI-PAK 21 TAB) 10 MG (21) TBPK tablet; As directed x 6 days  Dispense: 21 tablet; Refill: 0 - HYDROcodone-homatropine (HYCODAN) 5-1.5 MG/5ML syrup; Take 5 mLs by mouth every 8 (eight) hours as needed for cough.  Dispense: 120 mL; Refill: 0   Follow Up Instructions:  I discussed the assessment and treatment plan with the patient. The patient was provided an opportunity to ask questions and all were answered. The patient agreed with the plan and demonstrated an understanding of the instructions.   The patient was advised to call back or seek an in-person evaluation if the symptoms worsen or if the condition fails to improve as anticipated.  The above assessment and management plan was discussed with the patient. The patient verbalized understanding of and has agreed to the  management plan. Patient is aware to call the clinic if symptoms persist or worsen. Patient is aware when to return to the clinic for a follow-up visit. Patient educated on when it is appropriate to go to the emergency department.   Time call ended: 1:57 PM  I provided 8 minutes of non-face-to-face time during this encounter.  Hendricks Limes, MSN, APRN, FNP-C [facility] 09/22/19

## 2019-09-24 DIAGNOSIS — M545 Low back pain: Secondary | ICD-10-CM | POA: Diagnosis not present

## 2019-09-24 DIAGNOSIS — M4325 Fusion of spine, thoracolumbar region: Secondary | ICD-10-CM | POA: Diagnosis not present

## 2019-09-24 DIAGNOSIS — M4326 Fusion of spine, lumbar region: Secondary | ICD-10-CM | POA: Diagnosis not present

## 2019-09-24 DIAGNOSIS — M25552 Pain in left hip: Secondary | ICD-10-CM | POA: Diagnosis not present

## 2019-09-24 DIAGNOSIS — G894 Chronic pain syndrome: Secondary | ICD-10-CM | POA: Diagnosis not present

## 2019-09-24 DIAGNOSIS — M7918 Myalgia, other site: Secondary | ICD-10-CM | POA: Diagnosis not present

## 2019-10-08 ENCOUNTER — Ambulatory Visit: Payer: Medicare Other | Admitting: Physician Assistant

## 2019-10-28 DIAGNOSIS — G894 Chronic pain syndrome: Secondary | ICD-10-CM | POA: Diagnosis not present

## 2019-10-28 DIAGNOSIS — M7918 Myalgia, other site: Secondary | ICD-10-CM | POA: Diagnosis not present

## 2019-10-28 DIAGNOSIS — M545 Low back pain: Secondary | ICD-10-CM | POA: Diagnosis not present

## 2019-10-28 DIAGNOSIS — M4325 Fusion of spine, thoracolumbar region: Secondary | ICD-10-CM | POA: Diagnosis not present

## 2019-10-29 ENCOUNTER — Other Ambulatory Visit: Payer: Self-pay | Admitting: Orthopaedic Surgery

## 2019-10-29 DIAGNOSIS — M4325 Fusion of spine, thoracolumbar region: Secondary | ICD-10-CM

## 2019-11-10 ENCOUNTER — Encounter: Payer: Self-pay | Admitting: *Deleted

## 2019-11-16 ENCOUNTER — Ambulatory Visit
Admission: RE | Admit: 2019-11-16 | Discharge: 2019-11-16 | Disposition: A | Discharge status: Home / Self Care | Payer: Medicare Other | Source: Ambulatory Visit | Attending: Orthopaedic Surgery | Admitting: Orthopaedic Surgery

## 2019-11-16 ENCOUNTER — Inpatient Hospital Stay: Admission: RE | Admit: 2019-11-16 | Payer: PPO | Source: Ambulatory Visit

## 2019-11-16 ENCOUNTER — Other Ambulatory Visit: Payer: Self-pay

## 2019-11-16 DIAGNOSIS — M4325 Fusion of spine, thoracolumbar region: Secondary | ICD-10-CM

## 2019-11-16 DIAGNOSIS — M545 Low back pain: Secondary | ICD-10-CM | POA: Diagnosis not present

## 2019-11-26 ENCOUNTER — Encounter: Payer: Medicare Other | Admitting: Family Medicine

## 2019-12-02 DIAGNOSIS — G894 Chronic pain syndrome: Secondary | ICD-10-CM | POA: Diagnosis not present

## 2019-12-02 DIAGNOSIS — M7918 Myalgia, other site: Secondary | ICD-10-CM | POA: Diagnosis not present

## 2019-12-02 DIAGNOSIS — M4325 Fusion of spine, thoracolumbar region: Secondary | ICD-10-CM | POA: Diagnosis not present

## 2019-12-02 DIAGNOSIS — M545 Low back pain: Secondary | ICD-10-CM | POA: Diagnosis not present

## 2019-12-17 ENCOUNTER — Other Ambulatory Visit: Payer: Self-pay

## 2019-12-17 ENCOUNTER — Ambulatory Visit: Payer: Self-pay

## 2019-12-17 ENCOUNTER — Ambulatory Visit: Payer: Medicare Other | Admitting: Orthopaedic Surgery

## 2019-12-17 ENCOUNTER — Telehealth: Payer: Self-pay

## 2019-12-17 VITALS — Ht 63.5 in | Wt 240.0 lb

## 2019-12-17 DIAGNOSIS — G8929 Other chronic pain: Secondary | ICD-10-CM

## 2019-12-17 DIAGNOSIS — M1711 Unilateral primary osteoarthritis, right knee: Secondary | ICD-10-CM

## 2019-12-17 DIAGNOSIS — M25561 Pain in right knee: Secondary | ICD-10-CM

## 2019-12-17 MED ORDER — LIDOCAINE HCL 1 % IJ SOLN
3.0000 mL | INTRAMUSCULAR | Status: AC | PRN
  Administered 2019-12-17: 15:00:00 3 mL

## 2019-12-17 MED ORDER — METHYLPREDNISOLONE ACETATE 40 MG/ML IJ SUSP
40.0000 mg | INTRAMUSCULAR | Status: AC | PRN
  Administered 2019-12-17: 15:00:00 40 mg via INTRA_ARTICULAR

## 2019-12-17 NOTE — Progress Notes (Signed)
Office Visit Note   Patient: Valerie Baker           Date of Birth: [birth year]           MRN: [id number] Visit Date: 12/17/2019              Requested by: Terald Sleeper, PA-C [id number] [place],  [place] [id number] PCP: Terald Sleeper, PA-C   Assessment & Plan: Visit Diagnoses:  1. Chronic pain of right knee   2. Unilateral primary osteoarthritis, right knee   3. Severe obesity (BMI >= 40) (HCC)     Plan: I did counsel her extensively about weight loss given her BMI.  We talked about how this could help her knee and her back.  I do feel at some point she is a candidate for knee replacement surgery if she is able to lose appropriate weight and she agrees with this.  I do feel that we need to temporize her acute pain with a steroid injection today.  Also feel that she is a candidate for hyaluronic acid for the knee.  She does agree with this treatment plan.  All questions and concerns were answered and addressed.  I did place a steroid injection in her right knee that she tolerated well.  I will see her back in 4 weeks to hopefully place hyaluronic acid into the right knee to treat the pain from osteoarthritis.  Follow-Up Instructions: Return in about 4 weeks (around 01/14/2020).   Orders:  Orders Placed This Encounter  Procedures  . Large Joint Inj  . XR Knee 1-2 Views Right   No orders of the defined types were placed in this encounter.     Procedures: Large Joint Inj: R knee on 12/17/2019 3:18 PM Indications: diagnostic evaluation and pain Details: 22 G 1.5 in needle, superolateral approach  Arthrogram: No  Medications: 3 mL lidocaine 1 %; 40 mg methylPREDNISolone acetate 40 MG/ML Outcome: tolerated well, no immediate complications Procedure, treatment alternatives, risks and benefits explained, specific risks discussed. Consent was given by the patient. Immediately prior to procedure a time out was called to verify the correct patient, procedure, equipment, support staff  and site/side marked as required. Patient was prepped and draped in the usual sterile fashion.       Clinical Data: No additional findings.   Subjective: Chief Complaint  Patient presents with  . Right Knee - Pain  Patient is still not seen before.  She comes in with significant right knee pain mainly along the medial joint line.  I have documented arthritis in her right knee before.  She says it certainly getting worse.  It is affecting her spine.  She has been getting injections in her back.  She has a history of a left total knee arthroplasty we did over a decade ago.  That has been fine with her thus far.  She does report a weight gain.  Her BMI today is 41.85.  She has had no other acute change in her medical status.  HPI  Review of Systems She currently denies any headache, chest pain, shortness of breath, fever, chills, nausea, vomiting  Objective: Vital Signs: Ht 5' 3.5" (1.613 m)   Wt 240 lb (108.9 kg)   BMI 41.85 kg/m   Physical Exam She is alert and orient x3 and in no acute distress Ortho Exam Examination of her right knee shows her range of motion is full.  She has varus malalignment.  There is no effusion.  Her knee is ligamentously stable.  There is severe tenderness along palpating the medial joint line.  There is also patellofemoral crepitation throughout arc of motion. Specialty Comments:  No specialty comments available.  Imaging: XR Knee 1-2 Views Right  Result Date: 12/17/2019 2 views of the right knee show severe arthritic findings with significant medial joint line narrowing with almost bone-on-bone wear.  There is varus malalignment.  There is significant narrowing of the patellofemoral joint.  There are osteophytes in all 3 compartments.    PMFS History: Patient Active Problem List   Diagnosis Date Noted  . Dysfunction of both eustachian tubes 04/06/2019  . Unilateral primary osteoarthritis, right knee 12/30/2017  . Depression, recurrent ([place])  10/27/2017  . Contact dermatitis and eczema due to plant 11/29/2016  . Overactive bladder 11/29/2016  . Frequent urination 01/23/2016  . Urgency of urination 01/23/2016  . Rash 01/23/2016  . Urinary frequency 04/21/2015  . Stress fracture of pelvis 11/03/2014  . Disc degeneration, lumbosacral 11/03/2014  . Osteopenia 11/03/2014  . Severe obesity (BMI >= 40) ([place]) 11/03/2014  . Essential hypertension 04/04/2009  . ASTHMA 04/04/2009  . MIGRAINES, HX OF 04/04/2009   Past Medical History:  Diagnosis Date  . Adenomatous colon polyp 06/2006  . Allergy    SINUS  . Anxiety   . Anxiety and depression   . Arthritis   . Asthma   . Bladder incontinence   . Blood transfusion   . Cataract   . Depression   . Fibromyalgia   . GERD (gastroesophageal reflux disease)   . Hyperlipidemia   . Hypertension   . IBS (irritable bowel syndrome)   . Kidney stones   . Migraines   . Obesity   . Osteopenia     Family History  Problem Relation Age of Onset  . Heart disease Mother   . Diabetes Mother   . Melanoma Mother        nose  . Diabetes Brother   . Cirrhosis Brother   . Hypertension Brother   . Stroke Father   . Hypertension Sister   . Hypertension Brother     Past Surgical History:  Procedure Laterality Date  . ABDOMINAL HYSTERECTOMY    . APPENDECTOMY    . BACK SURGERY  2000  . DEBRIDEMENT TENNIS ELBOW    . ELBOW SURGERY  2003  . EYE SURGERY    . [place]   right  . LUMBAR FUSION  2007  . LUMBAR FUSION  2002,2003,2007,2008  . NASAL SINUS SURGERY  2003  . NISSEN FUNDOPLICATION  [id number]  . ROTATOR CUFF REPAIR  [id number]  . SPINAL CORD STIMULATOR IMPLANT  2005  . TOTAL KNEE ARTHROPLASTY  2008,2009   Social History   Occupational History  . Occupation: Disabled  Tobacco Use  . Smoking status: Former Smoker    Years: 5.00    Quit date: 11/02/2005    Years since quitting: 14.1  . Smokeless tobacco: Never Used  Substance and Sexual Activity  . Alcohol use: No  .  Drug use: No  . Sexual activity: Yes

## 2019-12-17 NOTE — Telephone Encounter (Signed)
Please see message. °

## 2019-12-17 NOTE — Telephone Encounter (Signed)
Right knee gel injection  

## 2019-12-17 NOTE — Telephone Encounter (Signed)
Noted  

## 2019-12-18 ENCOUNTER — Telehealth: Payer: Self-pay

## 2019-12-18 NOTE — Telephone Encounter (Signed)
Submitted VOB for SynviscOne, right knee. 

## 2019-12-23 ENCOUNTER — Telehealth: Payer: Self-pay

## 2019-12-23 NOTE — Telephone Encounter (Signed)
Approved for SynviscOne, right knee. Buy & Bill Covered at 80% of the allowed amount.  Patient will be responsible for 20% OOP. Co-pay of $35.00 required No PA required  Appt. 01/13/2020

## 2019-12-28 ENCOUNTER — Encounter: Payer: Medicare Other | Admitting: Family Medicine

## 2019-12-30 ENCOUNTER — Encounter: Payer: Medicare Other | Admitting: Family Medicine

## 2019-12-31 ENCOUNTER — Ambulatory Visit (INDEPENDENT_AMBULATORY_CARE_PROVIDER_SITE_OTHER): Payer: Medicare Other | Admitting: Family

## 2019-12-31 ENCOUNTER — Encounter: Payer: Self-pay | Admitting: Family

## 2019-12-31 DIAGNOSIS — J4541 Moderate persistent asthma with (acute) exacerbation: Secondary | ICD-10-CM

## 2019-12-31 MED ORDER — PROMETHAZINE-DM 6.25-15 MG/5ML PO SYRP: 5.0000 mL | ORAL_SOLUTION | Freq: Three times a day (TID) | ORAL | 0 refills | Status: DC | PRN

## 2019-12-31 MED ORDER — PREDNISONE 10 MG (21) PO TBPK: ORAL_TABLET | ORAL | 0 refills | Status: DC

## 2019-12-31 NOTE — Progress Notes (Signed)
Virtual Visit via telephone Note Due to COVID-19 pandemic this visit was conducted virtually. This visit type was conducted due to national recommendations for restrictions regarding the COVID-19 Pandemic (e.g. social distancing, sheltering in place) in an effort to limit this patient's exposure and mitigate transmission in our community. All issues noted in this document were discussed and addressed.  A physical exam was not performed with this format.  I connected with Valerie Baker on 12/31/19 at 10:51 AM by telephone and verified that I am speaking with the correct person using two identifiers. Valerie Baker is currently located at home and husband is currently with her during visit. The provider, Evelina Dun, FNP is located in their office at time of visit.  I discussed the limitations, risks, security and privacy concerns of performing an evaluation and management service by telephone and the availability of in person appointments. I also discussed with the patient that there may be a patient responsible charge related to this service. The patient expressed understanding and agreed to proceed.   History and Present Illness:  Cough This is a new problem. The current episode started in the past 7 days. The problem has been gradually worsening. The problem occurs every few minutes. The cough is productive of sputum. Associated symptoms include ear congestion (right), headaches, postnasal drip and wheezing. Pertinent negatives include no chills, ear pain, fever, myalgias or shortness of breath. The symptoms are aggravated by lying down. She has tried rest and OTC cough suppressant for the symptoms. The treatment provided mild relief. Her past medical history is significant for asthma.      Review of Systems  Constitutional: Negative for chills and fever.  HENT: Positive for postnasal drip. Negative for ear pain.   Respiratory: Positive for cough and wheezing. Negative for  shortness of breath.   Musculoskeletal: Negative for myalgias.  Neurological: Positive for headaches.  All other systems reviewed and are negative.    Observations/Objective: No SOB or distress noted, coarse cough noted  Assessment and Plan: 1. Moderate persistent asthma with acute exacerbation - Take meds as prescribed - Use a cool mist humidifier  -Use saline nose sprays frequently -Force fluids -For any cough or congestion  Use plain Mucinex- regular strength or max strength is fine -For fever or aces or pains- take tylenol or ibuprofen. -Throat lozenges if help -Call if symptoms worsens or do not improve  - predniSONE (STERAPRED UNI-PAK 21 TAB) 10 MG (21) TBPK tablet; Use as directed  Dispense: 21 tablet; Refill: 0 - promethazine-dextromethorphan (PROMETHAZINE-DM) 6.25-15 MG/5ML syrup; Take 5 mLs by mouth 3 (three) times daily as needed for cough.  Dispense: 118 mL; Refill: 0    I discussed the assessment and treatment plan with the patient. The patient was provided an opportunity to ask questions and all were answered. The patient agreed with the plan and demonstrated an understanding of the instructions.   The patient was advised to call back or seek an in-person evaluation if the symptoms worsen or if the condition fails to improve as anticipated.  The above assessment and management plan was discussed with the patient. The patient verbalized understanding of and has agreed to the management plan. Patient is aware to call the clinic if symptoms persist or worsen. Patient is aware when to return to the clinic for a follow-up visit. Patient educated on when it is appropriate to go to the emergency department.   Time call ended:  11:00 AM  I provided 9 minutes of  non-face-to-face time during this encounter.    Evelina Dun, FNP

## 2020-01-04 ENCOUNTER — Telehealth: Payer: Self-pay | Admitting: Family Medicine

## 2020-01-04 MED ORDER — AMOXICILLIN 875 MG PO TABS
875.0000 mg | ORAL_TABLET | Freq: Two times a day (BID) | ORAL | 0 refills | Status: DC
Start: 2020-01-04 — End: 2020-01-22

## 2020-01-04 NOTE — Telephone Encounter (Signed)
Pt aware of provider feedback and voiced understanding. 

## 2020-01-04 NOTE — Telephone Encounter (Signed)
Amoxicillin 875 mg Prescription sent to pharmacy. New tooth brush in 3 days.

## 2020-01-05 ENCOUNTER — Telehealth: Payer: Self-pay | Admitting: Family Medicine

## 2020-01-05 MED ORDER — FLUCONAZOLE 150 MG PO TABS
150.0000 mg | ORAL_TABLET | ORAL | 0 refills | Status: DC | PRN
Start: 2020-01-05 — End: 2020-04-22

## 2020-01-05 NOTE — Telephone Encounter (Signed)
Pt called stating that Amoxicillin was prescribed to her yesterday but says she forgot to let the doctor know that she gets yeast infections when taking antibiotics.

## 2020-01-05 NOTE — Telephone Encounter (Signed)
Diflucan Prescription sent to pharmacy   

## 2020-01-05 NOTE — Telephone Encounter (Signed)
Valerie Baker, you prescribed Amoxicillin yesterday.  Patient states she is prone to yeast infections when on antibiotics.  Can you send in Diflucan?

## 2020-01-05 NOTE — Telephone Encounter (Signed)
Patient aware that Diflucan was sent to pharmacy.

## 2020-01-12 ENCOUNTER — Other Ambulatory Visit: Payer: Self-pay

## 2020-01-12 DIAGNOSIS — F339 Major depressive disorder, recurrent, unspecified: Secondary | ICD-10-CM

## 2020-01-12 DIAGNOSIS — M5137 Other intervertebral disc degeneration, lumbosacral region: Secondary | ICD-10-CM

## 2020-01-12 DIAGNOSIS — R350 Frequency of micturition: Secondary | ICD-10-CM

## 2020-01-12 MED ORDER — MELOXICAM 15 MG PO TABS: 15.0000 mg | ORAL_TABLET | Freq: Every day | ORAL | 0 refills | Status: DC

## 2020-01-12 MED ORDER — CITALOPRAM HYDROBROMIDE 20 MG PO TABS: 20.0000 mg | ORAL_TABLET | Freq: Every day | ORAL | 0 refills | Status: DC

## 2020-01-12 MED ORDER — GABAPENTIN 400 MG PO CAPS: 800.0000 mg | ORAL_CAPSULE | Freq: Three times a day (TID) | ORAL | 0 refills | Status: DC

## 2020-01-12 MED ORDER — TRAZODONE HCL 100 MG PO TABS: 100.0000 mg | ORAL_TABLET | Freq: Every day | ORAL | 0 refills | Status: DC

## 2020-01-12 MED ORDER — OXYBUTYNIN CHLORIDE 5 MG PO TABS: 5.0000 mg | ORAL_TABLET | Freq: Two times a day (BID) | ORAL | 0 refills | Status: DC

## 2020-01-12 NOTE — Telephone Encounter (Signed)
Last office visit 12/31/2019  Meloxicam: Last refill 11/14/2018 #90, 2 refills  Trazodone: Last refill 11/14/2018 #90, 3 refills

## 2020-01-13 ENCOUNTER — Ambulatory Visit: Payer: Medicare Other | Admitting: Orthopaedic Surgery

## 2020-01-21 DIAGNOSIS — M542 Cervicalgia: Secondary | ICD-10-CM | POA: Diagnosis not present

## 2020-01-21 DIAGNOSIS — G894 Chronic pain syndrome: Secondary | ICD-10-CM | POA: Diagnosis not present

## 2020-01-21 DIAGNOSIS — M7551 Bursitis of right shoulder: Secondary | ICD-10-CM | POA: Diagnosis not present

## 2020-01-21 DIAGNOSIS — M4325 Fusion of spine, thoracolumbar region: Secondary | ICD-10-CM | POA: Diagnosis not present

## 2020-01-22 ENCOUNTER — Encounter: Payer: Self-pay | Admitting: Family Medicine

## 2020-01-22 ENCOUNTER — Telehealth: Payer: Self-pay | Admitting: *Deleted

## 2020-01-22 ENCOUNTER — Ambulatory Visit (INDEPENDENT_AMBULATORY_CARE_PROVIDER_SITE_OTHER): Payer: Medicare Other | Admitting: Family Medicine

## 2020-01-22 ENCOUNTER — Other Ambulatory Visit: Payer: Self-pay

## 2020-01-22 VITALS — BP 128/65 | HR 64 | Temp 97.6°F | Ht 63.5 in | Wt 240.8 lb

## 2020-01-22 DIAGNOSIS — Z1211 Encounter for screening for malignant neoplasm of colon: Secondary | ICD-10-CM

## 2020-01-22 DIAGNOSIS — M170 Bilateral primary osteoarthritis of knee: Secondary | ICD-10-CM

## 2020-01-22 DIAGNOSIS — Z79899 Other long term (current) drug therapy: Secondary | ICD-10-CM | POA: Diagnosis not present

## 2020-01-22 DIAGNOSIS — E7800 Pure hypercholesterolemia, unspecified: Secondary | ICD-10-CM | POA: Diagnosis not present

## 2020-01-22 DIAGNOSIS — F1190 Opioid use, unspecified, uncomplicated: Secondary | ICD-10-CM | POA: Diagnosis not present

## 2020-01-22 DIAGNOSIS — M5137 Other intervertebral disc degeneration, lumbosacral region: Secondary | ICD-10-CM

## 2020-01-22 DIAGNOSIS — Z114 Encounter for screening for human immunodeficiency virus [HIV]: Secondary | ICD-10-CM

## 2020-01-22 DIAGNOSIS — M51379 Other intervertebral disc degeneration, lumbosacral region without mention of lumbar back pain or lower extremity pain: Secondary | ICD-10-CM

## 2020-01-22 DIAGNOSIS — Z0001 Encounter for general adult medical examination with abnormal findings: Secondary | ICD-10-CM

## 2020-01-22 DIAGNOSIS — R7309 Other abnormal glucose: Secondary | ICD-10-CM | POA: Diagnosis not present

## 2020-01-22 DIAGNOSIS — Z1159 Encounter for screening for other viral diseases: Secondary | ICD-10-CM | POA: Diagnosis not present

## 2020-01-22 DIAGNOSIS — Z0000 Encounter for general adult medical examination without abnormal findings: Secondary | ICD-10-CM

## 2020-01-22 DIAGNOSIS — Z7689 Persons encountering health services in other specified circumstances: Secondary | ICD-10-CM

## 2020-01-22 LAB — BAYER DCA HB A1C WAIVED: HB A1C (BAYER DCA - WAIVED): 5.4 % (ref <7.0)

## 2020-01-22 MED ORDER — OXYCODONE-ACETAMINOPHEN 5-325 MG PO TABS: 1.0000 | ORAL_TABLET | Freq: Three times a day (TID) | ORAL | 0 refills | Status: AC | PRN

## 2020-01-22 MED ORDER — FLUOCINONIDE 0.05 % EX GEL: Freq: Two times a day (BID) | CUTANEOUS | 4 refills | Status: DC

## 2020-01-22 MED ORDER — OXYCODONE-ACETAMINOPHEN 5-325 MG PO TABS: 1.0000 | ORAL_TABLET | Freq: Three times a day (TID) | ORAL | 0 refills | Status: DC | PRN

## 2020-01-22 NOTE — Telephone Encounter (Signed)
Insurance will only pay for a 7 day rx for the 1st fill. Please resend

## 2020-01-22 NOTE — Progress Notes (Signed)
Valerie Baker is a 64 y.o. female presents to office today for annual physical exam examination.    Concerns today include: 1. Chronic pain/ pain management Patient with chronic back pain that extends from the neck down into the sacrum.  She has history of fusions on several levels.  She sees Dr. Patrice Paradise for her back pain with recent visit a couple of days ago for uncontrolled neck pain radiating to bilateral shoulders.  She was treated with a Toradol shot and then placed on prednisone Dosepak.  Of note she is also treated with meloxicam 15 mg daily, gabapentin 800 mg 3 times daily (she is trying to wean herself down from the gabapentin as she finds it causes difficulty losing weight).  She also has chronic bilateral knee pain.  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 Ninfa Linden.  She would like to actually see Dr. Ricki Rodriguez for her right knee.  She is currently undergoing corticosteroid injections but these are not helping.  She does report occasional instability of the lower extremities but rarely has any falls.  No fall greater than 1 year.  No sensory changes.  She consider viscosupplementation but unfortunately insurance would not cover this well.  Typically, she uses Percocet 1-2 times daily.  When her pain is in flare she will go up to every 8 hours.  She never uses more than 3 times daily dosing.  She does report constipation but this is relieved by use of stool softener which she takes each time she takes a dose of the opioid.  Sometimes she does feel like she has some memory changes but is unsure if this is related to the medication.  She certainly does not have any excessive daytime sedation, falls.  She uses no drugs, no alcohol and is a former smoker that quit many years ago.  Last dose of Percocet was in the last 24 hours.  Diet: fair, Exercise: none Last colonoscopy: needs Last mammogram: UTD Last pap smear: UTD    Past Medical History:  Diagnosis Date   . Adenomatous colon polyp 06/2006  . Allergy    SINUS  . Anxiety   . Anxiety and depression   . Arthritis   . Asthma   . Bladder incontinence   . Blood transfusion   . Cataract   . Depression   . Fibromyalgia   . GERD (gastroesophageal reflux disease)   . Hyperlipidemia   . Hypertension   . IBS (irritable bowel syndrome)   . Kidney stones   . Migraines   . Obesity   . Osteopenia    Social History   Socioeconomic History  . Marital status: Married    Spouse name: Not on file  . Number of children: 3  . Years of education: Not on file  . Highest education level: Not on file  Occupational History  . Occupation: Disabled  Tobacco Use  . Smoking status: Former Smoker    Years: 5.00    Quit date: 11/02/2005    Years since quitting: 14.2  . Smokeless tobacco: Never Used  Substance and Sexual Activity  . Alcohol use: No  . Drug use: No  . Sexual activity: Yes  Other Topics Concern  . Not on file  Social History Narrative  . Not on file   Social Determinants of Health   Financial Resource Strain:   . Difficulty of Paying Living Expenses:   Food Insecurity:   . Worried About Estate manager/land agent  of Food in the Last Year:   . [place] in the Last Year:   Transportation Needs:   . Lack of Transportation (Medical):   Marland Kitchen Lack of Transportation (Non-Medical):   Physical Activity:   . Days of Exercise per Week:   . Minutes of Exercise per Session:   Stress:   . Feeling of Stress :   Social Connections:   . Frequency of Communication with Friends and Family:   . Frequency of Social Gatherings with Friends and Family:   . Attends Religious Services:   . Active Member of Clubs or Organizations:   . Attends Archivist Meetings:   Marland Kitchen Marital Status:   Intimate Partner Violence:   . Fear of Current or Ex-Partner:   . Emotionally Abused:   Marland Kitchen Physically Abused:   . Sexually Abused:    Past Surgical History:  Procedure Laterality Date  . ABDOMINAL  HYSTERECTOMY    . APPENDECTOMY    . BACK SURGERY  2000  . DEBRIDEMENT TENNIS ELBOW    . ELBOW SURGERY  2003  . EYE SURGERY    . [place]   right  . LUMBAR FUSION  2007  . LUMBAR FUSION  2002,2003,2007,2008  . NASAL SINUS SURGERY  2003  . NISSEN FUNDOPLICATION  1443  . ROTATOR CUFF REPAIR  [id number]  . SPINAL CORD STIMULATOR IMPLANT  2005  . TOTAL KNEE ARTHROPLASTY  2008,2009   Family History  Problem Relation Age of Onset  . Heart disease Mother   . Diabetes Mother   . Melanoma Mother        nose  . Diabetes Brother   . Cirrhosis Brother   . Hypertension Brother   . Stroke Father   . Hypertension Sister   . Hypertension Brother     Current Outpatient Medications:  .  albuterol (PROVENTIL HFA;VENTOLIN HFA) 108 (90 Base) MCG/ACT inhaler, Inhale 2 puffs into the lungs every 6 (six) hours as needed for wheezing or shortness of breath., Disp: 1 Inhaler, Rfl: 0 .  augmented betamethasone dipropionate (DIPROLENE-AF) 0.05 % cream, Apply topically 2 (two) times daily., Disp: 100 g, Rfl: 2 .  betamethasone valerate (VALISONE) 0.1 % cream, Apply topically 2 (two) times daily., Disp: , Rfl:  .  Calcium Carbonate-Vitamin D (CALCIUM 600+D) 600-400 MG-UNIT per tablet, Take 1 tablet by mouth 2 (two) times daily.  , Disp: , Rfl:  .  Cholecalciferol (VITAMIN D3) 2000 UNITS TABS, Take 1 tablet by mouth daily.  , Disp: , Rfl:  .  citalopram (CELEXA) 20 MG tablet, Take 1 tablet (20 mg total) by mouth daily., Disp: 90 tablet, Rfl: 0 .  clotrimazole-betamethasone (LOTRISONE) cream, Apply 1 application topically 2 (two) times daily., Disp: 30 g, Rfl: 0 .  diclofenac sodium (VOLTAREN) 1 % GEL, APPLY 4 GRAMS 4 TIMES DAILY AS NEEDED, Disp: 300 g, Rfl: 5 .  fluconazole (DIFLUCAN) 150 MG tablet, Take 1 tablet (150 mg total) by mouth every three (3) days as needed., Disp: 3 tablet, Rfl: 0 .  fluocinonide gel (LIDEX) 0.05 %, Apply topically 2 (two) times daily., Disp: 60 g, Rfl: 4 .  fluticasone  (FLONASE) 50 MCG/ACT nasal spray, Place 2 sprays into both nostrils daily., Disp: 16 g, Rfl: 2 .  gabapentin (NEURONTIN) 400 MG capsule, Take 2 capsules (800 mg total) by mouth 3 (three) times daily., Disp: 540 capsule, Rfl: 0 .  loratadine (CLARITIN) 10 MG tablet, Take 10 mg by mouth daily as  needed for allergies., Disp: , Rfl:  .  meclizine (ANTIVERT) 12.5 MG tablet, Take 1 tablet (12.5 mg total) by mouth 3 (three) times daily as needed for dizziness., Disp: 30 tablet, Rfl: 0 .  meloxicam (MOBIC) 15 MG tablet, Take 1 tablet (15 mg total) by mouth daily., Disp: 90 tablet, Rfl: 0 .  Multiple Vitamin (MULTIVITAMIN) tablet, Take 1 tablet by mouth daily. , Disp: , Rfl:  .  omeprazole (PRILOSEC) 40 MG capsule, Take 1 capsule (40 mg total) by mouth 2 (two) times daily., Disp: 180 capsule, Rfl: 3 .  ondansetron (ZOFRAN) 4 MG tablet, Take 1 tablet (4 mg total) by mouth every 8 (eight) hours as needed for nausea or vomiting., Disp: 20 tablet, Rfl: 0 .  oxybutynin (DITROPAN) 5 MG tablet, Take 1 tablet (5 mg total) by mouth 2 (two) times daily., Disp: 180 tablet, Rfl: 0 .  [START ON 03/22/2020] oxyCODONE-acetaminophen (PERCOCET/ROXICET) 5-325 MG tablet, Take 1 tablet by mouth every 8 (eight) hours as needed for severe pain., Disp: 60 tablet, Rfl: 0 .  [START ON 02/20/2020] oxyCODONE-acetaminophen (ROXICET) 5-325 MG tablet, Take 1 tablet by mouth every 8 (eight) hours as needed for severe pain., Disp: 60 tablet, Rfl: 0 .  oxyCODONE-acetaminophen (ROXICET) 5-325 MG tablet, Take 1 tablet by mouth every 8 (eight) hours as needed for severe pain., Disp: 60 tablet, Rfl: 0 .  traZODone (DESYREL) 100 MG tablet, Take 1 tablet (100 mg total) by mouth at bedtime. NOV to est with new MD, Disp: 90 tablet, Rfl: 0 .  triamcinolone cream (KENALOG) 0.1 %, Apply topically 2 (two) times daily., Disp: 453.6 g, Rfl: 5 .  oxyCODONE-acetaminophen (PERCOCET) 5-325 MG tablet, Take 1 tablet by mouth every 8 (eight) hours as needed for up  to 7 days for severe pain., Disp: 21 tablet, Rfl: 0  Allergies  Allergen Reactions  . Sulfonamide Derivatives Rash     ROS: Review of Systems A comprehensive review of systems was negative except for: Ears, nose, mouth, throat, and face: positive for Lichen planus, treated with corticosteroid gel Gastrointestinal: positive for GERD but this is well controlled with PPI twice daily. Musculoskeletal: positive for arthralgias and back pain    Physical exam BP 128/65   Pulse 64   Temp 97.6 F (36.4 C)   Ht 5' 3.5" (1.613 m)   Wt 240 lb 12.8 oz (109.2 kg)   SpO2 97%   BMI 41.99 kg/m  General appearance: alert, cooperative, appears stated age, no distress and morbidly obese Head: Normocephalic, without obvious abnormality, atraumatic Eyes: negative findings: lids and lashes normal, conjunctivae and sclerae normal, corneas clear and pupils equal, round, reactive to light and accomodation Ears: Normal externally Nose: Nares normal. Septum midline. Mucosa normal. No drainage or sinus tenderness. Throat: lips, mucosa, and tongue normal; teeth and gums normal Neck: no adenopathy, supple, symmetrical, trachea midline and thyroid not enlarged, symmetric, no tenderness/mass/nodules Back: Postsurgical changes appreciated.  Patient ambulates independently.  Gait is slow Lungs: clear to auscultation bilaterally Heart: regular rate and rhythm, S1, S2 normal, no murmur, click, rub or gallop Abdomen: Mild epigastric tenderness to palpation Extremities: Mild nonpitting edema noted at the ankles.  Warm, well perfused Pulses: 2+ and symmetric Skin: Skin color, texture, turgor normal. No rashes or lesions Lymph nodes: Cervical, supraclavicular, and axillary nodes normal. Neurologic: Grossly normal Psych: Mood stable, speech normal, affect appropriate, pleasant and interactive  Depression screen [facility] 2/9 01/22/2020 04/06/2019 10/06/2018  Decreased Interest 0 0 0  Down, Depressed, Hopeless 0  0 0  PHQ -  2 Score 0 0 0   Assessment/ Plan: Valerie Baker here for annual physical exam.   1. Annual physical exam  2. Establishing care with new doctor, encounter for  3. Morbid obesity ([place]) Working on weight loss but having some difficulty. - CMP14+EGFR - TSH - Bayer DCA Hb A1c Waived - Lipid Panel  4. Disc degeneration, lumbosacral She is going to try meloxicam at 7.5 mg a daily given history of gastritis, use of SSRI.  The national narcotic database was reviewed and there were no red flags.  Apparently given the fact that she has not had medication refilled in several months her insurance will pay for a max of 7-day supply.  A 7-day prescription has been sent and then I have given her 3 additional months to have on file.  She uses this very sparingly and I have updated her prescription to reflect current use.  UDS and controlled substance contract were updated today as per office policy.  She will see me back in 3 months, sooner if needed - ToxASSURE Select 13 (MW), Urine - oxyCODONE-acetaminophen (PERCOCET/ROXICET) 5-325 MG tablet; Take 1 tablet by mouth every 8 (eight) hours as needed for severe pain.  Dispense: 60 tablet; Refill: 0 - oxyCODONE-acetaminophen (ROXICET) 5-325 MG tablet; Take 1 tablet by mouth every 8 (eight) hours as needed for severe pain.  Dispense: 60 tablet; Refill: 0 - oxyCODONE-acetaminophen (ROXICET) 5-325 MG tablet; Take 1 tablet by mouth every 8 (eight) hours as needed for severe pain.  Dispense: 60 tablet; Refill: 0  5. Chronic, continuous use of opioids - ToxASSURE Select 13 (MW), Urine  6. Controlled substance agreement signed - ToxASSURE Select 13 (MW), Urine  7. Pure hypercholesterolemia Nonfasting lipid panel obtained - CMP14+EGFR - TSH - Lipid Panel  8. Encounter for hepatitis C screening test for low risk patient - Hepatitis C antibody  9. Screening for HIV (human immunodeficiency virus) - HIV antibody (with reflex)  10. Primary  osteoarthritis of both knees Referral to Dr. Graciella Freer office placed per her request - Ambulatory referral to Orthopedic Surgery  11. Screen for colon cancer Referral back to [facility] placed - Ambulatory referral to Gastroenterology   Counseled on healthy lifestyle choices, including diet (rich in fruits, vegetables and lean meats and low in salt and simple carbohydrates) and exercise (at least 30 minutes of moderate physical activity daily).  Patient to follow up in 1 year for annual exam or sooner if needed.  Eliza Grissinger M. Lajuana Ripple, DO

## 2020-01-22 NOTE — Telephone Encounter (Signed)
There were 3 rx sent.  Ok to fill #7 days from first rx.  Please let patient know that this is the case.  She doesn't use very often.

## 2020-01-22 NOTE — Telephone Encounter (Signed)
Per Aaron Edelman at pharmacy, it must be a written 7 day rx?

## 2020-01-22 NOTE — Patient Instructions (Addendum)
You had labs performed today.  You will be contacted with the results of the labs once they are available, usually in the next 3 business days for routine lab work.  If you have an active my chart account, they will be released to your MyChart.  If you prefer to have these labs released to you via telephone, please let us know.  The database says you are overdue for colonoscopy.  I'm placing a referral back to [facility] to get you set up.  Controlled Substance Guidelines:  1. You cannot get an early refill, even it is lost.  2. You cannot get controlled medications from any other doctor, unless it is the emergency department and related to a new problem or injury.  3. You cannot use alcohol, marijuana, cocaine or any other recreational drugs while using this medication. This is very dangerous.  4. You are willing to have your urine drug tested at each visit.  5. You will not drive while using this medication, because that can put yourself and others in serious danger of an accident. 6. If any medication is stolen, then there must be a police report to verify it, or it cannot be refilled.  7. I will not prescribe these medications for longer than 3 months.  8. You must bring your pill bottle to each visit.  9. You must use the same pharmacy for all refills for the medication, unless you clear it with me beforehand.  10. You cannot share or sell this medication.    Preventive Care 11-51 Years Old, Female Preventive care refers to visits with your health care provider and lifestyle choices that can promote health and wellness. This includes:  A yearly physical exam. This may also be called an annual well check.  Regular dental visits and eye exams.  Immunizations.  Screening for certain conditions.  Healthy lifestyle choices, such as eating a healthy diet, getting regular exercise, not using drugs or products that contain nicotine and tobacco, and limiting alcohol use. What can I expect for  my preventive care visit? Physical exam Your health care provider will check your:  Height and weight. This may be used to calculate body mass index (BMI), which tells if you are at a healthy weight.  Heart rate and blood pressure.  Skin for abnormal spots. Counseling Your health care provider may ask you questions about your:  Alcohol, tobacco, and drug use.  Emotional well-being.  Home and relationship well-being.  Sexual activity.  Eating habits.  Work and work Statistician.  Method of birth control.  Menstrual cycle.  Pregnancy history. What immunizations do I need?  Influenza (flu) vaccine  This is recommended every year. Tetanus, diphtheria, and pertussis (Tdap) vaccine  You may need a Td booster every 10 years. Varicella (chickenpox) vaccine  You may need this if you have not been vaccinated. Zoster (shingles) vaccine  You may need this after age 61. Measles, mumps, and rubella (MMR) vaccine  You may need at least one dose of MMR if you were born in 1957 or later. You may also need a second dose. Pneumococcal conjugate (PCV13) vaccine  You may need this if you have certain conditions and were not previously vaccinated. Pneumococcal polysaccharide (PPSV23) vaccine  You may need one or two doses if you smoke cigarettes or if you have certain conditions. Meningococcal conjugate (MenACWY) vaccine  You may need this if you have certain conditions. Hepatitis A vaccine  You may need this if you have certain conditions  or if you travel or work in places where you may be exposed to hepatitis A. Hepatitis B vaccine  You may need this if you have certain conditions or if you travel or work in places where you may be exposed to hepatitis B. Haemophilus influenzae type b (Hib) vaccine  You may need this if you have certain conditions. Human papillomavirus (HPV) vaccine  If recommended by your health care provider, you may need three doses over 6 months. You  may receive vaccines as individual doses or as more than one vaccine together in one shot (combination vaccines). Talk with your health care provider about the risks and benefits of combination vaccines. What tests do I need? Blood tests  Lipid and cholesterol levels. These may be checked every 5 years, or more frequently if you are over 87 years old.  Hepatitis C test.  Hepatitis B test. Screening  Lung cancer screening. You may have this screening every year starting at age 86 if you have a 30-pack-year history of smoking and currently smoke or have quit within the past 15 years.  Colorectal cancer screening. All adults should have this screening starting at age 36 and continuing until age 58. Your health care provider may recommend screening at age 36 if you are at increased risk. You will have tests every 1-10 years, depending on your results and the type of screening test.  Diabetes screening. This is done by checking your blood sugar (glucose) after you have not eaten for a while (fasting). You may have this done every 1-3 years.  Mammogram. This may be done every 1-2 years. Talk with your health care provider about when you should start having regular mammograms. This may depend on whether you have a family history of breast cancer.  BRCA-related cancer screening. This may be done if you have a family history of breast, ovarian, tubal, or peritoneal cancers.  Pelvic exam and Pap test. This may be done every 3 years starting at age 56. Starting at age 3, this may be done every 5 years if you have a Pap test in combination with an HPV test. Other tests  Sexually transmitted disease (STD) testing.  Bone density scan. This is done to screen for osteoporosis. You may have this scan if you are at high risk for osteoporosis. Follow these instructions at home: Eating and drinking  Eat a diet that includes fresh fruits and vegetables, whole grains, lean protein, and low-fat  dairy.  Take vitamin and mineral supplements as recommended by your health care provider.  Do not drink alcohol if: ? Your health care provider tells you not to drink. ? You are pregnant, may be pregnant, or are planning to become pregnant.  If you drink alcohol: ? Limit how much you have to 0-1 drink a day. ? Be aware of how much alcohol is in your drink. In the [place] one drink equals one 12 oz bottle of beer (355 mL), one 5 oz glass of wine (148 mL), or one 1 oz glass of hard liquor (44 mL). Lifestyle  Take daily care of your teeth and gums.  Stay active. Exercise for at least 30 minutes on 5 or more days each week.  Do not use any products that contain nicotine or tobacco, such as cigarettes, e-cigarettes, and chewing tobacco. If you need help quitting, ask your health care provider.  If you are sexually active, practice safe sex. Use a condom or other form of birth control (contraception) in order to  prevent pregnancy and STIs (sexually transmitted infections).  If told by your health care provider, take low-dose aspirin daily starting at age 43. What's next?  Visit your health care provider once a year for a well check visit.  Ask your health care provider how often you should have your eyes and teeth checked.  Stay up to date on all vaccines. This information is not intended to replace advice given to you by your health care provider. Make sure you discuss any questions you have with your health care provider. Document Revised: 04/10/2018 Document Reviewed: 04/10/2018 Elsevier Patient Education  2020 [employer].

## 2020-01-23 LAB — CMP14+EGFR
ALT: 25 IU/L (ref 0–32)
AST: 30 IU/L (ref 0–40)
Albumin/Globulin Ratio: 2.1 (ref 1.2–2.2)
Albumin: 4.4 g/dL (ref 3.8–4.8)
Alkaline Phosphatase: 95 IU/L (ref 48–121)
BUN/Creatinine Ratio: 27 (ref 12–28)
BUN: 20 mg/dL (ref 8–27)
Bilirubin Total: 0.3 mg/dL (ref 0.0–1.2)
CO2: 22 mmol/L (ref 20–29)
Calcium: 9.4 mg/dL (ref 8.7–10.3)
Chloride: 101 mmol/L (ref 96–106)
Creatinine, Ser: 0.73 mg/dL (ref 0.57–1.00)
GFR calc Af Amer: 101 mL/min/{1.73_m2} (ref >59)
GFR calc non Af Amer: 88 mL/min/{1.73_m2} (ref >59)
Globulin, Total: 2.1 g/dL (ref 1.5–4.5)
Glucose: 117 mg/dL — ABNORMAL HIGH (ref 65–99)
Potassium: 4.2 mmol/L (ref 3.5–5.2)
Sodium: 138 mmol/L (ref 134–144)
Total Protein: 6.5 g/dL (ref 6.0–8.5)

## 2020-01-23 LAB — LIPID PANEL
Chol/HDL Ratio: 2.8 ratio (ref 0.0–4.4)
Cholesterol, Total: 215 mg/dL — ABNORMAL HIGH (ref 100–199)
HDL: 76 mg/dL (ref >39)
LDL Chol Calc (NIH): 132 mg/dL — ABNORMAL HIGH (ref 0–99)
Triglycerides: 39 mg/dL (ref 0–149)
VLDL Cholesterol Cal: 7 mg/dL (ref 5–40)

## 2020-01-23 LAB — TSH: TSH: 0.804 u[IU]/mL (ref 0.450–4.500)

## 2020-01-23 LAB — HIV ANTIBODY (ROUTINE TESTING W REFLEX): HIV Screen 4th Generation wRfx: NONREACTIVE

## 2020-01-23 LAB — HEPATITIS C ANTIBODY: Hep C Virus Ab: <0.1 s/co ratio (ref 0.0–0.9)

## 2020-01-26 DIAGNOSIS — Z0289 Encounter for other administrative examinations: Secondary | ICD-10-CM

## 2020-01-27 LAB — TOXASSURE SELECT 13 (MW), URINE

## 2020-02-12 ENCOUNTER — Encounter: Payer: Self-pay | Admitting: Family Medicine

## 2020-02-12 ENCOUNTER — Ambulatory Visit (INDEPENDENT_AMBULATORY_CARE_PROVIDER_SITE_OTHER): Payer: Medicare Other | Admitting: Family Medicine

## 2020-02-12 ENCOUNTER — Other Ambulatory Visit: Payer: Self-pay

## 2020-02-12 VITALS — BP 148/85 | HR 50 | Temp 97.0°F | Ht 63.0 in | Wt 237.0 lb

## 2020-02-12 DIAGNOSIS — Z9889 Other specified postprocedural states: Secondary | ICD-10-CM | POA: Diagnosis not present

## 2020-02-12 DIAGNOSIS — G8929 Other chronic pain: Secondary | ICD-10-CM

## 2020-02-12 DIAGNOSIS — M25512 Pain in left shoulder: Secondary | ICD-10-CM

## 2020-02-12 MED ORDER — METHYLPREDNISOLONE ACETATE 40 MG/ML IJ SUSP
40.0000 mg | Freq: Once | INTRAMUSCULAR | Status: AC
  Administered 2020-02-12: 40 mg via INTRA_ARTICULAR

## 2020-02-12 NOTE — Addendum Note (Signed)
Addended byCarrolyn Leigh on: 02/12/2020 03:06 PM   Modules accepted: Orders

## 2020-02-12 NOTE — Progress Notes (Signed)
Subjective: CC: shoulder pain PCP: Janora Norlander, DO HCW:[id number] Valerie Baker is a 64 y.o. female presenting to clinic today for:  1. Shoulder pain Patient reports chronic left-sided shoulder pain that is been exacerbated over the last 2 months.  She has history of surgery on that side.  This was done with Dr. Mayer Camel in [place] many years ago.  She is unsure if he is still practicing.  She currently sees Dr. Anne Fu office for other joint issues and would like a referral there.  Pain is not well controlled with current medications.  It radiates into her upper back.  She does have a history of C-spine issues and was seen by her spine and scoliosis clinic about 3 weeks ago.  They obtained x-rays there but felt that the previous fusion was stable.  At this point no plan for MRI.  She reports tenderness to palpation to the left shoulder.  Denies any joint swelling, discoloration.   ROS: Per HPI  Allergies  Allergen Reactions  . Sulfonamide Derivatives Rash   Past Medical History:  Diagnosis Date  . Adenomatous colon polyp 06/2006  . Allergy    SINUS  . Anxiety   . Anxiety and depression   . Arthritis   . Asthma   . Bladder incontinence   . Blood transfusion   . Cataract   . Depression   . Fibromyalgia   . GERD (gastroesophageal reflux disease)   . Hyperlipidemia   . Hypertension   . IBS (irritable bowel syndrome)   . Kidney stones   . Migraines   . Obesity   . Osteopenia     Current Outpatient Medications:  .  albuterol (PROVENTIL HFA;VENTOLIN HFA) 108 (90 Base) MCG/ACT inhaler, Inhale 2 puffs into the lungs every 6 (six) hours as needed for wheezing or shortness of breath., Disp: 1 Inhaler, Rfl: 0 .  augmented betamethasone dipropionate (DIPROLENE-AF) 0.05 % cream, Apply topically 2 (two) times daily., Disp: 100 g, Rfl: 2 .  betamethasone valerate (VALISONE) 0.1 % cream, Apply topically 2 (two) times daily., Disp: , Rfl:  .  Calcium Carbonate-Vitamin D (CALCIUM  600+D) 600-400 MG-UNIT per tablet, Take 1 tablet by mouth 2 (two) times daily.  , Disp: , Rfl:  .  Cholecalciferol (VITAMIN D3) 2000 UNITS TABS, Take 1 tablet by mouth daily.  , Disp: , Rfl:  .  citalopram (CELEXA) 20 MG tablet, Take 1 tablet (20 mg total) by mouth daily., Disp: 90 tablet, Rfl: 0 .  clotrimazole-betamethasone (LOTRISONE) cream, Apply 1 application topically 2 (two) times daily., Disp: 30 g, Rfl: 0 .  diclofenac sodium (VOLTAREN) 1 % GEL, APPLY 4 GRAMS 4 TIMES DAILY AS NEEDED, Disp: 300 g, Rfl: 5 .  fluconazole (DIFLUCAN) 150 MG tablet, Take 1 tablet (150 mg total) by mouth every three (3) days as needed., Disp: 3 tablet, Rfl: 0 .  fluocinonide gel (LIDEX) 0.05 %, Apply topically 2 (two) times daily., Disp: 60 g, Rfl: 4 .  fluticasone (FLONASE) 50 MCG/ACT nasal spray, Place 2 sprays into both nostrils daily., Disp: 16 g, Rfl: 2 .  gabapentin (NEURONTIN) 400 MG capsule, Take 2 capsules (800 mg total) by mouth 3 (three) times daily., Disp: 540 capsule, Rfl: 0 .  loratadine (CLARITIN) 10 MG tablet, Take 10 mg by mouth daily as needed for allergies., Disp: , Rfl:  .  meclizine (ANTIVERT) 12.5 MG tablet, Take 1 tablet (12.5 mg total) by mouth 3 (three) times daily as needed for dizziness., Disp: 30 tablet,  Rfl: 0 .  meloxicam (MOBIC) 15 MG tablet, Take 1 tablet (15 mg total) by mouth daily., Disp: 90 tablet, Rfl: 0 .  Multiple Vitamin (MULTIVITAMIN) tablet, Take 1 tablet by mouth daily. , Disp: , Rfl:  .  omeprazole (PRILOSEC) 40 MG capsule, Take 1 capsule (40 mg total) by mouth 2 (two) times daily., Disp: 180 capsule, Rfl: 3 .  ondansetron (ZOFRAN) 4 MG tablet, Take 1 tablet (4 mg total) by mouth every 8 (eight) hours as needed for nausea or vomiting., Disp: 20 tablet, Rfl: 0 .  oxybutynin (DITROPAN) 5 MG tablet, Take 1 tablet (5 mg total) by mouth 2 (two) times daily., Disp: 180 tablet, Rfl: 0 .  [START ON 03/22/2020] oxyCODONE-acetaminophen (PERCOCET/ROXICET) 5-325 MG tablet, Take 1  tablet by mouth every 8 (eight) hours as needed for severe pain., Disp: 60 tablet, Rfl: 0 .  [START ON 02/20/2020] oxyCODONE-acetaminophen (ROXICET) 5-325 MG tablet, Take 1 tablet by mouth every 8 (eight) hours as needed for severe pain., Disp: 60 tablet, Rfl: 0 .  oxyCODONE-acetaminophen (ROXICET) 5-325 MG tablet, Take 1 tablet by mouth every 8 (eight) hours as needed for severe pain., Disp: 60 tablet, Rfl: 0 .  traZODone (DESYREL) 100 MG tablet, Take 1 tablet (100 mg total) by mouth at bedtime. NOV to est with new MD, Disp: 90 tablet, Rfl: 0 .  triamcinolone cream (KENALOG) 0.1 %, Apply topically 2 (two) times daily., Disp: 453.6 g, Rfl: 5 Social History   Socioeconomic History  . Marital status: Married    Spouse name: Not on file  . Number of children: 3  . Years of education: Not on file  . Highest education level: Not on file  Occupational History  . Occupation: Disabled  Tobacco Use  . Smoking status: Former Smoker    Years: 5.00    Quit date: 11/02/2005    Years since quitting: 14.2  . Smokeless tobacco: Never Used  Substance and Sexual Activity  . Alcohol use: No  . Drug use: No  . Sexual activity: Yes  Other Topics Concern  . Not on file  Social History Narrative  . Not on file   Social Determinants of Health   Financial Resource Strain:   . Difficulty of Paying Living Expenses:   Food Insecurity:   . Worried About Charity fundraiser in the Last Year:   . Arboriculturist in the Last Year:   Transportation Needs:   . Film/video editor (Medical):   Marland Kitchen Lack of Transportation (Non-Medical):   Physical Activity:   . Days of Exercise per Week:   . Minutes of Exercise per Session:   Stress:   . Feeling of Stress :   Social Connections:   . Frequency of Communication with Friends and Family:   . Frequency of Social Gatherings with Friends and Family:   . Attends Religious Services:   . Active Member of Clubs or Organizations:   . Attends Archivist  Meetings:   Marland Kitchen Marital Status:   Intimate Partner Violence:   . Fear of Current or Ex-Partner:   . Emotionally Abused:   Marland Kitchen Physically Abused:   . Sexually Abused:    Family History  Problem Relation Age of Onset  . Heart disease Mother   . Diabetes Mother   . Melanoma Mother        nose  . Diabetes Brother   . Cirrhosis Brother   . Hypertension Brother   . Stroke Father   .  Hypertension Sister   . Hypertension Brother     Objective: Office vital signs reviewed. BP (!) 148/85   Pulse (!) 50   Temp (!) 97 F (36.1 C) (Temporal)   Ht 5\' 3"  (1.6 m)   Wt 237 lb (107.5 kg)   SpO2 98%   BMI 41.98 kg/m   Physical Examination:  General: Awake, alert, No acute distress MSK: Pain with abduction and extension of the left shoulder.  There are no gross deformities appreciated.  She has tenderness to palpation to the anterior and posterior aspect of the shoulder.  No erythema, increased warmth or appreciable joint effusions  JOINT INJECTION:  Patient denies allergy to antiseptics (including iodine) and anesthetics.  Patient with no h/o diabetes, frequent steroid use, use of blood thinners/ antiplatelets.  Patient was given informed consent and a signed copy has been placed in the chart. Appropriate time out was taken. Area prepped and draped in usual sterile fashion. Anatomic landmarks were identified and injection site was marked.  Ethyl chloride spray was used to numb the area and 1 cc of methylprednisolone 40 mg/ml plus  3 cc of 1% lidocaine without epinephrine was injected into the left shoulder using a(n) posterior approach. The patient tolerated the procedure well and there were no immediate complications. Estimated blood loss is less than 1 cc.  Post procedure instructions were reviewed and handout outlining these instructions were provided to patient.   Assessment/ Plan: 64 y.o. female   1. Chronic left shoulder pain Corticosteroid injection administered today.  Continue  current pain regimen.  Red flags discussed with the patient.  She voiced good understanding.  Referral to orthopedics placed.  She will follow-up as needed - Ambulatory referral to Orthopedic Surgery  2. History of shoulder surgery - Ambulatory referral to Orthopedic Surgery   No orders of the defined types were placed in this encounter.  No orders of the defined types were placed in this encounter.    Janora Norlander, DO [place] [phone]

## 2020-03-01 DIAGNOSIS — Z96652 Presence of left artificial knee joint: Secondary | ICD-10-CM | POA: Insufficient documentation

## 2020-03-03 DIAGNOSIS — M1711 Unilateral primary osteoarthritis, right knee: Secondary | ICD-10-CM | POA: Diagnosis not present

## 2020-03-03 DIAGNOSIS — M25561 Pain in right knee: Secondary | ICD-10-CM | POA: Diagnosis not present

## 2020-03-03 DIAGNOSIS — Z471 Aftercare following joint replacement surgery: Secondary | ICD-10-CM | POA: Diagnosis not present

## 2020-03-03 DIAGNOSIS — Z96652 Presence of left artificial knee joint: Secondary | ICD-10-CM | POA: Diagnosis not present

## 2020-03-07 DIAGNOSIS — M25512 Pain in left shoulder: Secondary | ICD-10-CM | POA: Diagnosis not present

## 2020-03-07 DIAGNOSIS — M7541 Impingement syndrome of right shoulder: Secondary | ICD-10-CM | POA: Diagnosis not present

## 2020-03-07 DIAGNOSIS — M25511 Pain in right shoulder: Secondary | ICD-10-CM | POA: Diagnosis not present

## 2020-03-07 DIAGNOSIS — M7542 Impingement syndrome of left shoulder: Secondary | ICD-10-CM | POA: Diagnosis not present

## 2020-03-17 ENCOUNTER — Other Ambulatory Visit: Payer: Self-pay | Admitting: *Deleted

## 2020-03-18 MED ORDER — BACLOFEN 10 MG PO TABS: 5.0000 mg | ORAL_TABLET | Freq: Three times a day (TID) | ORAL | 0 refills | Status: DC | PRN

## 2020-04-22 ENCOUNTER — Encounter: Payer: Self-pay | Admitting: Family Medicine

## 2020-04-22 ENCOUNTER — Other Ambulatory Visit: Payer: Self-pay

## 2020-04-22 ENCOUNTER — Ambulatory Visit (INDEPENDENT_AMBULATORY_CARE_PROVIDER_SITE_OTHER): Payer: Medicare Other | Admitting: Family Medicine

## 2020-04-22 VITALS — BP 136/82 | HR 72 | Temp 97.8°F | Ht 63.0 in | Wt 234.8 lb

## 2020-04-22 DIAGNOSIS — M25512 Pain in left shoulder: Secondary | ICD-10-CM

## 2020-04-22 DIAGNOSIS — F1190 Opioid use, unspecified, uncomplicated: Secondary | ICD-10-CM

## 2020-04-22 DIAGNOSIS — M5137 Other intervertebral disc degeneration, lumbosacral region: Secondary | ICD-10-CM

## 2020-04-22 DIAGNOSIS — L989 Disorder of the skin and subcutaneous tissue, unspecified: Secondary | ICD-10-CM | POA: Diagnosis not present

## 2020-04-22 DIAGNOSIS — G8929 Other chronic pain: Secondary | ICD-10-CM

## 2020-04-22 MED ORDER — OXYCODONE-ACETAMINOPHEN 7.5-325 MG PO TABS: 1.0000 | ORAL_TABLET | Freq: Two times a day (BID) | ORAL | 0 refills | Status: DC | PRN

## 2020-04-22 NOTE — Progress Notes (Signed)
Concerns today include: 1. Chronic pain/ pain management Patient with chronic back pain that extends from the neck down into the sacrum.  She has history of fusions on several levels.  She sees Dr. Patrice Paradise for her back pain. She also has chronic bilateral knee pain.  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 Ninfa Linden.  She would like to actually see Dr. Ricki Rodriguez for her right knee.    While she continues to use the Percocet sparingly, she does not find that the 5 mg is helping much with her pain anymore.  She did not even feel the last bottle Percocet because she has not found it to be especially helpful as of late.  She continues to have normal bowel movements with use of stool softeners.  Denies any falls, mental status changes.  There are plans for right knee replacement soon.  2.  Skin Patient reports multiple lesions on the skin that she would like to see a dermatologist for at [facility] dermatology, to remove and/or evaluate.  She has crusting of the lesion on the right forearm.  Does not report any spontaneous bleeding.   ROS: Per HPI  Allergies  Allergen Reactions  . Sulfonamide Derivatives Rash   Past Medical History:  Diagnosis Date  . Adenomatous colon polyp 06/2006  . Allergy    SINUS  . Anxiety   . Anxiety and depression   . Arthritis   . Asthma   . Bladder incontinence   . Blood transfusion   . Cataract   . Depression   . Fibromyalgia   . GERD (gastroesophageal reflux disease)   . Hyperlipidemia   . Hypertension   . IBS (irritable bowel syndrome)   . Kidney stones   . Migraines   . Obesity   . Osteopenia     Current Outpatient Medications:  .  albuterol (PROVENTIL HFA;VENTOLIN HFA) 108 (90 Base) MCG/ACT inhaler, Inhale 2 puffs into the lungs every 6 (six) hours as needed for wheezing or shortness of breath., Disp: 1 Inhaler, Rfl: 0 .  augmented betamethasone dipropionate (DIPROLENE-AF) 0.05 % cream, Apply topically 2 (two) times  daily., Disp: 100 g, Rfl: 2 .  baclofen (LIORESAL) 10 MG tablet, Take 0.5-1 tablets (5-10 mg total) by mouth 3 (three) times daily as needed for muscle spasms., Disp: 270 tablet, Rfl: 0 .  betamethasone valerate (VALISONE) 0.1 % cream, Apply topically 2 (two) times daily., Disp: , Rfl:  .  Calcium Carbonate-Vitamin D (CALCIUM 600+D) 600-400 MG-UNIT per tablet, Take 1 tablet by mouth 2 (two) times daily.  , Disp: , Rfl:  .  Cholecalciferol (VITAMIN D3) 2000 UNITS TABS, Take 1 tablet by mouth daily.  , Disp: , Rfl:  .  citalopram (CELEXA) 20 MG tablet, Take 1 tablet (20 mg total) by mouth daily., Disp: 90 tablet, Rfl: 0 .  clotrimazole-betamethasone (LOTRISONE) cream, Apply 1 application topically 2 (two) times daily., Disp: 30 g, Rfl: 0 .  diclofenac sodium (VOLTAREN) 1 % GEL, APPLY 4 GRAMS 4 TIMES DAILY AS NEEDED, Disp: 300 g, Rfl: 5 .  fluconazole (DIFLUCAN) 150 MG tablet, Take 1 tablet (150 mg total) by mouth every three (3) days as needed., Disp: 3 tablet, Rfl: 0 .  fluocinonide gel (LIDEX) 0.05 %, Apply topically 2 (two) times daily., Disp: 60 g, Rfl: 4 .  fluticasone (FLONASE) 50 MCG/ACT nasal spray, Place 2 sprays into both nostrils daily., Disp: 16 g, Rfl: 2 .  gabapentin (NEURONTIN) 400 MG capsule,  Take 2 capsules (800 mg total) by mouth 3 (three) times daily., Disp: 540 capsule, Rfl: 0 .  loratadine (CLARITIN) 10 MG tablet, Take 10 mg by mouth daily as needed for allergies., Disp: , Rfl:  .  meclizine (ANTIVERT) 12.5 MG tablet, Take 1 tablet (12.5 mg total) by mouth 3 (three) times daily as needed for dizziness., Disp: 30 tablet, Rfl: 0 .  meloxicam (MOBIC) 15 MG tablet, Take 1 tablet (15 mg total) by mouth daily., Disp: 90 tablet, Rfl: 0 .  Multiple Vitamin (MULTIVITAMIN) tablet, Take 1 tablet by mouth daily. , Disp: , Rfl:  .  omeprazole (PRILOSEC) 40 MG capsule, Take 1 capsule (40 mg total) by mouth 2 (two) times daily., Disp: 180 capsule, Rfl: 3 .  ondansetron (ZOFRAN) 4 MG tablet, Take  1 tablet (4 mg total) by mouth every 8 (eight) hours as needed for nausea or vomiting., Disp: 20 tablet, Rfl: 0 .  oxybutynin (DITROPAN) 5 MG tablet, Take 1 tablet (5 mg total) by mouth 2 (two) times daily., Disp: 180 tablet, Rfl: 0 .  oxyCODONE-acetaminophen (PERCOCET/ROXICET) 5-325 MG tablet, Take 1 tablet by mouth every 8 (eight) hours as needed for severe pain., Disp: 60 tablet, Rfl: 0 .  oxyCODONE-acetaminophen (ROXICET) 5-325 MG tablet, Take 1 tablet by mouth every 8 (eight) hours as needed for severe pain., Disp: 60 tablet, Rfl: 0 .  oxyCODONE-acetaminophen (ROXICET) 5-325 MG tablet, Take 1 tablet by mouth every 8 (eight) hours as needed for severe pain., Disp: 60 tablet, Rfl: 0 .  traZODone (DESYREL) 100 MG tablet, Take 1 tablet (100 mg total) by mouth at bedtime. NOV to est with new MD, Disp: 90 tablet, Rfl: 0 .  triamcinolone cream (KENALOG) 0.1 %, Apply topically 2 (two) times daily., Disp: 453.6 g, Rfl: 5 Social History   Socioeconomic History  . Marital status: Married    Spouse name: Not on file  . Number of children: 3  . Years of education: Not on file  . Highest education level: Not on file  Occupational History  . Occupation: Disabled  Tobacco Use  . Smoking status: Former Smoker    Years: 5.00    Quit date: 11/02/2005    Years since quitting: 14.4  . Smokeless tobacco: Never Used  Substance and Sexual Activity  . Alcohol use: No  . Drug use: No  . Sexual activity: Yes  Other Topics Concern  . Not on file  Social History Narrative  . Not on file   Social Determinants of Health   Financial Resource Strain:   . Difficulty of Paying Living Expenses: Not on file  Food Insecurity:   . Worried About Charity fundraiser in the Last Year: Not on file  . Ran Out of Food in the Last Year: Not on file  Transportation Needs:   . Lack of Transportation (Medical): Not on file  . Lack of Transportation (Non-Medical): Not on file  Physical Activity:   . Days of Exercise  per Week: Not on file  . Minutes of Exercise per Session: Not on file  Stress:   . Feeling of Stress : Not on file  Social Connections:   . Frequency of Communication with Friends and Family: Not on file  . Frequency of Social Gatherings with Friends and Family: Not on file  . Attends Religious Services: Not on file  . Active Member of Clubs or Organizations: Not on file  . Attends Archivist Meetings: Not on file  . Marital  Status: Not on file  Intimate Partner Violence:   . Fear of Current or Ex-Partner: Not on file  . Emotionally Abused: Not on file  . Physically Abused: Not on file  . Sexually Abused: Not on file   Family History  Problem Relation Age of Onset  . Heart disease Mother   . Diabetes Mother   . Melanoma Mother        nose  . Diabetes Brother   . Cirrhosis Brother   . Hypertension Brother   . Stroke Father   . Hypertension Sister   . Hypertension Brother     Objective: Office vital signs reviewed. There were no vitals taken for this visit.  Physical Examination:  General: Awake, alert, morbidly obese.  No acute distress Cardio: Regular rate and rhythm; no murmurs Pulmonary: Clear to auscultation bilaterally.  Normal breathing on room air. MSK: Antalgic gait.  She has postsurgical changes noted. Skin: She has a bean size scaly, hyperpigmented lesion noted along the dorsal aspect of the right forearm.  This is a raised lesion.  Multiple pigmented nevi noted throughout the body.  Assessment/ Plan: 64 y.o. female   1. Disc degeneration, lumbosacral Percocet 5 mg discontinued.  Percocet 7.5 mg prescribed but frequency reduced to twice daily.  The national narcotic database was reviewed and there were no red flags.  She will follow me in 4 weeks, sooner if needed  2. Chronic left shoulder pain As above  3. Chronic, continuous use of opioids  4. Skin lesion Referral to dermatology.  Many of these lesions appear to be seborrheic keratoses but  the right forearm does have a crusting lesion that could be consistent with a squamous cell carcinoma. - Ambulatory referral to Dermatology   No orders of the defined types were placed in this encounter.  No orders of the defined types were placed in this encounter.    Janora Norlander, DO [place] [phone]

## 2020-04-26 DIAGNOSIS — M25512 Pain in left shoulder: Secondary | ICD-10-CM | POA: Diagnosis not present

## 2020-04-26 DIAGNOSIS — M4325 Fusion of spine, thoracolumbar region: Secondary | ICD-10-CM | POA: Diagnosis not present

## 2020-04-26 DIAGNOSIS — G894 Chronic pain syndrome: Secondary | ICD-10-CM | POA: Diagnosis not present

## 2020-04-26 DIAGNOSIS — M4323 Fusion of spine, cervicothoracic region: Secondary | ICD-10-CM | POA: Diagnosis not present

## 2020-04-26 DIAGNOSIS — M25511 Pain in right shoulder: Secondary | ICD-10-CM | POA: Diagnosis not present

## 2020-04-29 ENCOUNTER — Other Ambulatory Visit: Payer: Self-pay | Admitting: Family Medicine

## 2020-04-29 DIAGNOSIS — F339 Major depressive disorder, recurrent, unspecified: Secondary | ICD-10-CM

## 2020-04-29 DIAGNOSIS — R350 Frequency of micturition: Secondary | ICD-10-CM

## 2020-04-29 DIAGNOSIS — K219 Gastro-esophageal reflux disease without esophagitis: Secondary | ICD-10-CM

## 2020-04-29 DIAGNOSIS — M5137 Other intervertebral disc degeneration, lumbosacral region: Secondary | ICD-10-CM

## 2020-05-02 ENCOUNTER — Other Ambulatory Visit: Payer: Self-pay | Admitting: Family Medicine

## 2020-05-02 DIAGNOSIS — F339 Major depressive disorder, recurrent, unspecified: Secondary | ICD-10-CM

## 2020-05-04 ENCOUNTER — Other Ambulatory Visit: Payer: Self-pay

## 2020-05-04 ENCOUNTER — Other Ambulatory Visit: Payer: Self-pay | Admitting: Orthopaedic Surgery

## 2020-05-04 ENCOUNTER — Emergency Department (HOSPITAL_COMMUNITY): Payer: Medicare Other

## 2020-05-04 ENCOUNTER — Emergency Department (HOSPITAL_COMMUNITY)
Admission: EM | Admit: 2020-05-04 | Discharge: 2020-05-04 | Disposition: A | Discharge status: Home / Self Care | Payer: Medicare Other | Attending: Emergency Medicine | Admitting: Emergency Medicine

## 2020-05-04 ENCOUNTER — Encounter (HOSPITAL_COMMUNITY): Payer: Self-pay

## 2020-05-04 ENCOUNTER — Telehealth: Payer: Self-pay | Admitting: Nurse Practitioner

## 2020-05-04 DIAGNOSIS — J45909 Unspecified asthma, uncomplicated: Secondary | ICD-10-CM | POA: Insufficient documentation

## 2020-05-04 DIAGNOSIS — S22040A Wedge compression fracture of fourth thoracic vertebra, initial encounter for closed fracture: Secondary | ICD-10-CM | POA: Diagnosis not present

## 2020-05-04 DIAGNOSIS — R079 Chest pain, unspecified: Secondary | ICD-10-CM | POA: Diagnosis not present

## 2020-05-04 DIAGNOSIS — R6889 Other general symptoms and signs: Secondary | ICD-10-CM | POA: Diagnosis not present

## 2020-05-04 DIAGNOSIS — M545 Low back pain: Secondary | ICD-10-CM | POA: Diagnosis not present

## 2020-05-04 DIAGNOSIS — M4323 Fusion of spine, cervicothoracic region: Secondary | ICD-10-CM

## 2020-05-04 DIAGNOSIS — R0689 Other abnormalities of breathing: Secondary | ICD-10-CM | POA: Diagnosis not present

## 2020-05-04 DIAGNOSIS — R52 Pain, unspecified: Secondary | ICD-10-CM | POA: Diagnosis not present

## 2020-05-04 DIAGNOSIS — I10 Essential (primary) hypertension: Secondary | ICD-10-CM | POA: Diagnosis not present

## 2020-05-04 DIAGNOSIS — Z743 Need for continuous supervision: Secondary | ICD-10-CM | POA: Diagnosis not present

## 2020-05-04 DIAGNOSIS — Z87891 Personal history of nicotine dependence: Secondary | ICD-10-CM | POA: Insufficient documentation

## 2020-05-04 DIAGNOSIS — S24109A Unspecified injury at unspecified level of thoracic spinal cord, initial encounter: Secondary | ICD-10-CM | POA: Diagnosis present

## 2020-05-04 DIAGNOSIS — Z96659 Presence of unspecified artificial knee joint: Secondary | ICD-10-CM | POA: Diagnosis not present

## 2020-05-04 DIAGNOSIS — R0789 Other chest pain: Secondary | ICD-10-CM | POA: Diagnosis not present

## 2020-05-04 DIAGNOSIS — M549 Dorsalgia, unspecified: Secondary | ICD-10-CM

## 2020-05-04 DIAGNOSIS — M542 Cervicalgia: Secondary | ICD-10-CM | POA: Diagnosis not present

## 2020-05-04 DIAGNOSIS — M546 Pain in thoracic spine: Secondary | ICD-10-CM | POA: Diagnosis not present

## 2020-05-04 MED ORDER — OXYCODONE-ACETAMINOPHEN 5-325 MG PO TABS
2.0000 | ORAL_TABLET | Freq: Once | ORAL | Status: AC
  Administered 2020-05-04: 2 via ORAL
  Filled 2020-05-04: qty 2
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
## 2020-05-04 NOTE — Telephone Encounter (Signed)
Phone call to patient to verify medication list and allergies for myelogram procedure. Pt instructed to hold celexa and trazodone for 48hrs prior to myelogram appointment time. Pt verbalized understanding. Pre and post procedure instructions reviewed with pt.
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
## 2020-05-09 ENCOUNTER — Other Ambulatory Visit: Payer: Medicare Other

## 2020-05-10 DIAGNOSIS — R350 Frequency of micturition: Secondary | ICD-10-CM | POA: Diagnosis not present

## 2020-05-10 NOTE — Discharge Instructions (Signed)
Myelogram Discharge Instructions  1. Go home and rest quietly for the next 24 hours.  It is important to lie flat for the next 24 hours.  Get up only to go to the restroom.  You may lie in the bed or on a couch on your back, your stomach, your left side or your right side.  You may have one pillow under your head.  You may have pillows between your knees while you are on your side or under your knees while you are on your back.  2. DO NOT drive today.  Recline the seat as far back as it will go, while still wearing your seat belt, on the way home.  3. You may get up to go to the bathroom as needed.  You may sit up for 10 minutes to eat.  You may resume your normal diet and medications unless otherwise indicated.  Drink lots of extra fluids today and tomorrow.  4. The incidence of headache, nausea, or vomiting is about 5% (one in 20 patients).  If you develop a headache, lie flat and drink plenty of fluids until the headache goes away.  Caffeinated beverages may be helpful.  If you develop severe nausea and vomiting or a headache that does not go away with flat bed rest, call [phone].  5. You may resume normal activities after your 24 hours of bed rest is over; however, do not exert yourself strongly or do any heavy lifting tomorrow. If when you get up you have a headache when standing, go back to bed and force fluids for another 24 hours.  6. Call your physician for a follow-up appointment.  The results of your myelogram will be sent directly to your physician by the following day.  7. If you have any questions or if complications develop after you arrive home, please call [phone].  Discharge instructions have been explained to the patient.  The patient, or the person responsible for the patient, fully understands these instructions.  YOU MAY RESTART YOUR CITALOPRAM AND TRAZODONE TOMORROW 05/12/20 AT 09:30AM.

## 2020-05-11 ENCOUNTER — Ambulatory Visit
Admission: RE | Admit: 2020-05-11 | Discharge: 2020-05-11 | Disposition: A | Discharge status: Home / Self Care | Payer: Medicare Other | Source: Ambulatory Visit | Attending: Orthopaedic Surgery | Admitting: Orthopaedic Surgery

## 2020-05-11 DIAGNOSIS — M4312 Spondylolisthesis, cervical region: Secondary | ICD-10-CM | POA: Diagnosis not present

## 2020-05-11 DIAGNOSIS — M4323 Fusion of spine, cervicothoracic region: Secondary | ICD-10-CM

## 2020-05-11 DIAGNOSIS — M4322 Fusion of spine, cervical region: Secondary | ICD-10-CM | POA: Diagnosis not present

## 2020-05-11 DIAGNOSIS — M4802 Spinal stenosis, cervical region: Secondary | ICD-10-CM | POA: Diagnosis not present

## 2020-05-11 MED ORDER — ONDANSETRON HCL 4 MG/2ML IJ SOLN
4.0000 mg | Freq: Once | INTRAMUSCULAR | Status: AC
  Administered 2020-05-11: 4 mg via INTRAMUSCULAR

## 2020-05-11 MED ORDER — IOPAMIDOL (ISOVUE-M 300) INJECTION 61%
10.0000 mL | Freq: Once | INTRAMUSCULAR | Status: AC | PRN
  Administered 2020-05-11: 10 mL via INTRATHECAL

## 2020-05-11 MED ORDER — MEPERIDINE HCL 100 MG/ML IJ SOLN
100.0000 mg | Freq: Once | INTRAMUSCULAR | Status: AC
  Administered 2020-05-11: 100 mg via INTRAMUSCULAR

## 2020-05-11 MED ORDER — DIPHENHYDRAMINE HCL 50 MG/ML IJ SOLN
25.0000 mg | Freq: Once | INTRAMUSCULAR | Status: AC
  Administered 2020-05-11: 25 mg via INTRAMUSCULAR

## 2020-05-11 MED ORDER — DIAZEPAM 5 MG PO TABS
10.0000 mg | ORAL_TABLET | Freq: Once | ORAL | Status: AC
  Administered 2020-05-11: 10 mg via ORAL

## 2020-05-11 NOTE — Discharge Instr - Other Orders (Signed)
Pt c/o pain related to procedure. 9/10, achy. See MAR.  Pt reports relief of pain but now complains of itching related to Demerol. Dr. Jeralyn Ruths aware and ordered Benadryl. See MAR.

## 2020-05-17 ENCOUNTER — Other Ambulatory Visit: Payer: Self-pay | Admitting: Family Medicine

## 2020-05-17 DIAGNOSIS — R350 Frequency of micturition: Secondary | ICD-10-CM

## 2020-05-17 DIAGNOSIS — F339 Major depressive disorder, recurrent, unspecified: Secondary | ICD-10-CM

## 2020-05-17 DIAGNOSIS — M5137 Other intervertebral disc degeneration, lumbosacral region: Secondary | ICD-10-CM

## 2020-05-17 DIAGNOSIS — K219 Gastro-esophageal reflux disease without esophagitis: Secondary | ICD-10-CM

## 2020-05-20 ENCOUNTER — Ambulatory Visit (INDEPENDENT_AMBULATORY_CARE_PROVIDER_SITE_OTHER): Payer: Medicare Other | Admitting: Family Medicine

## 2020-05-20 ENCOUNTER — Other Ambulatory Visit: Payer: Self-pay

## 2020-05-20 VITALS — BP 139/69 | HR 60 | Temp 97.0°F | Ht 64.4 in | Wt 244.0 lb

## 2020-05-20 DIAGNOSIS — L438 Other lichen planus: Secondary | ICD-10-CM

## 2020-05-20 DIAGNOSIS — M25512 Pain in left shoulder: Secondary | ICD-10-CM

## 2020-05-20 DIAGNOSIS — M5137 Other intervertebral disc degeneration, lumbosacral region: Secondary | ICD-10-CM | POA: Diagnosis not present

## 2020-05-20 DIAGNOSIS — H6983 Other specified disorders of Eustachian tube, bilateral: Secondary | ICD-10-CM | POA: Diagnosis not present

## 2020-05-20 DIAGNOSIS — F1190 Opioid use, unspecified, uncomplicated: Secondary | ICD-10-CM | POA: Diagnosis not present

## 2020-05-20 DIAGNOSIS — G8929 Other chronic pain: Secondary | ICD-10-CM

## 2020-05-20 MED ORDER — FLUOCINONIDE 0.05 % EX GEL: Freq: Two times a day (BID) | CUTANEOUS | 4 refills | Status: DC | PRN

## 2020-05-20 MED ORDER — OXYCODONE-ACETAMINOPHEN 7.5-325 MG PO TABS: 1.0000 | ORAL_TABLET | Freq: Two times a day (BID) | ORAL | 0 refills | Status: DC | PRN

## 2020-05-20 MED ORDER — FLUTICASONE PROPIONATE 50 MCG/ACT NA SUSP: 2.0000 | Freq: Every day | NASAL | 99 refills | Status: DC
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
## 2020-05-26 DIAGNOSIS — M25511 Pain in right shoulder: Secondary | ICD-10-CM | POA: Diagnosis not present

## 2020-05-26 DIAGNOSIS — M4323 Fusion of spine, cervicothoracic region: Secondary | ICD-10-CM | POA: Diagnosis not present

## 2020-05-26 DIAGNOSIS — G894 Chronic pain syndrome: Secondary | ICD-10-CM | POA: Diagnosis not present

## 2020-05-26 DIAGNOSIS — M4325 Fusion of spine, thoracolumbar region: Secondary | ICD-10-CM | POA: Diagnosis not present

## 2020-06-07 ENCOUNTER — Other Ambulatory Visit: Payer: Self-pay | Admitting: Family Medicine

## 2020-06-07 DIAGNOSIS — M5137 Other intervertebral disc degeneration, lumbosacral region: Secondary | ICD-10-CM

## 2020-06-07 DIAGNOSIS — K219 Gastro-esophageal reflux disease without esophagitis: Secondary | ICD-10-CM

## 2020-06-08 ENCOUNTER — Other Ambulatory Visit: Payer: Self-pay | Admitting: Family Medicine

## 2020-06-08 DIAGNOSIS — F339 Major depressive disorder, recurrent, unspecified: Secondary | ICD-10-CM

## 2020-06-13 DIAGNOSIS — M47812 Spondylosis without myelopathy or radiculopathy, cervical region: Secondary | ICD-10-CM | POA: Diagnosis not present

## 2020-06-16 ENCOUNTER — Ambulatory Visit (INDEPENDENT_AMBULATORY_CARE_PROVIDER_SITE_OTHER): Payer: Medicare Other

## 2020-06-16 DIAGNOSIS — Z23 Encounter for immunization: Secondary | ICD-10-CM

## 2020-07-17 ENCOUNTER — Other Ambulatory Visit: Payer: Self-pay | Admitting: Family Medicine

## 2020-07-17 DIAGNOSIS — R350 Frequency of micturition: Secondary | ICD-10-CM

## 2020-07-17 DIAGNOSIS — F339 Major depressive disorder, recurrent, unspecified: Secondary | ICD-10-CM

## 2020-07-17 DIAGNOSIS — M5137 Other intervertebral disc degeneration, lumbosacral region: Secondary | ICD-10-CM

## 2020-07-18 ENCOUNTER — Other Ambulatory Visit: Payer: Self-pay

## 2020-07-18 DIAGNOSIS — K219 Gastro-esophageal reflux disease without esophagitis: Secondary | ICD-10-CM

## 2020-07-18 DIAGNOSIS — M65331 Trigger finger, right middle finger: Secondary | ICD-10-CM | POA: Diagnosis not present

## 2020-07-18 MED ORDER — OMEPRAZOLE 40 MG PO CPDR: 40.0000 mg | DELAYED_RELEASE_CAPSULE | Freq: Two times a day (BID) | ORAL | 2 refills | Status: DC

## 2020-07-25 DIAGNOSIS — M25561 Pain in right knee: Secondary | ICD-10-CM | POA: Diagnosis not present

## 2020-07-28 ENCOUNTER — Ambulatory Visit (INDEPENDENT_AMBULATORY_CARE_PROVIDER_SITE_OTHER): Payer: Medicare Other | Admitting: Family

## 2020-07-28 ENCOUNTER — Encounter: Payer: Self-pay | Admitting: Family

## 2020-07-28 DIAGNOSIS — R059 Cough, unspecified: Secondary | ICD-10-CM

## 2020-07-28 DIAGNOSIS — R6889 Other general symptoms and signs: Secondary | ICD-10-CM | POA: Diagnosis not present

## 2020-07-28 MED ORDER — FLUTICASONE PROPIONATE 50 MCG/ACT NA SUSP: 2.0000 | Freq: Every day | NASAL | 6 refills | Status: DC

## 2020-07-28 MED ORDER — DEXAMETHASONE 6 MG PO TABS: 6.0000 mg | ORAL_TABLET | Freq: Two times a day (BID) | ORAL | 0 refills | Status: DC

## 2020-07-28 NOTE — Progress Notes (Signed)
Virtual Visit via telephone Note Due to COVID-19 pandemic this visit was conducted virtually. This visit type was conducted due to national recommendations for restrictions regarding the COVID-19 Pandemic (e.g. social distancing, sheltering in place) in an effort to limit this patient's exposure and mitigate transmission in our community. All issues noted in this document were discussed and addressed.  A physical exam was not performed with this format.  I connected with Valerie Baker on 07/28/20 at 10:35 AM  by telephone and verified that I am speaking with the correct person using two identifiers. [name] is currently located at home and no one is currently with her during visit. The provider, Evelina Dun, FNP is located in their office at time of visit.  I discussed the limitations, risks, security and privacy concerns of performing an evaluation and management service by telephone and the availability of in person appointments. I also discussed with the patient that there may be a patient responsible charge related to this service. The patient expressed understanding and agreed to proceed.   History and Present Illness:  Cough This is a new problem. The current episode started yesterday. The problem has been gradually worsening. The problem occurs every few minutes. The cough is non-productive. Associated symptoms include chills, ear congestion, ear pain and nasal congestion. Pertinent negatives include no fever, myalgias, rhinorrhea, sore throat or weight loss. She has tried OTC inhaler and rest for the symptoms. The treatment provided mild relief.      Review of Systems  Constitutional: Positive for chills. Negative for fever and weight loss.  HENT: Positive for ear pain. Negative for rhinorrhea and sore throat.   Respiratory: Positive for cough.   Musculoskeletal: Negative for myalgias.     Observations/Objective: No SOB or distress noted   Assessment and  Plan: 1. Cough - dexamethasone (DECADRON) 6 MG tablet; Take 1 tablet (6 mg total) by mouth 2 (two) times daily.  Dispense: 14 tablet; Refill: 0 - fluticasone (FLONASE) 50 MCG/ACT nasal spray; Place 2 sprays into both nostrils daily.  Dispense: 16 g; Refill: 6 - Novel Coronavirus, NAA ([facility])  2. Flu-like symptoms - dexamethasone (DECADRON) 6 MG tablet; Take 1 tablet (6 mg total) by mouth 2 (two) times daily.  Dispense: 14 tablet; Refill: 0 - fluticasone (FLONASE) 50 MCG/ACT nasal spray; Place 2 sprays into both nostrils daily.  Dispense: 16 g; Refill: 6 - Novel Coronavirus, NAA ([facility])  Pt will come and get COVID tested tomorrow to rule out If negative more than likely flu Rest Force fluids Tylenol as needed  Quarantine discussed  Call if symptoms worsen or do not improve    I discussed the assessment and treatment plan with the patient. The patient was provided an opportunity to ask questions and all were answered. The patient agreed with the plan and demonstrated an understanding of the instructions.   The patient was advised to call back or seek an in-person evaluation if the symptoms worsen or if the condition fails to improve as anticipated.  The above assessment and management plan was discussed with the patient. The patient verbalized understanding of and has agreed to the management plan. Patient is aware to call the clinic if symptoms persist or worsen. Patient is aware when to return to the clinic for a follow-up visit. Patient educated on when it is appropriate to go to the emergency department.   Time call ended: 10:47 AM    I provided 12 minutes of non-face-to-face time during this encounter.  Evelina Dun, FNP

## 2020-07-29 ENCOUNTER — Other Ambulatory Visit: Payer: Medicare Other

## 2020-07-29 DIAGNOSIS — R6889 Other general symptoms and signs: Secondary | ICD-10-CM | POA: Diagnosis not present

## 2020-07-29 DIAGNOSIS — R059 Cough, unspecified: Secondary | ICD-10-CM | POA: Diagnosis not present

## 2020-07-31 LAB — SARS-COV-2, NAA 2 DAY TAT

## 2020-07-31 LAB — NOVEL CORONAVIRUS, NAA: SARS-CoV-2, NAA: DETECTED — AB

## 2020-08-01 ENCOUNTER — Telehealth: Payer: Self-pay

## 2020-08-01 NOTE — Telephone Encounter (Signed)
See result note.  

## 2020-08-01 NOTE — Telephone Encounter (Signed)
Review results - provider has not reviewed

## 2020-08-09 ENCOUNTER — Telehealth: Payer: Self-pay

## 2020-08-09 DIAGNOSIS — B370 Candidal stomatitis: Secondary | ICD-10-CM

## 2020-08-09 MED ORDER — AZITHROMYCIN 250 MG PO TABS: ORAL_TABLET | ORAL | 0 refills | Status: DC

## 2020-08-09 MED ORDER — NYSTATIN 100000 UNIT/ML MT SUSP: 5.0000 mL | Freq: Four times a day (QID) | OROMUCOSAL | 0 refills | Status: DC

## 2020-08-09 NOTE — Telephone Encounter (Signed)
Zpak Prescription sent to pharmacy   

## 2020-08-09 NOTE — Addendum Note (Signed)
Addended by: Jannifer Rodney A on: 08/09/2020 04:30 PM   Modules accepted: Orders

## 2020-08-09 NOTE — Telephone Encounter (Signed)
Nystatin sent to pharmacy

## 2020-08-09 NOTE — Telephone Encounter (Signed)
Patient is aware.  Patient states that cough is worse and nothing is helping please advise what patient needs to do for cough.

## 2020-08-09 NOTE — Telephone Encounter (Signed)
Pt had a visit 12/17 and tested positive for COVID and was given decadron-states her tongue has blisters all over it and wanted to see if Magic Mouth Wash could be called in and also something for her cough. 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

## 2020-08-23 ENCOUNTER — Ambulatory Visit (INDEPENDENT_AMBULATORY_CARE_PROVIDER_SITE_OTHER): Payer: Medicare Other | Admitting: Family Medicine

## 2020-08-23 ENCOUNTER — Other Ambulatory Visit: Payer: Self-pay

## 2020-08-23 ENCOUNTER — Encounter: Payer: Self-pay | Admitting: Family Medicine

## 2020-08-23 VITALS — BP 140/73 | HR 59 | Temp 97.4°F | Ht 64.0 in | Wt 245.0 lb

## 2020-08-23 DIAGNOSIS — K219 Gastro-esophageal reflux disease without esophagitis: Secondary | ICD-10-CM | POA: Diagnosis not present

## 2020-08-23 DIAGNOSIS — R350 Frequency of micturition: Secondary | ICD-10-CM

## 2020-08-23 DIAGNOSIS — M25512 Pain in left shoulder: Secondary | ICD-10-CM | POA: Diagnosis not present

## 2020-08-23 DIAGNOSIS — F1190 Opioid use, unspecified, uncomplicated: Secondary | ICD-10-CM

## 2020-08-23 DIAGNOSIS — M5137 Other intervertebral disc degeneration, lumbosacral region: Secondary | ICD-10-CM

## 2020-08-23 DIAGNOSIS — G8929 Other chronic pain: Secondary | ICD-10-CM | POA: Diagnosis not present

## 2020-08-23 MED ORDER — DICLOFENAC SODIUM 1 % EX GEL: 4.0000 g | Freq: Four times a day (QID) | CUTANEOUS | 5 refills | Status: DC

## 2020-08-23 MED ORDER — OMEPRAZOLE 40 MG PO CPDR: 40.0000 mg | DELAYED_RELEASE_CAPSULE | Freq: Two times a day (BID) | ORAL | 3 refills | Status: DC

## 2020-08-23 MED ORDER — OXYBUTYNIN CHLORIDE 5 MG PO TABS: 5.0000 mg | ORAL_TABLET | Freq: Two times a day (BID) | ORAL | 3 refills | Status: DC

## 2020-08-23 MED ORDER — OXYCODONE-ACETAMINOPHEN 7.5-325 MG PO TABS: 1.0000 | ORAL_TABLET | Freq: Two times a day (BID) | ORAL | 0 refills | Status: DC | PRN

## 2020-08-23 MED ORDER — MELOXICAM 15 MG PO TABS: 15.0000 mg | ORAL_TABLET | Freq: Every day | ORAL | 3 refills | Status: DC

## 2020-08-23 MED ORDER — OXYCODONE-ACETAMINOPHEN 7.5-325 MG PO TABS
1.0000 | ORAL_TABLET | Freq: Two times a day (BID) | ORAL | 0 refills | Status: DC | PRN
Start: 2020-08-27 — End: 2020-12-21

## 2020-08-23 MED ORDER — GABAPENTIN 400 MG PO CAPS: ORAL_CAPSULE | ORAL | 3 refills | Status: DC
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
## 2020-09-19 ENCOUNTER — Other Ambulatory Visit: Payer: Self-pay | Admitting: Family Medicine

## 2020-09-19 DIAGNOSIS — F339 Major depressive disorder, recurrent, unspecified: Secondary | ICD-10-CM

## 2020-10-26 DIAGNOSIS — D225 Melanocytic nevi of trunk: Secondary | ICD-10-CM | POA: Diagnosis not present

## 2020-10-26 DIAGNOSIS — L565 Disseminated superficial actinic porokeratosis (DSAP): Secondary | ICD-10-CM | POA: Diagnosis not present

## 2020-10-26 DIAGNOSIS — L821 Other seborrheic keratosis: Secondary | ICD-10-CM | POA: Diagnosis not present

## 2020-10-26 DIAGNOSIS — D1801 Hemangioma of skin and subcutaneous tissue: Secondary | ICD-10-CM | POA: Diagnosis not present

## 2020-10-26 DIAGNOSIS — D2372 Other benign neoplasm of skin of left lower limb, including hip: Secondary | ICD-10-CM | POA: Diagnosis not present

## 2020-10-26 DIAGNOSIS — D485 Neoplasm of uncertain behavior of skin: Secondary | ICD-10-CM | POA: Diagnosis not present

## 2020-10-26 DIAGNOSIS — L570 Actinic keratosis: Secondary | ICD-10-CM | POA: Diagnosis not present

## 2020-10-26 DIAGNOSIS — D2261 Melanocytic nevi of right upper limb, including shoulder: Secondary | ICD-10-CM | POA: Diagnosis not present

## 2020-10-26 DIAGNOSIS — D0461 Carcinoma in situ of skin of right upper limb, including shoulder: Secondary | ICD-10-CM | POA: Diagnosis not present

## 2020-10-26 DIAGNOSIS — D2262 Melanocytic nevi of left upper limb, including shoulder: Secondary | ICD-10-CM | POA: Diagnosis not present

## 2020-10-26 DIAGNOSIS — D2272 Melanocytic nevi of left lower limb, including hip: Secondary | ICD-10-CM | POA: Diagnosis not present

## 2020-10-26 DIAGNOSIS — D2271 Melanocytic nevi of right lower limb, including hip: Secondary | ICD-10-CM | POA: Diagnosis not present

## 2020-10-28 ENCOUNTER — Other Ambulatory Visit: Payer: Self-pay | Admitting: Family Medicine

## 2020-10-28 ENCOUNTER — Other Ambulatory Visit: Payer: Self-pay

## 2020-10-28 ENCOUNTER — Ambulatory Visit
Admission: RE | Admit: 2020-10-28 | Discharge: 2020-10-28 | Disposition: A | Discharge status: Home / Self Care | Payer: Medicare Other | Source: Ambulatory Visit | Attending: Family Medicine | Admitting: Family Medicine

## 2020-10-28 DIAGNOSIS — Z1231 Encounter for screening mammogram for malignant neoplasm of breast: Secondary | ICD-10-CM | POA: Diagnosis not present

## 2020-11-02 ENCOUNTER — Encounter: Payer: Self-pay | Admitting: Gastroenterology

## 2020-11-17 ENCOUNTER — Ambulatory Visit (INDEPENDENT_AMBULATORY_CARE_PROVIDER_SITE_OTHER): Payer: Medicare Other | Admitting: Nurse Practitioner

## 2020-11-17 DIAGNOSIS — J40 Bronchitis, not specified as acute or chronic: Secondary | ICD-10-CM

## 2020-11-17 MED ORDER — PREDNISONE 20 MG PO TABS: 40.0000 mg | ORAL_TABLET | Freq: Every day | ORAL | 0 refills | Status: AC

## 2020-11-17 MED ORDER — BENZONATATE 100 MG PO CAPS: 100.0000 mg | ORAL_CAPSULE | Freq: Three times a day (TID) | ORAL | 0 refills | Status: DC | PRN

## 2020-11-17 NOTE — Progress Notes (Signed)
Virtual Visit  Note Due to COVID-19 pandemic this visit was conducted virtually. This visit type was conducted due to national recommendations for restrictions regarding the COVID-19 Pandemic (e.g. social distancing, sheltering in place) in an effort to limit this patient's exposure and mitigate transmission in our community. All issues noted in this document were discussed and addressed.  A physical exam was not performed with this format.  I connected with Valerie Baker on 11/17/20 at 1:10 by telephone and verified that I am speaking with the correct person using two identifiers. Valerie Baker is currently located at home and no one is currently with her during visit. The provider, Mary-Margaret Hassell Done, FNP is located in their office at time of visit.  I discussed the limitations, risks, security and privacy concerns of performing an evaluation and management service by telephone and the availability of in person appointments. I also discussed with the patient that there may be a patient responsible charge related to this service. The patient expressed understanding and agreed to proceed.   History and Present Illness:  Chief Complaint: Cough   HPI Patient calls in c/o cough that started 2 days ago. Is keeping her up at night. The cough is nonproductive. No SOB.    Review of Systems  Constitutional: Negative for chills, fever and malaise/fatigue.  HENT: Positive for congestion. Negative for ear discharge, ear pain, sinus pain and sore throat.   Respiratory: Positive for cough. Negative for sputum production and shortness of breath.   Musculoskeletal: Negative for myalgias.  Neurological: Negative for dizziness and headaches.     Observations/Objective: Alert and oriented- answers all questions appropriately No distress Voice hoarse Constant cough during visit.  Assessment and Plan: Valerie Baker in today with chief complaint of Cough   1.  Bronchitis 1. Take meds as prescribed 2. Use a cool mist humidifier especially during the winter months and when heat has been humid. 3. Use saline nose sprays frequently 4. Saline irrigations of the nose can be very helpful if done frequently.  * 4X daily for 1 week*  * Use of a nettie pot can be helpful with this. Follow directions with this* 5. Drink plenty of fluids 6. Keep thermostat turn down low 7.For any cough or congestion  Tessalon perles as prescribed 8. For fever or aces or pains- take tylenol or ibuprofen appropriate for age and weight.  * for fevers greater than 101 orally you may alternate ibuprofen and tylenol every  3 hours.    - predniSONE (DELTASONE) 20 MG tablet; Take 2 tablets (40 mg total) by mouth daily with breakfast for 5 days. 2 po daily for 5 days  Dispense: 10 tablet; Refill: 0 - benzonatate (TESSALON PERLES) 100 MG capsule; Take 1 capsule (100 mg total) by mouth 3 (three) times daily as needed for cough.  Dispense: 20 capsule; Refill: 0     Follow Up Instructions: prn    I discussed the assessment and treatment plan with the patient. The patient was provided an opportunity to ask questions and all were answered. The patient agreed with the plan and demonstrated an understanding of the instructions.   The patient was advised to call back or seek an in-person evaluation if the symptoms worsen or if the condition fails to improve as anticipated.  The above assessment and management plan was discussed with the patient. The patient verbalized understanding of and has agreed to the management plan. Patient is aware to call the clinic if symptoms persist  or worsen. Patient is aware when to return to the clinic for a follow-up visit. Patient educated on when it is appropriate to go to the emergency department.   Time call ended:  1:22  I provided 12 minutes of  non face-to-face time during this encounter.    Mary-Margaret Hassell Done, FNP

## 2020-12-21 ENCOUNTER — Ambulatory Visit (INDEPENDENT_AMBULATORY_CARE_PROVIDER_SITE_OTHER): Payer: Medicare Other | Admitting: Family Medicine

## 2020-12-21 ENCOUNTER — Encounter: Payer: Self-pay | Admitting: Family Medicine

## 2020-12-21 ENCOUNTER — Ambulatory Visit (INDEPENDENT_AMBULATORY_CARE_PROVIDER_SITE_OTHER): Payer: Medicare Other

## 2020-12-21 ENCOUNTER — Other Ambulatory Visit: Payer: Self-pay

## 2020-12-21 VITALS — BP 137/68 | HR 50 | Temp 97.7°F | Ht 64.0 in | Wt 240.0 lb

## 2020-12-21 DIAGNOSIS — E7800 Pure hypercholesterolemia, unspecified: Secondary | ICD-10-CM | POA: Diagnosis not present

## 2020-12-21 DIAGNOSIS — Z780 Asymptomatic menopausal state: Secondary | ICD-10-CM

## 2020-12-21 DIAGNOSIS — M5137 Other intervertebral disc degeneration, lumbosacral region: Secondary | ICD-10-CM

## 2020-12-21 DIAGNOSIS — M25512 Pain in left shoulder: Secondary | ICD-10-CM

## 2020-12-21 DIAGNOSIS — Z8379 Family history of other diseases of the digestive system: Secondary | ICD-10-CM | POA: Diagnosis not present

## 2020-12-21 DIAGNOSIS — G8929 Other chronic pain: Secondary | ICD-10-CM | POA: Diagnosis not present

## 2020-12-21 DIAGNOSIS — M25561 Pain in right knee: Secondary | ICD-10-CM | POA: Diagnosis not present

## 2020-12-21 DIAGNOSIS — M25562 Pain in left knee: Secondary | ICD-10-CM

## 2020-12-21 DIAGNOSIS — F1190 Opioid use, unspecified, uncomplicated: Secondary | ICD-10-CM

## 2020-12-21 MED ORDER — OXYCODONE-ACETAMINOPHEN 7.5-325 MG PO TABS: 1.0000 | ORAL_TABLET | Freq: Two times a day (BID) | ORAL | 0 refills | Status: DC | PRN

## 2020-12-21 NOTE — Patient Instructions (Signed)
Knee Injection A knee injection is a procedure to get medicine into your knee joint to relieve the pain, swelling, and stiffness of arthritis. Your health care provider uses a needle to inject medicine, which may also help to lubricate and cushion your knee joint. You may need more than one injection. Tell a health care provider about:  Any allergies you have.  All medicines you are taking, including vitamins, herbs, eye drops, creams, and over-the-counter medicines.  Any problems you or family members have had with anesthetic medicines.  Any blood disorders you have.  Any surgeries you have had.  Any medical conditions you have.  Whether you are pregnant or may be pregnant. What are the risks? Generally, this is a safe procedure. However, problems may occur, including:  Infection.  Bleeding.  Symptoms that get worse.  Damage to the area around your knee.  Allergic reaction to any of the medicines.  Skin reactions from repeated injections. What happens before the procedure?  Ask your health care provider about: ? Changing or stopping your regular medicines. This is especially important if you are taking diabetes medicines or blood thinners. ? Taking medicines such as aspirin and ibuprofen. These medicines can thin your blood. Do not take these medicines unless your health care provider tells you to take them. ? Taking over-the-counter medicines, vitamins, herbs, and supplements.  Plan to have a responsible adult take you home from the hospital or clinic. What happens during the procedure?  You will sit or lie down in a position for your knee to be treated.  The skin over your kneecap will be cleaned with a germ-killing soap.  You will be given a medicine that numbs the area (local anesthetic). You may feel some stinging.  The medicine will be injected into your knee. The needle is carefully placed between your kneecap and your knee. The medicine is injected into the  joint space.  The needle will be removed at the end of the procedure.  A bandage (dressing) may be placed over the injection site. The procedure may vary among health care providers and hospitals.   What can I expect after the procedure?  Your blood pressure, heart rate, breathing rate, and blood oxygen level will be monitored until you leave the hospital or clinic.  You may have to move your knee through its full range of motion. This helps to get all the medicine into your joint space.  You will be watched to make sure that you do not have a reaction to the injected medicine.  You may feel more pain, swelling, and warmth than you did before the injection. This reaction may last about 1-2 days. Follow these instructions at home: Medicines  Take over-the-counter and prescription medicines only as told by your health care provider.  Ask your health care provider if the medicine prescribed to you requires you to avoid driving or using machinery.  Do not take medicines such as aspirin and ibuprofen unless your health care provider tells you to take them. Injection site care  Follow instructions from your health care provider about: ? How to take care of your puncture site. ? When and how you should change your dressing. ? When you should remove your dressing.  Check your injection area every day for signs of infection. Check for: ? More redness, swelling, or pain after 2 days. ? Fluid or blood. ? Pus or a bad smell. ? Warmth. Managing pain, stiffness, and swelling  If directed, put ice on   the injection area. To do this: ? Put ice in a plastic bag. ? Place a towel between your skin and the bag. ? Leave the ice on for 20 minutes, 2-3 times per day. ? Remove the ice if your skin turns bright red. This is very important. If you cannot feel pain, heat, or cold, you have a greater risk of damage to the area.  Do not apply heat to your knee.  Raise (elevate) the injection area  above the level of your heart while you are sitting or lying down.   General instructions  If you were given a dressing, keep it dry until your health care provider says it can be removed. Ask your health care provider when you can start showering or bathing.  Avoid strenuous activities for as long as directed by your health care provider. Ask your health care provider when you can return to your normal activities.  Keep all follow-up visits. This is important. You may need more injections. Contact a health care provider if you have:  A fever.  Warmth in your injection area.  Fluid, blood, or pus coming from your injection site.  Symptoms at your injection site that last longer than 2 days after your procedure. Get help right away if:  Your knee turns very red.  Your knee becomes very swollen.  Your knee is in severe pain. Summary  A knee injection is a procedure to get medicine into your knee joint to relieve the pain, swelling, and stiffness of arthritis.  A needle is carefully placed between your kneecap and your knee to inject medicine into the joint space.  Before the procedure, ask your health care provider about changing or stopping your regular medicines, especially if you are taking diabetes medicines or blood thinners.  Contact your health care provider if you have any problems or questions after your procedure. This information is not intended to replace advice given to you by your health care provider. Make sure you discuss any questions you have with your health care provider. Document Revised: 01/13/2020 Document Reviewed: 01/13/2020 [employer] Patient Education  2021 [employer]  

## 2020-12-21 NOTE — Progress Notes (Signed)
Subjective: CC: Follow-up chronic pain PCP: Janora Norlander, DO HGD:JMEQAST Valerie Baker is a 65 y.o. female presenting to clinic today for:  1.  Chronic pain Patient with known degenerative changes in the lumbar spine, shoulder.  She has bilateral degeneration of the knees in fact is due to have knee injections tomorrow with orthopedics but she is asking to have this done here so she does not have to drive down there.  She is brought to the office today by her husband.  She is had previous knee injections but has been over 3 months since last knee injections.  She is needing a knee replacement but has to lose significant amounts of weight before she can have this done and has not been able to do this as of yet.  She reports medial knee pain on the right.  She has had knee surgery x2 on the left.  She is able to ambulate but again has been using a cane due to exacerbation of pain which has unfortunately aggravated her shoulder.  No falls, constipation, respiratory depression, dizziness with the Percocet.  She has been needing the Percocet twice daily over the last week or so due to increased knee pain.  She still has about a bottle left at home but is here for more  2.  Hyperlipidemia Patient reports that her mother was diagnosed with Karlene Lineman again a sibling has cirrhosis as well.  She would like to get her liver enzymes checked today because she is worried about this.  No reports of abdominal pain, scleral icterus.  ROS: Per HPI  Allergies  Allergen Reactions  . Sulfonamide Derivatives Rash   Past Medical History:  Diagnosis Date  . Adenomatous colon polyp 06/2006  . Allergy    SINUS  . Anxiety   . Anxiety and depression   . Arthritis   . Asthma   . Bladder incontinence   . Blood transfusion   . Cataract   . Depression   . Fibromyalgia   . GERD (gastroesophageal reflux disease)   . Hyperlipidemia   . Hypertension   . IBS (irritable bowel syndrome)   . Kidney stones   .  Migraines   . Obesity   . Osteopenia     Current Outpatient Medications:  .  albuterol (PROVENTIL HFA;VENTOLIN HFA) 108 (90 Base) MCG/ACT inhaler, Inhale 2 puffs into the lungs every 6 (six) hours as needed for wheezing or shortness of breath., Disp: 1 Inhaler, Rfl: 0 .  baclofen (LIORESAL) 10 MG tablet, TAKE 1/2 TO 1 TABLET BY  MOUTH 3 TIMES DAILY AS  NEEDED FOR MUSCLE SPASMS, Disp: 270 tablet, Rfl: 2 .  benzonatate (TESSALON PERLES) 100 MG capsule, Take 1 capsule (100 mg total) by mouth 3 (three) times daily as needed for cough., Disp: 20 capsule, Rfl: 0 .  Calcium Carbonate-Vitamin D 600-400 MG-UNIT tablet, Take 1 tablet by mouth 2 (two) times daily., Disp: , Rfl:  .  Cholecalciferol (VITAMIN D3) 2000 UNITS TABS, Take 1 tablet by mouth daily., Disp: , Rfl:  .  citalopram (CELEXA) 20 MG tablet, TAKE 1 TABLET BY MOUTH  DAILY, Disp: 90 tablet, Rfl: 0 .  diclofenac Sodium (VOLTAREN) 1 % GEL, Apply 4 g topically 4 (four) times daily., Disp: 300 g, Rfl: 5 .  fluticasone (FLONASE) 50 MCG/ACT nasal spray, Place 2 sprays into both nostrils daily., Disp: 16 g, Rfl: 6 .  gabapentin (NEURONTIN) 400 MG capsule, TAKE 2 CAPSULES BY MOUTH 3  TIMES DAILY, Disp: 540  capsule, Rfl: 3 .  L-Lysine 1000 MG TABS, Take by mouth., Disp: , Rfl:  .  loratadine (CLARITIN) 10 MG tablet, Take 10 mg by mouth daily as needed for allergies., Disp: , Rfl:  .  meclizine (ANTIVERT) 12.5 MG tablet, Take 1 tablet (12.5 mg total) by mouth 3 (three) times daily as needed for dizziness., Disp: 30 tablet, Rfl: 0 .  meloxicam (MOBIC) 15 MG tablet, Take 1 tablet (15 mg total) by mouth daily., Disp: 90 tablet, Rfl: 3 .  Multiple Vitamin (MULTIVITAMIN) tablet, Take 1 tablet by mouth daily., Disp: , Rfl:  .  nystatin (MYCOSTATIN) 100000 UNIT/ML suspension, Take 5 mLs (500,000 Units total) by mouth 4 (four) times daily., Disp: 476 mL, Rfl: 0 .  Omega-3 Fatty Acids (FISH OIL) 1000 MG CAPS, Take by mouth., Disp: , Rfl:  .  omeprazole  (PRILOSEC) 40 MG capsule, Take 1 capsule (40 mg total) by mouth 2 (two) times daily., Disp: 180 capsule, Rfl: 3 .  oxybutynin (DITROPAN) 5 MG tablet, Take 1 tablet (5 mg total) by mouth 2 (two) times daily., Disp: 180 tablet, Rfl: 3 .  oxyCODONE-acetaminophen (PERCOCET) 7.5-325 MG tablet, Take 1 tablet by mouth every 12 (twelve) hours as needed for severe pain., Disp: 60 tablet, Rfl: 0 .  traZODone (DESYREL) 100 MG tablet, TAKE 1 TABLET BY MOUTH AT  BEDTIME, Disp: 90 tablet, Rfl: 3 .  triamcinolone cream (KENALOG) 0.1 %, Apply topically 2 (two) times daily., Disp: 453.6 g, Rfl: 5 .  oxyCODONE-acetaminophen (PERCOCET) 7.5-325 MG tablet, Take 1 tablet by mouth every 12 (twelve) hours as needed for severe pain. (Patient not taking: Reported on 12/21/2020), Disp: 60 tablet, Rfl: 0 .  oxyCODONE-acetaminophen (PERCOCET) 7.5-325 MG tablet, Take 1 tablet by mouth every 12 (twelve) hours as needed for severe pain. (Patient not taking: Reported on 12/21/2020), Disp: 60 tablet, Rfl: 0 Social History   Socioeconomic History  . Marital status: Married    Spouse name: Not on file  . Number of children: 3  . Years of education: Not on file  . Highest education level: Not on file  Occupational History  . Occupation: Disabled  Tobacco Use  . Smoking status: Former Smoker    Years: 5.00    Quit date: 11/02/2005    Years since quitting: 15.1  . Smokeless tobacco: Never Used  Substance and Sexual Activity  . Alcohol use: No  . Drug use: No  . Sexual activity: Yes  Other Topics Concern  . Not on file  Social History Narrative  . Not on file   Social Determinants of Health   Financial Resource Strain: Not on file  Food Insecurity: Not on file  Transportation Needs: Not on file  Physical Activity: Not on file  Stress: Not on file  Social Connections: Not on file  Intimate Partner Violence: Not on file   Family History  Problem Relation Age of Onset  . Heart disease Mother   . Diabetes Mother   .  Melanoma Mother        nose  . Diabetes Brother   . Cirrhosis Brother   . Hypertension Brother   . Stroke Father   . Hypertension Sister   . Hypertension Brother     Objective: Office vital signs reviewed. BP 137/68   Pulse (!) 50   Temp 97.7 F (36.5 C)   Ht 5\' 4"  (1.626 m)   Wt 240 lb (108.9 kg)   SpO2 95%   BMI 41.20 kg/m  Physical Examination:  General: Awake, alert, morbidly obese, No acute distress HEENT: Normal; sclera white Cardio: regular rate and rhythm, S1S2 heard, no murmurs appreciated Pulm: clear to auscultation bilaterally, no wheezes, rhonchi or rales; normal work of breathing on room air Extremities: warm, well perfused, No edema, cyanosis or clubbing; +2 pulses bilaterally MSK: antalgic/ wide based gait and station.  She is ambulating independently  Left knee: Postop scarring noted.  No gross soft tissue swelling, erythema or edema  Right knee: Tender along the medial joint line.  No gross effusion, soft tissue swelling, erythema or edema  JOINT INJECTION:  Patient denies allergy to antiseptics (including iodine) and anesthetics.  Patient denies h/o diabetes, frequent steroid use, use of blood thinners/ antiplatelets.  Patient was given informed consent and a signed copy has been placed in the chart. Appropriate time out was taken. Area prepped and draped in usual sterile fashion. Anatomic landmarks were identified and injection site was marked.  Ethyl chloride spray was used to numb the area and 1 cc of methylprednisolone 40 mg/ml plus  3 cc of 1% lidocaine without epinephrine was injected into the right knee using a(n) anteriomedial approach. The patient tolerated the procedure well and there were no immediate complications. Estimated blood loss is less than 5 cc.  Post procedure instructions were reviewed and handout outlining these instructions were provided to patient.  JOINT INJECTION:  Patient denies allergy to antiseptics (including iodine) and  anesthetics.  Patient  h/o diabetes, frequent steroid use, use of blood thinners/ antiplatelets.  Patient was given informed consent and a signed copy has been placed in the chart. Appropriate time out was taken. Area prepped and draped in usual sterile fashion. Anatomic landmarks were identified and injection site was marked.  Ethyl chloride spray was used to numb the area and 1 cc of methylprednisolone 40 mg/ml plus 3 cc of 1% lidocaine without epinephrine was injected into the left knee using a(n) anteriolateral approach. The patient tolerated the procedure well and there were no immediate complications. Estimated blood loss is less than 5 cc.  Post procedure instructions were reviewed and handout outlining these instructions were provided to patient.   Assessment/ Plan: 65 y.o. female   Disc degeneration, lumbosacral - Plan: oxyCODONE-acetaminophen (PERCOCET) 7.5-325 MG tablet, oxyCODONE-acetaminophen (PERCOCET) 7.5-325 MG tablet, oxyCODONE-acetaminophen (PERCOCET) 7.5-325 MG tablet  Chronic left shoulder pain - Plan: oxyCODONE-acetaminophen (PERCOCET) 7.5-325 MG tablet, oxyCODONE-acetaminophen (PERCOCET) 7.5-325 MG tablet, oxyCODONE-acetaminophen (PERCOCET) 7.5-325 MG tablet  Chronic, continuous use of opioids  Bilateral chronic knee pain - Plan: oxyCODONE-acetaminophen (PERCOCET) 7.5-325 MG tablet, oxyCODONE-acetaminophen (PERCOCET) 7.5-325 MG tablet, oxyCODONE-acetaminophen (PERCOCET) 7.5-325 MG tablet  Pure hypercholesterolemia - Plan: CMP14+EGFR, Lipid Panel, TSH  Family history of cirrhosis of liver - Plan: CMP14+EGFR, Lipid Panel, TSH, CBC  Her lumbar DDD is stable.  She is had some mild exacerbation in her left shoulder pain secondary to chronic knee pain and requiring a cane as of late.  She was actually supposed to see her orthopedist tomorrow for bilateral knee injections but asked that we complete that today.  These were performed.  There were no immediate locations.  Home care  instructions reviewed with the patient reasons for return discussed.  The national narcotic database was reviewed and there were no red flags.  I reduced her quantity to 45 since she is really not using this more than every 5 to 6 weeks.  She was amenable to this change.  We will check lipid panel, TSH and CMP.  Apparently there was  a new family history of cirrhosis of the liver and both sister and mother  No orders of the defined types were placed in this encounter.  No orders of the defined types were placed in this encounter.    Janora Norlander, DO [place] [phone]

## 2020-12-22 ENCOUNTER — Other Ambulatory Visit: Payer: Self-pay | Admitting: Family Medicine

## 2020-12-22 DIAGNOSIS — F339 Major depressive disorder, recurrent, unspecified: Secondary | ICD-10-CM

## 2020-12-22 LAB — CBC
Hematocrit: 38 % (ref 34.0–46.6)
Hemoglobin: 13.1 g/dL (ref 11.1–15.9)
MCH: 31.5 pg (ref 26.6–33.0)
MCHC: 34.5 g/dL (ref 31.5–35.7)
MCV: 91 fL (ref 79–97)
Platelets: 208 10*3/uL (ref 150–450)
RBC: 4.16 x10E6/uL (ref 3.77–5.28)
RDW: 13 % (ref 11.7–15.4)
WBC: 5.2 10*3/uL (ref 3.4–10.8)

## 2020-12-22 LAB — CMP14+EGFR
ALT: 15 IU/L (ref 0–32)
AST: 22 IU/L (ref 0–40)
Albumin/Globulin Ratio: 1.7 (ref 1.2–2.2)
Albumin: 4.3 g/dL (ref 3.8–4.8)
Alkaline Phosphatase: 113 IU/L (ref 44–121)
BUN/Creatinine Ratio: 24 (ref 12–28)
BUN: 20 mg/dL (ref 8–27)
Bilirubin Total: 0.4 mg/dL (ref 0.0–1.2)
CO2: 25 mmol/L (ref 20–29)
Calcium: 9.9 mg/dL (ref 8.7–10.3)
Chloride: 101 mmol/L (ref 96–106)
Creatinine, Ser: 0.83 mg/dL (ref 0.57–1.00)
Globulin, Total: 2.5 g/dL (ref 1.5–4.5)
Glucose: 96 mg/dL (ref 65–99)
Potassium: 4.6 mmol/L (ref 3.5–5.2)
Sodium: 140 mmol/L (ref 134–144)
Total Protein: 6.8 g/dL (ref 6.0–8.5)
eGFR: 79 mL/min/{1.73_m2} (ref >59)

## 2020-12-22 LAB — LIPID PANEL
Chol/HDL Ratio: 3.2 ratio (ref 0.0–4.4)
Cholesterol, Total: 211 mg/dL — ABNORMAL HIGH (ref 100–199)
HDL: 66 mg/dL (ref >39)
LDL Chol Calc (NIH): 126 mg/dL — ABNORMAL HIGH (ref 0–99)
Triglycerides: 105 mg/dL (ref 0–149)
VLDL Cholesterol Cal: 19 mg/dL (ref 5–40)

## 2020-12-22 LAB — TSH: TSH: 5.98 u[IU]/mL — ABNORMAL HIGH (ref 0.450–4.500)

## 2020-12-26 ENCOUNTER — Ambulatory Visit (INDEPENDENT_AMBULATORY_CARE_PROVIDER_SITE_OTHER): Payer: Medicare Other | Admitting: Nurse Practitioner

## 2020-12-26 ENCOUNTER — Other Ambulatory Visit: Payer: Self-pay

## 2020-12-26 ENCOUNTER — Encounter: Payer: Self-pay | Admitting: Nurse Practitioner

## 2020-12-26 ENCOUNTER — Ambulatory Visit (INDEPENDENT_AMBULATORY_CARE_PROVIDER_SITE_OTHER): Payer: Medicare Other

## 2020-12-26 VITALS — BP 160/90 | HR 57 | Temp 97.5°F | Ht 64.0 in | Wt 237.0 lb

## 2020-12-26 DIAGNOSIS — S92352A Displaced fracture of fifth metatarsal bone, left foot, initial encounter for closed fracture: Secondary | ICD-10-CM

## 2020-12-26 DIAGNOSIS — M79672 Pain in left foot: Secondary | ICD-10-CM | POA: Insufficient documentation

## 2020-12-26 NOTE — Patient Instructions (Signed)
Metatarsal Fracture A metatarsal fracture is a break in one of the five bones that connect the toes to the rest of the foot. This may also be called a forefoot fracture. A metatarsal fracture may be:  A crack in the surface of the bone (stress fracture). This often occurs in athletes.  A break all the way through the bone (complete fracture). The bone that connects to the little toe (fifth metatarsal) is most commonly fractured. Ballet dancers often fracture this bone. What are the causes? A metatarsal fracture may be caused by:  Sudden twisting of the foot.  Falling onto the foot.  Something heavy falling onto the foot.  Overuse or repetitive exercise. What increases the risk? This condition is more likely to develop in people who:  Play contact sports.  Do ballet.  Have a condition that causes the bones to become thin and brittle (osteoporosis).  Have a low calcium level. What are the signs or symptoms? Symptoms of this condition include:  Pain that gets worse when walking or standing.  Pain when pressing on the foot or moving the toes.  Swelling.  Bruising on the top or bottom of the foot. How is this diagnosed? This condition may be diagnosed based on:  Your symptoms.  Any recent foot injuries you have had.  A physical exam.  An X-ray of your foot. If you have a stress fracture, it may not show up on an X-ray, and you may need other imaging tests, such as: ? A bone scan. ? CT scan. ? MRI. How is this treated? Treatment depends on how severe your fracture is and how the pieces of the broken bone line up with each other (alignment). Treatment may involve:  Wearing a cast, splint, or supportive boot on your foot.  Using crutches, and not putting any weight on your foot.  Having surgery to align broken bones (open reduction and internal fixation, ORIF).  Physical therapy.  Follow-up visits and X-rays to make sure you are healing. Follow these instructions  at home: If you have a splint or a supportive boot:  Wear the splint or boot as told by your health care provider. Remove it only as told by your health care provider.  Loosen the splint or boot if your toes tingle, become numb, or turn cold and blue.  Keep the splint or boot clean.  If your splint or boot is not waterproof: ? Do not let it get wet. ? Cover it with a watertight covering when you take a bath or a shower. If you have a cast:  Do not stick anything inside the cast to scratch your skin. Doing that increases your risk for infection.  Check the skin around the cast every day. Tell your health care provider about any concerns.  You may put lotion on dry skin around the edges of the cast. Do not put lotion on the skin underneath the cast.  Keep the cast clean.  If the cast is not waterproof: ? Do not let it get wet. ? Cover it with a watertight covering when you take a bath or a shower. Activity  Do not use your affected leg to support your body weight until your health care provider says that you can. Use crutches as directed.  Ask your health care provider what activities are safe for you during recovery, and ask what activities you need to avoid.  Do physical therapy exercises as directed. Driving  Do not drive or use heavy machinery   while taking pain medicine.  Do not drive while wearing a cast, splint, or boot on a foot that you use for driving. Managing pain, stiffness, and swelling  If directed, put ice on painful areas: ? Put ice in a plastic bag. ? Place a towel between your skin and the bag.  If you have a removable splint or boot, remove it as told by your health care provider.  If you have a cast, place a towel between your cast and the bag. ? Leave the ice on for 20 minutes, 2-3 times a day.  Move your toes often to avoid stiffness and to lessen swelling.  Raise (elevate) your lower leg above the level of your heart while you are sitting or  lying down.   General instructions  Do not put pressure on any part of the cast or splint until it is fully hardened. This may take several hours.  Take over-the-counter and prescription medicines only as told by your health care provider.  Do not use any products that contain nicotine or tobacco, such as cigarettes and e-cigarettes. These can delay bone healing. If you need help quitting, ask your health care provider.  Do not take baths, swim, or use a hot tub until your health care provider approves. Ask your health care provider if you may take showers.  Keep all follow-up visits as told by your health care provider. This is important. Contact a health care provider if you have:  Pain that gets worse or does not get better with medicine.  A fever.  A bad smell coming from your cast or splint. Get help right away if you have:  Any of the following in your toes or your foot, even after loosening your splint (if applicable): ? Numbness. ? Tingling. ? Coldness. ? Blue skin.  Redness or swelling that gets worse.  Pain that suddenly becomes severe. Summary  A metatarsal fracture is a break in one of the five bones that connect the toes to the rest of the foot.  Treatment depends on how severe your fracture is and how the pieces of the broken bone line up with each other (alignment). This may include wearing a cast, splint, or supportive boot, or using crutches. Sometimes surgery is needed to align the bones.  Ice and elevate your foot to help lessen the pain and swelling.  Make sure you know what symptoms should cause you to get help right away. This information is not intended to replace advice given to you by your health care provider. Make sure you discuss any questions you have with your health care provider. Document Revised: 11/20/2018 Document Reviewed: 08/26/2017 Elsevier Patient Education  2021 [employer]  

## 2020-12-26 NOTE — Progress Notes (Signed)
   Subjective:    Patient ID: Valerie Baker, female    DOB: 1956/04/16, 65 y.o.   MRN: [id number]   Chief Complaint: Foot Pain (Patient states she feel off the toilet yesterday and now has left foot pain. )   HPI\ Patient was seatiing on toilet last night and fell asleep and fell. Off injurying her foot. Painful to walk on.  Review of Systems  Constitutional: Negative for diaphoresis.  Eyes: Negative for pain.  Respiratory: Negative for shortness of breath.   Cardiovascular: Negative for chest pain, palpitations and leg swelling.  Gastrointestinal: Negative for abdominal pain.  Endocrine: Negative for polydipsia.  Musculoskeletal: Positive for arthralgias (left foot).  Skin: Negative for rash.  Neurological: Negative for dizziness, weakness and headaches.  Hematological: Does not bruise/bleed easily.  All other systems reviewed and are negative.      Objective:   Physical Exam Vitals and nursing note reviewed.  Constitutional:      Appearance: Normal appearance.  Cardiovascular:     Rate and Rhythm: Normal rate.     Heart sounds: Normal heart sounds.  Pulmonary:     Breath sounds: Normal breath sounds.  Musculoskeletal:     Comments: Left lateral foot contusion with pain on mild palpation along 5th metatarsal.  Skin:    General: Skin is warm.  Neurological:     General: No focal deficit present.     Mental Status: She is alert and oriented to person, place, and time.    BP (!) 160/90   Pulse (!) 57   Temp (!) 97.5 F (36.4 C) (Temporal)   Ht 5\' 4"  (1.626 m)   Wt 237 lb (107.5 kg)   SpO2 92%   BMI 40.68 kg/m    Left foot xray- mildly displaced fracture of distal end of left t5th metatarsal.- Preliminary reading by Ronnald Collum, FNP  [facility]     Assessment & Plan:  Valerie Baker in today with chief complaint of Foot Pain (Patient states she feel off the toilet yesterday and now has left foot pain. )   1. Left foot pain - DG Foot Complete Left;  Future  2. Displaced fracture of fifth metatarsal bone, left foot, initial encounter for closed fracture Fracture shoe Stat referral to ortho - Ambulatory referral to Orthopedic Surgery    The above assessment and management plan was discussed with the patient. The patient verbalized understanding of and has agreed to the management plan. Patient is aware to call the clinic if symptoms persist or worsen. Patient is aware when to return to the clinic for a follow-up visit. Patient educated on when it is appropriate to go to the emergency department.   Mary-Margaret Hassell Done, FNP

## 2020-12-27 DIAGNOSIS — M85832 Other specified disorders of bone density and structure, left forearm: Secondary | ICD-10-CM | POA: Diagnosis not present

## 2020-12-27 DIAGNOSIS — M85852 Other specified disorders of bone density and structure, left thigh: Secondary | ICD-10-CM | POA: Diagnosis not present

## 2020-12-27 DIAGNOSIS — Z780 Asymptomatic menopausal state: Secondary | ICD-10-CM | POA: Diagnosis not present

## 2020-12-29 DIAGNOSIS — S92355A Nondisplaced fracture of fifth metatarsal bone, left foot, initial encounter for closed fracture: Secondary | ICD-10-CM | POA: Diagnosis not present

## 2020-12-29 DIAGNOSIS — S92353A Displaced fracture of fifth metatarsal bone, unspecified foot, initial encounter for closed fracture: Secondary | ICD-10-CM | POA: Insufficient documentation

## 2021-01-25 ENCOUNTER — Encounter: Payer: Medicare Other | Admitting: Gastroenterology

## 2021-01-30 DIAGNOSIS — M79672 Pain in left foot: Secondary | ICD-10-CM | POA: Diagnosis not present

## 2021-01-30 DIAGNOSIS — S92351D Displaced fracture of fifth metatarsal bone, right foot, subsequent encounter for fracture with routine healing: Secondary | ICD-10-CM | POA: Diagnosis not present

## 2021-02-02 ENCOUNTER — Telehealth: Payer: Self-pay | Admitting: Family Medicine

## 2021-02-02 NOTE — Telephone Encounter (Signed)
Pt's son has MRSA and she is wanting to know the chance of someone else getting it if they were around him.

## 2021-02-23 DIAGNOSIS — M47812 Spondylosis without myelopathy or radiculopathy, cervical region: Secondary | ICD-10-CM | POA: Diagnosis not present

## 2021-02-23 DIAGNOSIS — M4320 Fusion of spine, site unspecified: Secondary | ICD-10-CM | POA: Diagnosis not present

## 2021-02-23 DIAGNOSIS — T84296A Other mechanical complication of internal fixation device of vertebrae, initial encounter: Secondary | ICD-10-CM | POA: Diagnosis not present

## 2021-03-03 ENCOUNTER — Ambulatory Visit (AMBULATORY_SURGERY_CENTER): Payer: Medicare Other | Admitting: *Deleted

## 2021-03-03 ENCOUNTER — Other Ambulatory Visit: Payer: Self-pay

## 2021-03-03 VITALS — Ht 64.0 in | Wt 235.0 lb

## 2021-03-03 DIAGNOSIS — Z86010 Personal history of colonic polyps: Secondary | ICD-10-CM

## 2021-03-03 MED ORDER — PLENVU 140 G PO SOLR: 1.0000 | Freq: Once | ORAL | 0 refills | Status: AC

## 2021-03-03 NOTE — Progress Notes (Signed)
Patient's pre-visit was done today over the phone with the patient due to COVID-19 pandemic. Name,DOB and address verified. Insurance verified. Patient denies any allergies to Eggs and Soy. Patient denies any problems with anesthesia/sedation. Patient denies taking diet pills or blood thinners. No home Oxygen. Packet of Prep instructions mailed to patient including a copy of a consent form-pt is aware. Patient understands to call us back with any questions or concerns. Patient is aware of our care-partner policy and 0000000 safety protocol.   Plenvu coupon=mailed to pt. And attached to RX-pt aware of the cost-she did not wan to do the Golytely.   The patient is COVID-19 vaccinated, per patient.

## 2021-03-07 ENCOUNTER — Telehealth: Payer: Self-pay | Admitting: Gastroenterology

## 2021-03-07 DIAGNOSIS — Z86010 Personal history of colonic polyps: Secondary | ICD-10-CM

## 2021-03-07 MED ORDER — PEG 3350-KCL-NA BICARB-NACL 420 G PO SOLR: 4000.0000 mL | Freq: Once | ORAL | 0 refills | Status: AC

## 2021-03-07 NOTE — Telephone Encounter (Signed)
I spoke with pharmacy and Golytely is available- he states if will either be covered by insurance or very inexpensive.   I spoke with pt and she agreeable to taking Golytely.  New instructions mailed to her.

## 2021-03-07 NOTE — Telephone Encounter (Signed)
Pt states that copay for Plenvu is over $60. She would like something that is fully covered by her insurance.

## 2021-03-09 ENCOUNTER — Telehealth: Payer: Self-pay | Admitting: Family Medicine

## 2021-03-09 NOTE — Telephone Encounter (Signed)
Please review

## 2021-03-09 NOTE — Telephone Encounter (Signed)
Patient was assessed today by "[facility]" and found to have the following:  A1c - 5.8 PAD test - showed moderate peripheral artery disease  Patient was told to follow up with her PCP.

## 2021-03-10 ENCOUNTER — Other Ambulatory Visit: Payer: Self-pay | Admitting: Family Medicine

## 2021-03-10 DIAGNOSIS — R6889 Other general symptoms and signs: Secondary | ICD-10-CM

## 2021-03-10 NOTE — Telephone Encounter (Signed)
Patient is in agreement to see Vascular specialist and she would like to go to [place].

## 2021-03-10 NOTE — Telephone Encounter (Signed)
Would like her to see a vascular specialist if she is willing.  They will likely repeat an ABI test.  Does she prefer [place] or [place]?

## 2021-03-14 DIAGNOSIS — M7740 Metatarsalgia, unspecified foot: Secondary | ICD-10-CM | POA: Diagnosis not present

## 2021-03-14 DIAGNOSIS — M2041 Other hammer toe(s) (acquired), right foot: Secondary | ICD-10-CM | POA: Diagnosis not present

## 2021-03-17 ENCOUNTER — Ambulatory Visit (AMBULATORY_SURGERY_CENTER): Payer: Medicare Other | Admitting: Gastroenterology

## 2021-03-17 ENCOUNTER — Other Ambulatory Visit: Payer: Self-pay

## 2021-03-17 ENCOUNTER — Encounter: Payer: Self-pay | Admitting: Gastroenterology

## 2021-03-17 VITALS — BP 157/69 | HR 49 | Temp 97.2°F | Resp 12 | Ht 64.0 in | Wt 235.0 lb

## 2021-03-17 DIAGNOSIS — Z86010 Personal history of colonic polyps: Secondary | ICD-10-CM

## 2021-03-17 DIAGNOSIS — D125 Benign neoplasm of sigmoid colon: Secondary | ICD-10-CM | POA: Diagnosis not present

## 2021-03-17 DIAGNOSIS — Z1211 Encounter for screening for malignant neoplasm of colon: Secondary | ICD-10-CM | POA: Diagnosis not present

## 2021-03-17 MED ORDER — SODIUM CHLORIDE 0.9 % IV SOLN: 500.0000 mL | Freq: Once | INTRAVENOUS | Status: DC

## 2021-03-17 NOTE — Progress Notes (Signed)
Pt's states no medical or surgical changes since previsit or office visit.  VS CW  

## 2021-03-17 NOTE — Op Note (Signed)
[facility] Patient Name: Valerie Baker Procedure Date: 03/17/2021 10:33 AM MRN: [id number] Endoscopist: Ladene Artist , MD Age: 65 Referring MD:  Date of Birth: Jun 25, 1956 Gender: Female Account #: [id number] Procedure:                Colonoscopy Indications:              Surveillance: Personal history of adenomatous                            polyps on last colonoscopy > 5 years ago Medicines:                Monitored Anesthesia Care Procedure:                Pre-Anesthesia Assessment:                           - Prior to the procedure, a History and Physical                            was performed, and patient medications and                            allergies were reviewed. The patient's tolerance of                            previous anesthesia was also reviewed. The risks                            and benefits of the procedure and the sedation                            options and risks were discussed with the patient.                            All questions were answered, and informed consent                            was obtained. Prior Anticoagulants: The patient has                            taken no previous anticoagulant or antiplatelet                            agents. ASA Grade Assessment: III - A patient with                            severe systemic disease. After reviewing the risks                            and benefits, the patient was deemed in                            satisfactory condition to undergo the procedure.  After obtaining informed consent, the colonoscope                            was passed under direct vision. Throughout the                            procedure, the patient's blood pressure, pulse, and                            oxygen saturations were monitored continuously. The                            Olympus CF-HQ190L ([id number]) Colonoscope was                            introduced through  the anus and advanced to the the                            cecum, identified by appendiceal orifice and                            ileocecal valve. The ileocecal valve, appendiceal                            orifice, and rectum were photographed. The quality                            of the bowel preparation was good. The colonoscopy                            was performed without difficulty. The patient                            tolerated the procedure well. Scope In: 11:30:50 AM Scope Out: 11:47:41 AM Scope Withdrawal Time: 0 hours 13 minutes 59 seconds  Total Procedure Duration: 0 hours 16 minutes 51 seconds  Findings:                 The perianal and digital rectal examinations were                            normal.                           Two sessile polyps were found in the sigmoid colon.                            The polyps were 7 to 8 mm in size. These polyps                            were removed with a cold snare. Resection and                            retrieval were complete.  Multiple small-mouthed diverticula were found in                            the left colon. There was no evidence of                            diverticular bleeding.                           Internal hemorrhoids were found during                            retroflexion. The hemorrhoids were small and Grade                            I (internal hemorrhoids that do not prolapse).                           The exam was otherwise without abnormality on                            direct and retroflexion views. Complications:            No immediate complications. Estimated blood loss:                            None. Estimated Blood Loss:     Estimated blood loss: none. Impression:               - Two 7 to 8 mm polyps in the sigmoid colon,                            removed with a cold snare. Resected and retrieved.                           - Mild diverticulosis in the  left colon.                           - Internal hemorrhoids.                           - The examination was otherwise normal on direct                            and retroflexion views. Recommendation:           - Repeat colonoscopy after studies are complete for                            surveillance based on pathology results.                           - Patient has a contact number available for                            emergencies. The signs and symptoms of potential  delayed complications were discussed with the                            patient. Return to normal activities tomorrow.                            Written discharge instructions were provided to the                            patient.                           - High fiber diet.                           - Continue present medications.                           - Await pathology results. Ladene Artist, MD 03/17/2021 11:52:38 AM This report has been signed electronically.

## 2021-03-17 NOTE — Progress Notes (Signed)
Called to room to assist during endoscopic procedure.  Patient ID and intended procedure confirmed with present staff. Received instructions for my participation in the procedure from the performing physician.  

## 2021-03-17 NOTE — Progress Notes (Signed)
To PACU, VSS. Report to [email or web address].tb 

## 2021-03-17 NOTE — Patient Instructions (Addendum)
Impression/Recommendations:  Polyp, diverticulosis, high fiber diet,and hemorrhoid handouts given to patient.  Continue present medications. Await pathology results.  Repeat colonoscopy for surveillance.  Date to be determined after pathology results reviewed.  YOU HAD AN ENDOSCOPIC PROCEDURE TODAY AT [facility]:   Refer to the procedure report that was given to you for any specific questions about what was found during the examination.  If the procedure report does not answer your questions, please call your gastroenterologist to clarify.  If you requested that your care partner not be given the details of your procedure findings, then the procedure report has been included in a sealed envelope for you to review at your convenience later.  YOU SHOULD EXPECT: Some feelings of bloating in the abdomen. Passage of more gas than usual.  Walking can help get rid of the air that was put into your GI tract during the procedure and reduce the bloating. If you had a lower endoscopy (such as a colonoscopy or flexible sigmoidoscopy) you may notice spotting of blood in your stool or on the toilet paper. If you underwent a bowel prep for your procedure, you may not have a normal bowel movement for a few days.  Please Note:  You might notice some irritation and congestion in your nose or some drainage.  This is from the oxygen used during your procedure.  There is no need for concern and it should clear up in a day or so.  SYMPTOMS TO REPORT IMMEDIATELY:  Following lower endoscopy (colonoscopy or flexible sigmoidoscopy):  Excessive amounts of blood in the stool  Significant tenderness or worsening of abdominal pains  Swelling of the abdomen that is new, acute  Fever of 100F or higher  For urgent or emergent issues, a gastroenterologist can be reached at any hour by calling [phone]. Do not use MyChart messaging for urgent concerns.    DIET:  We do recommend a small meal at  first, but then you may proceed to your regular diet.  Drink plenty of fluids but you should avoid alcoholic beverages for 24 hours.  ACTIVITY:  You should plan to take it easy for the rest of today and you should NOT DRIVE or use heavy machinery until tomorrow (because of the sedation medicines used during the test).    FOLLOW UP: Our staff will call the number listed on your records 48-72 hours following your procedure to check on you and address any questions or concerns that you may have regarding the information given to you following your procedure. If we do not reach you, we will leave a message.  We will attempt to reach you two times.  During this call, we will ask if you have developed any symptoms of COVID 19. If you develop any symptoms (ie: fever, flu-like symptoms, shortness of breath, cough etc.) before then, please call [phone].  If you test positive for Covid 19 in the 2 weeks post procedure, please call and report this information to [place].    If any biopsies were taken you will be contacted by phone or by letter within the next 1-3 weeks.  Please call us at [phone] if you have not heard about the biopsies in 3 weeks.    SIGNATURES/CONFIDENTIALITY: You and/or your care partner have signed paperwork which will be entered into your electronic medical record.  These signatures attest to the fact that that the information above on your After Visit Summary has been reviewed and is understood.  Full  responsibility of the confidentiality of this discharge information lies with you and/or your care-partner.  

## 2021-03-20 ENCOUNTER — Other Ambulatory Visit: Payer: Self-pay | Admitting: *Deleted

## 2021-03-20 DIAGNOSIS — M25569 Pain in unspecified knee: Secondary | ICD-10-CM

## 2021-03-21 ENCOUNTER — Telehealth: Payer: Self-pay

## 2021-03-21 NOTE — Telephone Encounter (Signed)
  Follow up Call-  Call back number 03/17/2021  Post procedure Call Back phone  # [phone]  Permission to leave phone message Yes  Some recent data might be hidden     Patient questions:  Do you have a fever, pain , or abdominal swelling? No. Pain Score  0 *  Have you tolerated food without any problems? Yes.    Have you been able to return to your normal activities? Yes.    Do you have any questions about your discharge instructions: Diet   No. Medications  No. Follow up visit  No.  Do you have questions or concerns about your Care? No.  Actions: * If pain score is 4 or above: No action needed, pain <4.  Have you developed a fever since your procedure? no  2.   Have you had an respiratory symptoms (SOB or cough) since your procedure? no  3.   Have you tested positive for COVID 19 since your procedure no  4.   Have you had any family members/close contacts diagnosed with the COVID 19 since your procedure?  no   If yes to any of these questions please route to Joylene John, RN and Joella Prince, RN

## 2021-03-29 ENCOUNTER — Inpatient Hospital Stay (HOSPITAL_COMMUNITY): Admission: RE | Admit: 2021-03-29 | Payer: Medicare Other | Source: Ambulatory Visit

## 2021-03-31 ENCOUNTER — Encounter: Payer: Self-pay | Admitting: Gastroenterology

## 2021-04-04 DIAGNOSIS — M2041 Other hammer toe(s) (acquired), right foot: Secondary | ICD-10-CM | POA: Diagnosis not present

## 2021-04-04 DIAGNOSIS — M79674 Pain in right toe(s): Secondary | ICD-10-CM | POA: Diagnosis not present

## 2021-04-05 DIAGNOSIS — M960 Pseudarthrosis after fusion or arthrodesis: Secondary | ICD-10-CM | POA: Diagnosis not present

## 2021-04-05 DIAGNOSIS — T84296A Other mechanical complication of internal fixation device of vertebrae, initial encounter: Secondary | ICD-10-CM | POA: Diagnosis not present

## 2021-04-05 DIAGNOSIS — M7918 Myalgia, other site: Secondary | ICD-10-CM | POA: Diagnosis not present

## 2021-04-05 DIAGNOSIS — M4320 Fusion of spine, site unspecified: Secondary | ICD-10-CM | POA: Diagnosis not present

## 2021-04-05 DIAGNOSIS — M47812 Spondylosis without myelopathy or radiculopathy, cervical region: Secondary | ICD-10-CM | POA: Diagnosis not present

## 2021-04-05 DIAGNOSIS — M545 Low back pain, unspecified: Secondary | ICD-10-CM | POA: Diagnosis not present

## 2021-04-14 ENCOUNTER — Other Ambulatory Visit: Payer: Self-pay

## 2021-04-14 ENCOUNTER — Ambulatory Visit: Payer: Medicare Other | Admitting: Physician Assistant

## 2021-04-14 ENCOUNTER — Ambulatory Visit (HOSPITAL_COMMUNITY)
Admission: RE | Admit: 2021-04-14 | Discharge: 2021-04-14 | Disposition: A | Discharge status: Home / Self Care | Payer: Medicare Other | Source: Ambulatory Visit | Attending: Vascular Surgery | Admitting: Vascular Surgery

## 2021-04-14 VITALS — BP 133/69 | HR 50 | Temp 97.2°F | Resp 20 | Ht 64.0 in | Wt 237.0 lb

## 2021-04-14 DIAGNOSIS — R6889 Other general symptoms and signs: Secondary | ICD-10-CM | POA: Diagnosis not present

## 2021-04-14 DIAGNOSIS — R0989 Other specified symptoms and signs involving the circulatory and respiratory systems: Secondary | ICD-10-CM

## 2021-04-14 DIAGNOSIS — M25569 Pain in unspecified knee: Secondary | ICD-10-CM | POA: Diagnosis not present

## 2021-04-14 NOTE — Progress Notes (Signed)
Office Note     CC:  follow up Requesting Provider:  Janora Norlander, DO  HPI: Valerie Baker is a 65 y.o. (12/26/1955) female who presents with abnormal home screening vascular exam. She is accompanied by her husband. She said a device was placed on her fingers and the test was abnormal. She denies claudication or rest pain. She has no prior history of CV disease, hypertension or DM. She quit smoking in 2007. She is intolerant of statins. Her family history is significant for stroke (father) and carotid artery stenosis (mother). She has history of spinal fusion and is to undergo further surgery. She also needs to have her right knee replaced.   Past Medical History:  Diagnosis Date   Adenomatous colon polyp 06/13/2006   Allergy    SINUS   Anxiety    Anxiety and depression    Arthritis    Asthma    Bladder incontinence    Blood transfusion    Cataract    Depression    Fibromyalgia    GERD (gastroesophageal reflux disease)    Hyperlipidemia    Hypertension    IBS (irritable bowel syndrome)    Kidney stones    Migraines    Obesity    Osteopenia    Post-operative nausea and vomiting     Past Surgical History:  Procedure Laterality Date   ABDOMINAL HYSTERECTOMY     APPENDECTOMY     BACK SURGERY  2000   BREAST BIOPSY Bilateral 11/26/2017   FIBROCYSTIC CHANGES WITH CALCIFICATIONS    COLONOSCOPY  06/12/2011   [facility] Plan, 03/17/21 MS   DEBRIDEMENT TENNIS ELBOW     ELBOW SURGERY  2003   EYE SURGERY     FOOT SURGERY  1994   right   LUMBAR FUSION  2007   LUMBAR FUSION  2002,2003,2007,2008   NASAL SINUS SURGERY  123456   NISSEN FUNDOPLICATION  [id number]   ROTATOR CUFF REPAIR  [id number]   SPINAL CORD STIMULATOR IMPLANT  2005   TOTAL KNEE ARTHROPLASTY  2008,2009    Social History   Socioeconomic History   Marital status: Married    Spouse name: Not on file   Number of children: 3   Years of education: Not on file   Highest education level: Not on file  Occupational  History   Occupation: Disabled  Tobacco Use   Smoking status: Former    Years: 5.00    Types: Cigarettes    Quit date: 11/02/2005    Years since quitting: 15.4   Smokeless tobacco: Never  Vaping Use   Vaping Use: Never used  Substance and Sexual Activity   Alcohol use: No   Drug use: No   Sexual activity: Yes  Other Topics Concern   Not on file  Social History Narrative   Not on file   Social Determinants of Health   Financial Resource Strain: Not on file  Food Insecurity: Not on file  Transportation Needs: Not on file  Physical Activity: Not on file  Stress: Not on file  Social Connections: Not on file  Intimate Partner Violence: Not on file   Family History  Problem Relation Age of Onset   Heart disease Mother    Diabetes Mother    Melanoma Mother        nose   Stroke Father    Colon polyps Sister    Hypertension Sister    Diabetes Brother    Cirrhosis Brother    Hypertension Brother  Colon polyps Brother    Hypertension Brother    Stomach cancer Other    Rectal cancer Other    Esophageal cancer Other    Colon cancer Other     Current Outpatient Medications  Medication Sig Dispense Refill   albuterol (PROVENTIL HFA;VENTOLIN HFA) 108 (90 Base) MCG/ACT inhaler Inhale 2 puffs into the lungs every 6 (six) hours as needed for wheezing or shortness of breath. 1 Inhaler 0   Ascorbic Acid (VITAMIN C PO) Take by mouth.     baclofen (LIORESAL) 10 MG tablet TAKE 1/2 TO 1 TABLET BY  MOUTH 3 TIMES DAILY AS  NEEDED FOR MUSCLE SPASMS 270 tablet 2   Calcium Carbonate-Vitamin D 600-400 MG-UNIT tablet Take 1 tablet by mouth 2 (two) times daily.     Cholecalciferol (VITAMIN D3) 2000 UNITS TABS Take 1 tablet by mouth daily.     citalopram (CELEXA) 20 MG tablet TAKE 1 TABLET BY MOUTH  DAILY 90 tablet 3   diclofenac Sodium (VOLTAREN) 1 % GEL Apply 4 g topically 4 (four) times daily. 300 g 5   fluticasone (FLONASE) 50 MCG/ACT nasal spray Place 2 sprays into both nostrils daily.  16 g 6   gabapentin (NEURONTIN) 400 MG capsule TAKE 2 CAPSULES BY MOUTH 3  TIMES DAILY 540 capsule 3   L-Lysine 1000 MG TABS Take by mouth.     loratadine (CLARITIN) 10 MG tablet Take 10 mg by mouth daily as needed for allergies.     meloxicam (MOBIC) 15 MG tablet Take 1 tablet (15 mg total) by mouth daily. 90 tablet 3   Multiple Vitamin (MULTIVITAMIN) tablet Take 1 tablet by mouth daily.     Omega-3 Fatty Acids (FISH OIL) 1000 MG CAPS Take by mouth.     omeprazole (PRILOSEC) 40 MG capsule Take 1 capsule (40 mg total) by mouth 2 (two) times daily. 180 capsule 3   oxybutynin (DITROPAN) 5 MG tablet Take 1 tablet (5 mg total) by mouth 2 (two) times daily. 180 tablet 3   oxyCODONE-acetaminophen (PERCOCET) 7.5-325 MG tablet Take 1 tablet by mouth every 12 (twelve) hours as needed for severe pain. 45 tablet 0   traZODone (DESYREL) 100 MG tablet TAKE 1 TABLET BY MOUTH AT  BEDTIME 90 tablet 3   VITAMIN E PO Take by mouth.     triamcinolone cream (KENALOG) 0.1 % Apply topically 2 (two) times daily. (Patient not taking: No sig reported) 453.6 g 5   No current facility-administered medications for this visit.    Allergies  Allergen Reactions   Sulfonamide Derivatives Rash     REVIEW OF SYSTEMS:   '[X]'$  denotes positive finding, '[ ]'$  denotes negative finding Cardiac  Comments:  Chest pain or chest pressure:    Shortness of breath upon exertion:    Short of breath when lying flat:    Irregular heart rhythm:        Vascular    Pain in calf, thigh, or hip brought on by ambulation:    Pain in feet at night that wakes you up from your sleep:     Blood clot in your veins:    Leg swelling:         Pulmonary    Oxygen at home:    Productive cough:     Wheezing:         Neurologic    Sudden weakness in arms or legs:     Sudden numbness in arms or legs:  x Numbness in legs  Sudden onset of difficulty speaking or slurred speech:    Temporary loss of vision in one eye:     Problems with  dizziness:         Gastrointestinal    Blood in stool:     Vomited blood:         Genitourinary    Burning when urinating:     Blood in urine:        Psychiatric    Major depression:         Hematologic    Bleeding problems:    Problems with blood clotting too easily:        Skin    Rashes or ulcers:        Constitutional    Fever or chills:      PHYSICAL EXAMINATION:  Vitals:   04/14/21 1117  BP: 133/69  Pulse: (!) 50  Resp: 20  Temp: (!) 97.2 F (36.2 C)  SpO2: 97%  Weight: 237 lb (107.5 kg)  Height: '5\' 4"'$  (1.626 m)    General:  WDWN in NAD; vital signs documented above Gait: Not observed HENT: WNL, normocephalic Pulmonary: normal non-labored breathing , without Rales, rhonchi,  wheezing Cardiac: regular HR, without  Murmurs without carotid bruit Abdomen: soft, NT, no masses Skin: without rashes Vascular Exam/Pulses: 2+ bilateral DP, PT, radial and brachial artery pulses. Weakly palp pop pulses Extremities: without ischemic changes, without Gangrene , without cellulitis; without open wounds;  Musculoskeletal: no muscle wasting or atrophy  Neurologic: A&O X 3;  No focal weakness or paresthesias are detected Psychiatric:  The pt has Normal affect.   Non-Invasive Vascular Imaging:   04/14/2021  ABI/TBIToday's ABIToday's TBIPrevious ABIPrevious TBI  +-------+-----------+-----------+------------+------------+  Right  1.13       0.84                                 +-------+-----------+-----------+------------+------------+  Left   1.13       0.75                                 +-------+-----------+-----------+------------+------------+  Left TP = 133 Right TP = 149 Triphasic waveforms bilaterally  Summary:  Right: Resting right ankle-brachial index is within normal range. No  evidence of significant right lower extremity arterial disease. The right  toe-brachial index is normal.   Left: Resting left ankle-brachial index is within  normal range. No  evidence of significant left lower extremity arterial disease. The left  toe-brachial index is normal.    ASSESSMENT/PLAN:: 65 y.o. female here for vascular evaluation after abnormal home test. Normal ABIs and physical exam. Discussed signs and symptoms of stroke, mini-stroke and claudication. Happy to re-evaluate should she develop symptoms.  Barbie Banner, PA-C [facility] [phone]  Clinic MD:   Dr. Scot Dock on-call

## 2021-04-19 ENCOUNTER — Other Ambulatory Visit: Payer: Self-pay

## 2021-04-19 ENCOUNTER — Encounter: Payer: Self-pay | Admitting: Family Medicine

## 2021-04-19 ENCOUNTER — Ambulatory Visit (INDEPENDENT_AMBULATORY_CARE_PROVIDER_SITE_OTHER): Payer: Medicare Other | Admitting: Family Medicine

## 2021-04-19 VITALS — BP 139/81 | HR 58 | Temp 97.4°F | Ht 64.0 in | Wt 232.0 lb

## 2021-04-19 DIAGNOSIS — Z01818 Encounter for other preprocedural examination: Secondary | ICD-10-CM | POA: Diagnosis not present

## 2021-04-19 DIAGNOSIS — F1190 Opioid use, unspecified, uncomplicated: Secondary | ICD-10-CM

## 2021-04-19 DIAGNOSIS — Z79899 Other long term (current) drug therapy: Secondary | ICD-10-CM | POA: Diagnosis not present

## 2021-04-19 DIAGNOSIS — M5137 Other intervertebral disc degeneration, lumbosacral region: Secondary | ICD-10-CM

## 2021-04-19 DIAGNOSIS — E7800 Pure hypercholesterolemia, unspecified: Secondary | ICD-10-CM | POA: Diagnosis not present

## 2021-04-19 DIAGNOSIS — M25512 Pain in left shoulder: Secondary | ICD-10-CM | POA: Diagnosis not present

## 2021-04-19 DIAGNOSIS — M51379 Other intervertebral disc degeneration, lumbosacral region without mention of lumbar back pain or lower extremity pain: Secondary | ICD-10-CM

## 2021-04-19 DIAGNOSIS — M25562 Pain in left knee: Secondary | ICD-10-CM | POA: Diagnosis not present

## 2021-04-19 DIAGNOSIS — M25561 Pain in right knee: Secondary | ICD-10-CM

## 2021-04-19 DIAGNOSIS — G8929 Other chronic pain: Secondary | ICD-10-CM

## 2021-04-19 LAB — URINALYSIS
Bilirubin, UA: NEGATIVE
Glucose, UA: NEGATIVE
Ketones, UA: NEGATIVE
Leukocytes,UA: NEGATIVE
Nitrite, UA: NEGATIVE
Protein,UA: NEGATIVE
RBC, UA: NEGATIVE
Specific Gravity, UA: 1.02 (ref 1.005–1.030)
Urobilinogen, Ur: 0.2 mg/dL (ref 0.2–1.0)
pH, UA: 7 (ref 5.0–7.5)

## 2021-04-19 MED ORDER — OXYCODONE-ACETAMINOPHEN 7.5-325 MG PO TABS: 1.0000 | ORAL_TABLET | Freq: Three times a day (TID) | ORAL | 0 refills | Status: DC | PRN

## 2021-04-19 NOTE — Progress Notes (Signed)
Pt is a 65 y.o. female who is here for preoperative clearance for back surgery.  She denies any chest pain, shortness of breath, dyspnea on exertion, dizziness, previous issues with operative sedation, intubation.  She does have issues with postop nausea and has required pretreatment with antiemetics.  1) High Risk Cardiac Conditions  1) Recent MI - No.  2) Decompensated Heart Failure - No.  3) Unstable angina - No.  4) Symptomatic arrythmia - No.  5) Sx Valvular Disease - No.  2) Intermediate Risk Factors - DM, CKD, CVA, CHF, CAD - No.  2) Functional Status - > 4 mets (Walk, run, climb stairs) Yes.  Marland Kitchen    3) Surgery Specific Risk -  Intermediate (Carotid, Head and Neck, Orthopaedic )  4) Further Noninvasive evaluation -   1) EKG - Yes.     1) Hx of obesity/ HLD  2) Echo - No.   1) Worsening dyspnea   3) Stress Testing - Active Cardiac Disease - No.  5) Need for medical therapy - Beta Blocker, Statins indicated ? No.  PE: Vitals:   04/19/21 1031  BP: 139/81  Pulse: (!) 58  Temp: (!) 97.4 F (36.3 C)  SpO2: 93%   Physical Examination: General appearance - alert, well appearing, and in no distress Mental status - alert, oriented to person, place, and time Eyes - pupils equal and reactive, extraocular eye movements intact, sclera anicteric Mouth - mucous membranes moist, pharynx normal without lesions; Mallampati 1 Neck - supple, no significant adenopathy Chest - clear to auscultation, no wheezes, rales or rhonchi, symmetric air entry Heart - normal rate, regular rhythm, normal S1, S2, no murmurs, rubs, clicks or gallops Musculoskeletal -ambulating with use of cane.  Gait is antalgic  A/P:  Preop examination - Plan: CMP14+EGFR, CBC, Protime-INR, TSH, Urinalysis  Morbid obesity (HCC) - Plan: CMP14+EGFR, TSH, Urinalysis, EKG 12-Lead  Pure hypercholesterolemia - Plan: CMP14+EGFR, TSH, EKG 12-Lead  Disc degeneration, lumbosacral - Plan: oxyCODONE-acetaminophen (PERCOCET)  7.5-325 MG tablet  Bilateral chronic knee pain - Plan: oxyCODONE-acetaminophen (PERCOCET) 7.5-325 MG tablet  Chronic left shoulder pain - Plan: oxyCODONE-acetaminophen (PERCOCET) 7.5-325 MG tablet  Chronic, continuous use of opioids  Controlled substance agreement signed - Plan: Drug Screen 10 W/Conf, Se  EKG without evidence of ischemia.  Preop labs have been ordered.  Urinalysis without evidence of protein, blood or other abnormalities.  I have independently evaluated patient.  Valerie Baker is a 65 y.o. female who is low risk for a intermediate risk surgery.  There are modifiable risk factors (obesity)  Will need to update controlled substance agreement at her follow-up visit.  Drug screen was ordered along with other labs today.  I have changed her prescription so that she may take her prescription every 8 hours if needed.  However, do not anticipate more than twice daily dosing until she has undergone surgery.  This was reinforced during her visit.  I would like to see her within 1 week postop   Alford [facility] Treat's RCRI/NSQIP calculation for MACE is: 0 (0.3%).    Carmyn Hamm M. Lajuana Ripple, [facility]

## 2021-04-25 ENCOUNTER — Other Ambulatory Visit: Payer: Self-pay | Admitting: Family Medicine

## 2021-04-28 LAB — CMP14+EGFR
ALT: 16 IU/L (ref 0–32)
AST: 21 IU/L (ref 0–40)
Albumin/Globulin Ratio: 1.9 (ref 1.2–2.2)
Albumin: 4.3 g/dL (ref 3.8–4.8)
Alkaline Phosphatase: 110 IU/L (ref 44–121)
BUN/Creatinine Ratio: 29 — ABNORMAL HIGH (ref 12–28)
BUN: 20 mg/dL (ref 8–27)
Bilirubin Total: 0.5 mg/dL (ref 0.0–1.2)
CO2: 27 mmol/L (ref 20–29)
Calcium: 9.9 mg/dL (ref 8.7–10.3)
Chloride: 101 mmol/L (ref 96–106)
Creatinine, Ser: 0.68 mg/dL (ref 0.57–1.00)
Globulin, Total: 2.3 g/dL (ref 1.5–4.5)
Glucose: 81 mg/dL (ref 65–99)
Potassium: 4.6 mmol/L (ref 3.5–5.2)
Sodium: 139 mmol/L (ref 134–144)
Total Protein: 6.6 g/dL (ref 6.0–8.5)
eGFR: 97 mL/min/{1.73_m2} (ref >59)

## 2021-04-28 LAB — DRUG SCREEN 10 W/CONF, SERUM
Amphetamines, IA: NEGATIVE ng/mL
Barbiturates, IA: NEGATIVE ug/mL
Benzodiazepines, IA: NEGATIVE ng/mL
Cocaine & Metabolite, IA: NEGATIVE ng/mL
Methadone, IA: NEGATIVE ng/mL
Opiates, IA: NEGATIVE ng/mL
Oxycodones, IA: POSITIVE ng/mL — AB
Phencyclidine, IA: NEGATIVE ng/mL
Propoxyphene, IA: NEGATIVE ng/mL
THC(Marijuana) Metabolite, IA: NEGATIVE ng/mL

## 2021-04-28 LAB — OXYCODONES,MS,WB/SP RFX
Oxycocone: 9.7 ng/mL
Oxycodones Confirmation: POSITIVE
Oxymorphone: NEGATIVE ng/mL

## 2021-04-28 LAB — CBC
Hematocrit: 38.2 % (ref 34.0–46.6)
Hemoglobin: 12.5 g/dL (ref 11.1–15.9)
MCH: 31 pg (ref 26.6–33.0)
MCHC: 32.7 g/dL (ref 31.5–35.7)
MCV: 95 fL (ref 79–97)
Platelets: 213 10*3/uL (ref 150–450)
RBC: 4.03 x10E6/uL (ref 3.77–5.28)
RDW: 12.5 % (ref 11.7–15.4)
WBC: 7.2 10*3/uL (ref 3.4–10.8)

## 2021-04-28 LAB — PROTIME-INR
INR: 1 (ref 0.9–1.2)
Prothrombin Time: 10.3 s (ref 9.1–12.0)

## 2021-04-28 LAB — TSH: TSH: 2.75 u[IU]/mL (ref 0.450–4.500)

## 2021-05-26 DIAGNOSIS — M419 Scoliosis, unspecified: Secondary | ICD-10-CM | POA: Diagnosis not present

## 2021-05-26 DIAGNOSIS — M47812 Spondylosis without myelopathy or radiculopathy, cervical region: Secondary | ICD-10-CM | POA: Diagnosis not present

## 2021-05-26 DIAGNOSIS — R7989 Other specified abnormal findings of blood chemistry: Secondary | ICD-10-CM | POA: Diagnosis not present

## 2021-05-26 DIAGNOSIS — M960 Pseudarthrosis after fusion or arthrodesis: Secondary | ICD-10-CM | POA: Diagnosis not present

## 2021-05-26 DIAGNOSIS — Z01812 Encounter for preprocedural laboratory examination: Secondary | ICD-10-CM | POA: Diagnosis not present

## 2021-05-26 DIAGNOSIS — T84296A Other mechanical complication of internal fixation device of vertebrae, initial encounter: Secondary | ICD-10-CM | POA: Diagnosis not present

## 2021-05-26 DIAGNOSIS — Z0389 Encounter for observation for other suspected diseases and conditions ruled out: Secondary | ICD-10-CM | POA: Diagnosis not present

## 2021-05-26 DIAGNOSIS — M4320 Fusion of spine, site unspecified: Secondary | ICD-10-CM | POA: Diagnosis not present

## 2021-05-26 DIAGNOSIS — R791 Abnormal coagulation profile: Secondary | ICD-10-CM | POA: Diagnosis not present

## 2021-05-30 ENCOUNTER — Other Ambulatory Visit: Payer: Self-pay

## 2021-05-30 ENCOUNTER — Ambulatory Visit (INDEPENDENT_AMBULATORY_CARE_PROVIDER_SITE_OTHER): Payer: Medicare Other

## 2021-05-30 DIAGNOSIS — Z23 Encounter for immunization: Secondary | ICD-10-CM

## 2021-06-06 DIAGNOSIS — T84216A Breakdown (mechanical) of internal fixation device of vertebrae, initial encounter: Secondary | ICD-10-CM | POA: Diagnosis not present

## 2021-06-06 DIAGNOSIS — M4326 Fusion of spine, lumbar region: Secondary | ICD-10-CM | POA: Diagnosis not present

## 2021-06-06 DIAGNOSIS — M532X6 Spinal instabilities, lumbar region: Secondary | ICD-10-CM | POA: Diagnosis not present

## 2021-06-06 DIAGNOSIS — M4805 Spinal stenosis, thoracolumbar region: Secondary | ICD-10-CM | POA: Diagnosis not present

## 2021-06-06 DIAGNOSIS — Z981 Arthrodesis status: Secondary | ICD-10-CM | POA: Diagnosis not present

## 2021-06-06 DIAGNOSIS — M4716 Other spondylosis with myelopathy, lumbar region: Secondary | ICD-10-CM | POA: Diagnosis not present

## 2021-06-06 DIAGNOSIS — T84296A Other mechanical complication of internal fixation device of vertebrae, initial encounter: Secondary | ICD-10-CM | POA: Diagnosis not present

## 2021-06-06 DIAGNOSIS — Z79899 Other long term (current) drug therapy: Secondary | ICD-10-CM | POA: Diagnosis not present

## 2021-06-06 DIAGNOSIS — M960 Pseudarthrosis after fusion or arthrodesis: Secondary | ICD-10-CM | POA: Diagnosis not present

## 2021-06-06 DIAGNOSIS — I10 Essential (primary) hypertension: Secondary | ICD-10-CM | POA: Diagnosis not present

## 2021-06-06 DIAGNOSIS — Z9889 Other specified postprocedural states: Secondary | ICD-10-CM | POA: Diagnosis not present

## 2021-06-12 ENCOUNTER — Telehealth: Payer: Self-pay

## 2021-06-12 NOTE — Telephone Encounter (Signed)
Transition Care Management Follow-up Telephone Call Date of discharge and from where: 06/07/2021  [facility] How have you been since you were released from the hospital? " Pretty good- just sore" Any questions or concerns? No  Items Reviewed: Did the pt receive and understand the discharge instructions provided? Yes  Medications obtained and verified? Yes  Other? No  Any new allergies since your discharge? No  Dietary orders reviewed? Yes Do you have support at home? Yes   Home Care and Equipment/Supplies: Were home health services ordered? Yes  patient reports she refused and she is doing her own exercises If so, what is the name of the agency?  Has the agency set up a time to come to the patient's home?  Were any new equipment or medical supplies ordered?   What is the name of the medical supply agency?  Were you able to get the supplies/equipment?  Do you have any questions related to the use of the equipment or supplies?   Functional Questionnaire: (I = Independent and D = Dependent) ADLs: I  Bathing/Dressing- I  SISTER assisted   Meal Prep- ASSISTANCE  Eating- I  Maintaining continence- I  Transferring/Ambulation- I  WITH WALKER  Managing Meds- I  Follow up appointments reviewed:  PCP Hospital f/u appt confirmed? Yes  Scheduled to see PCP on 06/21/2021 @ 1330. [place] [facility] f/u appt confirmed? Yes  Scheduled to see Surgeon in 3 weeks  Are transportation arrangements needed? No  If their condition worsens, is the pt aware to call PCP or go to the Emergency Dept.? Yes Was the patient provided with contact information for the PCP's office or ED? Yes Was to pt encouraged to call back with questions or concerns? Yes   Tomasa Rand, RN, BSN, CEN [facility] [employer] [phone]

## 2021-06-21 ENCOUNTER — Ambulatory Visit (INDEPENDENT_AMBULATORY_CARE_PROVIDER_SITE_OTHER): Payer: Medicare Other | Admitting: Family Medicine

## 2021-06-21 DIAGNOSIS — M5137 Other intervertebral disc degeneration, lumbosacral region: Secondary | ICD-10-CM | POA: Diagnosis not present

## 2021-06-21 DIAGNOSIS — Z981 Arthrodesis status: Secondary | ICD-10-CM

## 2021-06-21 NOTE — Progress Notes (Signed)
Telephone visit  Subjective: CC: follow up Back pain, and recent lumbar fusion PCP: Janora Norlander, DO YYT:[id number] Valerie Baker is a 65 y.o. female calls for telephone consult today. Patient provides verbal consent for consult held via phone.  Due to COVID-19 pandemic this visit was conducted virtually. This visit type was conducted due to national recommendations for restrictions regarding the COVID-19 Pandemic (e.g. social distancing, sheltering in place) in an effort to limit this patient's exposure and mitigate transmission in our community. All issues noted in this document were discussed and addressed.  A physical exam was not performed with this format.   Location of patient: home Location of provider: [facility] Others present for call: none  1. S/p lumbar fusion She reports things have been rough since surgery.  She has been having some pain in her right knee.  In fact she fell when her knee gave out from underneath her and now her left side hurts some.  She reports normal urine output and BMs.  Has good support from family to help.  Her follow up with surgeon is 11/22.  She reports using Percocet 10/325 up to 3 times daily post op.  She has a good amount of the 7.5/325 on hand.   ROS: Per HPI  Allergies  Allergen Reactions   Sulfonamide Derivatives Rash   Past Medical History:  Diagnosis Date   Adenomatous colon polyp 06/13/2006   Allergy    SINUS   Anxiety    Anxiety and depression    Arthritis    Asthma    Bladder incontinence    Blood transfusion    Cataract    Depression    Fibromyalgia    GERD (gastroesophageal reflux disease)    Hyperlipidemia    Hypertension    IBS (irritable bowel syndrome)    Kidney stones    Migraines    Obesity    Osteopenia    Post-operative nausea and vomiting     Current Outpatient Medications:    albuterol (PROVENTIL HFA;VENTOLIN HFA) 108 (90 Base) MCG/ACT inhaler, Inhale 2 puffs into the lungs every 6 (six) hours as needed for  wheezing or shortness of breath., Disp: 1 Inhaler, Rfl: 0   Ascorbic Acid (VITAMIN C PO), Take by mouth., Disp: , Rfl:    baclofen (LIORESAL) 10 MG tablet, TAKE 1/2 TO 1 TABLET BY  MOUTH 3 TIMES DAILY AS  NEEDED FOR MUSCLE SPASMS, Disp: 270 tablet, Rfl: 2   Calcium Carbonate-Vitamin D 600-400 MG-UNIT tablet, Take 1 tablet by mouth 2 (two) times daily., Disp: , Rfl:    Cholecalciferol (VITAMIN D3) 2000 UNITS TABS, Take 1 tablet by mouth daily., Disp: , Rfl:    citalopram (CELEXA) 20 MG tablet, TAKE 1 TABLET BY MOUTH  DAILY, Disp: 90 tablet, Rfl: 3   diclofenac Sodium (VOLTAREN) 1 % GEL, Apply 4 g topically 4 (four) times daily., Disp: 300 g, Rfl: 5   fluticasone (FLONASE) 50 MCG/ACT nasal spray, Place 2 sprays into both nostrils daily., Disp: 16 g, Rfl: 6   gabapentin (NEURONTIN) 400 MG capsule, TAKE 2 CAPSULES BY MOUTH 3  TIMES DAILY, Disp: 540 capsule, Rfl: 3   L-Lysine 1000 MG TABS, Take by mouth., Disp: , Rfl:    loratadine (CLARITIN) 10 MG tablet, Take 10 mg by mouth daily as needed for allergies., Disp: , Rfl:    meloxicam (MOBIC) 15 MG tablet, Take 1 tablet (15 mg total) by mouth daily., Disp: 90 tablet, Rfl: 3   Multiple Vitamin (MULTIVITAMIN) tablet,  Take 1 tablet by mouth daily., Disp: , Rfl:    Omega-3 Fatty Acids (FISH OIL) 1000 MG CAPS, Take by mouth., Disp: , Rfl:    omeprazole (PRILOSEC) 40 MG capsule, Take 1 capsule (40 mg total) by mouth 2 (two) times daily., Disp: 180 capsule, Rfl: 3   oxybutynin (DITROPAN) 5 MG tablet, Take 1 tablet (5 mg total) by mouth 2 (two) times daily., Disp: 180 tablet, Rfl: 3   [START ON 07/04/2021] oxyCODONE-acetaminophen (PERCOCET) 7.5-325 MG tablet, Take 1 tablet by mouth every 8 (eight) hours as needed for severe pain., Disp: 60 tablet, Rfl: 0   oxyCODONE-acetaminophen (PERCOCET) 7.5-325 MG tablet, Take 1 tablet by mouth every 8 (eight) hours as needed for severe pain., Disp: 60 tablet, Rfl: 0   oxyCODONE-acetaminophen (PERCOCET) 7.5-325 MG tablet,  Take 1 tablet by mouth every 8 (eight) hours as needed for severe pain., Disp: 60 tablet, Rfl: 0   traZODone (DESYREL) 100 MG tablet, TAKE 1 TABLET BY MOUTH AT  BEDTIME, Disp: 90 tablet, Rfl: 3   triamcinolone cream (KENALOG) 0.1 %, Apply topically 2 (two) times daily., Disp: 453.6 g, Rfl: 5   VITAMIN E PO, Take by mouth., Disp: , Rfl:   Assessment/ Plan: 65 y.o. female   Disc degeneration, lumbosacral  S/P lumbar fusion  Sounds like she is doing pretty good postop.  She did have a little fall and I advised her that if she has stuff like this going forward she needs to contact her surgeon, particular if she is having any concerning symptoms or signs or even exacerbation in her pain.  I would worry about her reinjuring herself after surgery.  She did not endorse any concerning symptoms or signs.  She is currently being treated with a higher dose of Percocet postop and is doing relatively well with this 2-3 times per day.  I think this is appropriate.  She has postdated scripts from me and we will plan to see each other sometime in January for her physical exam.  This has been scheduled.  Start time: 12:17pm End time: 12:24pm  Total time spent on patient care (including telephone call/ virtual visit): 7 minutes  [place], [place] [phone]

## 2021-07-04 DIAGNOSIS — M4325 Fusion of spine, thoracolumbar region: Secondary | ICD-10-CM | POA: Diagnosis not present

## 2021-07-04 DIAGNOSIS — M791 Myalgia, unspecified site: Secondary | ICD-10-CM | POA: Diagnosis not present

## 2021-07-19 ENCOUNTER — Telehealth: Payer: Self-pay | Admitting: Family Medicine

## 2021-07-19 NOTE — Telephone Encounter (Signed)
Declined AWV - stated that [facility] does this and she did not want to repeat with our nurse for our records.  May do with [facility] in the furture

## 2021-07-27 DIAGNOSIS — M19071 Primary osteoarthritis, right ankle and foot: Secondary | ICD-10-CM | POA: Diagnosis not present

## 2021-07-27 DIAGNOSIS — M79674 Pain in right toe(s): Secondary | ICD-10-CM | POA: Diagnosis not present

## 2021-08-21 ENCOUNTER — Encounter: Payer: Self-pay | Admitting: Family Medicine

## 2021-08-21 ENCOUNTER — Ambulatory Visit (INDEPENDENT_AMBULATORY_CARE_PROVIDER_SITE_OTHER): Payer: Medicare Other | Admitting: Family Medicine

## 2021-08-21 VITALS — BP 154/101 | HR 57 | Temp 97.8°F | Ht 64.0 in | Wt 246.4 lb

## 2021-08-21 DIAGNOSIS — Z23 Encounter for immunization: Secondary | ICD-10-CM | POA: Diagnosis not present

## 2021-08-21 DIAGNOSIS — Z79899 Other long term (current) drug therapy: Secondary | ICD-10-CM | POA: Diagnosis not present

## 2021-08-21 DIAGNOSIS — M25512 Pain in left shoulder: Secondary | ICD-10-CM | POA: Diagnosis not present

## 2021-08-21 DIAGNOSIS — Z0000 Encounter for general adult medical examination without abnormal findings: Secondary | ICD-10-CM

## 2021-08-21 DIAGNOSIS — M25561 Pain in right knee: Secondary | ICD-10-CM | POA: Diagnosis not present

## 2021-08-21 DIAGNOSIS — K219 Gastro-esophageal reflux disease without esophagitis: Secondary | ICD-10-CM

## 2021-08-21 DIAGNOSIS — M25562 Pain in left knee: Secondary | ICD-10-CM

## 2021-08-21 DIAGNOSIS — Z981 Arthrodesis status: Secondary | ICD-10-CM

## 2021-08-21 DIAGNOSIS — G8929 Other chronic pain: Secondary | ICD-10-CM

## 2021-08-21 DIAGNOSIS — M5137 Other intervertebral disc degeneration, lumbosacral region: Secondary | ICD-10-CM | POA: Diagnosis not present

## 2021-08-21 DIAGNOSIS — R739 Hyperglycemia, unspecified: Secondary | ICD-10-CM | POA: Diagnosis not present

## 2021-08-21 DIAGNOSIS — Z0001 Encounter for general adult medical examination with abnormal findings: Secondary | ICD-10-CM

## 2021-08-21 DIAGNOSIS — R350 Frequency of micturition: Secondary | ICD-10-CM

## 2021-08-21 DIAGNOSIS — R030 Elevated blood-pressure reading, without diagnosis of hypertension: Secondary | ICD-10-CM | POA: Diagnosis not present

## 2021-08-21 DIAGNOSIS — F339 Major depressive disorder, recurrent, unspecified: Secondary | ICD-10-CM

## 2021-08-21 DIAGNOSIS — E7800 Pure hypercholesterolemia, unspecified: Secondary | ICD-10-CM | POA: Diagnosis not present

## 2021-08-21 DIAGNOSIS — F1190 Opioid use, unspecified, uncomplicated: Secondary | ICD-10-CM

## 2021-08-21 LAB — CMP14+EGFR
ALT: 29 IU/L (ref 0–32)
AST: 27 IU/L (ref 0–40)
Albumin/Globulin Ratio: 1.7 (ref 1.2–2.2)
Albumin: 4 g/dL (ref 3.8–4.8)
Alkaline Phosphatase: 138 IU/L — ABNORMAL HIGH (ref 44–121)
BUN/Creatinine Ratio: 21 (ref 12–28)
BUN: 14 mg/dL (ref 8–27)
Bilirubin Total: 0.4 mg/dL (ref 0.0–1.2)
CO2: 26 mmol/L (ref 20–29)
Calcium: 9.5 mg/dL (ref 8.7–10.3)
Chloride: 104 mmol/L (ref 96–106)
Creatinine, Ser: 0.68 mg/dL (ref 0.57–1.00)
Globulin, Total: 2.4 g/dL (ref 1.5–4.5)
Glucose: 89 mg/dL (ref 70–99)
Potassium: 4.5 mmol/L (ref 3.5–5.2)
Sodium: 143 mmol/L (ref 134–144)
Total Protein: 6.4 g/dL (ref 6.0–8.5)
eGFR: 97 mL/min/{1.73_m2} (ref >59)

## 2021-08-21 LAB — BAYER DCA HB A1C WAIVED: HB A1C (BAYER DCA - WAIVED): 5.5 % (ref 4.8–5.6)

## 2021-08-21 LAB — LIPID PANEL
Chol/HDL Ratio: 3.1 ratio (ref 0.0–4.4)
Cholesterol, Total: 219 mg/dL — ABNORMAL HIGH (ref 100–199)
HDL: 71 mg/dL (ref >39)
LDL Chol Calc (NIH): 125 mg/dL — ABNORMAL HIGH (ref 0–99)
Triglycerides: 129 mg/dL (ref 0–149)
VLDL Cholesterol Cal: 23 mg/dL (ref 5–40)

## 2021-08-21 MED ORDER — GABAPENTIN 400 MG PO CAPS: ORAL_CAPSULE | ORAL | 3 refills | Status: DC

## 2021-08-21 MED ORDER — OXYCODONE-ACETAMINOPHEN 7.5-325 MG PO TABS: 1.0000 | ORAL_TABLET | Freq: Three times a day (TID) | ORAL | 0 refills | Status: DC | PRN

## 2021-08-21 MED ORDER — TRAZODONE HCL 100 MG PO TABS: 100.0000 mg | ORAL_TABLET | Freq: Every day | ORAL | 3 refills | Status: DC

## 2021-08-21 MED ORDER — OMEPRAZOLE 40 MG PO CPDR: 40.0000 mg | DELAYED_RELEASE_CAPSULE | Freq: Two times a day (BID) | ORAL | 3 refills | Status: DC

## 2021-08-21 MED ORDER — MELOXICAM 15 MG PO TABS: 15.0000 mg | ORAL_TABLET | Freq: Every day | ORAL | 3 refills | Status: DC

## 2021-08-21 MED ORDER — OXYBUTYNIN CHLORIDE 5 MG PO TABS: 5.0000 mg | ORAL_TABLET | Freq: Two times a day (BID) | ORAL | 3 refills | Status: DC

## 2021-08-21 NOTE — Patient Instructions (Signed)
I'm going to try and get you either Saxenda or [facility] for weight loss.  Let me talk to [name], our pharmacist, tomorrow to see how we can get something covered for you.  In the meantime, get your knee shot.  I don't want you falling.  Calorie Counting for Weight Loss Calories are units of energy. Your body needs a certain number of calories from food to keep going throughout the day. When you eat or drink more calories than your body needs, your body stores the extra calories mostly as fat. When you eat or drink fewer calories than your body needs, your body burns fat to get the energy it needs. Calorie counting means keeping track of how many calories you eat and drink each day. Calorie counting can be helpful if you need to lose weight. If you eat fewer calories than your body needs, you should lose weight. Ask your health care provider what a healthy weight is for you. For calorie counting to work, you will need to eat the right number of calories each day to lose a healthy amount of weight per week. A dietitian can help you figure out how many calories you need in a day and will suggest ways to reach your calorie goal. A healthy amount of weight to lose each week is usually 1-2 lb (0.5-0.9 kg). This usually means that your daily calorie intake should be reduced by 500-750 calories. Eating 1,200-1,500 calories a day can help most women lose weight. Eating 1,500-1,800 calories a day can help most men lose weight. What do I need to know about calorie counting? Work with your health care provider or dietitian to determine how many calories you should get each day. To meet your daily calorie goal, you will need to: Find out how many calories are in each food that you would like to eat. Try to do this before you eat. Decide how much of the food you plan to eat. Keep a food log. Do this by writing down what you ate and how many calories it had. To successfully lose weight, it is important to balance  calorie counting with a healthy lifestyle that includes regular activity. Where do I find calorie information? The number of calories in a food can be found on a Nutrition Facts label. If a food does not have a Nutrition Facts label, try to look up the calories online or ask your dietitian for help. Remember that calories are listed per serving. If you choose to have more than one serving of a food, you will have to multiply the calories per serving by the number of servings you plan to eat. For example, the label on a package of bread might say that a serving size is 1 slice and that there are 90 calories in a serving. If you eat 1 slice, you will have eaten 90 calories. If you eat 2 slices, you will have eaten 180 calories. How do I keep a food log? After each time that you eat, record the following in your food log as soon as possible: What you ate. Be sure to include toppings, sauces, and other extras on the food. How much you ate. This can be measured in cups, ounces, or number of items. How many calories were in each food and drink. The total number of calories in the food you ate. Keep your food log near you, such as in a pocket-sized notebook or on an app or website on your mobile phone. Some  programs will calculate calories for you and show you how many calories you have left to meet your daily goal. What are some portion-control tips? Know how many calories are in a serving. This will help you know how many servings you can have of a certain food. Use a measuring cup to measure serving sizes. You could also try weighing out portions on a kitchen scale. With time, you will be able to estimate serving sizes for some foods. Take time to put servings of different foods on your favorite plates or in your favorite bowls and cups so you know what a serving looks like. Try not to eat straight from a food's packaging, such as from a bag or box. Eating straight from the package makes it hard to see  how much you are eating and can lead to overeating. Put the amount you would like to eat in a cup or on a plate to make sure you are eating the right portion. Use smaller plates, glasses, and bowls for smaller portions and to prevent overeating. Try not to multitask. For example, avoid watching TV or using your computer while eating. If it is time to eat, sit down at a table and enjoy your food. This will help you recognize when you are full. It will also help you be more mindful of what and how much you are eating. What are tips for following this plan? Reading food labels Check the calorie count compared with the serving size. The serving size may be smaller than what you are used to eating. Check the source of the calories. Try to choose foods that are high in protein, fiber, and vitamins, and low in saturated fat, trans fat, and sodium. Shopping Read nutrition labels while you shop. This will help you make healthy decisions about which foods to buy. Pay attention to nutrition labels for low-fat or fat-free foods. These foods sometimes have the same number of calories or more calories than the full-fat versions. They also often have added sugar, starch, or salt to make up for flavor that was removed with the fat. Make a grocery list of lower-calorie foods and stick to it. Cooking Try to cook your favorite foods in a healthier way. For example, try baking instead of frying. Use low-fat dairy products. Meal planning Use more fruits and vegetables. One-half of your plate should be fruits and vegetables. Include lean proteins, such as chicken, [place], and fish. Lifestyle Each week, aim to do one of the following: 150 minutes of moderate exercise, such as walking. 75 minutes of vigorous exercise, such as running. General information Know how many calories are in the foods you eat most often. This will help you calculate calorie counts faster. Find a way of tracking calories that works for you.  Get creative. Try different apps or programs if writing down calories does not work for you. What foods should I eat?  Eat nutritious foods. It is better to have a nutritious, high-calorie food, such as an avocado, than a food with few nutrients, such as a bag of potato chips. Use your calories on foods and drinks that will fill you up and will not leave you hungry soon after eating. Examples of foods that fill you up are nuts and nut butters, vegetables, lean proteins, and high-fiber foods such as whole grains. High-fiber foods are foods with more than 5 g of fiber per serving. Pay attention to calories in drinks. Low-calorie drinks include water and unsweetened drinks. The items listed  above may not be a complete list of foods and beverages you can eat. Contact a dietitian for more information. What foods should I limit? Limit foods or drinks that are not good sources of vitamins, minerals, or protein or that are high in unhealthy fats. These include: Candy. Other sweets. Sodas, specialty coffee drinks, alcohol, and juice. The items listed above may not be a complete list of foods and beverages you should avoid. Contact a dietitian for more information. How do I count calories when eating out? Pay attention to portions. Often, portions are much larger when eating out. Try these tips to keep portions smaller: Consider sharing a meal instead of getting your own. If you get your own meal, eat only half of it. Before you start eating, ask for a container and put half of your meal into it. When available, consider ordering smaller portions from the menu instead of full portions. Pay attention to your food and drink choices. Knowing the way food is cooked and what is included with the meal can help you eat fewer calories. If calories are listed on the menu, choose the lower-calorie options. Choose dishes that include vegetables, fruits, whole grains, low-fat dairy products, and lean  proteins. Choose items that are boiled, broiled, grilled, or steamed. Avoid items that are buttered, battered, fried, or served with cream sauce. Items labeled as crispy are usually fried, unless stated otherwise. Choose water, low-fat milk, unsweetened iced tea, or other drinks without added sugar. If you want an alcoholic beverage, choose a lower-calorie option, such as a glass of wine or light beer. Ask for dressings, sauces, and syrups on the side. These are usually high in calories, so you should limit the amount you eat. If you want a salad, choose a garden salad and ask for grilled meats. Avoid extra toppings such as bacon, cheese, or fried items. Ask for the dressing on the side, or ask for olive oil and vinegar or lemon to use as dressing. Estimate how many servings of a food you are given. Knowing serving sizes will help you be aware of how much food you are eating at restaurants. Where to find more information Centers for Disease Control and Prevention: http://www.wolf.info/ [place] Department of Agriculture: http://www.wilson-mendoza.org/ Summary Calorie counting means keeping track of how many calories you eat and drink each day. If you eat fewer calories than your body needs, you should lose weight. A healthy amount of weight to lose per week is usually 1-2 lb (0.5-0.9 kg). This usually means reducing your daily calorie intake by 500-750 calories. The number of calories in a food can be found on a Nutrition Facts label. If a food does not have a Nutrition Facts label, try to look up the calories online or ask your dietitian for help. Use smaller plates, glasses, and bowls for smaller portions and to prevent overeating. Use your calories on foods and drinks that will fill you up and not leave you hungry shortly after a meal. This information is not intended to replace advice given to you by your health care provider. Make sure you discuss any questions you have with your health care provider. Document Revised:  09/10/2019 Document Reviewed: 09/10/2019 Elsevier Patient Education  2022 [employer].

## 2021-08-21 NOTE — Progress Notes (Signed)
Valerie Baker is a 66 y.o. female presents to office today for annual physical exam examination.    Concerns today include: 1.  Chronic pain syndrome Back pain is under fair control status postsurgical intervention but she continues to have quite a bit of pain in the right knee, such that she has been limping.  She typically gets a corticosteroid injection but has not had one in a while.  She is too embarrassed to go back to the orthopedist due to her weight gain over the last year understands that symptoms are getting bad enough that she will have to see them.  They told her that she would have to lose weight before they would proceed with any surgical intervention.  She has been utilizing her Percocet 7.5 mg twice daily fairly consistently since her surgery on her back.  No constipation, excessive daytime sedation.  Diet: No restricting, Exercise: No structured Last eye exam: Up-to-date Last dental exam: Up-to-date Last colonoscopy: Up-to-date Last mammogram: Up-to-date Last pap smear: n/a Refills needed today: Percocet Immunizations needed: Pneumococcal and shingles vaccination Immunization History  Administered Date(s) Administered   Fluad Quad(high Dose 65+) 05/30/2021   H1N1 08/26/2008   Influenza,inj,Quad PF,6+ Mos 06/01/2015, 05/21/2016, 06/24/2017, 06/17/2018, 06/10/2019, 06/16/2020   Influenza,inj,quad, With Preservative 05/14/2019   Influenza-Unspecified 05/13/2013, 05/26/2014   Moderna Sars-Covid-2 Vaccination 02/17/2020, 03/16/2020   Tdap 11/04/2014     Past Medical History:  Diagnosis Date   Adenomatous colon polyp 06/13/2006   Allergy    SINUS   Anxiety    Anxiety and depression    Arthritis    Asthma    Bladder incontinence    Blood transfusion    Cataract    Depression    Fibromyalgia    GERD (gastroesophageal reflux disease)    Hyperlipidemia    Hypertension    IBS (irritable bowel syndrome)    Kidney stones    Migraines    Obesity     Osteopenia    Post-operative nausea and vomiting    Social History   Socioeconomic History   Marital status: Married    Spouse name: Not on file   Number of children: 3   Years of education: Not on file   Highest education level: Not on file  Occupational History   Occupation: Disabled  Tobacco Use   Smoking status: Former    Years: 5.00    Types: Cigarettes    Quit date: 11/02/2005    Years since quitting: 15.8   Smokeless tobacco: Never  Vaping Use   Vaping Use: Never used  Substance and Sexual Activity   Alcohol use: No   Drug use: No   Sexual activity: Yes  Other Topics Concern   Not on file  Social History Narrative   Not on file   Social Determinants of Health   Financial Resource Strain: Not on file  Food Insecurity: Not on file  Transportation Needs: Not on file  Physical Activity: Not on file  Stress: Not on file  Social Connections: Not on file  Intimate Partner Violence: Not on file   Past Surgical History:  Procedure Laterality Date   ABDOMINAL HYSTERECTOMY     APPENDECTOMY     BACK SURGERY  2000   BREAST BIOPSY Bilateral 11/26/2017   FIBROCYSTIC CHANGES WITH CALCIFICATIONS    COLONOSCOPY  06/12/2011   [facility] Plan, 03/17/21 MS   DEBRIDEMENT TENNIS ELBOW     ELBOW SURGERY  2003   EYE SURGERY     FOOT  SURGERY  1994   right   LUMBAR FUSION  2007   LUMBAR FUSION  2002,2003,2007,2008   NASAL SINUS SURGERY  3500   NISSEN FUNDOPLICATION  9381   ROTATOR CUFF REPAIR  [id number]   SPINAL CORD STIMULATOR IMPLANT  2005   TOTAL KNEE ARTHROPLASTY  2008,2009   Family History  Problem Relation Age of Onset   Heart disease Mother    Diabetes Mother    Melanoma Mother        nose   Stroke Father    Colon polyps Sister    Hypertension Sister    Diabetes Brother    Cirrhosis Brother    Hypertension Brother    Colon polyps Brother    Hypertension Brother    Stomach cancer Other    Rectal cancer Other    Esophageal cancer Other    Colon cancer Other      Current Outpatient Medications:    albuterol (PROVENTIL HFA;VENTOLIN HFA) 108 (90 Base) MCG/ACT inhaler, Inhale 2 puffs into the lungs every 6 (six) hours as needed for wheezing or shortness of breath., Disp: 1 Inhaler, Rfl: 0   Ascorbic Acid (VITAMIN C PO), Take by mouth., Disp: , Rfl:    baclofen (LIORESAL) 10 MG tablet, TAKE 1/2 TO 1 TABLET BY  MOUTH 3 TIMES DAILY AS  NEEDED FOR MUSCLE SPASMS, Disp: 270 tablet, Rfl: 2   Calcium Carbonate-Vitamin D 600-400 MG-UNIT tablet, Take 1 tablet by mouth 2 (two) times daily., Disp: , Rfl:    Cholecalciferol (VITAMIN D3) 2000 UNITS TABS, Take 1 tablet by mouth daily., Disp: , Rfl:    citalopram (CELEXA) 20 MG tablet, TAKE 1 TABLET BY MOUTH  DAILY, Disp: 90 tablet, Rfl: 3   diclofenac Sodium (VOLTAREN) 1 % GEL, Apply 4 g topically 4 (four) times daily., Disp: 300 g, Rfl: 5   fluticasone (FLONASE) 50 MCG/ACT nasal spray, Place 2 sprays into both nostrils daily., Disp: 16 g, Rfl: 6   gabapentin (NEURONTIN) 400 MG capsule, TAKE 2 CAPSULES BY MOUTH 3  TIMES DAILY, Disp: 540 capsule, Rfl: 3   L-Lysine 1000 MG TABS, Take by mouth., Disp: , Rfl:    loratadine (CLARITIN) 10 MG tablet, Take 10 mg by mouth daily as needed for allergies., Disp: , Rfl:    meloxicam (MOBIC) 15 MG tablet, Take 1 tablet (15 mg total) by mouth daily., Disp: 90 tablet, Rfl: 3   Multiple Vitamin (MULTIVITAMIN) tablet, Take 1 tablet by mouth daily., Disp: , Rfl:    Omega-3 Fatty Acids (FISH OIL) 1000 MG CAPS, Take by mouth., Disp: , Rfl:    omeprazole (PRILOSEC) 40 MG capsule, Take 1 capsule (40 mg total) by mouth 2 (two) times daily., Disp: 180 capsule, Rfl: 3   oxybutynin (DITROPAN) 5 MG tablet, Take 1 tablet (5 mg total) by mouth 2 (two) times daily., Disp: 180 tablet, Rfl: 3   oxyCODONE-acetaminophen (PERCOCET) 7.5-325 MG tablet, Take 1 tablet by mouth every 8 (eight) hours as needed for severe pain., Disp: 60 tablet, Rfl: 0   traZODone (DESYREL) 100 MG tablet, TAKE 1 TABLET BY MOUTH  AT  BEDTIME, Disp: 90 tablet, Rfl: 3   triamcinolone cream (KENALOG) 0.1 %, Apply topically 2 (two) times daily., Disp: 453.6 g, Rfl: 5   VITAMIN E PO, Take by mouth., Disp: , Rfl:    oxyCODONE-acetaminophen (PERCOCET) 7.5-325 MG tablet, Take 1 tablet by mouth every 8 (eight) hours as needed for severe pain. (Patient not taking: Reported on 08/21/2021), Disp: 60 tablet,  Rfl: 0   oxyCODONE-acetaminophen (PERCOCET) 7.5-325 MG tablet, Take 1 tablet by mouth every 8 (eight) hours as needed for severe pain. (Patient not taking: Reported on 08/21/2021), Disp: 60 tablet, Rfl: 0  Allergies  Allergen Reactions   Sulfonamide Derivatives Rash     ROS: Review of Systems Pertinent items noted in HPI and remainder of comprehensive ROS otherwise negative.    Physical exam BP (!) 154/101    Pulse (!) 57    Temp 97.8 F (36.6 C)    Ht $R'5\' 4"'Dh$  (1.626 m)    Wt 246 lb 6.4 oz (111.8 kg)    SpO2 98%    BMI 42.29 kg/m  General appearance: alert, cooperative, appears stated age, and morbidly obese Head: Normocephalic, without obvious abnormality, atraumatic Eyes: negative findings: lids and lashes normal, conjunctivae and sclerae normal, corneas clear, and pupils equal, round, reactive to light and accomodation Ears: normal TM's and external ear canals both ears Nose: Nares normal. Septum midline. Mucosa normal. No drainage or sinus tenderness. Throat: lips, mucosa, and tongue normal; teeth and gums normal Neck: no adenopathy, supple, symmetrical, trachea midline, and thyroid not enlarged, symmetric, no tenderness/mass/nodules Back: symmetric, no curvature. ROM normal. No CVA tenderness. Lungs: clear to auscultation bilaterally Heart: regular rate and rhythm, S1, S2 normal, no murmur, click, rub or gallop Abdomen: soft, non-tender; bowel sounds normal; no masses,  no organomegaly Extremities: extremities normal, atraumatic, no cyanosis or edema Pulses: 2+ and symmetric Skin: Skin color, texture, turgor normal. No  rashes or lesions Lymph nodes: Cervical, supraclavicular, and axillary nodes normal. Neurologic: Grossly normal MSK: Antalgic gait.  She has a visible limp and requires some assistance for ambulation.  She has essentially lost ability to extend her C-spine secondary to fusion history.  Lab Results  Component Value Date   HGBA1C 5.5 08/21/2021    Assessment/ Plan: Valerie Baker here for annual physical exam.   Annual physical exam  Elevated blood pressure reading in office without diagnosis of hypertension  Need for pneumococcal vaccination - Plan: Pneumococcal conjugate vaccine 20-valent (Prevnar 20)  Chronic left shoulder pain - Plan: ToxASSURE Select 13 (MW), Urine, oxyCODONE-acetaminophen (PERCOCET) 7.5-325 MG tablet  Bilateral chronic knee pain - Plan: oxyCODONE-acetaminophen (PERCOCET) 7.5-325 MG tablet  S/P lumbar fusion  Chronic, continuous use of opioids - Plan: ToxASSURE Select 13 (MW), Urine  Controlled substance agreement signed - Plan: ToxASSURE Select 13 (MW), Urine  Morbid obesity ([place]) - Plan: Lipid panel, CMP14+EGFR, Bayer DCA Hb A1c Waived, Bayer DCA Hb A1c Waived, CMP14+EGFR, Lipid panel  Pure hypercholesterolemia - Plan: Lipid panel, CMP14+EGFR, CMP14+EGFR, Lipid panel  Disc degeneration, lumbosacral - Plan: oxyCODONE-acetaminophen (PERCOCET) 7.5-325 MG tablet, gabapentin (NEURONTIN) 400 MG capsule, meloxicam (MOBIC) 15 MG tablet  Gastroesophageal reflux disease without esophagitis - Plan: omeprazole (PRILOSEC) 40 MG capsule  Urinary frequency - Plan: oxybutynin (DITROPAN) 5 MG tablet  Depression, recurrent ([facility]) - Plan: traZODone (DESYREL) 100 MG tablet  Suspect BP related to uncontrolled pain today.  She will follow-up in 2 weeks with nurse for blood pressure recheck  Pneumococcal vaccination and shingles vaccinations administered today.  She is up-to-date on preventive care otherwise  Continues to have quite a bit of pain in the right knee.   Recommended follow-up with orthopedist.  We discussed consideration for medication to help with weight loss since this seems to be her biggest barrier to proceeding with surgery.  We discussed consideration for [facility] or Saxenda.  I will reach out to our pharmacist to see if either  might be covered under her current plan.  Do not think that she is a good candidate for phentermine, particularly given blood pressure elevation today.  Though this may be reflecting her pain today  Controlled substance contract, urine drug screen were obtained as per office policy for ongoing opioid management  Check fasting lipid, A1c, CMP given morbid obesity  GERD is stable on PPI has been renewed  Ditropan controlling urinary frequency fairly good  Trazodone renewed  The Narcotic Database has been reviewed.  There were no red flags.    Counseled on healthy lifestyle choices, including diet (rich in fruits, vegetables and lean meats and low in salt and simple carbohydrates) and exercise (at least 30 minutes of moderate physical activity daily).  Patient to follow up in 1 year for annual exam or sooner if needed.  Aboubacar Matsuo M. Lajuana Ripple, DO

## 2021-08-24 LAB — TOXASSURE SELECT 13 (MW), URINE

## 2021-08-30 DIAGNOSIS — M545 Low back pain, unspecified: Secondary | ICD-10-CM | POA: Diagnosis not present

## 2021-09-04 ENCOUNTER — Ambulatory Visit: Payer: Medicare Other

## 2021-09-04 DIAGNOSIS — G8929 Other chronic pain: Secondary | ICD-10-CM

## 2021-09-04 DIAGNOSIS — Z0130 Encounter for examination of blood pressure without abnormal findings: Secondary | ICD-10-CM

## 2021-09-04 NOTE — Progress Notes (Signed)
Patient here today for blood pressure check.  Blood pressure was 136/90, pulse 66.  Blood pressure at home this morning was 129/75.

## 2021-09-29 DIAGNOSIS — M1711 Unilateral primary osteoarthritis, right knee: Secondary | ICD-10-CM | POA: Diagnosis not present

## 2021-09-29 DIAGNOSIS — Z96652 Presence of left artificial knee joint: Secondary | ICD-10-CM | POA: Diagnosis not present

## 2021-10-17 DIAGNOSIS — I10 Essential (primary) hypertension: Secondary | ICD-10-CM | POA: Diagnosis not present

## 2021-10-17 DIAGNOSIS — M545 Low back pain, unspecified: Secondary | ICD-10-CM | POA: Diagnosis not present

## 2021-10-17 DIAGNOSIS — M532X6 Spinal instabilities, lumbar region: Secondary | ICD-10-CM | POA: Diagnosis not present

## 2021-10-17 DIAGNOSIS — M4325 Fusion of spine, thoracolumbar region: Secondary | ICD-10-CM | POA: Diagnosis not present

## 2021-10-20 DIAGNOSIS — M7062 Trochanteric bursitis, left hip: Secondary | ICD-10-CM | POA: Diagnosis not present

## 2021-10-20 DIAGNOSIS — M25552 Pain in left hip: Secondary | ICD-10-CM | POA: Diagnosis not present

## 2021-11-03 DIAGNOSIS — M25512 Pain in left shoulder: Secondary | ICD-10-CM | POA: Diagnosis not present

## 2021-11-14 ENCOUNTER — Other Ambulatory Visit: Payer: Self-pay | Admitting: Family Medicine

## 2021-11-14 DIAGNOSIS — Z1231 Encounter for screening mammogram for malignant neoplasm of breast: Secondary | ICD-10-CM

## 2021-11-20 DIAGNOSIS — M1612 Unilateral primary osteoarthritis, left hip: Secondary | ICD-10-CM | POA: Diagnosis not present

## 2021-11-21 ENCOUNTER — Encounter: Payer: Self-pay | Admitting: Family Medicine

## 2021-11-21 ENCOUNTER — Ambulatory Visit (INDEPENDENT_AMBULATORY_CARE_PROVIDER_SITE_OTHER): Payer: Medicare Other | Admitting: Family Medicine

## 2021-11-21 VITALS — BP 139/84 | HR 62 | Temp 98.0°F | Ht 64.0 in | Wt 229.4 lb

## 2021-11-21 DIAGNOSIS — F339 Major depressive disorder, recurrent, unspecified: Secondary | ICD-10-CM | POA: Diagnosis not present

## 2021-11-21 DIAGNOSIS — F1190 Opioid use, unspecified, uncomplicated: Secondary | ICD-10-CM

## 2021-11-21 DIAGNOSIS — G8929 Other chronic pain: Secondary | ICD-10-CM

## 2021-11-21 DIAGNOSIS — Z23 Encounter for immunization: Secondary | ICD-10-CM

## 2021-11-21 DIAGNOSIS — R030 Elevated blood-pressure reading, without diagnosis of hypertension: Secondary | ICD-10-CM | POA: Diagnosis not present

## 2021-11-21 DIAGNOSIS — M25561 Pain in right knee: Secondary | ICD-10-CM

## 2021-11-21 DIAGNOSIS — M25562 Pain in left knee: Secondary | ICD-10-CM | POA: Diagnosis not present

## 2021-11-21 MED ORDER — BUPROPION HCL ER (SR) 100 MG PO TB12: 100.0000 mg | ORAL_TABLET | Freq: Every morning | ORAL | 0 refills | Status: DC

## 2021-11-21 MED ORDER — CITALOPRAM HYDROBROMIDE 20 MG PO TABS: 10.0000 mg | ORAL_TABLET | Freq: Every day | ORAL | 0 refills | Status: DC

## 2021-11-21 MED ORDER — DULOXETINE HCL 30 MG PO CPEP: 30.0000 mg | ORAL_CAPSULE | Freq: Every day | ORAL | 0 refills | Status: DC

## 2021-11-21 NOTE — Progress Notes (Signed)
? ?Subjective: ?CC: Chronic knee pain ?PCP: Janora Norlander, DO ?[id number] Valerie Baker is a 66 y.o. female presenting to clinic today for: ? ?1.  Chronic knee pain ?Patient is currently treated with Percocet 7.5 mg 3 times daily by her specialist for chronic knee pain.  She was on the twice daily dosing but has been advanced to 3 times daily dosing due to uncontrolled pain.  She needs a knee and hip replacement but this is being deferred for now as they have set a goal of 205 pounds before they will proceed with knee replacement.  She feels somewhat deflated by this but is going to get back into the pool to get some exercise that does not flareup her pain as much.  She plans on going back on keto diet as this is worked well for her in the past.  She declines any nutritionist referral at this time. ? ?2.  Depressive disorder ?Patient reports that her mood has gotten worse.  She thinks it may be related to pain but feels like the Celexa is ineffective now.  Her son takes Cymbalta and she is wishing to switch over to that medicine if possible. ? ? ?ROS: Per HPI ? ?Allergies  ?Allergen Reactions  ? Sulfonamide Derivatives Rash  ? ?Past Medical History:  ?Diagnosis Date  ? Adenomatous colon polyp 06/13/2006  ? Allergy   ? SINUS  ? Anxiety   ? Anxiety and depression   ? Arthritis   ? Asthma   ? Bladder incontinence   ? Blood transfusion   ? Cataract   ? Depression   ? Fibromyalgia   ? GERD (gastroesophageal reflux disease)   ? Hyperlipidemia   ? Hypertension   ? IBS (irritable bowel syndrome)   ? Kidney stones   ? Migraines   ? Obesity   ? Osteopenia   ? Post-operative nausea and vomiting   ? ? ?Current Outpatient Medications:  ?  albuterol (PROVENTIL HFA;VENTOLIN HFA) 108 (90 Base) MCG/ACT inhaler, Inhale 2 puffs into the lungs every 6 (six) hours as needed for wheezing or shortness of breath., Disp: 1 Inhaler, Rfl: 0 ?  Ascorbic Acid (VITAMIN C PO), Take by mouth., Disp: , Rfl:  ?  baclofen (LIORESAL) 10 MG  tablet, TAKE 1/2 TO 1 TABLET BY  MOUTH 3 TIMES DAILY AS  NEEDED FOR MUSCLE SPASMS, Disp: 270 tablet, Rfl: 2 ?  Calcium Carbonate-Vitamin D 600-400 MG-UNIT tablet, Take 1 tablet by mouth 2 (two) times daily., Disp: , Rfl:  ?  Cholecalciferol (VITAMIN D3) 2000 UNITS TABS, Take 1 tablet by mouth daily., Disp: , Rfl:  ?  citalopram (CELEXA) 20 MG tablet, TAKE 1 TABLET BY MOUTH  DAILY, Disp: 90 tablet, Rfl: 3 ?  diclofenac Sodium (VOLTAREN) 1 % GEL, Apply 4 g topically 4 (four) times daily., Disp: 300 g, Rfl: 5 ?  fluticasone (FLONASE) 50 MCG/ACT nasal spray, Place 2 sprays into both nostrils daily., Disp: 16 g, Rfl: 6 ?  gabapentin (NEURONTIN) 400 MG capsule, TAKE 2 CAPSULES BY MOUTH 3  TIMES DAILY, Disp: 540 capsule, Rfl: 3 ?  L-Lysine 1000 MG TABS, Take by mouth., Disp: , Rfl:  ?  loratadine (CLARITIN) 10 MG tablet, Take 10 mg by mouth daily as needed for allergies., Disp: , Rfl:  ?  meloxicam (MOBIC) 15 MG tablet, Take 1 tablet (15 mg total) by mouth daily., Disp: 90 tablet, Rfl: 3 ?  Multiple Vitamin (MULTIVITAMIN) tablet, Take 1 tablet by mouth daily., Disp: , Rfl:  ?  Omega-3 Fatty Acids (FISH OIL) 1000 MG CAPS, Take by mouth., Disp: , Rfl:  ?  omeprazole (PRILOSEC) 40 MG capsule, Take 1 capsule (40 mg total) by mouth 2 (two) times daily., Disp: 180 capsule, Rfl: 3 ?  oxybutynin (DITROPAN) 5 MG tablet, Take 1 tablet (5 mg total) by mouth 2 (two) times daily., Disp: 180 tablet, Rfl: 3 ?  oxyCODONE-acetaminophen (PERCOCET) 7.5-325 MG tablet, Take 1 tablet by mouth every 8 (eight) hours as needed for severe pain., Disp: 60 tablet, Rfl: 0 ?  traZODone (DESYREL) 100 MG tablet, Take 1 tablet (100 mg total) by mouth at bedtime. As needed for sleep/ insomnia, Disp: 90 tablet, Rfl: 3 ?  VITAMIN E PO, Take by mouth., Disp: , Rfl:  ?  oxyCODONE-acetaminophen (PERCOCET) 7.5-325 MG tablet, Take 1 tablet by mouth every 8 (eight) hours as needed for severe pain. (Patient not taking: Reported on 11/21/2021), Disp: 60 tablet, Rfl:  0 ?  oxyCODONE-acetaminophen (PERCOCET) 7.5-325 MG tablet, Take 1 tablet by mouth every 8 (eight) hours as needed for severe pain. (Patient not taking: Reported on 11/21/2021), Disp: 60 tablet, Rfl: 0 ?Social History  ? ?Socioeconomic History  ? Marital status: Married  ?  Spouse name: Not on file  ? Number of children: 3  ? Years of education: Not on file  ? Highest education level: Not on file  ?Occupational History  ? Occupation: Disabled  ?Tobacco Use  ? Smoking status: Former  ?  Years: 5.00  ?  Types: Cigarettes  ?  Quit date: 11/02/2005  ?  Years since quitting: 16.0  ? Smokeless tobacco: Never  ?Vaping Use  ? Vaping Use: Never used  ?Substance and Sexual Activity  ? Alcohol use: No  ? Drug use: No  ? Sexual activity: Yes  ?Other Topics Concern  ? Not on file  ?Social History Narrative  ? Not on file  ? ?Social Determinants of Health  ? ?Financial Resource Strain: Not on file  ?Food Insecurity: Not on file  ?Transportation Needs: Not on file  ?Physical Activity: Not on file  ?Stress: Not on file  ?Social Connections: Not on file  ?Intimate Partner Violence: Not on file  ? ?Family History  ?Problem Relation Age of Onset  ? Heart disease Mother   ? Diabetes Mother   ? Melanoma Mother   ?     nose  ? Stroke Father   ? Colon polyps Sister   ? Hypertension Sister   ? Skin cancer Sister   ? Diabetes Brother   ? Cirrhosis Brother   ? Hypertension Brother   ? Colon polyps Brother   ? Hypertension Brother   ? Stomach cancer Other   ? Rectal cancer Other   ? Esophageal cancer Other   ? Colon cancer Other   ? ? ?Objective: ?Office vital signs reviewed. ?BP (!) 177/84   Pulse 62   Temp 98 ?F (36.7 ?C)   Ht '5\' 4"'$  (1.626 m)   Wt 229 lb 6.4 oz (104.1 kg)   SpO2 95%   BMI 39.38 kg/m?  ? ?Physical Examination:  ?General: Awake, alert, morbidly obese, No acute distress ?HEENT: Sclera white ?Cardio: regular rate and rhythm ?Pulm: Normal work of breathing on room air.  No wheezes ?MSK: Antalgic gait.  Utilizing cane for  ambulation ?Psych: Stressed but very pleasant and interactive ? ? ?  11/21/2021  ?  8:39 AM 08/21/2021  ?  9:51 AM 04/19/2021  ? 10:28 AM  ?Depression screen PHQ 2/9  ?  Decreased Interest 0 0 0  ?Down, Depressed, Hopeless 0 0 0  ?PHQ - 2 Score 0 0 0  ? ? ?  11/21/2021  ?  8:39 AM 08/21/2021  ?  9:51 AM 04/19/2021  ? 10:28 AM 12/26/2020  ?  9:07 AM  ?GAD 7 : Generalized Anxiety Score  ?Nervous, Anxious, on Edge 0 0 0 0  ?Control/stop worrying 0 0 0 0  ?Worry too much - different things 0 0 0 0  ?Trouble relaxing 0 0 0 0  ?Restless 0 0 0 0  ?Easily annoyed or irritable 0 0 0 0  ?Afraid - awful might happen 0 0 0 0  ?Total GAD 7 Score 0 0 0 0  ?Anxiety Difficulty Not difficult at all Not difficult at all Not difficult at all Not difficult at all  ? ?Assessment/ Plan: ?66 y.o. female  ? ?Bilateral chronic knee pain ? ?Chronic, continuous use of opioids ? ?Depression, recurrent ([place]) - Plan: DULoxetine (CYMBALTA) 30 MG capsule, citalopram (CELEXA) 20 MG tablet, buPROPion ER (WELLBUTRIN SR) 100 MG 12 hr tablet ? ?Elevated blood pressure reading in office without diagnosis of hypertension ? ?Opioids currently being managed by her specialist given that she needs higher dosing right now.  I think this is reasonable and I will be glad to take over if she wants to resume lower dosing in the future. ? ?For her depression and chronic pain I am adding Cymbalta.  We will taper her off of the Celexa and she will take 1/2 tablet daily for the next 3 weeks and then she may discontinue.  We will have a follow-up visit in 4 weeks and we can advance his Cymbalta at that time.  I have added Wellbutrin for both depression and to help with appetite suppressant as she does need to lose about 25 pounds before they will proceed with a knee replacement.  Encouraged carbohydrate restriction and increased physical exercise.  She will start at the pool soon. ? ?Blood pressure again elevated in office but when she came in for a nurse blood pressure check it  was within acceptable range.  I do think that her blood pressure is reflective of uncontrolled pain currently.  We will continue monitor closely and she will monitor her blood pressures at home as well for me.  We

## 2021-11-21 NOTE — Patient Instructions (Addendum)
Your citalopram in half.  You will only be taking 1/2 tablet daily for the next 3 to 4 weeks then you will stop. ?You will start Cymbalta (duloxetine) 1 capsule daily along with the reduced dose of your citalopram ?We are also starting bupropion (Wellbutrin).  Take 1 tablet every morning.  This should help with both mood and with some appetite suppression. ? ?Serotonin Syndrome ?Serotonin is a chemical in your body (neurotransmitter) that helps to control several functions, such as: ?Brain and nerve cell function. ?Mood and emotions. ?Memory. ?Eating. ?Sleeping. ?Sexual activity. ?Stress response. ?Having too much serotonin in your body can cause serotonin syndrome. This condition can be harmful to your brain and nerve cells. This can be a life-threatening condition. ?What are the causes? ?This condition may be caused by taking medicines or drugs that increase the level of serotonin in your body, such as: ?Antidepressant medicines. ?Migraine medicines. ?Certain pain medicines. ?Certain drugs, including ecstasy, LSD, cocaine, and amphetamines. ?Over-the-counter cough or cold medicines that contain dextromethorphan. ?Certain herbal supplements, including St. John's wort, ginseng, and nutmeg. ?This condition usually occurs when you take these medicines or drugs in combination, but it can also happen with a high dose of a single medicine or drug. ?What increases the risk? ?You are more likely to develop this condition if: ?You just started taking a medicine or drug that increases the level of serotonin in the body. ?You recently increased the dose of a medicine or drug that increases the level of serotonin in the body. ?You take more than one medicine or drug that increases the level of serotonin in the body. ?What are the signs or symptoms? ?Symptoms of this condition usually start within several hours of taking a medicine or drug. Symptoms may be mild or severe. Mild symptoms include: ?Sweating. ?Restlessness or  agitation. ?Muscle twitching or stiffness. ?Rapid heart rate. ?Nausea and vomiting. ?Diarrhea. ?Headache. ?Shivering or goose bumps. ?Confusion. ?Severe symptoms include: ?Irregular heartbeat. ?Seizures. ?Loss of consciousness. ?High fever. ?How is this diagnosed? ?This condition may be diagnosed based on: ?Your medical history.  ?A physical exam. ?Your prior use of drugs and medicines. ?Blood or urine tests. These may be used to rule out other causes of your symptoms. ?How is this treated? ?The treatment for this condition depends on the severity of your symptoms. ?For mild cases, stopping the medicine or drug that caused your condition is usually all that is needed. ?For moderate to severe cases, treatment in a hospital may be needed to prevent or manage life-threatening symptoms. This may include medicines to control your symptoms, IV fluids, interventions to support your breathing, and treatments to control your body temperature. ?Follow these instructions at home: ?Medicines ? ?Take over-the-counter and prescription medicines only as told by your health care provider. This is important. ?Check with your health care provider before you start taking any new prescriptions, over-the-counter medicines, herbs, or supplements. ?Avoid combining any medicines that can cause this condition to occur. ?Lifestyle ? ?Maintain a healthy lifestyle. ?Eat a healthy diet that includes plenty of vegetables, fruits, whole grains, low-fat dairy products, and lean protein. Do not eat a lot of foods that are high in fat, added sugars, or salt. ?Get the right amount and quality of sleep. Most adults need 7-9 hours of sleep each night. ?Make time to exercise, even if it is only for short periods of time. Most adults should exercise for at least 150 minutes each week. ?Do not drink alcohol. ?Do not use illegal drugs, and  do not take medicines for reasons other than they are prescribed. ?General instructions ?Do not use any products that  contain nicotine or tobacco, such as cigarettes and e-cigarettes. If you need help quitting, ask your health care provider. ?Keep all follow-up visits as told by your health care provider. This is important. ?Contact a health care provider if: ?Your symptoms do not improve or they get worse. ?Get help right away if you: ?Have worsening confusion, severe headache, chest pain, high fever, seizures, or loss of consciousness. ?Experience serious side effects of medicine, such as swelling of your face, lips, tongue, or throat. ?Have serious thoughts about hurting yourself or others. ?These symptoms may represent a serious problem that is an emergency. Do not wait to see if the symptoms will go away. Get medical help right away. Call your local emergency services (911 in the [place] Do not drive yourself to the hospital. ?If you ever feel like you may hurt yourself or others, or have thoughts about taking your own life, get help right away. You can go to your nearest emergency department or call: ?Your local emergency services (911 in the [place] ??A suicide crisis helpline, such as the [facility] at (878) 111-0827. This is open 24 hours a day. ?Summary ?Serotonin is a brain chemical that helps to regulate the nervous system. High levels of serotonin in the body can cause serotonin syndrome, which is a very dangerous condition. ?This condition may be caused by taking medicines or drugs that increase the level of serotonin in your body. ?Treatment depends on the severity of your symptoms. For mild cases, stopping the medicine or drug that caused your condition is usually all that is needed. ?Check with your health care provider before you start taking any new prescriptions, over-the-counter medicines, herbs, or supplements. ?This information is not intended to replace advice given to you by your health care provider. Make sure you discuss any questions you have with your health care  provider. ?Document Revised: 09/06/2017 Document Reviewed: 09/06/2017 ?Elsevier Patient Education ? [place]. ? ?

## 2021-11-22 DIAGNOSIS — M1612 Unilateral primary osteoarthritis, left hip: Secondary | ICD-10-CM | POA: Diagnosis not present

## 2021-11-23 ENCOUNTER — Ambulatory Visit: Payer: Medicare Other

## 2021-11-28 ENCOUNTER — Other Ambulatory Visit: Payer: Self-pay | Admitting: Family Medicine

## 2021-11-28 DIAGNOSIS — M4325 Fusion of spine, thoracolumbar region: Secondary | ICD-10-CM | POA: Diagnosis not present

## 2021-11-28 DIAGNOSIS — G894 Chronic pain syndrome: Secondary | ICD-10-CM | POA: Diagnosis not present

## 2021-11-28 DIAGNOSIS — M545 Low back pain, unspecified: Secondary | ICD-10-CM | POA: Diagnosis not present

## 2021-11-28 DIAGNOSIS — M1612 Unilateral primary osteoarthritis, left hip: Secondary | ICD-10-CM | POA: Diagnosis not present

## 2021-11-28 DIAGNOSIS — Z79899 Other long term (current) drug therapy: Secondary | ICD-10-CM | POA: Diagnosis not present

## 2021-11-28 DIAGNOSIS — Z79891 Long term (current) use of opiate analgesic: Secondary | ICD-10-CM | POA: Diagnosis not present

## 2021-11-28 DIAGNOSIS — M7918 Myalgia, other site: Secondary | ICD-10-CM | POA: Diagnosis not present

## 2021-11-28 DIAGNOSIS — M532X6 Spinal instabilities, lumbar region: Secondary | ICD-10-CM | POA: Diagnosis not present

## 2021-11-29 ENCOUNTER — Ambulatory Visit
Admission: RE | Admit: 2021-11-29 | Discharge: 2021-11-29 | Disposition: A | Discharge status: Home / Self Care | Payer: Medicare Other | Source: Ambulatory Visit | Attending: Family Medicine | Admitting: Family Medicine

## 2021-11-29 ENCOUNTER — Ambulatory Visit: Payer: Medicare Other

## 2021-11-29 DIAGNOSIS — Z1231 Encounter for screening mammogram for malignant neoplasm of breast: Secondary | ICD-10-CM

## 2021-12-12 DIAGNOSIS — H10013 Acute follicular conjunctivitis, bilateral: Secondary | ICD-10-CM | POA: Diagnosis not present

## 2021-12-12 DIAGNOSIS — T1512XA Foreign body in conjunctival sac, left eye, initial encounter: Secondary | ICD-10-CM | POA: Diagnosis not present

## 2021-12-14 DIAGNOSIS — M25552 Pain in left hip: Secondary | ICD-10-CM | POA: Insufficient documentation

## 2021-12-14 DIAGNOSIS — M7062 Trochanteric bursitis, left hip: Secondary | ICD-10-CM | POA: Diagnosis not present

## 2021-12-14 DIAGNOSIS — M1711 Unilateral primary osteoarthritis, right knee: Secondary | ICD-10-CM | POA: Diagnosis not present

## 2021-12-25 ENCOUNTER — Ambulatory Visit (INDEPENDENT_AMBULATORY_CARE_PROVIDER_SITE_OTHER): Payer: Medicare Other | Admitting: Family Medicine

## 2021-12-25 DIAGNOSIS — F339 Major depressive disorder, recurrent, unspecified: Secondary | ICD-10-CM | POA: Diagnosis not present

## 2021-12-25 DIAGNOSIS — B9689 Other specified bacterial agents as the cause of diseases classified elsewhere: Secondary | ICD-10-CM | POA: Diagnosis not present

## 2021-12-25 DIAGNOSIS — G894 Chronic pain syndrome: Secondary | ICD-10-CM | POA: Diagnosis not present

## 2021-12-25 DIAGNOSIS — J208 Acute bronchitis due to other specified organisms: Secondary | ICD-10-CM

## 2021-12-25 MED ORDER — BUPROPION HCL ER (SR) 100 MG PO TB12: 100.0000 mg | ORAL_TABLET | Freq: Every morning | ORAL | 3 refills | Status: DC

## 2021-12-25 MED ORDER — HYDROCODONE BIT-HOMATROP MBR 5-1.5 MG/5ML PO SOLN: 5.0000 mL | Freq: Four times a day (QID) | ORAL | 0 refills | Status: DC | PRN

## 2021-12-25 MED ORDER — DULOXETINE HCL 60 MG PO CPEP: 60.0000 mg | ORAL_CAPSULE | Freq: Every day | ORAL | 0 refills | Status: DC

## 2021-12-25 MED ORDER — DOXYCYCLINE HYCLATE 100 MG PO TABS: 100.0000 mg | ORAL_TABLET | Freq: Two times a day (BID) | ORAL | 0 refills | Status: AC

## 2021-12-25 MED ORDER — DULOXETINE HCL 60 MG PO CPEP: 60.0000 mg | ORAL_CAPSULE | Freq: Every day | ORAL | 3 refills | Status: DC

## 2021-12-25 NOTE — Progress Notes (Signed)
Telephone visit ? ?Subjective: ?CC: Follow-up depression ?PCP: Janora Norlander, DO ?ZOX:Valerie Baker is a 66 y.o. female calls for telephone consult today. Patient provides verbal consent for consult held via phone. ? ?Due to COVID-19 pandemic this visit was conducted virtually. This visit type was conducted due to national recommendations for restrictions regarding the COVID-19 Pandemic (e.g. social distancing, sheltering in place) in an effort to limit this patient's exposure and mitigate transmission in our community. All issues noted in this document were discussed and addressed.  A physical exam was not performed with this format.  ? ?Location of patient: home ?Location of provider: [facility] ?Others present for call: none ? ?1. Cough ?Patient reports onset of cough about 1 week ago.  She has a cough medication at home (hydromet 5-1.5) that has been helping.  Holding Percocet while taking.  She reports sputum is brownish.  No shortness of breath or wheezing.  No fevers.  No watery eyes, sinusitis.  Former smoker, quit 20 years ago. ? ?2. Depression ?No changes in chronic pain yet but has noticed good change with depressive symptoms.  Tolerating Cymbalta and Wellbutrin without difficulty.  She has gotten down to 217lb.  She's pleased about this.  Continues to use Trazodone at bedtime.  She now can go through with her surgery.  Her pain medications are managed by her orthopedist currently. ? ? ?ROS: Per HPI ? ?Allergies  ?Allergen Reactions  ? Sulfonamide Derivatives Rash  ? ?Past Medical History:  ?Diagnosis Date  ? Adenomatous colon polyp 06/13/2006  ? Allergy   ? SINUS  ? Anxiety   ? Anxiety and depression   ? Arthritis   ? Asthma   ? Bladder incontinence   ? Blood transfusion   ? Cataract   ? Depression   ? Fibromyalgia   ? GERD (gastroesophageal reflux disease)   ? Hyperlipidemia   ? Hypertension   ? IBS (irritable bowel syndrome)   ? Kidney stones   ? Migraines   ? Obesity   ? Osteopenia   ?  Post-operative nausea and vomiting   ? ? ?Current Outpatient Medications:  ?  albuterol (PROVENTIL HFA;VENTOLIN HFA) 108 (90 Base) MCG/ACT inhaler, Inhale 2 puffs into the lungs every 6 (six) hours as needed for wheezing or shortness of breath., Disp: 1 Inhaler, Rfl: 0 ?  Ascorbic Acid (VITAMIN C PO), Take by mouth., Disp: , Rfl:  ?  baclofen (LIORESAL) 10 MG tablet, TAKE 1/2 TO 1 TABLET BY  MOUTH 3 TIMES DAILY AS  NEEDED FOR MUSCLE SPASMS, Disp: 270 tablet, Rfl: 3 ?  buPROPion ER (WELLBUTRIN SR) 100 MG 12 hr tablet, Take 1 tablet (100 mg total) by mouth in the morning., Disp: 90 tablet, Rfl: 0 ?  Calcium Carbonate-Vitamin D 600-400 MG-UNIT tablet, Take 1 tablet by mouth 2 (two) times daily., Disp: , Rfl:  ?  Cholecalciferol (VITAMIN D3) 2000 UNITS TABS, Take 1 tablet by mouth daily., Disp: , Rfl:  ?  citalopram (CELEXA) 20 MG tablet, Take 0.5 tablets (10 mg total) by mouth daily for 28 days., Disp: 14 tablet, Rfl: 0 ?  diclofenac Sodium (VOLTAREN) 1 % GEL, Apply 4 g topically 4 (four) times daily., Disp: 300 g, Rfl: 5 ?  DULoxetine (CYMBALTA) 30 MG capsule, Take 1 capsule (30 mg total) by mouth daily. Weaning Celexa., Disp: 30 capsule, Rfl: 0 ?  fluticasone (FLONASE) 50 MCG/ACT nasal spray, Place 2 sprays into both nostrils daily., Disp: 16 g, Rfl: 6 ?  gabapentin (NEURONTIN)  400 MG capsule, TAKE 2 CAPSULES BY MOUTH 3  TIMES DAILY, Disp: 540 capsule, Rfl: 3 ?  L-Lysine 1000 MG TABS, Take by mouth., Disp: , Rfl:  ?  loratadine (CLARITIN) 10 MG tablet, Take 10 mg by mouth daily as needed for allergies., Disp: , Rfl:  ?  meloxicam (MOBIC) 15 MG tablet, Take 1 tablet (15 mg total) by mouth daily., Disp: 90 tablet, Rfl: 3 ?  Multiple Vitamin (MULTIVITAMIN) tablet, Take 1 tablet by mouth daily., Disp: , Rfl:  ?  Omega-3 Fatty Acids (FISH OIL) 1000 MG CAPS, Take by mouth., Disp: , Rfl:  ?  omeprazole (PRILOSEC) 40 MG capsule, Take 1 capsule (40 mg total) by mouth 2 (two) times daily., Disp: 180 capsule, Rfl: 3 ?   oxybutynin (DITROPAN) 5 MG tablet, Take 1 tablet (5 mg total) by mouth 2 (two) times daily., Disp: 180 tablet, Rfl: 3 ?  oxyCODONE-acetaminophen (PERCOCET) 7.5-325 MG tablet, Take 1 tablet by mouth every 8 (eight) hours as needed for severe pain., Disp: 60 tablet, Rfl: 0 ?  oxyCODONE-acetaminophen (PERCOCET) 7.5-325 MG tablet, Take 1 tablet by mouth every 8 (eight) hours as needed for severe pain. (Patient not taking: Reported on 11/21/2021), Disp: 60 tablet, Rfl: 0 ?  oxyCODONE-acetaminophen (PERCOCET) 7.5-325 MG tablet, Take 1 tablet by mouth every 8 (eight) hours as needed for severe pain. (Patient not taking: Reported on 11/21/2021), Disp: 60 tablet, Rfl: 0 ?  traZODone (DESYREL) 100 MG tablet, Take 1 tablet (100 mg total) by mouth at bedtime. As needed for sleep/ insomnia, Disp: 90 tablet, Rfl: 3 ?  VITAMIN E PO, Take by mouth., Disp: , Rfl:  ? ?[facility] Visit from 12/25/2021 in [facility]  ?PHQ-2 Total Score 0  ? ?  ? ? ?  12/25/2021  ? 10:42 AM 11/21/2021  ?  8:39 AM 08/21/2021  ?  9:51 AM 04/19/2021  ? 10:28 AM  ?GAD 7 : Generalized Anxiety Score  ?Nervous, Anxious, on Edge 0 0 0 0  ?Control/stop worrying 0 0 0 0  ?Worry too much - different things 0 0 0 0  ?Trouble relaxing 0 0 0 0  ?Restless 0 0 0 0  ?Easily annoyed or irritable 0 0 0 0  ?Afraid - awful might happen 0 0 0 0  ?Total GAD 7 Score 0 0 0 0  ?Anxiety Difficulty Not difficult at all Not difficult at all Not difficult at all Not difficult at all  ? ?Assessment/ Plan: ?66 y.o. female  ? ?Acute bacterial bronchitis - Plan: HYDROcodone bit-homatropine (HYDROMET) 5-1.5 MG/5ML syrup, doxycycline (VIBRA-TABS) 100 MG tablet ? ?Depression, recurrent ([place]) - Plan: DULoxetine (CYMBALTA) 60 MG capsule, buPROPion ER (WELLBUTRIN SR) 100 MG 12 hr tablet, DULoxetine (CYMBALTA) 60 MG capsule ? ?Chronic pain syndrome - Plan: DULoxetine (CYMBALTA) 60 MG capsule ? ?We will add her as acute bacterial bronchitis given the reports of brown  sputum.  Doxycycline has been prescribed.  Hydromet renewed.  Continue to hold Percocet while taking.  We discussed red flag signs and symptoms warranting further evaluation in office.  She voiced good understanding will follow-up as needed ? ?Depression seems to have gotten in a really good place with the Cymbalta.  We will advance the dose in efforts to improve some of the pain syndrome.  She may continue Wellbutrin at current dose.  Renewals have been sent as well as a 30-day supply to local pharmacy ? ?She will follow-up as needed going forward ? ?Start time:  10:35am ?End time: 10:46am ? ?Total time spent on patient care (including telephone call/ virtual visit): 11 minutes ? ?Janora Norlander, DO ?[place] ?[phone] ? ? ?

## 2021-12-28 DIAGNOSIS — L570 Actinic keratosis: Secondary | ICD-10-CM | POA: Diagnosis not present

## 2021-12-28 DIAGNOSIS — L565 Disseminated superficial actinic porokeratosis (DSAP): Secondary | ICD-10-CM | POA: Diagnosis not present

## 2021-12-28 DIAGNOSIS — D1801 Hemangioma of skin and subcutaneous tissue: Secondary | ICD-10-CM | POA: Diagnosis not present

## 2021-12-28 DIAGNOSIS — D225 Melanocytic nevi of trunk: Secondary | ICD-10-CM | POA: Diagnosis not present

## 2022-01-07 ENCOUNTER — Other Ambulatory Visit: Payer: Self-pay | Admitting: Family Medicine

## 2022-01-07 DIAGNOSIS — F339 Major depressive disorder, recurrent, unspecified: Secondary | ICD-10-CM

## 2022-01-23 DIAGNOSIS — M545 Low back pain, unspecified: Secondary | ICD-10-CM | POA: Diagnosis not present

## 2022-01-23 DIAGNOSIS — M542 Cervicalgia: Secondary | ICD-10-CM | POA: Diagnosis not present

## 2022-01-23 DIAGNOSIS — M7918 Myalgia, other site: Secondary | ICD-10-CM | POA: Diagnosis not present

## 2022-01-23 DIAGNOSIS — M1612 Unilateral primary osteoarthritis, left hip: Secondary | ICD-10-CM | POA: Diagnosis not present

## 2022-01-23 DIAGNOSIS — M4325 Fusion of spine, thoracolumbar region: Secondary | ICD-10-CM | POA: Diagnosis not present

## 2022-03-14 ENCOUNTER — Ambulatory Visit (INDEPENDENT_AMBULATORY_CARE_PROVIDER_SITE_OTHER): Payer: Medicare Other | Admitting: Family Medicine

## 2022-03-14 ENCOUNTER — Telehealth: Payer: Self-pay | Admitting: Family Medicine

## 2022-03-14 ENCOUNTER — Encounter: Payer: Self-pay | Admitting: Family Medicine

## 2022-03-14 VITALS — BP 135/78 | HR 67 | Temp 97.8°F | Ht 64.0 in | Wt 208.0 lb

## 2022-03-14 DIAGNOSIS — N62 Hypertrophy of breast: Secondary | ICD-10-CM

## 2022-03-14 DIAGNOSIS — I10 Essential (primary) hypertension: Secondary | ICD-10-CM | POA: Diagnosis not present

## 2022-03-14 DIAGNOSIS — M25562 Pain in left knee: Secondary | ICD-10-CM

## 2022-03-14 DIAGNOSIS — F1190 Opioid use, unspecified, uncomplicated: Secondary | ICD-10-CM

## 2022-03-14 DIAGNOSIS — M25561 Pain in right knee: Secondary | ICD-10-CM | POA: Diagnosis not present

## 2022-03-14 DIAGNOSIS — G8929 Other chronic pain: Secondary | ICD-10-CM | POA: Diagnosis not present

## 2022-03-14 DIAGNOSIS — Z01818 Encounter for other preprocedural examination: Secondary | ICD-10-CM

## 2022-03-14 LAB — URINALYSIS
Bilirubin, UA: NEGATIVE
Glucose, UA: NEGATIVE
Ketones, UA: NEGATIVE
Leukocytes,UA: NEGATIVE
Nitrite, UA: NEGATIVE
Protein,UA: NEGATIVE
RBC, UA: NEGATIVE
Specific Gravity, UA: 1.015 (ref 1.005–1.030)
Urobilinogen, Ur: 0.2 mg/dL (ref 0.2–1.0)
pH, UA: 8.5 — ABNORMAL HIGH (ref 5.0–7.5)

## 2022-03-14 LAB — BAYER DCA HB A1C WAIVED: HB A1C (BAYER DCA - WAIVED): 5.1 % (ref 4.8–5.6)

## 2022-03-14 LAB — COAGUCHEK XS/INR WAIVED
INR: 1 (ref 0.9–1.1)
Prothrombin Time: 12.1 s

## 2022-03-14 MED ORDER — OXYCODONE-ACETAMINOPHEN 7.5-325 MG PO TABS: 1.0000 | ORAL_TABLET | Freq: Two times a day (BID) | ORAL | 0 refills | Status: DC | PRN

## 2022-03-14 NOTE — Addendum Note (Signed)
Addended by: Janora Norlander on: 03/14/2022 10:42 AM   Modules accepted: Orders

## 2022-03-14 NOTE — Telephone Encounter (Signed)
Talked to Dr. Darnell Level

## 2022-03-14 NOTE — Progress Notes (Signed)
Pt is a 66 y.o. female who is here for preoperative clearance for total knee arthroplasty with Dr. Almyra Deforest.  This has been scheduled for September.  She denies any chest pain, shortness of breath, difficulties with swallowing or bending her neck.  She continues to have quite a bit of knee pain and is status post corticosteroid injection but again still having quite a bit of discomfort with ambulation  Additionally notes some shoulder discomfort and upper back discomfort secondary to large breast.  Sometimes the breast way on her chest and she tries to lay in her side to sleep.  She has recurrent yeast infections that occur under the breast.  She would like to get a reduction if it would be affordable but she has not pursued this due to concerns that she could not afford it  1) High Risk Cardiac Conditions  1) Recent MI - No.  2) Decompensated Heart Failure - No.  3) Unstable angina - No.  4) Symptomatic arrythmia - No.  5) Sx Valvular Disease - No.  2) Intermediate Risk Factors - DM, CKD, CVA, CHF, CAD - No.  2) Functional Status - > 4 mets (Walk, run, climb stairs) Yes.  Marland Kitchen    3) Surgery Specific Risk -  Intermediate (  Orthopaedic )             4) Further Noninvasive evaluation -   1) EKG - Yes.        2) Echo - No.      3) Stress Testing - Active Cardiac Disease - No.  5) Need for medical therapy - Beta Blocker, Statins indicated ? No.  PE: There were no vitals filed for this visit. Physical Examination: General appearance - alert, well appearing, and in no distress and obese Mental status - alert, oriented to person, place, and time Eyes - pupils equal and reactive, extraocular eye movements intact Mouth - mucous membranes moist, pharynx normal without lesions Neck - supple, no significant adenopathy, able to extend fully Chest - clear to auscultation, no wheezes, rales or rhonchi, symmetric air entry, macromastia present with indentation on bilateral shoulders Heart - normal  rate, regular rhythm, normal S1, S2, no murmurs, rubs, clicks or gallops Musculoskeletal -ambulating with use of cane.  Gait is antalgic.  She has prominent kyphosis of the thoracic spine   Preoperative examination - Plan: CMP14+EGFR, CBC, CoaguChek XS/INR Waived, Urinalysis, Bayer DCA Hb A1c Waived, EKG 12-Lead  Morbid obesity (HCC) - Plan: CMP14+EGFR, Bayer DCA Hb A1c Waived, EKG 12-Lead  Essential hypertension - Plan: CMP14+EGFR, Urinalysis, EKG 12-Lead  Bilateral chronic knee pain - Plan: oxyCODONE-acetaminophen (PERCOCET) 7.5-325 MG tablet, oxyCODONE-acetaminophen (PERCOCET) 7.5-325 MG tablet, oxyCODONE-acetaminophen (PERCOCET) 7.5-325 MG tablet  Chronic, continuous use of opioids - Plan: oxyCODONE-acetaminophen (PERCOCET) 7.5-325 MG tablet, oxyCODONE-acetaminophen (PERCOCET) 7.5-325 MG tablet, oxyCODONE-acetaminophen (PERCOCET) 7.5-325 MG tablet  Macromastia - Plan: Ambulatory referral to Plastic Surgery  Preop labs have been obtained.  EKG without acute ischemia.  Unchanged from previous EKG in September  I have independently evaluated patient.  EVONY REZEK is a 66 y.o. female who is low risk for a intermediate risk surgery.  There are/ are modifiable risk factors (obesity). Sahalie L Winthrop's RCRI calculation for MACE is: 0.    Referral to plastic surgery for macromastia placed.  She has obvious kyphosis of the thoracic spine, indentation on bilateral shoulders and has a history of recurrent intertrigo underneath the breast.  Aquil Duhe M. Lajuana Ripple, [facility]

## 2022-03-14 NOTE — Patient Instructions (Signed)
OK for Dr Almyra Deforest to write you pain medications after surgery.

## 2022-03-15 ENCOUNTER — Ambulatory Visit: Payer: Medicare Other | Admitting: Plastic Surgery

## 2022-03-15 ENCOUNTER — Encounter: Payer: Self-pay | Admitting: Plastic Surgery

## 2022-03-15 VITALS — BP 137/79 | HR 69 | Ht 60.0 in | Wt 208.0 lb

## 2022-03-15 DIAGNOSIS — N62 Hypertrophy of breast: Secondary | ICD-10-CM

## 2022-03-15 DIAGNOSIS — M546 Pain in thoracic spine: Secondary | ICD-10-CM

## 2022-03-15 DIAGNOSIS — G8929 Other chronic pain: Secondary | ICD-10-CM

## 2022-03-15 DIAGNOSIS — M542 Cervicalgia: Secondary | ICD-10-CM | POA: Insufficient documentation

## 2022-03-15 DIAGNOSIS — Z6839 Body mass index (BMI) 39.0-39.9, adult: Secondary | ICD-10-CM

## 2022-03-15 DIAGNOSIS — M5137 Other intervertebral disc degeneration, lumbosacral region: Secondary | ICD-10-CM

## 2022-03-15 DIAGNOSIS — M549 Dorsalgia, unspecified: Secondary | ICD-10-CM | POA: Insufficient documentation

## 2022-03-15 DIAGNOSIS — R21 Rash and other nonspecific skin eruption: Secondary | ICD-10-CM | POA: Diagnosis not present

## 2022-03-15 LAB — CBC
Hematocrit: 38.2 % (ref 34.0–46.6)
Hemoglobin: 12.9 g/dL (ref 11.1–15.9)
MCH: 31.7 pg (ref 26.6–33.0)
MCHC: 33.8 g/dL (ref 31.5–35.7)
MCV: 94 fL (ref 79–97)
Platelets: 198 10*3/uL (ref 150–450)
RBC: 4.07 x10E6/uL (ref 3.77–5.28)
RDW: 12.3 % (ref 11.7–15.4)
WBC: 5.6 10*3/uL (ref 3.4–10.8)

## 2022-03-15 LAB — CMP14+EGFR
ALT: 23 IU/L (ref 0–32)
AST: 24 IU/L (ref 0–40)
Albumin/Globulin Ratio: 1.8 (ref 1.2–2.2)
Albumin: 4.2 g/dL (ref 3.9–4.9)
Alkaline Phosphatase: 76 IU/L (ref 44–121)
BUN/Creatinine Ratio: 16 (ref 12–28)
BUN: 13 mg/dL (ref 8–27)
Bilirubin Total: 0.3 mg/dL (ref 0.0–1.2)
CO2: 27 mmol/L (ref 20–29)
Calcium: 9.6 mg/dL (ref 8.7–10.3)
Chloride: 104 mmol/L (ref 96–106)
Creatinine, Ser: 0.8 mg/dL (ref 0.57–1.00)
Globulin, Total: 2.4 g/dL (ref 1.5–4.5)
Glucose: 87 mg/dL (ref 70–99)
Potassium: 5 mmol/L (ref 3.5–5.2)
Sodium: 143 mmol/L (ref 134–144)
Total Protein: 6.6 g/dL (ref 6.0–8.5)
eGFR: 82 mL/min/{1.73_m2} (ref >59)

## 2022-03-15 NOTE — Progress Notes (Signed)
Patient ID: Valerie Baker, female    DOB: 12/12/1955, 65 y.o.   MRN: [id number]   Chief Complaint  Patient presents with   Consult   Breast Problem    Mammary Hyperplasia: The patient is a 66 y.o. female with a history of mammary hyperplasia for several years.  She has extremely large breasts causing symptoms that include the following: Back pain in the upper and lower back, including neck pain. She pulls or pins her bra straps to provide better lift and relief of the pressure and pain. She notices relief by holding her breast up manually.  Her shoulder straps cause grooves and pain and pressure that requires padding for relief. Pain medication is sometimes required with motrin and tylenol.  Activities that are hindered by enlarged breasts include: exercise and running.  She has tried supportive clothing as well as fitted bras without improvement.  Her breasts are extremely large and fairly symmetric.  She has hyperpigmentation of the inframammary area on both sides.  The sternal to nipple distance on the right is 38 cm and the left is 38 cm.  The IMF distance is 14 cm.  She is 5 feet 1 inches tall and weighs 204 pounds.  The BMI = 39.3 kg/m.  Preoperative bra size = E cup. She will be happy with anything smaller  The estimated excess breast tissue to be removed at the time of surgery = 600 -650 grams on the left and 600 -650 grams on the right.  Mammogram history: 11/2021 negative.  Family history of breast cancer:  no.  Tobacco use:  quit 15 years ago.   The patient expresses the desire to pursue surgical intervention. She has had several spine surgeries.      Review of Systems  Constitutional: Negative.   Eyes: Negative.   Respiratory: Negative.  Negative for chest tightness.   Cardiovascular: Negative.   Gastrointestinal: Negative.   Endocrine: Negative.   Genitourinary: Negative.   Musculoskeletal:  Positive for back pain and neck pain.  Skin:  Positive for rash.   Hematological: Negative.   Psychiatric/Behavioral: Negative.      Past Medical History:  Diagnosis Date   Adenomatous colon polyp 06/13/2006   Allergy    SINUS   Anxiety    Anxiety and depression    Arthritis    Asthma    Bladder incontinence    Blood transfusion    Cataract    Depression    Fibromyalgia    GERD (gastroesophageal reflux disease)    Hyperlipidemia    Hypertension    IBS (irritable bowel syndrome)    Kidney stones    Migraines    Obesity    Osteopenia    Post-operative nausea and vomiting     Past Surgical History:  Procedure Laterality Date   ABDOMINAL HYSTERECTOMY     APPENDECTOMY     BACK SURGERY  2000   BREAST BIOPSY Bilateral 11/26/2017   FIBROCYSTIC CHANGES WITH CALCIFICATIONS    COLONOSCOPY  06/12/2011   [facility] Plan, 03/17/21 MS   DEBRIDEMENT TENNIS ELBOW     ELBOW SURGERY  2003   EYE SURGERY     FOOT SURGERY  1994   right   LUMBAR FUSION  2007   LUMBAR FUSION  2002,2003,2007,2008   NASAL SINUS SURGERY  7121   NISSEN FUNDOPLICATION  9758   ROTATOR CUFF REPAIR  [id number]   SPINAL CORD STIMULATOR IMPLANT  2005   TOTAL KNEE ARTHROPLASTY  [phone]  Current Outpatient Medications:    albuterol (PROVENTIL HFA;VENTOLIN HFA) 108 (90 Base) MCG/ACT inhaler, Inhale 2 puffs into the lungs every 6 (six) hours as needed for wheezing or shortness of breath., Disp: 1 Inhaler, Rfl: 0   Ascorbic Acid (VITAMIN C PO), Take by mouth., Disp: , Rfl:    baclofen (LIORESAL) 10 MG tablet, TAKE 1/2 TO 1 TABLET BY  MOUTH 3 TIMES DAILY AS  NEEDED FOR MUSCLE SPASMS, Disp: 270 tablet, Rfl: 3   buPROPion ER (WELLBUTRIN SR) 100 MG 12 hr tablet, Take 1 tablet (100 mg total) by mouth in the morning., Disp: 90 tablet, Rfl: 3   Calcium Carbonate-Vitamin D 600-400 MG-UNIT tablet, Take 1 tablet by mouth 2 (two) times daily., Disp: , Rfl:    Cholecalciferol (VITAMIN D3) 2000 UNITS TABS, Take 1 tablet by mouth daily., Disp: , Rfl:    diclofenac Sodium (VOLTAREN) 1 % GEL, Apply  4 g topically 4 (four) times daily., Disp: 300 g, Rfl: 5   DULoxetine (CYMBALTA) 60 MG capsule, Take 1 capsule (60 mg total) by mouth daily., Disp: 90 capsule, Rfl: 3   fluticasone (FLONASE) 50 MCG/ACT nasal spray, Place 2 sprays into both nostrils daily., Disp: 16 g, Rfl: 6   gabapentin (NEURONTIN) 400 MG capsule, TAKE 2 CAPSULES BY MOUTH 3  TIMES DAILY, Disp: 540 capsule, Rfl: 3   L-Lysine 1000 MG TABS, Take by mouth., Disp: , Rfl:    loratadine (CLARITIN) 10 MG tablet, Take 10 mg by mouth daily as needed for allergies., Disp: , Rfl:    meloxicam (MOBIC) 15 MG tablet, Take 1 tablet (15 mg total) by mouth daily., Disp: 90 tablet, Rfl: 3   Multiple Vitamin (MULTIVITAMIN) tablet, Take 1 tablet by mouth daily., Disp: , Rfl:    Omega-3 Fatty Acids (FISH OIL) 1000 MG CAPS, Take by mouth., Disp: , Rfl:    omeprazole (PRILOSEC) 40 MG capsule, Take 1 capsule (40 mg total) by mouth 2 (two) times daily., Disp: 180 capsule, Rfl: 3   oxybutynin (DITROPAN) 5 MG tablet, Take 1 tablet (5 mg total) by mouth 2 (two) times daily., Disp: 180 tablet, Rfl: 3   [START ON 03/21/2022] oxyCODONE-acetaminophen (PERCOCET) 7.5-325 MG tablet, Take 1 tablet by mouth 2 (two) times daily as needed for severe pain., Disp: 60 tablet, Rfl: 0   [START ON 04/20/2022] oxyCODONE-acetaminophen (PERCOCET) 7.5-325 MG tablet, Take 1 tablet by mouth 2 (two) times daily as needed for severe pain., Disp: 60 tablet, Rfl: 0   [START ON 05/19/2022] oxyCODONE-acetaminophen (PERCOCET) 7.5-325 MG tablet, Take 1 tablet by mouth 2 (two) times daily as needed for severe pain., Disp: 60 tablet, Rfl: 0   traZODone (DESYREL) 100 MG tablet, Take 1 tablet (100 mg total) by mouth at bedtime. As needed for sleep/ insomnia, Disp: 90 tablet, Rfl: 3   VITAMIN E PO, Take by mouth., Disp: , Rfl:    DULoxetine (CYMBALTA) 60 MG capsule, Take 1 capsule (60 mg total) by mouth daily. Waiting on mail order. Note change in dose, Disp: 30 capsule, Rfl: 0   Objective:    Vitals:   03/15/22 1412  BP: 137/79  Pulse: 69  SpO2: 98%    Physical Exam Vitals and nursing note reviewed.  Constitutional:      Appearance: Normal appearance.  HENT:     Head: Normocephalic and atraumatic.  Cardiovascular:     Rate and Rhythm: Normal rate.     Pulses: Normal pulses.  Pulmonary:     Effort: Pulmonary effort is  normal.  Abdominal:     General: There is no distension.     Palpations: Abdomen is soft.     Tenderness: There is no abdominal tenderness.  Musculoskeletal:        General: No swelling or deformity.  Skin:    General: Skin is warm.     Capillary Refill: Capillary refill takes less than 2 seconds.     Coloration: Skin is not jaundiced.     Findings: No bruising.  Neurological:     Mental Status: She is alert and oriented to person, place, and time.  Psychiatric:        Mood and Affect: Mood normal.        Behavior: Behavior normal.        Thought Content: Thought content normal.        Judgment: Judgment normal.     Assessment & Plan:  Disc degeneration, lumbosacral  Neck pain  Chronic bilateral thoracic back pain  Symptomatic mammary hypertrophy  The procedure the patient selected and that was best for the patient was discussed. The risk were discussed and include but not limited to the following:  Breast asymmetry, fluid accumulation, firmness of the breast, inability to breast feed, loss of nipple or areola, skin loss, change in skin and nipple sensation, fat necrosis of the breast tissue, bleeding, infection and healing delay.  There are risks of anesthesia and injury to nerves or blood vessels.  Allergic reaction to tape, suture and skin glue are possible.  There will be swelling.  Any of these can lead to the need for revisional surgery.  A breast reduction has potential to interfere with diagnostic procedures in the future.  This procedure is best done when the breast is fully developed.  Changes in the breast will continue to occur  over time: pregnancy, weight gain or weigh loss.    Total time: 40 minutes. This includes time spent with the patient during the visit as well as time spent before and after the visit reviewing the chart, documenting the encounter, ordering pertinent studies and literature for the patient.    With her spine surgeries I would like to move ahead with submitting the request for surgery:  bilateral breast reduction with possible liposuction.  Pictures were obtained of the patient and placed in the chart with the patient's or guardian's permission.   [place], DO

## 2022-03-19 ENCOUNTER — Telehealth: Payer: Self-pay | Admitting: *Deleted

## 2022-03-19 NOTE — Telephone Encounter (Signed)
Faxed order,demographics,copy of insurance card,and recent office notes to [facility] on (03/19/22).    Copy of order scanned into the chart.//AB/CMA

## 2022-04-13 NOTE — Patient Instructions (Signed)
DUE TO COVID-19 ONLY TWO VISITORS  (aged 66 and older)  ARE ALLOWED TO COME WITH YOU AND STAY IN THE WAITING ROOM ONLY DURING PRE OP AND PROCEDURE.   **NO VISITORS ARE ALLOWED IN THE SHORT STAY AREA OR RECOVERY ROOM!!**  IF YOU WILL BE ADMITTED INTO THE HOSPITAL YOU ARE ALLOWED ONLY FOUR SUPPORT PEOPLE DURING VISITATION HOURS ONLY (7 AM -8PM)   The support person(s) must pass our screening, gel in and out, and wear a mask at all times, including in the patient's room. Patients must also wear a mask when staff or their support person are in the room. Visitors GUEST BADGE MUST BE WORN VISIBLY  One adult visitor may remain with you overnight and MUST be in the room by 8 P.M.     Your procedure is scheduled on: 05/07/22   Report to [facility] Main Entrance    Report to admitting at  5:15 AM   Call this number if you have problems the morning of surgery [phone]   Do not eat food :After Midnight.   After Midnight you may have the following liquids until _4:15_____ AM/  DAY OF SURGERY  Water Black Coffee (sugar ok, NO MILK/CREAM OR CREAMERS)  Tea (sugar ok, NO MILK/CREAM OR CREAMERS) regular and decaf                             Plain Jell-O (NO RED)                                           Fruit ices (not with fruit pulp, NO RED)                                     Popsicles (NO RED)                                                                  Juice: apple, WHITE grape, WHITE cranberry Sports drinks like Gatorade (NO RED)                    The day of surgery:  Drink ONE (1) Pre-Surgery Clear Ensure  at 4:00 AM the morning of surgery. Drink in one sitting. Do not sip.  This drink was given to you during your hospital  pre-op appointment visit. Nothing else to drink after completing the  Pre-Surgery Clear Ensure at 4:15 AM          If you have questions, please contact your surgeon's office.       Oral Hygiene is also important to reduce your risk of  infection.                                    Remember - BRUSH YOUR TEETH THE MORNING OF SURGERY WITH YOUR REGULAR TOOTHPASTE   Do NOT smoke after Midnight   Take these medicines the morning of surgery with A SIP OF WATER: Gabapentin, Bupropion,  Duloxetine, Omeprazole, Ditropan              Use your inhaler and bring it with you                                You may not have any metal on your body including hair pins, jewelry, and body piercing             Do not wear make-up, lotions, powders, perfumes/cologne, or deodorant  Do not wear nail polish including gel and S&S, artificial/acrylic nails, or any other type of covering on natural nails including finger and toenails. If you have artificial nails, gel coating, etc. that needs to be removed by a nail salon please have this removed prior to surgery or surgery may need to be canceled/ delayed if the surgeon/ anesthesia feels like they are unable to be safely monitored.   Do not shave  48 hours prior to surgery.     Do not bring valuables to the hospital. [place].   Contacts, dentures or bridgework may not be worn into surgery.   Bring small overnight bag day of surgery.   DO NOT [place].    Patients discharged on the day of surgery will not be allowed to drive home.  Someone NEEDS to stay with you for the first 24 hours after anesthesia.   Special Instructions: Bring a copy of your healthcare power of attorney and living will documents  the day of surgery if you haven't scanned them before.              Please read over the following fact sheets you were given: IF YOU HAVE QUESTIONS ABOUT YOUR PRE-OP INSTRUCTIONS PLEASE CALL [phone]     [facility] - Preparing for Surgery Before surgery, you can play an important role.  Because skin is not sterile, your skin needs to be as free of germs as possible.  You can reduce the number of germs on  your skin by washing with CHG (chlorahexidine gluconate) soap before surgery.  CHG is an antiseptic cleaner which kills germs and bonds with the skin to continue killing germs even after washing. Please DO NOT use if you have an allergy to CHG or antibacterial soaps.  If your skin becomes reddened/irritated stop using the CHG and inform your nurse when you arrive at Short Stay. Do not shave (including legs and underarms) for at least 48 hours prior to the first CHG shower.   Please follow these instructions carefully:  1.  Shower with CHG Soap the night before surgery and the  morning of Surgery.  2.  If you choose to wash your hair, wash your hair first as usual with your  normal  shampoo.  3.  After you shampoo, rinse your hair and body thoroughly to remove the  shampoo.                            4.  Use CHG as you would any other liquid soap.  You can apply chg directly  to the skin and wash                       Gently with a scrungie or clean  washcloth.  5.  Apply the CHG Soap to your body ONLY FROM THE NECK DOWN.   Do not use on face/ open                           Wound or open sores. Avoid contact with eyes, ears mouth and genitals (private parts).                       Wash face,  Genitals (private parts) with your normal soap.             6.  Wash thoroughly, paying special attention to the area where your surgery  will be performed.  7.  Thoroughly rinse your body with warm water from the neck down.  8.  DO NOT shower/wash with your normal soap after using and rinsing off  the CHG Soap.                9.  Pat yourself dry with a clean towel.            10.  Wear clean pajamas.            11.  Place clean sheets on your bed the night of your first shower and do not  sleep with pets. Day of Surgery : Do not apply any lotions/deodorants the morning of surgery.  Please wear clean clothes to the hospital/surgery center.  FAILURE TO FOLLOW THESE INSTRUCTIONS MAY RESULT IN THE CANCELLATION  OF YOUR SURGERY  ________________________________________________________________________   Incentive Spirometer  An incentive spirometer is a tool that can help keep your lungs clear and active. This tool measures how well you are filling your lungs with each breath. Taking long deep breaths may help reverse or decrease the chance of developing breathing (pulmonary) problems (especially infection) following: A long period of time when you are unable to move or be active. BEFORE THE PROCEDURE  If the spirometer includes an indicator to show your best effort, your nurse or respiratory therapist will set it to a desired goal. If possible, sit up straight or lean slightly forward. Try not to slouch. Hold the incentive spirometer in an upright position. INSTRUCTIONS FOR USE  Sit on the edge of your bed if possible, or sit up as far as you can in bed or on a chair. Hold the incentive spirometer in an upright position. Breathe out normally. Place the mouthpiece in your mouth and seal your lips tightly around it. Breathe in slowly and as deeply as possible, raising the piston or the ball toward the top of the column. Hold your breath for 3-5 seconds or for as long as possible. Allow the piston or ball to fall to the bottom of the column. Remove the mouthpiece from your mouth and breathe out normally. Rest for a few seconds and repeat Steps 1 through 7 at least 10 times every 1-2 hours when you are awake. Take your time and take a few normal breaths between deep breaths. The spirometer may include an indicator to show your best effort. Use the indicator as a goal to work toward during each repetition. After each set of 10 deep breaths, practice coughing to be sure your lungs are clear. If you have an incision (the cut made at the time of surgery), support your incision when coughing by placing a pillow or rolled up towels firmly against it. Once you are able to get out of bed,  walk around indoors and  cough well. You may stop using the incentive spirometer when instructed by your caregiver.  RISKS AND COMPLICATIONS Take your time so you do not get dizzy or light-headed. If you are in pain, you may need to take or ask for pain medication before doing incentive spirometry. It is harder to take a deep breath if you are having pain. AFTER USE Rest and breathe slowly and easily. It can be helpful to keep track of a log of your progress. Your caregiver can provide you with a simple table to help with this. If you are using the spirometer at home, follow these instructions: [place] IF:  You are having difficultly using the spirometer. You have trouble using the spirometer as often as instructed. Your pain medication is not giving enough relief while using the spirometer. You develop fever of 100.5 F (38.1 C) or higher. SEEK IMMEDIATE MEDICAL CARE IF:  You cough up bloody sputum that had not been present before. You develop fever of 102 F (38.9 C) or greater. You develop worsening pain at or near the incision site. MAKE SURE YOU:  Understand these instructions. Will watch your condition. Will get help right away if you are not doing well or get worse. Document Released: 12/10/2006 Document Revised: 10/22/2011 Document Reviewed: 02/10/2007 [facility] Patient Information 2014 [place], [place].   ________________________________________________________________________

## 2022-04-17 ENCOUNTER — Telehealth: Payer: Self-pay | Admitting: *Deleted

## 2022-04-17 NOTE — Telephone Encounter (Signed)
Auth request for [id number] submitted via [facility] portal  Pending Auth: [id number]

## 2022-04-17 NOTE — H&P (Signed)
TOTAL KNEE ADMISSION H&P  Patient is being admitted for right total knee arthroplasty.  Subjective:  Chief Complaint: Right knee pain.  HPI: Valerie Baker, 66 y.o. female has a history of pain and functional disability in the right knee due to arthritis and has failed non-surgical conservative treatments for greater than 12 weeks to include NSAID's and/or analgesics, corticosteriod injections, weight reduction as appropriate, and activity modification. Onset of symptoms was gradual, starting several years ago with gradually worsening course since that time. The patient noted no past surgery on the right knee.  Patient currently rates pain in the right knee at 8 out of 10 with activity. Patient has night pain, worsening of pain with activity and weight bearing, pain that interferes with activities of daily living, and pain with passive range of motion. Patient has evidence of  severe bone-on-bone arthritis in the medial and patellofemoral compartments of the right knee with varus deformity and tibial subluxation  by imaging studies. There is no active infection.  Patient Active Problem List   Diagnosis Date Noted   Neck pain 03/15/2022   Back pain 03/15/2022   Symptomatic mammary hypertrophy 03/15/2022   Chronic pain syndrome 12/25/2021   Pure hypercholesterolemia 01/22/2020   Dysfunction of both eustachian tubes 04/06/2019   Unilateral primary osteoarthritis, right knee 12/30/2017   Depression, recurrent ([place]) 10/27/2017   Contact dermatitis and eczema due to plant 11/29/2016   Overactive bladder 11/29/2016   Frequent urination 01/23/2016   Urgency of urination 01/23/2016   Rash 01/23/2016   Urinary frequency 04/21/2015   Stress fracture of pelvis 11/03/2014   Disc degeneration, lumbosacral 11/03/2014   Osteopenia 11/03/2014   Severe obesity (BMI >= 40) ([place]) 11/03/2014   Essential hypertension 04/04/2009   Asthma 04/04/2009   MIGRAINES, HX OF 04/04/2009    Past Medical  History:  Diagnosis Date   Adenomatous colon polyp 06/13/2006   Allergy    SINUS   Anxiety    Anxiety and depression    Arthritis    Asthma    Bladder incontinence    Blood transfusion    Cataract    Depression    Fibromyalgia    GERD (gastroesophageal reflux disease)    Hyperlipidemia    Hypertension    IBS (irritable bowel syndrome)    Kidney stones    Migraines    Obesity    Osteopenia    Post-operative nausea and vomiting     Past Surgical History:  Procedure Laterality Date   ABDOMINAL HYSTERECTOMY     APPENDECTOMY     BACK SURGERY  2000   BREAST BIOPSY Bilateral 11/26/2017   FIBROCYSTIC CHANGES WITH CALCIFICATIONS    COLONOSCOPY  06/12/2011   [place] Plan, 03/17/21 MS   DEBRIDEMENT TENNIS ELBOW     ELBOW SURGERY  2003   EYE SURGERY     FOOT SURGERY  1994   right   LUMBAR FUSION  2007   LUMBAR FUSION  2002,2003,2007,2008   NASAL SINUS SURGERY  7510   NISSEN FUNDOPLICATION  2585   ROTATOR CUFF REPAIR  2778,2423   SPINAL CORD STIMULATOR IMPLANT  2005   TOTAL KNEE ARTHROPLASTY  2008,2009    Prior to Admission medications   Medication Sig Start Date End Date Taking? Authorizing Provider  albuterol (PROVENTIL HFA;VENTOLIN HFA) 108 (90 Base) MCG/ACT inhaler Inhale 2 puffs into the lungs every 6 (six) hours as needed for wheezing or shortness of breath. 08/08/18   Eustaquio Maize, MD  Ascorbic Acid (VITAMIN C PO)  Take by mouth.    [provider]  baclofen (LIORESAL) 10 MG tablet TAKE 1/2 TO 1 TABLET BY  MOUTH 3 TIMES DAILY AS  NEEDED FOR MUSCLE SPASMS 11/29/21   Ronnie Doss M, DO  buPROPion ER (WELLBUTRIN SR) 100 MG 12 hr tablet Take 1 tablet (100 mg total) by mouth in the morning. 12/25/21   Janora Norlander, DO  Calcium Carbonate-Vitamin D 600-400 MG-UNIT tablet Take 1 tablet by mouth 2 (two) times daily.    [provider]  Cholecalciferol (VITAMIN D3) 2000 UNITS TABS Take 1 tablet by mouth daily.    [provider]  diclofenac  Sodium (VOLTAREN) 1 % GEL Apply 4 g topically 4 (four) times daily. 08/23/20   Janora Norlander, DO  DULoxetine (CYMBALTA) 60 MG capsule Take 1 capsule (60 mg total) by mouth daily. 12/25/21   Janora Norlander, DO  DULoxetine (CYMBALTA) 60 MG capsule Take 1 capsule (60 mg total) by mouth daily. Waiting on mail order. Note change in dose 12/25/21 01/24/22  Ronnie Doss M, DO  fluticasone (FLONASE) 50 MCG/ACT nasal spray Place 2 sprays into both nostrils daily. 07/28/20   Evelina Dun A, FNP  gabapentin (NEURONTIN) 400 MG capsule TAKE 2 CAPSULES BY MOUTH 3  TIMES DAILY 08/21/21   Ronnie Doss M, DO  L-Lysine 1000 MG TABS Take by mouth.    [provider]  loratadine (CLARITIN) 10 MG tablet Take 10 mg by mouth daily as needed for allergies.    [provider]  meloxicam (MOBIC) 15 MG tablet Take 1 tablet (15 mg total) by mouth daily. 08/21/21   Janora Norlander, DO  Multiple Vitamin (MULTIVITAMIN) tablet Take 1 tablet by mouth daily.    [provider]  Omega-3 Fatty Acids (FISH OIL) 1000 MG CAPS Take by mouth.    [provider]  omeprazole (PRILOSEC) 40 MG capsule Take 1 capsule (40 mg total) by mouth 2 (two) times daily. 08/21/21   Janora Norlander, DO  oxybutynin (DITROPAN) 5 MG tablet Take 1 tablet (5 mg total) by mouth 2 (two) times daily. 08/21/21   Janora Norlander, DO  oxyCODONE-acetaminophen (PERCOCET) 7.5-325 MG tablet Take 1 tablet by mouth 2 (two) times daily as needed for severe pain. 03/21/22   Janora Norlander, DO  oxyCODONE-acetaminophen (PERCOCET) 7.5-325 MG tablet Take 1 tablet by mouth 2 (two) times daily as needed for severe pain. 04/20/22   Janora Norlander, DO  oxyCODONE-acetaminophen (PERCOCET) 7.5-325 MG tablet Take 1 tablet by mouth 2 (two) times daily as needed for severe pain. 05/19/22   Janora Norlander, DO  traZODone (DESYREL) 100 MG tablet Take 1 tablet (100 mg total) by mouth at bedtime. As needed for sleep/ insomnia  08/21/21   Ronnie Doss M, DO  VITAMIN E PO Take by mouth.    [provider]    Allergies  Allergen Reactions   Sulfonamide Derivatives Rash    Social History   Socioeconomic History   Marital status: Married    Spouse name: Not on file   Number of children: 3   Years of education: Not on file   Highest education level: Not on file  Occupational History   Occupation: Disabled  Tobacco Use   Smoking status: Former    Years: 5.00    Types: Cigarettes    Quit date: 11/02/2005    Years since quitting: 16.4   Smokeless tobacco: Never  Vaping Use   Vaping Use: Never used  Substance and Sexual Activity   Alcohol use: No   Drug use: No   Sexual activity: Yes  Other Topics Concern   Not on file  Social History Narrative   Not on file   Social Determinants of Health   Financial Resource Strain: Low Risk  (07/17/2017)   Overall Financial Resource Strain (CARDIA)    Difficulty of Paying Living Expenses: Not hard at all  Food Insecurity: No Food Insecurity (07/17/2017)   Hunger Vital Sign    Worried About Running Out of Food in the Last Year: Never true    Ran Out of Food in the Last Year: Never true  Transportation Needs: No Transportation Needs (07/17/2017)   PRAPARE - Hydrologist (Medical): No    Lack of Transportation (Non-Medical): No  Physical Activity: Unknown (07/17/2017)   Exercise Vital Sign    Days of Exercise per Week: 0 days    Minutes of Exercise per Session: Not on file  Stress: No Stress Concern Present (07/17/2017)   [place]    Feeling of Stress : Not at all  Social Connections: [place] (07/17/2017)   Social Connection and Isolation Panel [NHANES]    Frequency of Communication with Friends and Family: More than three times a week    Frequency of Social Gatherings with Friends and Family: More than three times a week    Attends Religious  Services: More than 4 times per year    Active Member of [employer] or Organizations: Yes    Attends Music therapist: More than 4 times per year    Marital Status: Married  Human resources officer Violence: Not on file    Tobacco Use: Medium Risk (03/15/2022)   Patient History    Smoking Tobacco Use: Former    Smokeless Tobacco Use: Never    Passive Exposure: Not on file   Social History   Substance and Sexual Activity  Alcohol Use No    Family History  Problem Relation Age of Onset   Heart disease Mother    Diabetes Mother    Melanoma Mother        nose   Stroke Father    Colon polyps Sister    Hypertension Sister    Skin cancer Sister    Diabetes Brother    Cirrhosis Brother    Hypertension Brother    Colon polyps Brother    Hypertension Brother    Stomach cancer Other    Rectal cancer Other    Esophageal cancer Other    Colon cancer Other     Review of Systems  Constitutional:  Negative for chills and fever.  HENT: Negative.    Eyes: Negative.   Respiratory:  Negative for cough and shortness of breath.   Cardiovascular:  Negative for chest pain and palpitations.  Gastrointestinal:  Negative for abdominal pain, constipation, diarrhea, nausea and vomiting.  Genitourinary:  Negative for dysuria, frequency and urgency.  Musculoskeletal:  Positive for joint pain.  Skin:  Negative for rash.   Objective:  Physical Exam: Well nourished and well developed.  General: Alert and oriented x3, cooperative and pleasant, no acute distress.  Head: normocephalic, atraumatic, neck supple.  Eyes: EOMI.  Abdomen: non-tender to palpation and soft, normoactive bowel sounds. Musculoskeletal: Right Hip Exam: The range of motion: normal without discomfort. There is no tenderness over the greater trochanter bursa.  Right Knee Exam: No effusion present. No swelling present. The range  of motion is: 0 to 110 degrees. Moderate crepitus on range of motion of the knee. Positive  medial joint line tenderness. No lateral joint line tenderness. The knee is stable.  Left Hip Exam: The range of motion: Flexion to 110 degrees, Internal Rotation to 20 degrees, External Rotation to 30 degrees, and abduction to 30 degrees without discomfort. There is significant tenderness over the greater trochanteric bursa. Palpation reproduces the pain that she is experiencing. There is no pain on provocative testing of the hip.  Calves soft and nontender. Motor function intact in LE. Strength 5/5 LE bilaterally. Neuro: Distal pulses 2+. Sensation to light touch intact in LE.  Vital signs in last 24 hours: BP: ()/()  Arterial Line BP: ()/()   Imaging Review Plain radiographs demonstrate severe degenerative joint disease of the right knee. The overall alignment is mild varus. The bone quality appears to be adequate for age and reported activity level.  Assessment/Plan:  End stage arthritis, right knee   The patient history, physical examination, clinical judgment of the provider and imaging studies are consistent with end stage degenerative joint disease of the right knee and total knee arthroplasty is deemed medically necessary. The treatment options including medical management, injection therapy arthroscopy and arthroplasty were discussed at length. The risks and benefits of total knee arthroplasty were presented and reviewed. The risks due to aseptic loosening, infection, stiffness, patella tracking problems, thromboembolic complications and other imponderables were discussed. The patient acknowledged the explanation, agreed to proceed with the plan and consent was signed. Patient is being admitted for inpatient treatment for surgery, pain control, PT, OT, prophylactic antibiotics, VTE prophylaxis, progressive ambulation and ADLs and discharge planning. The patient is planning to be discharged  home .  Patient's anticipated LOS is less than 2 midnights, meeting these requirements: -  Lives within 1 hour of care - Has a competent adult at home to recover with post-op - NO history of  - Chronic pain requiring opioids  - Diabetes  - Coronary Artery Disease  - Heart failure  - Heart attack  - Stroke  - DVT/VTE  - Cardiac arrhythmia  - Respiratory Failure/COPD  - Renal failure  - Anemia  - Advanced Liver disease  Therapy Plans: [place] Disposition: Home with Husband Planned DVT Prophylaxis: Aspirin 325 mg BID DME Needed: None PCP: Ronnie Doss, DO (clearance received) TXA: IV Allergies: sulfa antibiotics (rash), latex (rash) Anesthesia Concerns: N/V, hx of spinal fusion BMI: 38 Last HgbA1c: not diabetic.  Pharmacy: [facility] [place], [place])  Other: -currently taking percocet 7.5-325 mg BID - tolerated increasing oxycodone dose to 10 mg after back surgery last year  - Patient was instructed on what medications to stop prior to surgery. - Follow-up visit in 2 weeks with Dr. Wynelle Link - Begin physical therapy following surgery - Pre-operative lab work as pre-surgical testing - Prescriptions will be provided in hospital at time of discharge  R. Jaynie Bream, PA-C Orthopedic Surgery [facility] [place]

## 2022-04-25 ENCOUNTER — Encounter (HOSPITAL_COMMUNITY): Payer: Self-pay

## 2022-04-25 ENCOUNTER — Encounter (HOSPITAL_COMMUNITY)
Admission: RE | Admit: 2022-04-25 | Discharge: 2022-04-25 | Disposition: A | Discharge status: Home / Self Care | Payer: Medicare Other | Source: Ambulatory Visit | Attending: Orthopedic Surgery | Admitting: Orthopedic Surgery

## 2022-04-25 ENCOUNTER — Other Ambulatory Visit: Payer: Self-pay

## 2022-04-25 DIAGNOSIS — I10 Essential (primary) hypertension: Secondary | ICD-10-CM | POA: Diagnosis not present

## 2022-04-25 DIAGNOSIS — Z01812 Encounter for preprocedural laboratory examination: Secondary | ICD-10-CM | POA: Insufficient documentation

## 2022-04-25 DIAGNOSIS — Z01818 Encounter for other preprocedural examination: Secondary | ICD-10-CM

## 2022-04-25 HISTORY — DX: Personal history of urinary calculi: Z87.442

## 2022-04-25 LAB — BASIC METABOLIC PANEL
Anion gap: 5 (ref 5–15)
BUN: 15 mg/dL (ref 8–23)
CO2: 27 mmol/L (ref 22–32)
Calcium: 9.2 mg/dL (ref 8.9–10.3)
Chloride: 108 mmol/L (ref 98–111)
Creatinine, Ser: 0.69 mg/dL (ref 0.44–1.00)
GFR, Estimated: >60 mL/min (ref >60)
Glucose, Bld: 96 mg/dL (ref 70–99)
Potassium: 4.5 mmol/L (ref 3.5–5.1)
Sodium: 140 mmol/L (ref 135–145)

## 2022-04-25 LAB — CBC
HCT: 37.3 % (ref 36.0–46.0)
Hemoglobin: 12.2 g/dL (ref 12.0–15.0)
MCH: 31.6 pg (ref 26.0–34.0)
MCHC: 32.7 g/dL (ref 30.0–36.0)
MCV: 96.6 fL (ref 80.0–100.0)
Platelets: 204 10*3/uL (ref 150–400)
RBC: 3.86 MIL/uL — ABNORMAL LOW (ref 3.87–5.11)
RDW: 12.5 % (ref 11.5–15.5)
WBC: 5.6 10*3/uL (ref 4.0–10.5)
nRBC: 0 % (ref 0.0–0.2)

## 2022-04-25 LAB — SURGICAL PCR SCREEN
MRSA, PCR: NEGATIVE
Staphylococcus aureus: NEGATIVE

## 2022-04-25 NOTE — Progress Notes (Signed)
Anesthesia note:  Bowel prep reminder:NA  PCP - Jacinto Reap Cardiologist -none Other-   Chest x-ray - none EKG - 03/14/22-epic Stress Test - no ECHO - no Cardiac Cath - na  Pacemaker/ICD device last checked:NA  Sleep Study - no CPAP -   Pt is pre diabetic-NA Fasting Blood Sugar -  Checks Blood Sugar _____  Blood Thinner:NA Blood Thinner Instructions: Aspirin Instructions: Last Dose:  Anesthesia review: yes  Patient denies shortness of breath, fever, cough and chest pain at PAT appointment Pt has spinal fusions neck to lumbar. She needs to be awake to position neck before surgery.  Patient verbalized understanding of instructions that were given to them at the PAT appointment. Patient was also instructed that they will need to review over the PAT instructions again at home before surgery. yes

## 2022-04-27 ENCOUNTER — Telehealth: Payer: Self-pay | Admitting: Plastic Surgery

## 2022-04-27 NOTE — Telephone Encounter (Signed)
Per pt, she received insurance authorization for her surgery but it has to be done by Dec 12 and would like to have someone call her early next week to get that scheduled asap

## 2022-05-02 ENCOUNTER — Telehealth: Payer: Self-pay | Admitting: Plastic Surgery

## 2022-05-02 NOTE — Telephone Encounter (Signed)
Pt called to let us know that she has been approved for breast reduction surgery from 04/25/2022 to 07/24/2022 and she would like to move forward with getting it scheduled the first week in December.  Pt would like to have a call back.

## 2022-05-07 ENCOUNTER — Other Ambulatory Visit: Payer: Self-pay

## 2022-05-07 ENCOUNTER — Ambulatory Visit (HOSPITAL_COMMUNITY): Payer: Medicare Other | Admitting: Anesthesiology

## 2022-05-07 ENCOUNTER — Ambulatory Visit (HOSPITAL_BASED_OUTPATIENT_CLINIC_OR_DEPARTMENT_OTHER): Payer: Medicare Other | Admitting: Anesthesiology

## 2022-05-07 ENCOUNTER — Encounter (HOSPITAL_COMMUNITY): Payer: Self-pay | Admitting: Orthopedic Surgery

## 2022-05-07 ENCOUNTER — Encounter (HOSPITAL_COMMUNITY)
Admission: RE | Disposition: A | Discharge status: Home Health | Payer: Self-pay | Source: Ambulatory Visit | Attending: Orthopedic Surgery

## 2022-05-07 ENCOUNTER — Observation Stay (HOSPITAL_COMMUNITY)
Admission: RE | Admit: 2022-05-07 | Discharge: 2022-05-09 | Disposition: A | Discharge status: Home Health | Payer: Medicare Other | Source: Ambulatory Visit | Attending: Orthopedic Surgery | Admitting: Orthopedic Surgery

## 2022-05-07 DIAGNOSIS — M1711 Unilateral primary osteoarthritis, right knee: Secondary | ICD-10-CM | POA: Diagnosis not present

## 2022-05-07 DIAGNOSIS — Z91040 Latex allergy status: Secondary | ICD-10-CM | POA: Diagnosis not present

## 2022-05-07 DIAGNOSIS — Z01818 Encounter for other preprocedural examination: Secondary | ICD-10-CM

## 2022-05-07 DIAGNOSIS — M179 Osteoarthritis of knee, unspecified: Secondary | ICD-10-CM | POA: Diagnosis present

## 2022-05-07 DIAGNOSIS — I10 Essential (primary) hypertension: Secondary | ICD-10-CM | POA: Diagnosis not present

## 2022-05-07 DIAGNOSIS — Z87891 Personal history of nicotine dependence: Secondary | ICD-10-CM | POA: Insufficient documentation

## 2022-05-07 DIAGNOSIS — Z96659 Presence of unspecified artificial knee joint: Secondary | ICD-10-CM | POA: Diagnosis not present

## 2022-05-07 DIAGNOSIS — J45909 Unspecified asthma, uncomplicated: Secondary | ICD-10-CM | POA: Diagnosis not present

## 2022-05-07 DIAGNOSIS — G8918 Other acute postprocedural pain: Secondary | ICD-10-CM | POA: Diagnosis not present

## 2022-05-07 DIAGNOSIS — Z79899 Other long term (current) drug therapy: Secondary | ICD-10-CM | POA: Insufficient documentation

## 2022-05-07 HISTORY — PX: TOTAL KNEE ARTHROPLASTY: SHX125

## 2022-05-07 SURGERY — ARTHROPLASTY, KNEE, TOTAL
Anesthesia: Regional | Site: Knee | Laterality: Right

## 2022-05-07 MED ORDER — BACLOFEN 10 MG PO TABS
10.0000 mg | ORAL_TABLET | Freq: Three times a day (TID) | ORAL | Status: DC
  Administered 2022-05-07 – 2022-05-09 (×7): 10 mg via ORAL
  Filled 2022-05-07 (×7): qty 1

## 2022-05-07 MED ORDER — DULOXETINE HCL 60 MG PO CPEP
60.0000 mg | ORAL_CAPSULE | Freq: Every day | ORAL | Status: DC
  Administered 2022-05-08 – 2022-05-09 (×2): 60 mg via ORAL
  Filled 2022-05-07 (×2): qty 1

## 2022-05-07 MED ORDER — PROPOFOL 10 MG/ML IV BOLUS
INTRAVENOUS | Status: AC
  Filled 2022-05-07: qty 20

## 2022-05-07 MED ORDER — CEFAZOLIN SODIUM-DEXTROSE 2-4 GM/100ML-% IV SOLN
2.0000 g | INTRAVENOUS | Status: AC
  Administered 2022-05-07: 2 g via INTRAVENOUS
  Filled 2022-05-07: qty 100

## 2022-05-07 MED ORDER — HYDROMORPHONE HCL 1 MG/ML IJ SOLN
INTRAMUSCULAR | Status: DC | PRN
  Administered 2022-05-07: .25 mg via INTRAVENOUS
  Administered 2022-05-07: .5 mg via INTRAVENOUS

## 2022-05-07 MED ORDER — POLYETHYLENE GLYCOL 3350 17 G PO PACK: 17.0000 g | PACK | Freq: Every day | ORAL | Status: DC | PRN

## 2022-05-07 MED ORDER — PROMETHAZINE HCL 25 MG/ML IJ SOLN: 6.2500 mg | INTRAMUSCULAR | Status: DC | PRN

## 2022-05-07 MED ORDER — SODIUM CHLORIDE 0.9 % IR SOLN
Status: DC | PRN
  Administered 2022-05-07 (×2): 1000 mL

## 2022-05-07 MED ORDER — ACETAMINOPHEN 10 MG/ML IV SOLN
1000.0000 mg | Freq: Four times a day (QID) | INTRAVENOUS | Status: DC
  Administered 2022-05-07: 1000 mg via INTRAVENOUS
  Filled 2022-05-07: qty 100

## 2022-05-07 MED ORDER — ACETAMINOPHEN 500 MG PO TABS
1000.0000 mg | ORAL_TABLET | Freq: Four times a day (QID) | ORAL | Status: AC
  Administered 2022-05-07 – 2022-05-08 (×4): 1000 mg via ORAL
  Filled 2022-05-07 (×4): qty 2

## 2022-05-07 MED ORDER — PHENYLEPHRINE HCL-NACL 20-0.9 MG/250ML-% IV SOLN
INTRAVENOUS | Status: AC
  Filled 2022-05-07: qty 250

## 2022-05-07 MED ORDER — GABAPENTIN 400 MG PO CAPS
400.0000 mg | ORAL_CAPSULE | Freq: Every day | ORAL | Status: DC
  Administered 2022-05-08 – 2022-05-09 (×2): 400 mg via ORAL
  Filled 2022-05-07 (×2): qty 1

## 2022-05-07 MED ORDER — SODIUM CHLORIDE (PF) 0.9 % IJ SOLN
INTRAMUSCULAR | Status: AC
  Filled 2022-05-07: qty 10

## 2022-05-07 MED ORDER — FLEET ENEMA 7-19 GM/118ML RE ENEM: 1.0000 | ENEMA | Freq: Once | RECTAL | Status: DC | PRN

## 2022-05-07 MED ORDER — FLUTICASONE PROPIONATE 50 MCG/ACT NA SUSP
2.0000 | Freq: Every day | NASAL | Status: DC
  Administered 2022-05-07 – 2022-05-08 (×2): 2 via NASAL
  Filled 2022-05-07: qty 16

## 2022-05-07 MED ORDER — FENTANYL CITRATE (PF) 100 MCG/2ML IJ SOLN
INTRAMUSCULAR | Status: AC
  Filled 2022-05-07: qty 2

## 2022-05-07 MED ORDER — LORATADINE 10 MG PO TABS
10.0000 mg | ORAL_TABLET | Freq: Every day | ORAL | Status: DC
  Administered 2022-05-08 – 2022-05-09 (×2): 10 mg via ORAL
  Filled 2022-05-07 (×2): qty 1

## 2022-05-07 MED ORDER — HYDROMORPHONE HCL 1 MG/ML IJ SOLN
0.2500 mg | INTRAMUSCULAR | Status: DC | PRN
  Administered 2022-05-07 (×2): 0.5 mg via INTRAVENOUS
  Administered 2022-05-07 (×2): 0.25 mg via INTRAVENOUS

## 2022-05-07 MED ORDER — DIPHENHYDRAMINE HCL 12.5 MG/5ML PO ELIX: 12.5000 mg | ORAL_SOLUTION | ORAL | Status: DC | PRN

## 2022-05-07 MED ORDER — BUPIVACAINE LIPOSOME 1.3 % IJ SUSP
INTRAMUSCULAR | Status: DC | PRN
  Administered 2022-05-07: 20 mL

## 2022-05-07 MED ORDER — GABAPENTIN 400 MG PO CAPS
800.0000 mg | ORAL_CAPSULE | ORAL | Status: DC
  Administered 2022-05-07 – 2022-05-09 (×3): 800 mg via ORAL
  Filled 2022-05-07 (×3): qty 2

## 2022-05-07 MED ORDER — CHLORHEXIDINE GLUCONATE 0.12 % MT SOLN
15.0000 mL | Freq: Once | OROMUCOSAL | Status: AC
  Administered 2022-05-07: 15 mL via OROMUCOSAL

## 2022-05-07 MED ORDER — OXYCODONE HCL 5 MG/5ML PO SOLN: 5.0000 mg | Freq: Once | ORAL | Status: DC | PRN

## 2022-05-07 MED ORDER — BUPIVACAINE-EPINEPHRINE (PF) 0.25% -1:200000 IJ SOLN
INTRAMUSCULAR | Status: AC
  Filled 2022-05-07: qty 30

## 2022-05-07 MED ORDER — CYCLOSPORINE 0.05 % OP EMUL
1.0000 [drp] | Freq: Two times a day (BID) | OPHTHALMIC | Status: DC
  Administered 2022-05-08 – 2022-05-09 (×2): 1 [drp] via OPHTHALMIC
  Filled 2022-05-07 (×2): qty 30

## 2022-05-07 MED ORDER — ORAL CARE MOUTH RINSE: 15.0000 mL | Freq: Once | OROMUCOSAL | Status: AC

## 2022-05-07 MED ORDER — DEXAMETHASONE SODIUM PHOSPHATE 10 MG/ML IJ SOLN
8.0000 mg | Freq: Once | INTRAMUSCULAR | Status: AC
  Administered 2022-05-07: 8 mg via INTRAVENOUS

## 2022-05-07 MED ORDER — PHENYLEPHRINE HCL-NACL 20-0.9 MG/250ML-% IV SOLN
INTRAVENOUS | Status: DC | PRN
  Administered 2022-05-07: 30 ug/min via INTRAVENOUS

## 2022-05-07 MED ORDER — ONDANSETRON HCL 4 MG/2ML IJ SOLN: 4.0000 mg | Freq: Four times a day (QID) | INTRAMUSCULAR | Status: DC | PRN

## 2022-05-07 MED ORDER — OXYCODONE HCL 5 MG PO TABS
10.0000 mg | ORAL_TABLET | ORAL | Status: DC | PRN
  Administered 2022-05-07 – 2022-05-08 (×4): 10 mg via ORAL
  Administered 2022-05-08 – 2022-05-09 (×2): 15 mg via ORAL
  Filled 2022-05-07 (×3): qty 2
  Filled 2022-05-07 (×2): qty 3
  Filled 2022-05-07 (×2): qty 2

## 2022-05-07 MED ORDER — DEXAMETHASONE SODIUM PHOSPHATE 10 MG/ML IJ SOLN
INTRAMUSCULAR | Status: AC
  Filled 2022-05-07: qty 1

## 2022-05-07 MED ORDER — SODIUM CHLORIDE (PF) 0.9 % IJ SOLN
INTRAMUSCULAR | Status: AC
  Filled 2022-05-07: qty 50

## 2022-05-07 MED ORDER — TRANEXAMIC ACID-NACL 1000-0.7 MG/100ML-% IV SOLN
1000.0000 mg | INTRAVENOUS | Status: AC
  Administered 2022-05-07: 1000 mg via INTRAVENOUS
  Filled 2022-05-07: qty 100

## 2022-05-07 MED ORDER — HYDROMORPHONE HCL 1 MG/ML IJ SOLN
INTRAMUSCULAR | Status: AC
  Filled 2022-05-07: qty 1

## 2022-05-07 MED ORDER — PANTOPRAZOLE SODIUM 40 MG PO TBEC
80.0000 mg | DELAYED_RELEASE_TABLET | Freq: Every day | ORAL | Status: DC
  Administered 2022-05-08 – 2022-05-09 (×2): 80 mg via ORAL
  Filled 2022-05-07 (×2): qty 2

## 2022-05-07 MED ORDER — CEFAZOLIN SODIUM-DEXTROSE 2-4 GM/100ML-% IV SOLN
2.0000 g | Freq: Four times a day (QID) | INTRAVENOUS | Status: AC
  Administered 2022-05-07 (×2): 2 g via INTRAVENOUS
  Filled 2022-05-07 (×2): qty 100

## 2022-05-07 MED ORDER — HYDROMORPHONE HCL 2 MG/ML IJ SOLN
INTRAMUSCULAR | Status: AC
  Filled 2022-05-07: qty 1

## 2022-05-07 MED ORDER — MIDAZOLAM HCL 5 MG/5ML IJ SOLN
INTRAMUSCULAR | Status: DC | PRN
  Administered 2022-05-07 (×2): 1 mg via INTRAVENOUS

## 2022-05-07 MED ORDER — SCOPOLAMINE 1 MG/3DAYS TD PT72
MEDICATED_PATCH | TRANSDERMAL | Status: DC | PRN
  Administered 2022-05-07: 1 via TRANSDERMAL

## 2022-05-07 MED ORDER — ALBUTEROL SULFATE (2.5 MG/3ML) 0.083% IN NEBU: 2.5000 mg | INHALATION_SOLUTION | Freq: Four times a day (QID) | RESPIRATORY_TRACT | Status: DC | PRN

## 2022-05-07 MED ORDER — SODIUM CHLORIDE 0.9 % IV SOLN: INTRAVENOUS | Status: DC

## 2022-05-07 MED ORDER — MORPHINE SULFATE (PF) 2 MG/ML IV SOLN
1.0000 mg | INTRAVENOUS | Status: DC | PRN
  Administered 2022-05-07 (×3): 2 mg via INTRAVENOUS
  Filled 2022-05-07 (×3): qty 1

## 2022-05-07 MED ORDER — METOCLOPRAMIDE HCL 5 MG/ML IJ SOLN: 5.0000 mg | Freq: Three times a day (TID) | INTRAMUSCULAR | Status: DC | PRN

## 2022-05-07 MED ORDER — SODIUM CHLORIDE (PF) 0.9 % IJ SOLN
INTRAMUSCULAR | Status: DC | PRN
  Administered 2022-05-07: 60 mL

## 2022-05-07 MED ORDER — OXYCODONE HCL 5 MG PO TABS: 5.0000 mg | ORAL_TABLET | Freq: Once | ORAL | Status: DC | PRN

## 2022-05-07 MED ORDER — LACTATED RINGERS IV SOLN
INTRAVENOUS | Status: DC
Start: 2022-05-07 — End: 2022-05-07

## 2022-05-07 MED ORDER — PHENOL 1.4 % MT LIQD: 1.0000 | OROMUCOSAL | Status: DC | PRN

## 2022-05-07 MED ORDER — PROPOFOL 500 MG/50ML IV EMUL
INTRAVENOUS | Status: DC | PRN
  Administered 2022-05-07: 30 ug/kg/min via INTRAVENOUS

## 2022-05-07 MED ORDER — STERILE WATER FOR IRRIGATION IR SOLN
Status: DC | PRN
  Administered 2022-05-07: 2000 mL

## 2022-05-07 MED ORDER — MENTHOL 3 MG MT LOZG: 1.0000 | LOZENGE | OROMUCOSAL | Status: DC | PRN

## 2022-05-07 MED ORDER — GABAPENTIN 400 MG PO CAPS: 400.0000 mg | ORAL_CAPSULE | ORAL | Status: DC

## 2022-05-07 MED ORDER — POLYVINYL ALCOHOL 1.4 % OP SOLN
1.0000 [drp] | Freq: Every evening | OPHTHALMIC | Status: DC
  Administered 2022-05-07 – 2022-05-08 (×2): 1 [drp] via OPHTHALMIC
  Filled 2022-05-07: qty 15

## 2022-05-07 MED ORDER — ROPIVACAINE HCL 5 MG/ML IJ SOLN
INTRAMUSCULAR | Status: DC | PRN
  Administered 2022-05-07: 20 mL via PERINEURAL

## 2022-05-07 MED ORDER — MIDAZOLAM HCL 2 MG/2ML IJ SOLN
INTRAMUSCULAR | Status: AC
  Filled 2022-05-07: qty 2

## 2022-05-07 MED ORDER — LIDOCAINE HCL (CARDIAC) PF 100 MG/5ML IV SOSY
PREFILLED_SYRINGE | INTRAVENOUS | Status: DC | PRN
  Administered 2022-05-07: 60 mg via INTRAVENOUS

## 2022-05-07 MED ORDER — DOCUSATE SODIUM 100 MG PO CAPS
100.0000 mg | ORAL_CAPSULE | Freq: Two times a day (BID) | ORAL | Status: DC
  Administered 2022-05-07 – 2022-05-09 (×4): 100 mg via ORAL
  Filled 2022-05-07 (×4): qty 1

## 2022-05-07 MED ORDER — BUPIVACAINE LIPOSOME 1.3 % IJ SUSP
INTRAMUSCULAR | Status: AC
  Filled 2022-05-07: qty 20

## 2022-05-07 MED ORDER — METOCLOPRAMIDE HCL 5 MG PO TABS: 5.0000 mg | ORAL_TABLET | Freq: Three times a day (TID) | ORAL | Status: DC | PRN

## 2022-05-07 MED ORDER — SCOPOLAMINE 1 MG/3DAYS TD PT72
MEDICATED_PATCH | TRANSDERMAL | Status: AC
  Filled 2022-05-07: qty 1

## 2022-05-07 MED ORDER — GABAPENTIN 400 MG PO CAPS
800.0000 mg | ORAL_CAPSULE | Freq: Every day | ORAL | Status: DC
  Administered 2022-05-07 – 2022-05-08 (×2): 800 mg via ORAL
  Filled 2022-05-07 (×2): qty 2

## 2022-05-07 MED ORDER — LACTATED RINGERS IV SOLN: INTRAVENOUS | Status: DC

## 2022-05-07 MED ORDER — BISACODYL 10 MG RE SUPP: 10.0000 mg | Freq: Every day | RECTAL | Status: DC | PRN

## 2022-05-07 MED ORDER — ESMOLOL HCL 100 MG/10ML IV SOLN
INTRAVENOUS | Status: DC | PRN
  Administered 2022-05-07: 20 mg via INTRAVENOUS

## 2022-05-07 MED ORDER — DEXAMETHASONE SODIUM PHOSPHATE 10 MG/ML IJ SOLN
10.0000 mg | Freq: Once | INTRAMUSCULAR | Status: AC
  Administered 2022-05-08: 10 mg via INTRAVENOUS
  Filled 2022-05-07: qty 1

## 2022-05-07 MED ORDER — FENTANYL CITRATE (PF) 100 MCG/2ML IJ SOLN
INTRAMUSCULAR | Status: DC | PRN
  Administered 2022-05-07 (×4): 50 ug via INTRAVENOUS

## 2022-05-07 MED ORDER — OXYBUTYNIN CHLORIDE 5 MG PO TABS
5.0000 mg | ORAL_TABLET | Freq: Two times a day (BID) | ORAL | Status: DC
  Administered 2022-05-07 – 2022-05-09 (×4): 5 mg via ORAL
  Filled 2022-05-07 (×4): qty 1

## 2022-05-07 MED ORDER — PROPOFOL 10 MG/ML IV BOLUS
INTRAVENOUS | Status: DC | PRN
  Administered 2022-05-07: 160 mg via INTRAVENOUS

## 2022-05-07 MED ORDER — ONDANSETRON HCL 4 MG/2ML IJ SOLN
INTRAMUSCULAR | Status: DC | PRN
  Administered 2022-05-07: 4 mg via INTRAVENOUS

## 2022-05-07 MED ORDER — POVIDONE-IODINE 10 % EX SWAB
2.0000 | Freq: Once | CUTANEOUS | Status: AC
  Administered 2022-05-07: 2 via TOPICAL

## 2022-05-07 MED ORDER — PROPOFOL 1000 MG/100ML IV EMUL
INTRAVENOUS | Status: AC
  Filled 2022-05-07: qty 100

## 2022-05-07 MED ORDER — ONDANSETRON HCL 4 MG PO TABS: 4.0000 mg | ORAL_TABLET | Freq: Four times a day (QID) | ORAL | Status: DC | PRN

## 2022-05-07 MED ORDER — BUPROPION HCL ER (SR) 100 MG PO TB12
100.0000 mg | ORAL_TABLET | Freq: Every day | ORAL | Status: DC
  Administered 2022-05-08 – 2022-05-09 (×2): 100 mg via ORAL
  Filled 2022-05-07 (×2): qty 1

## 2022-05-07 MED ORDER — BUPIVACAINE LIPOSOME 1.3 % IJ SUSP: 20.0000 mL | Freq: Once | INTRAMUSCULAR | Status: AC

## 2022-05-07 MED ORDER — ESMOLOL HCL 100 MG/10ML IV SOLN
INTRAVENOUS | Status: AC
  Filled 2022-05-07: qty 10

## 2022-05-07 MED ORDER — TRAZODONE HCL 50 MG PO TABS
50.0000 mg | ORAL_TABLET | Freq: Every day | ORAL | Status: DC
  Administered 2022-05-07 – 2022-05-08 (×2): 50 mg via ORAL
  Filled 2022-05-07 (×2): qty 1

## 2022-05-07 MED ORDER — ASPIRIN 325 MG PO TBEC
325.0000 mg | DELAYED_RELEASE_TABLET | Freq: Two times a day (BID) | ORAL | Status: DC
  Administered 2022-05-08 – 2022-05-09 (×3): 325 mg via ORAL
  Filled 2022-05-07 (×3): qty 1

## 2022-05-07 SURGICAL SUPPLY — 55 items
ATTUNE MED DOME PAT 38 KNEE (Knees) IMPLANT
ATTUNE PS FEM RT SZ 5 CEM KNEE (Femur) IMPLANT
ATTUNE PSRP INSR SZ5 8 KNEE (Insert) IMPLANT
BAG COUNTER SPONGE SURGICOUNT (BAG) IMPLANT
BAG SPEC THK2 15X12 ZIP CLS (MISCELLANEOUS) ×1
BAG SPNG CNTER NS LX DISP (BAG) ×1
BAG ZIPLOCK 12X15 (MISCELLANEOUS) ×2 IMPLANT
BASE TIBIAL ROT PLAT SZ 5 KNEE (Knees) IMPLANT
BLADE SAG 18X100X1.27 (BLADE) ×2 IMPLANT
BLADE SAW SGTL 11.0X1.19X90.0M (BLADE) ×2 IMPLANT
BNDG ELASTIC 6X5.8 VLCR STR LF (GAUZE/BANDAGES/DRESSINGS) ×2 IMPLANT
BOWL SMART MIX CTS (DISPOSABLE) ×2 IMPLANT
BSPLAT TIB 5 CMNT ROT PLAT STR (Knees) ×1 IMPLANT
CEMENT HV SMART SET (Cement) ×4 IMPLANT
COVER SURGICAL LIGHT HANDLE (MISCELLANEOUS) ×2 IMPLANT
CUFF TOURN SGL QUICK 34 (TOURNIQUET CUFF) ×1
CUFF TRNQT CYL 34X4.125X (TOURNIQUET CUFF) ×2 IMPLANT
DRAPE INCISE IOBAN 66X45 STRL (DRAPES) ×2 IMPLANT
DRAPE U-SHAPE 47X51 STRL (DRAPES) ×2 IMPLANT
DRSG AQUACEL AG ADV 3.5X10 (GAUZE/BANDAGES/DRESSINGS) ×2 IMPLANT
DURAPREP 26ML APPLICATOR (WOUND CARE) ×2 IMPLANT
ELECT REM PT RETURN 15FT ADLT (MISCELLANEOUS) ×2 IMPLANT
GLOVE BIO SURGEON STRL SZ 6.5 (GLOVE) IMPLANT
GLOVE BIO SURGEON STRL SZ7.5 (GLOVE) IMPLANT
GLOVE BIO SURGEON STRL SZ8 (GLOVE) ×2 IMPLANT
GLOVE BIOGEL PI IND STRL 6.5 (GLOVE) IMPLANT
GLOVE BIOGEL PI IND STRL 7.0 (GLOVE) IMPLANT
GLOVE BIOGEL PI IND STRL 8 (GLOVE) ×2 IMPLANT
GOWN STRL REUS W/ TWL LRG LVL3 (GOWN DISPOSABLE) ×2 IMPLANT
GOWN STRL REUS W/ TWL XL LVL3 (GOWN DISPOSABLE) IMPLANT
GOWN STRL REUS W/TWL LRG LVL3 (GOWN DISPOSABLE) ×1
GOWN STRL REUS W/TWL XL LVL3 (GOWN DISPOSABLE)
HANDPIECE INTERPULSE COAX TIP (DISPOSABLE) ×1
HOLDER FOLEY CATH W/STRAP (MISCELLANEOUS) IMPLANT
IMMOBILIZER KNEE 20 (SOFTGOODS) ×1
IMMOBILIZER KNEE 20 THIGH 36 (SOFTGOODS) ×2 IMPLANT
KIT TURNOVER KIT A (KITS) IMPLANT
MANIFOLD NEPTUNE II (INSTRUMENTS) ×2 IMPLANT
NS IRRIG 1000ML POUR BTL (IV SOLUTION) ×2 IMPLANT
PACK TOTAL KNEE CUSTOM (KITS) ×2 IMPLANT
PADDING CAST COTTON 6X4 STRL (CAST SUPPLIES) ×4 IMPLANT
PROTECTOR NERVE ULNAR (MISCELLANEOUS) ×2 IMPLANT
SET HNDPC FAN SPRY TIP SCT (DISPOSABLE) ×2 IMPLANT
SPIKE FLUID TRANSFER (MISCELLANEOUS) ×2 IMPLANT
STRIP CLOSURE SKIN 1/2X4 (GAUZE/BANDAGES/DRESSINGS) ×4 IMPLANT
SUT MNCRL AB 4-0 PS2 18 (SUTURE) ×2 IMPLANT
SUT STRATAFIX 0 PDS 27 VIOLET (SUTURE) ×1
SUT VIC AB 2-0 CT1 27 (SUTURE) ×3
SUT VIC AB 2-0 CT1 TAPERPNT 27 (SUTURE) ×6 IMPLANT
SUTURE STRATFX 0 PDS 27 VIOLET (SUTURE) ×2 IMPLANT
TIBIAL BASE ROT PLAT SZ 5 KNEE (Knees) ×1 IMPLANT
TRAY FOLEY MTR SLVR 16FR STAT (SET/KITS/TRAYS/PACK) ×2 IMPLANT
TUBE SUCTION HIGH CAP CLEAR NV (SUCTIONS) ×2 IMPLANT
WATER STERILE IRR 1000ML POUR (IV SOLUTION) ×4 IMPLANT
WRAP KNEE MAXI GEL POST OP (GAUZE/BANDAGES/DRESSINGS) ×2 IMPLANT

## 2022-05-07 NOTE — Evaluation (Signed)
Physical Therapy Evaluation Patient Details Name: Valerie Baker MRN: [id number] DOB: [birth year] Today's Date: 05/07/2022  History of Present Illness  66 yo female s/p R TKA on 05/07/22. PMH: HTN, TKA, lumbar fusion, chronic pain, fibromyalgia, chronic pain  Clinical Impression  Pt is s/p TKA resulting in the deficits listed below (see PT Problem List).  PT amb ~6' with RW and min assist. Mobilizing well however distance limited by pain with WBing. Pt was premedicated prior to PT session. Continue to follow.  Pt will benefit from skilled PT to increase their independence and safety with mobility to allow discharge to the venue listed below.         Recommendations for follow up therapy are one component of a multi-disciplinary discharge planning process, led by the attending physician.  Recommendations may be updated based on patient status, additional functional criteria and insurance authorization.  Follow Up Recommendations Follow physician's recommendations for discharge plan and follow up therapies      Assistance Recommended at Discharge Intermittent Supervision/Assistance  Patient can return home with the following  A little help with walking and/or transfers;A little help with bathing/dressing/bathroom;Assistance with cooking/housework;Assist for transportation;Help with stairs or ramp for entrance    Equipment Recommendations Other (comment) (pt wants and UPwalker)  Recommendations for Other Services       Functional Status Assessment Patient has had a recent decline in their functional status and demonstrates the ability to make significant improvements in function in a reasonable and predictable amount of time.     Precautions / Restrictions Precautions Precautions: Knee Restrictions Weight Bearing Restrictions: No Other Position/Activity Restrictions: WBAT      Mobility  Bed Mobility Overal bed mobility: Needs Assistance Bed Mobility: Supine to Sit      Supine to sit: Supervision, HOB elevated     General bed mobility comments: for  safety, no physical assist    Transfers Overall transfer level: Needs assistance Equipment used: Rolling walker (2 wheels) Transfers: Sit to/from Stand Sit to Stand: Min assist           General transfer comment: cues for hand placement and RLE position    Ambulation/Gait Ambulation/Gait assistance: Min guard, Min assist Gait Distance (Feet): 6 Feet Assistive device: Rolling walker (2 wheels) Gait Pattern/deviations: Step-to pattern, Antalgic       General Gait Details: verbal cues for sequence and RW position. distance ltd by pain  Stairs            Wheelchair Mobility    Modified Rankin (Stroke Patients Only)       Balance                                             Pertinent Vitals/Pain Pain Assessment Pain Assessment: 0-10 Pain Score: 8  Pain Location: right knee Pain Descriptors / Indicators: Aching, Grimacing, Sore Pain Intervention(s): Monitored during session, Limited activity within patient's tolerance, Repositioned, Ice applied    Home Living Family/patient expects to be discharged to:: Private residence Living Arrangements: Spouse/significant other Available Help at Discharge: Family;Available 24 hours/day Type of Home: House Home Access: Ramped entrance       Home Layout: One level Home Equipment: Conservation officer, nature (2 wheels)      Prior Function Prior Level of Function : Independent/Modified Independent  Hand Dominance        Extremity/Trunk Assessment   Upper Extremity Assessment Upper Extremity Assessment: Overall WFL for tasks assessed ("bad shoulders" per pt)    Lower Extremity Assessment Lower Extremity Assessment: RLE deficits/detail RLE Deficits / Details: ankle WFL, knee extension and hip flexion 2+/5 RLE: Unable to fully assess due to pain       Communication   Communication: No  difficulties  Cognition Arousal/Alertness: Awake/alert Behavior During Therapy: WFL for tasks assessed/performed Overall Cognitive Status: Within Functional Limits for tasks assessed                                          General Comments      Exercises Total Joint Exercises Ankle Circles/Pumps: AROM, Both, 10 reps   Assessment/Plan    PT Assessment Patient needs continued PT services  PT Problem List Decreased strength;Decreased mobility;Decreased range of motion;Decreased activity tolerance;Decreased balance;Pain;Decreased knowledge of use of DME       PT Treatment Interventions DME instruction;Therapeutic exercise;Gait training;Therapeutic activities;Patient/family education;Functional mobility training    PT Goals (Current goals can be found in the Care Plan section)  Acute Rehab PT Goals Patient Stated Goal: home PT Goal Formulation: With patient Time For Goal Achievement: 05/14/22 Potential to Achieve Goals: Good    Frequency 7X/week     Co-evaluation               AM-PAC PT "6 Clicks" Mobility  Outcome Measure Help needed turning from your back to your side while in a flat bed without using bedrails?: A Little Help needed moving from lying on your back to sitting on the side of a flat bed without using bedrails?: A Little Help needed moving to and from a bed to a chair (including a wheelchair)?: A Little Help needed standing up from a chair using your arms (e.g., wheelchair or bedside chair)?: A Little Help needed to walk in hospital room?: A Little Help needed climbing 3-5 steps with a railing? : A Lot 6 Click Score: 17    End of Session Equipment Utilized During Treatment: Gait belt Activity Tolerance: Patient tolerated treatment well Patient left: in chair;with call bell/phone within reach;with chair alarm set;with family/visitor present Nurse Communication: Mobility status PT Visit Diagnosis: Other abnormalities of gait and  mobility (R26.89);Difficulty in walking, not elsewhere classified (R26.2)    Time: 1540-0867 PT Time Calculation (min) (ACUTE ONLY): 18 min   Charges:   PT Evaluation $PT Eval Low Complexity: [place], PT  Acute Rehab Dept [facility]) [phone]  [facility] Weekend Pager [facility] only)  [phone]  05/07/2022   [facility] 05/07/2022, 2:31 PM

## 2022-05-07 NOTE — Progress Notes (Signed)
Notified Gerald Stabs, Wellsite geologist at [employer], that patient requesting discharge medications be sent to [facility]. Physician sticky notes updated as well to reflect this. Ivan Anchors, RN 05/07/22 1:12 PM

## 2022-05-07 NOTE — Transfer of Care (Signed)
Immediate Anesthesia Transfer of Care Note  Patient: Valerie Baker  Procedure(s) Performed: TOTAL KNEE ARTHROPLASTY (Right: Knee)  Patient Location: PACU  Anesthesia Type:General  Level of Consciousness: oriented, drowsy and patient cooperative  Airway & Oxygen Therapy: Patient Spontanous Breathing and Patient connected to face mask oxygen  Post-op Assessment: Report given to RN and Post -op Vital signs reviewed and stable  Post vital signs: Reviewed and stable  Last Vitals:  Vitals Value Taken Time  BP 174/92 05/07/22 0903  Temp    Pulse 65 05/07/22 0905  Resp 11 05/07/22 0905  SpO2 100 % 05/07/22 0905  Vitals shown include unvalidated device data.  Last Pain:  Vitals:   05/07/22 0548  TempSrc:   PainSc: 7       Patients Stated Pain Goal: 5 (60/02/98 4730)  Complications: No notable events documented.

## 2022-05-07 NOTE — Op Note (Signed)
OPERATIVE REPORT-TOTAL KNEE ARTHROPLASTY   Pre-operative diagnosis- Osteoarthritis  Right knee(s)  Post-operative diagnosis- Osteoarthritis Right knee(s)  Procedure-  Right  Total Knee Arthroplasty  Surgeon- Dione Plover. Tanmay Halteman, MD  Assistant- Jaynie Bream, PA-C   Anesthesia-  General and Regional  EBL-25 ml   Drains None  Tourniquet time-  Total Tourniquet Time Documented: Thigh (Right) - 36 minutes Total: Thigh (Right) - 36 minutes     Complications- None  Condition-PACU - hemodynamically stable.   Brief Clinical Note  Valerie PUNCHES is a 66 y.o. year old female with end stage OA of his right knee with progressively worsening pain and dysfunction. He has constant pain, with activity and at rest and significant functional deficits with difficulties even with ADLs. He has had extensive non-op management including analgesics, injections of cortisone and viscosupplements, and home exercise program, but remains in significant pain with significant dysfunction. Radiographs show bone on bone arthritis medial and patellofemoral. He presents now for right Total Knee Arthroplasty.     Procedure in detail---   The patient is brought into the operating room and positioned supine on the operating table. After successful administration of  General and Regional,   a tourniquet is placed high on the  Right thigh(s) and the lower extremity is prepped and draped in the usual sterile fashion. Time out is performed by the operating team and then the  Right lower extremity is wrapped in Esmarch, knee flexed and the tourniquet inflated to 300 mmHg.       A midline incision is made with a ten blade through the subcutaneous tissue to the level of the extensor mechanism. A fresh blade is used to make a medial parapatellar arthrotomy. Soft tissue over the proximal medial tibia is subperiosteally elevated to the joint line with a knife and into the semimembranosus bursa with a Cobb elevator. Soft  tissue over the proximal lateral tibia is elevated with attention being paid to avoiding the patellar tendon on the tibial tubercle. The patella is everted, knee flexed 90 degrees and the ACL and PCL are removed. Findings are bone on bone medial and patellofemoral  with large global osteophytes      The drill is used to create a starting hole in the distal femur and the canal is thoroughly irrigated with sterile saline to remove the fatty contents. The 5 degree Right  valgus alignment guide is placed into the femoral canal and the distal femoral cutting block is pinned to remove 9 mm off the distal femur. Resection is made with an oscillating saw.      The tibia is subluxed forward and the menisci are removed. The extramedullary alignment guide is placed referencing proximally at the medial aspect of the tibial tubercle and distally along the second metatarsal axis and tibial crest. The block is pinned to remove 60m off the more deficient medial  side. Resection is made with an oscillating saw. Size 5is the most appropriate size for the tibia and the proximal tibia is prepared with the modular drill and keel punch for that size.      The femoral sizing guide is placed and size 5 is most appropriate. Rotation is marked off the epicondylar axis and confirmed by creating a rectangular flexion gap at 90 degrees. The size 5 cutting block is pinned in this rotation and the anterior, posterior and chamfer cuts are made with the oscillating saw. The intercondylar block is then placed and that cut is made.  Trial size 5 tibial component, trial size 5 posterior stabilized femur and a 8  mm posterior stabilized rotating platform insert trial is placed. Full extension is achieved with excellent varus/valgus and anterior/posterior balance throughout full range of motion. The patella is everted and thickness measured to be 22  mm. Free hand resection is taken to 12 mm, a 35 template is placed, lug holes are drilled, trial  patella is placed, and it tracks normally. Osteophytes are removed off the posterior femur with the trial in place. All trials are removed and the cut bone surfaces prepared with pulsatile lavage. Cement is mixed and once ready for implantation, the size 5 tibial implant, size  5 posterior stabilized femoral component, and the size 35 patella are cemented in place and the patella is held with the clamp. The trial insert is placed and the knee held in full extension. The Exparel (20 ml mixed with 60 ml saline) is injected into the extensor mechanism, posterior capsule, medial and lateral gutters and subcutaneous tissues.  All extruded cement is removed and once the cement is hard the permanent 8 mm posterior stabilized rotating platform insert is placed into the tibial tray.      The wound is copiously irrigated with saline solution and the extensor mechanism closed with # 0 Stratofix suture. The tourniquet is released for a total tourniquet time of 36  minutes. Flexion against gravity is 140 degrees and the patella tracks normally. Subcutaneous tissue is closed with 2.0 vicryl and subcuticular with running 4.0 Monocryl. The incision is cleaned and dried and steri-strips and a bulky sterile dressing are applied. The limb is placed into a knee immobilizer and the patient is awakened and transported to recovery in stable condition.      Please note that a surgical assistant was a medical necessity for this procedure in order to perform it in a safe and expeditious manner. Surgical assistant was necessary to retract the ligaments and vital neurovascular structures to prevent injury to them and also necessary for proper positioning of the limb to allow for anatomic placement of the prosthesis.   Dione Plover Zala Degrasse, MD    05/07/2022, 8:33 AM

## 2022-05-07 NOTE — Care Plan (Signed)
Ortho Bundle Case Management Note  Patient Details  Name: Valerie Baker MRN: [id number] Date of Birth: 08-15-1955                  R TKA on 05-07-22 DCP: Home with husband DME: No needs, has a RW PT: [facility] on 05-10-22   DME Arranged:  N/A DME Agency:     HH Arranged:    [place] Agency:     Additional Comments: Please contact me with any questions of if this plan should need to change.  Marianne Sofia, RN,CCM [facility]  [phone] 05/07/2022, 3:29 PM

## 2022-05-07 NOTE — Progress Notes (Signed)
Orthopedic Tech Progress Note Patient Details:  Valerie Baker DONE 10-20-1955 [id number]  Patient ID: Candis Musa, female   DOB: 04-15-1956, 66 y.o.   MRN: [id number] New Cpm padding applied. [facility] 05/07/2022, 11:07 AM

## 2022-05-07 NOTE — Progress Notes (Signed)
Orthopedic Tech Progress Note Patient Details:  Valerie Baker [birth year] [id number]  CPM Right Knee CPM Right Knee: On Right Knee Flexion (Degrees): 40 Right Knee Extension (Degrees): 10  Post Interventions Patient Tolerated: Well  [name] 05/07/2022, 9:33 AM

## 2022-05-07 NOTE — Interval H&P Note (Signed)
History and Physical Interval Note:  05/07/2022 6:30 AM  Valerie Baker  has presented today for surgery, with the diagnosis of right knee osteoarthritis.  The various methods of treatment have been discussed with the patient and family. After consideration of risks, benefits and other options for treatment, the patient has consented to  Procedure(s): TOTAL KNEE ARTHROPLASTY (Right) as a surgical intervention.  The patient's history has been reviewed, patient examined, no change in status, stable for surgery.  I have reviewed the patient's chart and labs.  Questions were answered to the patient's satisfaction.     Pilar Plate Ordean Fouts

## 2022-05-07 NOTE — Anesthesia Procedure Notes (Signed)
Procedure Name: LMA Insertion Date/Time: 05/07/2022 7:30 AM  Performed by: Garrel Ridgel, CRNAPre-anesthesia Checklist: Patient identified, Emergency Drugs available, Suction available and Patient being monitored Patient Re-evaluated:Patient Re-evaluated prior to induction Oxygen Delivery Method: Circle system utilized Preoxygenation: Pre-oxygenation with 100% oxygen Induction Type: IV induction Ventilation: Mask ventilation without difficulty LMA: LMA with gastric port inserted LMA Size: 4.0 Tube type: Oral Number of attempts: 1 Airway Equipment and Method: Stylet and Oral airway Placement Confirmation: positive ETCO2 and breath sounds checked- equal and bilateral Tube secured with: Tape Dental Injury: Teeth and Oropharynx as per pre-operative assessment

## 2022-05-07 NOTE — Anesthesia Procedure Notes (Signed)
Anesthesia Regional Block: Adductor canal block   Pre-Anesthetic Checklist: , timeout performed,  Correct Patient, Correct Site, Correct Laterality,  Correct Procedure, Correct Position, site marked,  Risks and benefits discussed,  Surgical consent,  Pre-op evaluation,  At surgeon's request and post-op pain management  Laterality: Right  Prep: chloraprep       Needles:  Injection technique: Single-shot  Needle Type: Stimiplex     Needle Length: 9cm  Needle Gauge: 21     Additional Needles:   Procedures:,,,, ultrasound used (permanent image in chart),,    Narrative:  Start time: 05/07/2022 7:08 AM End time: 05/07/2022 7:13 AM Injection made incrementally with aspirations every 5 mL.  Performed by: Personally  Anesthesiologist: Lynda Rainwater, MD

## 2022-05-07 NOTE — Anesthesia Postprocedure Evaluation (Signed)
Anesthesia Post Note  Patient: Valerie Baker  Procedure(s) Performed: TOTAL KNEE ARTHROPLASTY (Right: Knee)     Patient location during evaluation: PACU Anesthesia Type: Regional and General Level of consciousness: awake and alert Pain management: pain level controlled Vital Signs Assessment: post-procedure vital signs reviewed and stable Respiratory status: spontaneous breathing, nonlabored ventilation and respiratory function stable Cardiovascular status: blood pressure returned to baseline and stable Postop Assessment: no apparent nausea or vomiting Anesthetic complications: no   No notable events documented.  Last Vitals:  Vitals:   05/07/22 1000 05/07/22 1015  BP: (!) 163/86 (!) 167/83  Pulse: 61 63  Resp: 10 10  Temp:  (!) 36.4 C  SpO2: 100% 100%    Last Pain:  Vitals:   05/07/22 1015  TempSrc:   PainSc: Asleep                 Lynda Rainwater

## 2022-05-07 NOTE — Discharge Instructions (Signed)
 Frank Aluisio, MD Total Joint Specialist [facility] [place] [address] [place], [place] [address] [phone]  TOTAL KNEE REPLACEMENT POSTOPERATIVE DIRECTIONS    Knee Rehabilitation, Guidelines Following Surgery  Results after knee surgery are often greatly improved when you follow the exercise, range of motion and muscle strengthening exercises prescribed by your doctor. Safety measures are also important to protect the knee from further injury. If any of these exercises cause you to have increased pain or swelling in your knee joint, decrease the amount until you are comfortable again and slowly increase them. If you have problems or questions, call your caregiver or physical therapist for advice.   BLOOD CLOT PREVENTION Take a 325 mg Aspirin two times a day for three weeks following surgery. Then take an 81 mg Aspirin once a day for three weeks. Then discontinue Aspirin. You may resume your vitamins/supplements upon discharge from the hospital. Do not take any NSAIDs (Advil, Aleve, Ibuprofen, Meloxicam, etc.) until you have discontinued the 325 mg Aspirin.  HOME CARE INSTRUCTIONS  Remove items at home which could result in a fall. This includes throw rugs or furniture in walking pathways.  ICE to the affected knee as much as tolerated. Icing helps control swelling. If the swelling is well controlled you will be more comfortable and rehab easier. Continue to use ice on the knee for pain and swelling from surgery. You may notice swelling that will progress down to the foot and ankle. This is normal after surgery. Elevate the leg when you are not up walking on it.    Continue to use the breathing machine which will help keep your temperature down. It is common for your temperature to cycle up and down following surgery, especially at night when you are not up moving around and exerting yourself. The breathing machine keeps your lungs expanded and your temperature  down. Do not place pillow under the operative knee, focus on keeping the knee straight while resting  DIET You may resume your previous home diet once you are discharged from the hospital.  DRESSING / WOUND CARE / SHOWERING Keep your bulky bandage on for 2 days. On the third post-operative day you may remove the Ace bandage and gauze. There is a waterproof adhesive bandage on your skin which will stay in place until your first follow-up appointment. Once you remove this you will not need to place another bandage You may begin showering 3 days following surgery, but do not submerge the incision under water.  ACTIVITY For the first 5 days, the key is rest and control of pain and swelling Do your home exercises twice a day starting on post-operative day 3. On the days you go to physical therapy, just do the home exercises once that day. You should rest, ice and elevate the leg for 50 minutes out of every hour. Get up and walk/stretch for 10 minutes per hour. After 5 days you can increase your activity slowly as tolerated. Walk with your walker as instructed. Use the walker until you are comfortable transitioning to a cane. Walk with the cane in the opposite hand of the operative leg. You may discontinue the cane once you are comfortable and walking steadily. Avoid periods of inactivity such as sitting longer than an hour when not asleep. This helps prevent blood clots.  You may discontinue the knee immobilizer once you are able to perform a straight leg raise while lying down. You may resume a sexual relationship in one month   or when given the OK by your doctor.  You may return to work once you are cleared by your doctor.  Do not drive a car for 6 weeks or until released by your surgeon.  Do not drive while taking narcotics.  TED HOSE STOCKINGS Wear the elastic stockings on both legs for three weeks following surgery during the day. You may remove them at night for sleeping.  WEIGHT  BEARING Weight bearing as tolerated with assist device (walker, cane, etc) as directed, use it as long as suggested by your surgeon or therapist, typically at least 4-6 weeks.  POSTOPERATIVE CONSTIPATION PROTOCOL Constipation - defined medically as fewer than three stools per week and severe constipation as less than one stool per week.  One of the most common issues patients have following surgery is constipation.  Even if you have a regular bowel pattern at home, your normal regimen is likely to be disrupted due to multiple reasons following surgery.  Combination of anesthesia, postoperative narcotics, change in appetite and fluid intake all can affect your bowels.  In order to avoid complications following surgery, here are some recommendations in order to help you during your recovery period.  Colace (docusate) - Pick up an over-the-counter form of Colace or another stool softener and take twice a day as long as you are requiring postoperative pain medications.  Take with a full glass of water daily.  If you experience loose stools or diarrhea, hold the colace until you stool forms back up. If your symptoms do not get better within 1 week or if they get worse, check with your doctor. Dulcolax (bisacodyl) - Pick up over-the-counter and take as directed by the product packaging as needed to assist with the movement of your bowels.  Take with a full glass of water.  Use this product as needed if not relieved by Colace only.  MiraLax (polyethylene glycol) - Pick up over-the-counter to have on hand. MiraLax is a solution that will increase the amount of water in your bowels to assist with bowel movements.  Take as directed and can mix with a glass of water, juice, soda, coffee, or tea. Take if you go more than two days without a movement. Do not use MiraLax more than once per day. Call your doctor if you are still constipated or irregular after using this medication for 7 days in a row.  If you continue  to have problems with postoperative constipation, please contact the office for further assistance and recommendations.  If you experience "the worst abdominal pain ever" or develop nausea or vomiting, please contact the office immediatly for further recommendations for treatment.  ITCHING If you experience itching with your medications, try taking only a single pain pill, or even half a pain pill at a time.  You can also use Benadryl over the counter for itching or also to help with sleep.   MEDICATIONS See your medication summary on the "After Visit Summary" that the nursing staff will review with you prior to discharge.  You may have some home medications which will be placed on hold until you complete the course of blood thinner medication.  It is important for you to complete the blood thinner medication as prescribed by your surgeon.  Continue your approved medications as instructed at time of discharge.  PRECAUTIONS If you experience chest pain or shortness of breath - call 911 immediately for transfer to the hospital emergency department.  If you develop a fever greater that 101 F,   purulent drainage from wound, increased redness or drainage from wound, foul odor from the wound/dressing, or calf pain - CONTACT YOUR SURGEON.                                                   FOLLOW-UP APPOINTMENTS Make sure you keep all of your appointments after your operation with your surgeon and caregivers. You should call the office at the above phone number and make an appointment for approximately two weeks after the date of your surgery or on the date instructed by your surgeon outlined in the "After Visit Summary".  RANGE OF MOTION AND STRENGTHENING EXERCISES  Rehabilitation of the knee is important following a knee injury or an operation. After just a few days of immobilization, the muscles of the thigh which control the knee become weakened and shrink (atrophy). Knee exercises are designed to build up  the tone and strength of the thigh muscles and to improve knee motion. Often times heat used for twenty to thirty minutes before working out will loosen up your tissues and help with improving the range of motion but do not use heat for the first two weeks following surgery. These exercises can be done on a training (exercise) mat, on the floor, on a table or on a bed. Use what ever works the best and is most comfortable for you Knee exercises include:  Leg Lifts - While your knee is still immobilized in a splint or cast, you can do straight leg raises. Lift the leg to 60 degrees, hold for 3 sec, and slowly lower the leg. Repeat 10-20 times 2-3 times daily. Perform this exercise against resistance later as your knee gets better.  Quad and Hamstring Sets - Tighten up the muscle on the front of the thigh (Quad) and hold for 5-10 sec. Repeat this 10-20 times hourly. Hamstring sets are done by pushing the foot backward against an object and holding for 5-10 sec. Repeat as with quad sets.  Leg Slides: Lying on your back, slowly slide your foot toward your buttocks, bending your knee up off the floor (only go as far as is comfortable). Then slowly slide your foot back down until your leg is flat on the floor again. Angel Wings: Lying on your back spread your legs to the side as far apart as you can without causing discomfort.  A rehabilitation program following serious knee injuries can speed recovery and prevent re-injury in the future due to weakened muscles. Contact your doctor or a physical therapist for more information on knee rehabilitation.   POST-OPERATIVE OPIOID TAPER INSTRUCTIONS: It is important to wean off of your opioid medication as soon as possible. If you do not need pain medication after your surgery it is ok to stop day one. Opioids include: Codeine, Hydrocodone(Norco, Vicodin), Oxycodone(Percocet, oxycontin) and hydromorphone amongst others.  Long term and even short term use of opiods can  cause: Increased pain response Dependence Constipation Depression Respiratory depression And more.  Withdrawal symptoms can include Flu like symptoms Nausea, vomiting And more Techniques to manage these symptoms Hydrate well Eat regular healthy meals Stay active Use relaxation techniques(deep breathing, meditating, yoga) Do Not substitute Alcohol to help with tapering If you have been on opioids for less than two weeks and do not have pain than it is ok to stop all together.  Plan   to wean off of opioids This plan should start within one week post op of your joint replacement. Maintain the same interval or time between taking each dose and first decrease the dose.  Cut the total daily intake of opioids by one tablet each day Next start to increase the time between doses. The last dose that should be eliminated is the evening dose.   IF YOU ARE TRANSFERRED TO A SKILLED REHAB FACILITY If the patient is transferred to a skilled rehab facility following release from the hospital, a list of the current medications will be sent to the facility for the patient to continue.  When discharged from the skilled rehab facility, please have the facility set up the patient's Home Health Physical Therapy prior to being released. Also, the skilled facility will be responsible for providing the patient with their medications at time of release from the facility to include their pain medication, the muscle relaxants, and their blood thinner medication. If the patient is still at the rehab facility at time of the two week follow up appointment, the skilled rehab facility will also need to assist the patient in arranging follow up appointment in our office and any transportation needs.  MAKE SURE YOU:  Understand these instructions.  Get help right away if you are not doing well or get worse.   DENTAL ANTIBIOTICS:  In most cases prophylactic antibiotics for Dental procdeures after total joint surgery are  not necessary.  Exceptions are as follows:  1. History of prior total joint infection  2. Severely immunocompromised (Organ Transplant, cancer chemotherapy, Rheumatoid biologic medications such as Humera)  3. Poorly controlled diabetes (A1C &gt; 8.0, blood glucose over 200)  If you have one of these conditions, contact your surgeon for an antibiotic prescription, prior to your dental procedure.    Pick up stool softner and laxative for home use following surgery while on pain medications. Do not submerge incision under water. Please use good hand washing techniques while changing dressing each day. May shower starting three days after surgery. Please use a clean towel to pat the incision dry following showers. Continue to use ice for pain and swelling after surgery. Do not use any lotions or creams on the incision until instructed by your surgeon.  

## 2022-05-07 NOTE — Progress Notes (Signed)
Orthopedic Tech Progress Note Patient Details:  Valerie Baker [birth year] [id number] Patient placed back into 0-40 CPM  Patient ID: Candis Musa, female   DOB: April 17, 1956, 66 y.o.   MRN: [id number]  Jearld Lesch 05/07/2022, 3:16 PM

## 2022-05-07 NOTE — Anesthesia Preprocedure Evaluation (Addendum)
Anesthesia Evaluation  Patient identified by MRN, date of birth, ID band Patient awake    Reviewed: Allergy & Precautions, NPO status , Patient's Chart, lab work & pertinent test results  History of Anesthesia Complications (+) PONV and history of anesthetic complications  Airway Mallampati: II  TM Distance: >3 FB Neck ROM: Full    Dental no notable dental hx.    Pulmonary asthma , former smoker,    Pulmonary exam normal breath sounds clear to auscultation       Cardiovascular hypertension, Pt. on medications Normal cardiovascular exam Rhythm:Regular Rate:Normal     Neuro/Psych Anxiety Depression negative neurological ROS  negative psych ROS   GI/Hepatic Neg liver ROS, GERD  ,  Endo/Other  negative endocrine ROS  Renal/GU negative Renal ROS  negative genitourinary   Musculoskeletal  (+) Arthritis , Osteoarthritis,  Fibromyalgia -  Abdominal (+) + obese,   Peds negative pediatric ROS (+)  Hematology negative hematology ROS (+)   Anesthesia Other Findings   Reproductive/Obstetrics negative OB ROS                             Anesthesia Physical Anesthesia Plan  ASA: 2  Anesthesia Plan: General   Post-op Pain Management: Minimal or no pain anticipated and Regional block*   Induction: Intravenous  PONV Risk Score and Plan: 4 or greater and Ondansetron, Dexamethasone, Midazolam, Treatment may vary due to age or medical condition and Scopolamine patch - Pre-op  Airway Management Planned: LMA  Additional Equipment:   Intra-op Plan:   Post-operative Plan: Extubation in OR  Informed Consent: I have reviewed the patients History and Physical, chart, labs and discussed the procedure including the risks, benefits and alternatives for the proposed anesthesia with the patient or authorized representative who has indicated his/her understanding and acceptance.     Dental advisory  given  Plan Discussed with: CRNA  Anesthesia Plan Comments:        Anesthesia Quick Evaluation

## 2022-05-08 ENCOUNTER — Encounter (HOSPITAL_COMMUNITY): Payer: Self-pay | Admitting: Orthopedic Surgery

## 2022-05-08 DIAGNOSIS — Z96659 Presence of unspecified artificial knee joint: Secondary | ICD-10-CM | POA: Diagnosis not present

## 2022-05-08 DIAGNOSIS — Z91040 Latex allergy status: Secondary | ICD-10-CM | POA: Diagnosis not present

## 2022-05-08 DIAGNOSIS — I10 Essential (primary) hypertension: Secondary | ICD-10-CM | POA: Diagnosis not present

## 2022-05-08 DIAGNOSIS — Z87891 Personal history of nicotine dependence: Secondary | ICD-10-CM | POA: Diagnosis not present

## 2022-05-08 DIAGNOSIS — J45909 Unspecified asthma, uncomplicated: Secondary | ICD-10-CM | POA: Diagnosis not present

## 2022-05-08 DIAGNOSIS — M1711 Unilateral primary osteoarthritis, right knee: Secondary | ICD-10-CM | POA: Diagnosis not present

## 2022-05-08 DIAGNOSIS — Z79899 Other long term (current) drug therapy: Secondary | ICD-10-CM | POA: Diagnosis not present

## 2022-05-08 LAB — CBC
HCT: 30.7 % — ABNORMAL LOW (ref 36.0–46.0)
Hemoglobin: 9.7 g/dL — ABNORMAL LOW (ref 12.0–15.0)
MCH: 31.3 pg (ref 26.0–34.0)
MCHC: 31.6 g/dL (ref 30.0–36.0)
MCV: 99 fL (ref 80.0–100.0)
Platelets: 156 10*3/uL (ref 150–400)
RBC: 3.1 MIL/uL — ABNORMAL LOW (ref 3.87–5.11)
RDW: 12.9 % (ref 11.5–15.5)
WBC: 8.7 10*3/uL (ref 4.0–10.5)
nRBC: 0 % (ref 0.0–0.2)

## 2022-05-08 LAB — BASIC METABOLIC PANEL
Anion gap: 5 (ref 5–15)
BUN: 14 mg/dL (ref 8–23)
CO2: 30 mmol/L (ref 22–32)
Calcium: 8.3 mg/dL — ABNORMAL LOW (ref 8.9–10.3)
Chloride: 106 mmol/L (ref 98–111)
Creatinine, Ser: 0.73 mg/dL (ref 0.44–1.00)
GFR, Estimated: >60 mL/min (ref >60)
Glucose, Bld: 113 mg/dL — ABNORMAL HIGH (ref 70–99)
Potassium: 4.1 mmol/L (ref 3.5–5.1)
Sodium: 141 mmol/L (ref 135–145)

## 2022-05-08 MED ORDER — SODIUM CHLORIDE 0.9 % IV BOLUS
250.0000 mL | Freq: Once | INTRAVENOUS | Status: AC
  Administered 2022-05-08: 250 mL via INTRAVENOUS

## 2022-05-08 NOTE — Progress Notes (Signed)
   Subjective: 1 Day Post-Op Procedure(s) (LRB): TOTAL KNEE ARTHROPLASTY (Right) Patient reports pain as moderate.   Patient seen in rounds by Dr. Wynelle Link. Patient is well, and has had no acute complaints or problems other than pain in the right knee. No acute events overnight. We will continue therapy today.  Objective: Vital signs in last 24 hours: Temp:  [97.5 F (36.4 C)-98 F (36.7 C)] 97.8 F (36.6 C) (09/26 0528) Pulse Rate:  [58-75] 61 (09/26 0536) Resp:  [10-17] 17 (09/26 0528) BP: (109-181)/(49-119) 117/53 (09/26 0536) SpO2:  [94 %-100 %] 98 % (09/26 0528)  Intake/Output from previous day:  Intake/Output Summary (Last 24 hours) at 05/08/2022 0828 Last data filed at 05/08/2022 0600 Gross per 24 hour  Intake 4205.24 ml  Output 2830 ml  Net 1375.24 ml     Intake/Output this shift: No intake/output data recorded.  Labs: No results for input(s): "HGB" in the last 72 hours. No results for input(s): "WBC", "RBC", "HCT", "PLT" in the last 72 hours. Recent Labs    05/08/22 0347  NA 141  K 4.1  CL 106  CO2 30  BUN 14  CREATININE 0.73  GLUCOSE 113*  CALCIUM 8.3*   No results for input(s): "LABPT", "INR" in the last 72 hours.  Exam: General - Patient is Alert and Oriented Extremity - Neurologically intact Neurovascular intact Sensation intact distally Dorsiflexion/Plantar flexion intact Dressing - dressing C/D/I Motor Function - intact, moving foot and toes well on exam.   Past Medical History:  Diagnosis Date   Adenomatous colon polyp 06/13/2006   Allergy    SINUS   Anxiety    Anxiety and depression    Arthritis    Asthma    Bladder incontinence    Blood transfusion    Cataract    Depression    Fibromyalgia    GERD (gastroesophageal reflux disease)    History of kidney stones    Hyperlipidemia    Kidney stones    Obesity    Osteopenia    Post-operative nausea and vomiting     Assessment/Plan: 1 Day Post-Op Procedure(s) (LRB): TOTAL KNEE  ARTHROPLASTY (Right) Principal Problem:   OA (osteoarthritis) of knee Active Problems:   Osteoarthritis of right knee  Estimated body mass index is 39.49 kg/m as calculated from the following:   Height as of this encounter: '5\' 1"'$  (1.549 m).   Weight as of this encounter: 94.8 kg. Advance diet Up with therapy  Anticipated LOS equal to or greater than 2 midnights due to - Age 66 and older with one or more of the following:  - Obesity  - Expected need for hospital services (PT, OT, Nursing) required for safe  discharge  - Anticipated need for postoperative skilled nursing care or inpatient rehab  - Active co-morbidities: Chronic pain requiring opiods OR   - Unanticipated findings during/Post Surgery: None  - Patient is a high risk of re-admission due to: None   DVT Prophylaxis - Aspirin Weight bearing as tolerated. Continue therapy.  Plan is to go Home after hospital stay. Postoperative pain management will be complicated by chronic opioid use (Percocet 7.5 BID). Higher levels of pain this morning, but to be expected. Will require an additional night stay to ensure adequate pain control prior to discharge.  Theresa Duty, PA-C Orthopedic Surgery [phone] 05/08/2022, 8:28 AM

## 2022-05-08 NOTE — Progress Notes (Signed)
Orthopedic Tech Progress Note Patient Details:  Valerie Baker [birth year] [id number]  Patient ID: Candis Musa, female   DOB: 19-Mar-1956, 66 y.o.   MRN: [id number]  Kennis Carina 05/08/2022, 5:30 PM Patient placed in cpm

## 2022-05-08 NOTE — Progress Notes (Signed)
Orthopedic Tech Progress Note Patient Details:  RORI GOAR January 15, 1956 [id number]  Patient ID: Candis Musa, female   DOB: 05/07/1956, 66 y.o.   MRN: [id number]  Kennis Carina 05/08/2022, 7:52 PM Cpm removed

## 2022-05-08 NOTE — Progress Notes (Signed)
   05/08/22 1700  PT Visit Information  Last PT Received On 05/08/22  Assistance Needed   Pt is progressing, exercise focused session this pm which pt tol well; will continue efforts to incr mobility in preparation for d/c next day or as able.  History of Present Illness 66 yo female s/p R TKA on 05/07/22. PMH: HTN, TKA, lumbar fusion, chronic pain, fibromyalgia, chronic pain  Subjective Data  Patient Stated Goal home  Precautions  Precautions Knee;Fall  Required Braces or Orthoses Knee Immobilizer - Right  Knee Immobilizer - Right Discontinue once straight leg raise with < 10 degree lag  Restrictions  RLE Weight Bearing WBAT  Pain Assessment  Pain Assessment 0-10  Pain Score 5  Pain Location right knee  Pain Descriptors / Indicators Aching;Grimacing;Sore  Pain Intervention(s) Limited activity within patient's tolerance;Ice applied  Cognition  Arousal/Alertness Awake/alert  Behavior During Therapy WFL for tasks assessed/performed  Overall Cognitive Status Within Functional Limits for tasks assessed  Total Joint Exercises  Ankle Circles/Pumps AROM;Both;10 reps  Quad Sets AROM;Both;10 reps  Heel Slides AAROM;Right;10 reps  Hip ABduction/ADduction AROM;AAROM;Right;10 reps  Straight Leg Raises AAROM;Right;AROM;10 reps  Goniometric ROM grossly 15 to 60 degrees right knee flexion  PT - End of Session  Equipment Utilized During Treatment Gait belt  Activity Tolerance Patient tolerated treatment well  Patient left with call bell/phone within reach;in bed;with bed alarm set;with family/visitor present  Nurse Communication Mobility status   PT - Assessment/Plan  PT Plan Current plan remains appropriate  PT Visit Diagnosis Other abnormalities of gait and mobility (R26.89);Difficulty in walking, not elsewhere classified (R26.2)  PT Frequency (ACUTE ONLY) 7X/week  Follow Up Recommendations Follow physician's recommendations for discharge plan and follow up therapies  Assistance  recommended at discharge Intermittent Supervision/Assistance  Patient can return home with the following A little help with walking and/or transfers;A little help with bathing/dressing/bathroom;Assistance with cooking/housework;Assist for transportation;Help with stairs or ramp for entrance  PT equipment Other (comment)  AM-PAC PT "6 Clicks" Mobility Outcome Measure (Version 2)  Help needed turning from your back to your side while in a flat bed without using bedrails? 3  Help needed moving from lying on your back to sitting on the side of a flat bed without using bedrails? 3  Help needed moving to and from a bed to a chair (including a wheelchair)? 3  Help needed standing up from a chair using your arms (e.g., wheelchair or bedside chair)? 3  Help needed to walk in hospital room? 3  6 Click Score 15  Consider Recommendation of Discharge To: CIR/SNF/LTACH  PT Goal Progression  Progress towards PT goals Progressing toward goals  Acute Rehab PT Goals  PT Goal Formulation With patient  Time For Goal Achievement 05/14/22  Potential to Achieve Goals Good  PT Time Calculation  PT Start Time (ACUTE ONLY) 1658  PT Stop Time (ACUTE ONLY) 1718  PT Time Calculation (min) (ACUTE ONLY) 20 min  PT General Charges  $$ ACUTE PT VISIT 1 Visit  PT Treatments  $Therapeutic Exercise 8-22 mins

## 2022-05-08 NOTE — Progress Notes (Signed)
Physical Therapy Treatment Patient Details Name: Valerie Baker MRN: [id number] DOB: 1956/05/14 Today's Date: 05/08/2022   History of Present Illness 66 yo female s/p R TKA on 05/07/22. PMH: HTN, TKA, lumbar fusion, chronic pain, fibromyalgia, chronic pain    PT Comments    Pt progressing however having issues with pain. Pt may need another day to work on pain control and incr mobility/safety. Will see how she does this pm.    Recommendations for follow up therapy are one component of a multi-disciplinary discharge planning process, led by the attending physician.  Recommendations may be updated based on patient status, additional functional criteria and insurance authorization.  Follow Up Recommendations  Follow physician's recommendations for discharge plan and follow up therapies     Assistance Recommended at Discharge Intermittent Supervision/Assistance  Patient can return home with the following A little help with walking and/or transfers;A little help with bathing/dressing/bathroom;Assistance with cooking/housework;Assist for transportation;Help with stairs or ramp for entrance   Equipment Recommendations  Other (comment) (?new RW)    Recommendations for Other Services       Precautions / Restrictions Precautions Precautions: Knee;Fall Required Braces or Orthoses: Knee Immobilizer - Right Knee Immobilizer - Right: Discontinue once straight leg raise with < 10 degree lag Restrictions RLE Weight Bearing: Weight bearing as tolerated     Mobility  Bed Mobility Overal bed mobility: Needs Assistance Bed Mobility: Supine to Sit     Supine to sit: Min guard     General bed mobility comments: for  safety, no physical assist    Transfers Overall transfer level: Needs assistance Equipment used: Rolling walker (2 wheels) Transfers: Sit to/from Stand Sit to Stand: Min assist           General transfer comment: cues for hand placement and RLE position     Ambulation/Gait Ambulation/Gait assistance: Min guard, Min assist Gait Distance (Feet): 30 Feet Assistive device: Rolling walker (2 wheels) Gait Pattern/deviations: Step-to pattern, Antalgic       General Gait Details: verbal cues for sequence and RW position. distance ltd by pain and fatigue   Stairs             Wheelchair Mobility    Modified Rankin (Stroke Patients Only)       Balance                                            Cognition Arousal/Alertness: Awake/alert Behavior During Therapy: WFL for tasks assessed/performed Overall Cognitive Status: Within Functional Limits for tasks assessed                                          Exercises      General Comments        Pertinent Vitals/Pain Pain Assessment Pain Assessment: 0-10 Pain Score: 7  Pain Location: right knee Pain Descriptors / Indicators: Aching, Grimacing, Sore Pain Intervention(s): Limited activity within patient's tolerance, Monitored during session, Premedicated before session, Repositioned    Home Living                          Prior Function            PT Goals (current goals can now be found in the care plan section)  Acute Rehab PT Goals Patient Stated Goal: home PT Goal Formulation: With patient Time For Goal Achievement: 05/14/22 Potential to Achieve Goals: Good Progress towards PT goals: Progressing toward goals    Frequency    7X/week      PT Plan Current plan remains appropriate    Co-evaluation              AM-PAC PT "6 Clicks" Mobility   Outcome Measure  Help needed turning from your back to your side while in a flat bed without using bedrails?: A Little Help needed moving from lying on your back to sitting on the side of a flat bed without using bedrails?: None Help needed moving to and from a bed to a chair (including a wheelchair)?: A Little Help needed standing up from a chair using your arms  (e.g., wheelchair or bedside chair)?: A Little Help needed to walk in hospital room?: A Little Help needed climbing 3-5 steps with a railing? : A Little 6 Click Score: 19    End of Session Equipment Utilized During Treatment: Gait belt Activity Tolerance: Patient tolerated treatment well Patient left: in chair;with call bell/phone within reach;with chair alarm set;with family/visitor present Nurse Communication: Mobility status PT Visit Diagnosis: Other abnormalities of gait and mobility (R26.89);Difficulty in walking, not elsewhere classified (R26.2)     Time: 4970-2637 PT Time Calculation (min) (ACUTE ONLY): 20 min  Charges:  $Gait Training: 8-22 mins                     Baxter Flattery, PT  Acute Rehab Dept [facility]) [phone]  [facility] Weekend Pager [facility] only)  [phone]  05/08/2022    [facility] 05/08/2022, 3:00 PM

## 2022-05-09 DIAGNOSIS — I10 Essential (primary) hypertension: Secondary | ICD-10-CM | POA: Diagnosis not present

## 2022-05-09 DIAGNOSIS — Z79899 Other long term (current) drug therapy: Secondary | ICD-10-CM | POA: Diagnosis not present

## 2022-05-09 DIAGNOSIS — M1711 Unilateral primary osteoarthritis, right knee: Secondary | ICD-10-CM | POA: Diagnosis not present

## 2022-05-09 DIAGNOSIS — Z87891 Personal history of nicotine dependence: Secondary | ICD-10-CM | POA: Diagnosis not present

## 2022-05-09 DIAGNOSIS — J45909 Unspecified asthma, uncomplicated: Secondary | ICD-10-CM | POA: Diagnosis not present

## 2022-05-09 DIAGNOSIS — Z96659 Presence of unspecified artificial knee joint: Secondary | ICD-10-CM | POA: Diagnosis not present

## 2022-05-09 DIAGNOSIS — Z91040 Latex allergy status: Secondary | ICD-10-CM | POA: Diagnosis not present

## 2022-05-09 LAB — CBC
HCT: 30.3 % — ABNORMAL LOW (ref 36.0–46.0)
Hemoglobin: 9.7 g/dL — ABNORMAL LOW (ref 12.0–15.0)
MCH: 31.2 pg (ref 26.0–34.0)
MCHC: 32 g/dL (ref 30.0–36.0)
MCV: 97.4 fL (ref 80.0–100.0)
Platelets: 161 10*3/uL (ref 150–400)
RBC: 3.11 MIL/uL — ABNORMAL LOW (ref 3.87–5.11)
RDW: 12.9 % (ref 11.5–15.5)
WBC: 8.7 10*3/uL (ref 4.0–10.5)
nRBC: 0 % (ref 0.0–0.2)

## 2022-05-09 MED ORDER — OXYCODONE HCL 10 MG PO TABS: 10.0000 mg | ORAL_TABLET | Freq: Four times a day (QID) | ORAL | 0 refills | Status: DC | PRN

## 2022-05-09 MED ORDER — ASPIRIN 325 MG PO TBEC: 325.0000 mg | DELAYED_RELEASE_TABLET | Freq: Two times a day (BID) | ORAL | 0 refills | Status: AC

## 2022-05-09 NOTE — Progress Notes (Signed)
Physical Therapy Treatment Patient Details Name: Valerie Baker MRN: [id number] DOB: 19-Jul-1956 Today's Date: 05/09/2022   History of Present Illness 66 yo female s/p R TKA on 05/07/22. PMH: HTN, TKA, lumbar fusion, chronic pain, fibromyalgia, chronic pain    PT Comments    Pt progressing well, she would like to do 2 sessions of PT prior to d/c later today    Recommendations for follow up therapy are one component of a multi-disciplinary discharge planning process, led by the attending physician.  Recommendations may be updated based on patient status, additional functional criteria and insurance authorization.  Follow Up Recommendations  Follow physician's recommendations for discharge plan and follow up therapies     Assistance Recommended at Discharge Intermittent Supervision/Assistance  Patient can return home with the following A little help with walking and/or transfers;A little help with bathing/dressing/bathroom;Assistance with cooking/housework;Assist for transportation;Help with stairs or ramp for entrance   Equipment Recommendations  Other (comment) (delivered - RW)    Recommendations for Other Services       Precautions / Restrictions Precautions Precautions: Knee;Fall Required Braces or Orthoses: Knee Immobilizer - Right Knee Immobilizer - Right: Discontinue once straight leg raise with < 10 degree lag Restrictions Weight Bearing Restrictions: No RLE Weight Bearing: Weight bearing as tolerated     Mobility  Bed Mobility Overal bed mobility: Needs Assistance Bed Mobility: Supine to Sit, Sit to Supine     Supine to sit: Supervision     General bed mobility comments: for  safety, no physical assist, pt able to use gait belt as leg lifter    Transfers Overall transfer level: Needs assistance Equipment used: Rolling walker (2 wheels) Transfers: Sit to/from Stand Sit to Stand: Min guard           General transfer comment: cues for hand placement and  RLE position    Ambulation/Gait Ambulation/Gait assistance: Supervision Gait Distance (Feet): 50 Feet (10') Assistive device: Rolling walker (2 wheels) Gait Pattern/deviations: Step-to pattern, Antalgic       General Gait Details: verbal cues for sequence and RW position. distance ltd by pain and fatigue   Stairs             Wheelchair Mobility    Modified Rankin (Stroke Patients Only)       Balance                                            Cognition Arousal/Alertness: Awake/alert Behavior During Therapy: WFL for tasks assessed/performed Overall Cognitive Status: Within Functional Limits for tasks assessed                                          Exercises Total Joint Exercises Ankle Circles/Pumps: AROM, Both, 10 reps Quad Sets: AROM, Both, 10 reps Heel Slides: AAROM, Right, 10 reps Hip ABduction/ADduction: AROM, AAROM, Right, 10 reps Straight Leg Raises: AAROM, Right, AROM, 10 reps Goniometric ROM: grossly 10 to 70 degrees right knee flexion    General Comments        Pertinent Vitals/Pain Pain Assessment Pain Assessment: 0-10 Pain Score: 5  Pain Location: right knee Pain Descriptors / Indicators: Aching, Grimacing, Sore    Home Living  Prior Function            PT Goals (current goals can now be found in the care plan section) Acute Rehab PT Goals Patient Stated Goal: home PT Goal Formulation: With patient Time For Goal Achievement: 05/14/22 Potential to Achieve Goals: Good    Frequency    7X/week      PT Plan Current plan remains appropriate    Co-evaluation              AM-PAC PT "6 Clicks" Mobility   Outcome Measure  Help needed turning from your back to your side while in a flat bed without using bedrails?: A Little Help needed moving from lying on your back to sitting on the side of a flat bed without using bedrails?: None Help needed moving to  and from a bed to a chair (including a wheelchair)?: A Little Help needed standing up from a chair using your arms (e.g., wheelchair or bedside chair)?: A Little Help needed to walk in hospital room?: A Little Help needed climbing 3-5 steps with a railing? : A Little 6 Click Score: 19    End of Session Equipment Utilized During Treatment: Gait belt Activity Tolerance: Patient tolerated treatment well Patient left: in chair;with call bell/phone within reach;with chair alarm set;with family/visitor present Nurse Communication: Mobility status PT Visit Diagnosis: Other abnormalities of gait and mobility (R26.89);Difficulty in walking, not elsewhere classified (R26.2)     Time: 2542-7062 PT Time Calculation (min) (ACUTE ONLY): 27 min  Charges:  $Gait Training: 8-22 mins $Therapeutic Exercise: 8-22 mins                     Baxter Flattery, PT  Acute Rehab Dept [facility]) [phone]  [facility] Weekend Pager [facility] only)  [phone]  05/09/2022    [facility] 05/09/2022, 11:29 AM

## 2022-05-09 NOTE — Progress Notes (Signed)
   Subjective: 2 Days Post-Op Procedure(s) (LRB): TOTAL KNEE ARTHROPLASTY (Right) Patient seen in rounds by Dr. Wynelle Link. Patient is well, and has had no acute complaints or problems. Denies SOB or chest pain. Denies calf pain. Voiding without difficulty. Patient reports pain as moderate.  Worked with physical therapy yesterday and ambulated 30'.  Objective: Vital signs in last 24 hours: Temp:  [98.2 F (36.8 C)-98.7 F (37.1 C)] 98.7 F (37.1 C) (09/27 0551) Pulse Rate:  [71-77] 77 (09/27 0551) Resp:  [16-17] 17 (09/27 0551) BP: (139-155)/(61-90) 140/67 (09/27 0551) SpO2:  [94 %-97 %] 97 % (09/27 0551)  Intake/Output from previous day:  Intake/Output Summary (Last 24 hours) at 05/09/2022 0737 Last data filed at 05/09/2022 0651 Gross per 24 hour  Intake 750 ml  Output 400 ml  Net 350 ml    Intake/Output this shift: No intake/output data recorded.  Labs: Recent Labs    05/08/22 0753 05/09/22 0339  HGB 9.7* 9.7*   Recent Labs    05/08/22 0753 05/09/22 0339  WBC 8.7 8.7  RBC 3.10* 3.11*  HCT 30.7* 30.3*  PLT 156 161   Recent Labs    05/08/22 0347  NA 141  K 4.1  CL 106  CO2 30  BUN 14  CREATININE 0.73  GLUCOSE 113*  CALCIUM 8.3*   No results for input(s): "LABPT", "INR" in the last 72 hours.  Exam: General - Patient is Alert and Oriented Extremity - Neurologically intact Neurovascular intact Sensation intact distally Dorsiflexion/Plantar flexion intact Dressing/Incision - clean, dry, no drainage Motor Function - intact, moving foot and toes well on exam.  Past Medical History:  Diagnosis Date   Adenomatous colon polyp 06/13/2006   Allergy    SINUS   Anxiety    Anxiety and depression    Arthritis    Asthma    Bladder incontinence    Blood transfusion    Cataract    Depression    Fibromyalgia    GERD (gastroesophageal reflux disease)    History of kidney stones    Hyperlipidemia    Kidney stones    Obesity    Osteopenia     Post-operative nausea and vomiting     Assessment/Plan: 2 Days Post-Op Procedure(s) (LRB): TOTAL KNEE ARTHROPLASTY (Right) Principal Problem:   OA (osteoarthritis) of knee Active Problems:   Osteoarthritis of right knee  Estimated body mass index is 39.49 kg/m as calculated from the following:   Height as of this encounter: '5\' 1"'$  (1.549 m).   Weight as of this encounter: 94.8 kg.  DVT Prophylaxis - Aspirin Weight-bearing as tolerated.  Continue with physical therapy. Expected discharge today pending progress with physical therapy and symptom management. Scheduled for OPPT at [facility].  Follow-up in clinic in 2 weeks.  The PDMP database was reviewed today prior to any opioid medications being prescribed to this patient.  R. Jaynie Bream, PA-C Orthopedic Surgery [phone] 05/09/2022, 7:37 AM

## 2022-05-09 NOTE — TOC Transition Note (Signed)
Transition of Care [facility]) - CM/SW Discharge Note   Patient Details  Name: Valerie Baker MRN: [id number] Date of Birth: Nov 16, 1955  Transition of Care [facility]) CM/SW Contact:  Lennart Pall, LCSW Phone Number: 05/09/2022, 1:41 PM   Clinical Narrative:    Met with pt and confirming need for RW/ no DME agency preference/ referral placed with [employer] and delivered to room.  OPPT already set up with [facility]).  No further TOC needs.   Final next level of care: OP Rehab Barriers to Discharge: No Barriers Identified   Patient Goals and CMS Choice Patient states their goals for this hospitalization and ongoing recovery are:: return home      Discharge Placement                       Discharge Plan and Services                DME Arranged: Walker rolling DME Agency: [employer] Date DME Agency Contacted: 05/09/22 Time DME Agency Contacted: [phone] Representative spoke with at DME Agency: Wells Guiles            Social Determinants of Health ([place]) Interventions     Readmission Risk Interventions     No data to display

## 2022-05-09 NOTE — Progress Notes (Signed)
05/09/22 1300  PT Visit Information  Assistance Needed Pt continues to meet PT goals. Ready for d/c with husband assist as needed. Reviewed HEP hand out with pt and husband.  History of Present Illness 66 yo female s/p R TKA on 05/07/22. PMH: HTN, TKA, lumbar fusion, chronic pain, fibromyalgia, chronic pain  Subjective Data  Patient Stated Goal home  Precautions  Precautions Knee;Fall  Required Braces or Orthoses Knee Immobilizer - Right  Knee Immobilizer - Right Discontinue once straight leg raise with < 10 degree lag  Restrictions  Weight Bearing Restrictions No  RLE Weight Bearing WBAT  Pain Assessment  Pain Assessment 0-10  Pain Score 4  Pain Location right knee  Pain Descriptors / Indicators Aching;Grimacing;Sore  Pain Intervention(s) Limited activity within patient's tolerance;Monitored during session;Repositioned  Cognition  Arousal/Alertness Awake/alert  Behavior During Therapy WFL for tasks assessed/performed  Overall Cognitive Status Within Functional Limits for tasks assessed  Bed Mobility  Overal bed mobility Needs Assistance  Bed Mobility Supine to Sit  Supine to sit Supervision  General bed mobility comments for  safety, no physical assist, pt able to use gait belt as leg lifter  Transfers  Overall transfer level Needs assistance  Equipment used Rolling walker (2 wheels)  Transfers Sit to/from Stand  Sit to Stand Min guard;Supervision  General transfer comment cues for hand placement and RLE position  Ambulation/Gait  Ambulation/Gait assistance Supervision  Gait Distance (Feet) 60 Feet  Assistive device Rolling walker (2 wheels)  Gait Pattern/deviations Step-to pattern;Antalgic  General Gait Details verbal cues for sequence and RW position.  PT - End of Session  Equipment Utilized During Treatment Gait belt  Activity Tolerance Patient tolerated treatment well  Patient left in chair;with call bell/phone within reach;with chair alarm set;with family/visitor  present  Nurse Communication Mobility status   PT - Assessment/Plan  PT Plan Current plan remains appropriate  PT Visit Diagnosis Other abnormalities of gait and mobility (R26.89);Difficulty in walking, not elsewhere classified (R26.2)  PT Frequency (ACUTE ONLY) 7X/week  Follow Up Recommendations Follow physician's recommendations for discharge plan and follow up therapies  Assistance recommended at discharge Intermittent Supervision/Assistance  Patient can return home with the following A little help with walking and/or transfers;A little help with bathing/dressing/bathroom;Assistance with cooking/housework;Assist for transportation;Help with stairs or ramp for entrance  PT equipment Other (comment) (delivered - RW)  AM-PAC PT "6 Clicks" Mobility Outcome Measure (Version 2)  Help needed turning from your back to your side while in a flat bed without using bedrails? 3  Help needed moving from lying on your back to sitting on the side of a flat bed without using bedrails? 4  Help needed moving to and from a bed to a chair (including a wheelchair)? 3  Help needed standing up from a chair using your arms (e.g., wheelchair or bedside chair)? 3  Help needed to walk in hospital room? 3  Help needed climbing 3-5 steps with a railing?  3  6 Click Score 19  Consider Recommendation of Discharge To: Home with [facility]  Progressive Mobility  What is the highest level of mobility based on the progressive mobility assessment? Level 5 (Walks with assist in room/hall) - Balance while stepping forward/back and can walk in room with assist - Complete  Activity Ambulated with assistance in hallway  PT Goal Progression  Progress towards PT goals Progressing toward goals  Acute Rehab PT Goals  PT Goal Formulation With patient  Time For Goal Achievement 05/14/22  Potential to Achieve  Goals Good  PT Time Calculation  PT Start Time (ACUTE ONLY) 1244  PT Stop Time (ACUTE ONLY) 1308  PT Time Calculation (min) (ACUTE  ONLY) 24 min  PT General Charges  $$ ACUTE PT VISIT 1 Visit  PT Treatments  $Gait Training 23-37 mins

## 2022-05-09 NOTE — Progress Notes (Signed)
Discharge package printed and dc instructions given to patient. Patient verbalizes understanding.

## 2022-05-10 ENCOUNTER — Encounter: Payer: Self-pay | Admitting: Physical Therapy

## 2022-05-10 ENCOUNTER — Other Ambulatory Visit: Payer: Self-pay

## 2022-05-10 ENCOUNTER — Ambulatory Visit: Payer: Medicare Other | Attending: Orthopedic Surgery | Admitting: Physical Therapy

## 2022-05-10 DIAGNOSIS — M25661 Stiffness of right knee, not elsewhere classified: Secondary | ICD-10-CM | POA: Diagnosis not present

## 2022-05-10 DIAGNOSIS — R600 Localized edema: Secondary | ICD-10-CM | POA: Diagnosis not present

## 2022-05-10 DIAGNOSIS — G8929 Other chronic pain: Secondary | ICD-10-CM | POA: Diagnosis not present

## 2022-05-10 DIAGNOSIS — M6281 Muscle weakness (generalized): Secondary | ICD-10-CM | POA: Insufficient documentation

## 2022-05-10 DIAGNOSIS — M25561 Pain in right knee: Secondary | ICD-10-CM | POA: Diagnosis not present

## 2022-05-10 NOTE — Therapy (Signed)
OUTPATIENT PHYSICAL THERAPY LOWER EXTREMITY EVALUATION   Patient Name: Valerie Baker MRN: [id number] DOB:Jan 04, 1956, 66 y.o., female Today's Date: 05/10/2022   PT End of Session - 05/10/22 1009     Visit Number 1    Number of Visits 12    Date for PT Re-Evaluation 06/07/22    Authorization Type FOTO AT LEAST EVERY 5TH VISIT.  PROGRESS NOTE AT 10TH VISIT.  KX MODIFIER AFTER 15 VISITS.    PT Start Time 0907    PT Stop Time 0950    PT Time Calculation (min) 43 min    Activity Tolerance Patient tolerated treatment well    Behavior During Therapy [facility] for tasks assessed/performed             Past Medical History:  Diagnosis Date   Adenomatous colon polyp 06/13/2006   Allergy    SINUS   Anxiety    Anxiety and depression    Arthritis    Asthma    Bladder incontinence    Blood transfusion    Cataract    Depression    Fibromyalgia    GERD (gastroesophageal reflux disease)    History of kidney stones    Hyperlipidemia    Kidney stones    Obesity    Osteopenia    Post-operative nausea and vomiting    Past Surgical History:  Procedure Laterality Date   ABDOMINAL HYSTERECTOMY  1992   partial in [place]  2002   2002-2022 multi  x 6  fusions and rods   BREAST BIOPSY Bilateral 11/26/2017   FIBROCYSTIC CHANGES WITH CALCIFICATIONS    CERVICAL SPINE SURGERY  2014   with rod   COLONOSCOPY  06/12/2011   [place] Plan, 03/17/21 MS   ELBOW SURGERY  2003   EYE SURGERY Bilateral    cataract   FOOT SURGERY  1994   right   KIDNEY STONE SURGERY  2004   KNEE ARTHROSCOPY Left 2008   LUMBAR FUSION     2002,2003,2007,2008,2010, 2022 with fusion and rods   NASAL SINUS SURGERY  1245   NISSEN FUNDOPLICATION  8099   ROTATOR CUFF REPAIR Bilateral [id number]   SPINAL CORD STIMULATOR IMPLANT  2005   SPINAL CORD STIMULATOR IMPLANT  2004   SPINAL CORD STIMULATOR REMOVAL  2008   TOTAL KNEE ARTHROPLASTY Left 2008,2009   TOTAL KNEE ARTHROPLASTY Right  05/07/2022   Procedure: TOTAL KNEE ARTHROPLASTY;  Surgeon: Gaynelle Arabian, MD;  Location: [facility];  Service: Orthopedics;  Laterality: Right;   Patient Active Problem List   Diagnosis Date Noted   Osteoarthritis of right knee 05/07/2022   Neck pain 03/15/2022   Back pain 03/15/2022   Symptomatic mammary hypertrophy 03/15/2022   Chronic pain syndrome 12/25/2021   Pure hypercholesterolemia 01/22/2020   Dysfunction of both eustachian tubes 04/06/2019   OA (osteoarthritis) of knee 12/30/2017   Depression, recurrent ([place]) 10/27/2017   Contact dermatitis and eczema due to plant 11/29/2016   Overactive bladder 11/29/2016   Frequent urination 01/23/2016   Urgency of urination 01/23/2016   Rash 01/23/2016   Urinary frequency 04/21/2015   Stress fracture of pelvis 11/03/2014   Disc degeneration, lumbosacral 11/03/2014   Osteopenia 11/03/2014   Severe obesity (BMI >= 40) ([place]) 11/03/2014   Essential hypertension 04/04/2009   Asthma 04/04/2009   MIGRAINES, HX OF 04/04/2009      REFERRING PROVIDER: Gaynelle Arabian MD  REFERRING DIAG: Right total knee arthroplasty.  THERAPY DIAG:  Chronic pain of  right knee  Stiffness of right knee, not elsewhere classified  Localized edema  Muscle weakness (generalized)  Rationale for Evaluation and Treatment Rehabilitation  ONSET DATE: 05/07/22 (surgery date).  SUBJECTIVE:   SUBJECTIVE STATEMENT: The patient presents to the clinic today s/p right total knee replacement performed on 05/07/22.  She presented to the clinic today with a pain-level of 8/10.  She was prescribed exercises from the hospital  and I encouraged her to be compliant to them.  At this point she states nothing is decreasing her pain and "everything" increases it. Aquacel intact.   PERTINENT HISTORY: Back surgery, elbow surgery, right foot surgery, bilateral RTC repair, spinal cord stimulator, left TKA.  PAIN:  Are you having pain? Yes: NPRS scale: 8/10 Pain location: Right  knee. Pain description: Ache, sore, throbbing, sharp, numb Aggravating factors: As above. Relieving factors: As above.  PRECAUTIONS: Other: No ultrasound.  WEIGHT BEARING RESTRICTIONS No  FALLS:  Has patient fallen in last 6 months? No  LIVING ENVIRONMENT: Lives with: lives with their spouse Lives in: House/apartment Stairs: No Has following equipment at home: Gilford Rile - 2 wheeled  OCCUPATION: Disabled.  PLOF: Independent with basic ADLs  PATIENT GOALS:  Not have knee pain.   OBJECTIVE:  PATIENT SURVEYS:  FOTO Complete. EDEMA:  Circumferential: RT 8 cms > LT. TRANSFERS:  Assist required to right LE (sit to supine to sit).  PALPATION: C/o diffuse right knee pain currently.  LOWER EXTREMITY ROM:  In supine:  -20 degrees of right knee extension and passive to -15 degrees with flexion to 87 degrees. LOWER EXTREMITY MMT:  Patient is currently unable to perform a right antigravity SLR.  Right quad strength graded at 3-/5 at this time.    GAIT: Antalgic gait pattern with patient using a FWW with a step-to gait pattern.  TODAY'S TREATMENT: Vasopneumatic on low to patient's right knee x 15 minutes with elevation.  Patient enjoyed treatment.   ASSESSMENT:  CLINICAL IMPRESSION: The patient presents to OPPT s/p right total knee replacement perform on 05/07/22.  She was in a lot of pain today.  She is walking with a FWW with a step-to gait pattern.  She is currently unable to perform an antigravity right SLR.  She is lacking extension.  I told her to work hard on this over the weekend.  She has a significant amount of edema at this time.  Transitory movements require assist to her right LE.  Patient will benefit from skilled physical therapy intervention to address pain and deficits.    OBJECTIVE IMPAIRMENTS Abnormal gait, decreased activity tolerance, decreased mobility, difficulty walking, decreased ROM, decreased strength, increased edema, and pain.   ACTIVITY  LIMITATIONS carrying, lifting, bending, bed mobility, and locomotion level  PARTICIPATION LIMITATIONS: meal prep, cleaning, laundry, and driving  REHAB POTENTIAL: Excellent  CLINICAL DECISION MAKING: Stable/uncomplicated  EVALUATION COMPLEXITY: Low   GOALS:  SHORT TERM GOALS: Target date: 05/24/2022  Ind with initial HEP. Baseline: Goal status: INITIAL  2.  Achieve full active right knee extension. Baseline:  Goal status: INITIAL  LONG TERM GOALS: Target date: 06/07/2022   Active right knee flexion to 115 degrees+ so the patient can perform functional tasks and do so with pain not > 2-3/10. Baseline:  Goal status: INITIAL  2.  Increase right hip and knee strength to a solid 4+/5 to provide good stability for accomplishment of functional activities. Baseline:  Goal status: INITIAL  3.  Perform a reciprocating stair gait with one railing with pain not >  2-3/10. Baseline:  Goal status: INITIAL  4.  Decrease edema to within 2.5 cms of non-affected side to assist with pain reduction and range of motion gains. Baseline:  Goal status: INITIAL    PLAN: PT FREQUENCY:  2-3 times a week.  PT DURATION: 4 weeks  PLANNED INTERVENTIONS: Therapeutic exercises, Therapeutic activity, Neuromuscular re-education, Gait training, Patient/Family education, Self Care, Electrical stimulation, Cryotherapy, Moist heat, Vasopneumatic device, and Manual therapy  PLAN FOR NEXT SESSION: Nustep.  Progress into TKA protocol.   Davell Beckstead, [place], PT 05/10/2022, 11:02 AM

## 2022-05-10 NOTE — Discharge Summary (Signed)
Physician Discharge Summary   Patient ID: Valerie Baker MRN: [id number] DOB/AGE: 01/04/1956 66 y.o.  Admit date: 05/07/2022 Discharge date: 05/09/2022  Primary Diagnosis: Osteoarthritis, right knee   Admission Diagnoses:  Past Medical History:  Diagnosis Date   Adenomatous colon polyp 06/13/2006   Allergy    SINUS   Anxiety    Anxiety and depression    Arthritis    Asthma    Bladder incontinence    Blood transfusion    Cataract    Depression    Fibromyalgia    GERD (gastroesophageal reflux disease)    History of kidney stones    Hyperlipidemia    Kidney stones    Obesity    Osteopenia    Post-operative nausea and vomiting    Discharge Diagnoses:   Principal Problem:   OA (osteoarthritis) of knee Active Problems:   Osteoarthritis of right knee  Estimated body mass index is 39.49 kg/m as calculated from the following:   Height as of this encounter: '5\' 1"'$  (1.549 m).   Weight as of this encounter: 94.8 kg.  Procedure:  Procedure(s) (LRB): TOTAL KNEE ARTHROPLASTY (Right)   Consults: None  HPI: Valerie Baker is a 66 y.o. year old female with end stage OA of his right knee with progressively worsening pain and dysfunction. He has constant pain, with activity and at rest and significant functional deficits with difficulties even with ADLs. He has had extensive non-op management including analgesics, injections of cortisone and viscosupplements, and home exercise program, but remains in significant pain with significant dysfunction. Radiographs show bone on bone arthritis medial and patellofemoral. He presents now for right Total Knee Arthroplasty.  Laboratory Data: Admission on 05/07/2022, Discharged on 05/09/2022  Component Date Value Ref Range Status   Sodium 05/08/2022 141  135 - 145 mmol/L Final   Potassium 05/08/2022 4.1  3.5 - 5.1 mmol/L Final   Chloride 05/08/2022 106  98 - 111 mmol/L Final   CO2 05/08/2022 30  22 - 32 mmol/L Final   Glucose, Bld  05/08/2022 113 (H)  70 - 99 mg/dL Final   Glucose reference range applies only to samples taken after fasting for at least 8 hours.   BUN 05/08/2022 14  8 - 23 mg/dL Final   Creatinine, Ser 05/08/2022 0.73  0.44 - 1.00 mg/dL Final   Calcium 05/08/2022 8.3 (L)  8.9 - 10.3 mg/dL Final   GFR, Estimated 05/08/2022 >60  >60 mL/min Final   Comment: (NOTE) Calculated using the CKD-EPI Creatinine Equation (2021)    Anion gap 05/08/2022 5  5 - 15 Final   Performed at [facility], [place] [address], [place], [place] [address]   WBC 05/08/2022 8.7  4.0 - 10.5 K/uL Final   RBC 05/08/2022 3.10 (L)  3.87 - 5.11 MIL/uL Final   Hemoglobin 05/08/2022 9.7 (L)  12.0 - 15.0 g/dL Final   HCT 05/08/2022 30.7 (L)  36.0 - 46.0 % Final   MCV 05/08/2022 99.0  80.0 - 100.0 fL Final   MCH 05/08/2022 31.3  26.0 - 34.0 pg Final   MCHC 05/08/2022 31.6  30.0 - 36.0 g/dL Final   RDW 05/08/2022 12.9  11.5 - 15.5 % Final   Platelets 05/08/2022 156  150 - 400 K/uL Final   nRBC 05/08/2022 0.0  0.0 - 0.2 % Final   Performed at [facility], [place] [address], [place], [place] [address]   WBC 05/09/2022 8.7  4.0 - 10.5 K/uL Final  RBC 05/09/2022 3.11 (L)  3.87 - 5.11 MIL/uL Final   Hemoglobin 05/09/2022 9.7 (L)  12.0 - 15.0 g/dL Final   HCT 05/09/2022 30.3 (L)  36.0 - 46.0 % Final   MCV 05/09/2022 97.4  80.0 - 100.0 fL Final   MCH 05/09/2022 31.2  26.0 - 34.0 pg Final   MCHC 05/09/2022 32.0  30.0 - 36.0 g/dL Final   RDW 05/09/2022 12.9  11.5 - 15.5 % Final   Platelets 05/09/2022 161  150 - 400 K/uL Final   nRBC 05/09/2022 0.0  0.0 - 0.2 % Final   Performed at [facility], [address] [place], [place] [address]  Hospital Outpatient Visit on 04/25/2022  Component Date Value Ref Range Status   Sodium 04/25/2022 140  135 - 145 mmol/L Final   Potassium 04/25/2022 4.5  3.5 - 5.1 mmol/L Final   Chloride 04/25/2022 108  98 - 111 mmol/L Final   CO2 04/25/2022 27  22 -  32 mmol/L Final   Glucose, Bld 04/25/2022 96  70 - 99 mg/dL Final   Glucose reference range applies only to samples taken after fasting for at least 8 hours.   BUN 04/25/2022 15  8 - 23 mg/dL Final   Creatinine, Ser 04/25/2022 0.69  0.44 - 1.00 mg/dL Final   Calcium 04/25/2022 9.2  8.9 - 10.3 mg/dL Final   GFR, Estimated 04/25/2022 >60  >60 mL/min Final   Comment: (NOTE) Calculated using the CKD-EPI Creatinine Equation (2021)    Anion gap 04/25/2022 5  5 - 15 Final   Performed at [facility], [place] [address] [place], [place] [address]   WBC 04/25/2022 5.6  4.0 - 10.5 K/uL Final   RBC 04/25/2022 3.86 (L)  3.87 - 5.11 MIL/uL Final   Hemoglobin 04/25/2022 12.2  12.0 - 15.0 g/dL Final   HCT 04/25/2022 37.3  36.0 - 46.0 % Final   MCV 04/25/2022 96.6  80.0 - 100.0 fL Final   MCH 04/25/2022 31.6  26.0 - 34.0 pg Final   MCHC 04/25/2022 32.7  30.0 - 36.0 g/dL Final   RDW 04/25/2022 12.5  11.5 - 15.5 % Final   Platelets 04/25/2022 204  150 - 400 K/uL Final   nRBC 04/25/2022 0.0  0.0 - 0.2 % Final   Performed at [facility], [place] [address], [place], [place] [address]   MRSA, PCR 04/25/2022 NEGATIVE  NEGATIVE Final   Staphylococcus aureus 04/25/2022 NEGATIVE  NEGATIVE Final   Comment: (NOTE) The Xpert SA Assay (FDA approved for NASAL specimens in patients 62 years of age and older), is one component of a comprehensive surveillance program. It is not intended to diagnose infection nor to guide or monitor treatment. Performed at [facility], [place] [address], [place], [place] [address]      X-Rays:No results found.  EKG: Orders placed or performed in visit on 03/14/22   EKG 12-Lead     Hospital Course: Valerie Baker is a 66 y.o. who was admitted to [facility]. They were brought to the operating room on 05/07/2022 and underwent Procedure(s): TOTAL KNEE ARTHROPLASTY.  Patient tolerated the procedure well and was later  transferred to the recovery room and then to the orthopaedic floor for postoperative care. They were given PO and IV analgesics for pain control following their surgery. They were given 24 hours of postoperative antibiotics of  Anti-infectives (From admission, onward)    Start     Dose/Rate  Route Frequency Ordered Stop   05/07/22 1400  ceFAZolin (ANCEF) IVPB 2g/100 mL premix        2 g 200 mL/hr over 30 Minutes Intravenous Every 6 hours 05/07/22 1100 05/07/22 2044   05/07/22 0600  ceFAZolin (ANCEF) IVPB 2g/100 mL premix        2 g 200 mL/hr over 30 Minutes Intravenous On call to O.R. 05/07/22 [id number] 05/07/22 0748     and started on DVT prophylaxis in the form of Aspirin.   PT and OT were ordered for total joint protocol. Discharge planning consulted to help with postop disposition and equipment needs. Patient had a fair night on the evening of surgery. They started to get up OOB with therapy on POD #0. Continued to work with therapy into POD #2. Patient was seen during rounds on day two and was ready to go home pending progress with physical therapy. Patient worked with therapy for 5 total sessions and was meeting their goals. Dressing was changed and the incision was C/D/I.  They were discharged home later that day in stable condition.  Diet: Regular diet Activity: WBAT Follow-up: in 2 weeks Disposition: Home Discharged Condition: stable   Discharge Instructions     Call MD / Call 911   Complete by: As directed    If you experience chest pain or shortness of breath, CALL 911 and be transported to the hospital emergency room.  If you develope a fever above 101 F, pus (white drainage) or increased drainage or redness at the wound, or calf pain, call your surgeon's office.   Change dressing   Complete by: As directed    You may remove the bulky bandage (ACE wrap and gauze) two days after surgery. You will have an adhesive waterproof bandage underneath. Leave this in place until your first  follow-up appointment.   Constipation Prevention   Complete by: As directed    Drink plenty of fluids.  Prune juice may be helpful.  You may use a stool softener, such as Colace (over the counter) 100 mg twice a day.  Use MiraLax (over the counter) for constipation as needed.   Diet - low sodium heart healthy   Complete by: As directed    Do not put a pillow under the knee. Place it under the heel.   Complete by: As directed    Driving restrictions   Complete by: As directed    No driving for two weeks   Post-operative opioid taper instructions:   Complete by: As directed    POST-OPERATIVE OPIOID TAPER INSTRUCTIONS: It is important to wean off of your opioid medication as soon as possible. If you do not need pain medication after your surgery it is ok to stop day one. Opioids include: Codeine, Hydrocodone(Norco, Vicodin), Oxycodone(Percocet, oxycontin) and hydromorphone amongst others.  Long term and even short term use of opiods can cause: Increased pain response Dependence Constipation Depression Respiratory depression And more.  Withdrawal symptoms can include Flu like symptoms Nausea, vomiting And more Techniques to manage these symptoms Hydrate well Eat regular healthy meals Stay active Use relaxation techniques(deep breathing, meditating, yoga) Do Not substitute Alcohol to help with tapering If you have been on opioids for less than two weeks and do not have pain than it is ok to stop all together.  Plan to wean off of opioids This plan should start within one week post op of your joint replacement. Maintain the same interval or time between taking each dose and first decrease  the dose.  Cut the total daily intake of opioids by one tablet each day Next start to increase the time between doses. The last dose that should be eliminated is the evening dose.      TED hose   Complete by: As directed    Use stockings (TED hose) for three weeks on both leg(s).  You may  remove them at night for sleeping.   Weight bearing as tolerated   Complete by: As directed       Allergies as of 05/09/2022       Reactions   Latex Itching, Rash   Sulfonamide Derivatives Rash        Medication List     STOP taking these medications    diclofenac Sodium 1 % Gel Commonly known as: Voltaren   meloxicam 15 MG tablet Commonly known as: MOBIC       TAKE these medications    albuterol 108 (90 Base) MCG/ACT inhaler Commonly known as: VENTOLIN HFA Inhale 2 puffs into the lungs every 6 (six) hours as needed for wheezing or shortness of breath.   ascorbic acid 500 MG tablet Commonly known as: VITAMIN C Take 500 mg by mouth daily.   aspirin EC 325 MG tablet Take 1 tablet (325 mg total) by mouth 2 (two) times daily for 19 days. Then take one 81 mg aspirin once a day for three weeks. Then discontinue aspirin.   baclofen 10 MG tablet Commonly known as: LIORESAL TAKE 1/2 TO 1 TABLET BY  MOUTH 3 TIMES DAILY AS  NEEDED FOR MUSCLE SPASMS What changed: See the new instructions.   buPROPion ER 100 MG 12 hr tablet Commonly known as: Wellbutrin SR Take 1 tablet (100 mg total) by mouth in the morning.   Calcium Carbonate-Vitamin D 600-400 MG-UNIT tablet Take 1 tablet by mouth 2 (two) times daily.   cycloSPORINE 0.05 % ophthalmic emulsion Commonly known as: RESTASIS Place 1 drop into both eyes 2 (two) times daily.   DULoxetine 60 MG capsule Commonly known as: Cymbalta Take 1 capsule (60 mg total) by mouth daily.   fluticasone 50 MCG/ACT nasal spray Commonly known as: FLONASE Place 2 sprays into both nostrils daily. What changed: when to take this   gabapentin 400 MG capsule Commonly known as: NEURONTIN TAKE 2 CAPSULES BY MOUTH 3  TIMES DAILY What changed:  how much to take additional instructions   iVIZIA Dry Eyes 0.5 % Soln Generic drug: Povidone (PF) Place 1 drop into both eyes every evening.   loratadine 10 MG tablet Commonly known as:  CLARITIN Take 10 mg by mouth daily.   multivitamin tablet Take 1 tablet by mouth daily.   omeprazole 40 MG capsule Commonly known as: PRILOSEC Take 1 capsule (40 mg total) by mouth 2 (two) times daily.   oxybutynin 5 MG tablet Commonly known as: DITROPAN Take 1 tablet (5 mg total) by mouth 2 (two) times daily.   Oxycodone HCl 10 MG Tabs Take 1 tablet (10 mg total) by mouth every 6 (six) hours as needed for severe pain (not responding to chronic percocet).   oxyCODONE-acetaminophen 7.5-325 MG tablet Commonly known as: PERCOCET Take 1 tablet by mouth 2 (two) times daily as needed for severe pain. What changed: Another medication with the same name was removed. Continue taking this medication, and follow the directions you see here.   oxyCODONE-acetaminophen 7.5-325 MG tablet Commonly known as: PERCOCET Take 1 tablet by mouth 2 (two) times daily as needed for severe pain.  What changed: Another medication with the same name was removed. Continue taking this medication, and follow the directions you see here.   traZODone 100 MG tablet Commonly known as: DESYREL Take 1 tablet (100 mg total) by mouth at bedtime. As needed for sleep/ insomnia What changed:  how much to take additional instructions   Vitamin D3 50 MCG (2000 UT) Tabs Take 2,000 Units by mouth 2 (two) times daily.   zinc gluconate 50 MG tablet Take 50 mg by mouth daily.               Discharge Care Instructions  (From admission, onward)           Start     Ordered   05/09/22 0000  Weight bearing as tolerated        05/09/22 0746   05/09/22 0000  Change dressing       Comments: You may remove the bulky bandage (ACE wrap and gauze) two days after surgery. You will have an adhesive waterproof bandage underneath. Leave this in place until your first follow-up appointment.   05/09/22 0746            Follow-up Information     Gaynelle Arabian, MD. Go on 05/23/2022.   Specialty: Orthopedic  Surgery Why: You are scheduled for first post op appointment on Wednesday October 11th at 2:30pm. Contact information: [address] [place] [address] [phone]                 Signed: R. Jaynie Bream, PA-C Orthopedic Surgery 05/10/2022, 7:59 AM

## 2022-05-15 ENCOUNTER — Encounter: Payer: Self-pay | Admitting: Physical Therapy

## 2022-05-15 ENCOUNTER — Ambulatory Visit: Payer: Medicare Other | Attending: Orthopedic Surgery | Admitting: Physical Therapy

## 2022-05-15 DIAGNOSIS — M6281 Muscle weakness (generalized): Secondary | ICD-10-CM | POA: Insufficient documentation

## 2022-05-15 DIAGNOSIS — R600 Localized edema: Secondary | ICD-10-CM | POA: Insufficient documentation

## 2022-05-15 DIAGNOSIS — M25661 Stiffness of right knee, not elsewhere classified: Secondary | ICD-10-CM | POA: Insufficient documentation

## 2022-05-15 DIAGNOSIS — M25561 Pain in right knee: Secondary | ICD-10-CM | POA: Diagnosis not present

## 2022-05-15 DIAGNOSIS — G8929 Other chronic pain: Secondary | ICD-10-CM | POA: Insufficient documentation

## 2022-05-15 NOTE — Therapy (Signed)
OUTPATIENT PHYSICAL THERAPY LOWER EXTREMITY EVALUATION   Patient Name: Valerie Baker MRN: [id number] DOB:1956/02/24, 66 y.o., female Today's Date: 05/15/2022   PT End of Session - 05/15/22 1021     Visit Number 2    Number of Visits 12    Date for PT Re-Evaluation 06/07/22    Authorization Type FOTO AT LEAST EVERY 5TH VISIT.  PROGRESS NOTE AT 10TH VISIT.  KX MODIFIER AFTER 15 VISITS.    PT Start Time 0945    PT Stop Time 1030    PT Time Calculation (min) 45 min    Activity Tolerance Patient tolerated treatment well    Behavior During Therapy WFL for tasks assessed/performed             Past Medical History:  Diagnosis Date   Adenomatous colon polyp 06/13/2006   Allergy    SINUS   Anxiety    Anxiety and depression    Arthritis    Asthma    Bladder incontinence    Blood transfusion    Cataract    Depression    Fibromyalgia    GERD (gastroesophageal reflux disease)    History of kidney stones    Hyperlipidemia    Kidney stones    Obesity    Osteopenia    Post-operative nausea and vomiting    Past Surgical History:  Procedure Laterality Date   ABDOMINAL HYSTERECTOMY  1992   partial in [place]  2002   2002-2022 multi  x 6  fusions and rods   BREAST BIOPSY Bilateral 11/26/2017   FIBROCYSTIC CHANGES WITH CALCIFICATIONS    CERVICAL SPINE SURGERY  2014   with rod   COLONOSCOPY  06/12/2011   [place] Plan, 03/17/21 [place]   ELBOW SURGERY  2003   EYE SURGERY Bilateral    cataract   FOOT SURGERY  1994   right   KIDNEY STONE SURGERY  2004   KNEE ARTHROSCOPY Left 2008   LUMBAR FUSION     2002,2003,2007,2008,2010, 2022 with fusion and rods   NASAL SINUS SURGERY  6213   NISSEN FUNDOPLICATION  0865   ROTATOR CUFF REPAIR Bilateral [id number]   SPINAL CORD STIMULATOR IMPLANT  2005   SPINAL CORD STIMULATOR IMPLANT  2004   SPINAL CORD STIMULATOR REMOVAL  2008   TOTAL KNEE ARTHROPLASTY Left 2008,2009   TOTAL KNEE ARTHROPLASTY Right  05/07/2022   Procedure: TOTAL KNEE ARTHROPLASTY;  Surgeon: Gaynelle Arabian, MD;  Location: [facility];  Service: Orthopedics;  Laterality: Right;   Patient Active Problem List   Diagnosis Date Noted   Osteoarthritis of right knee 05/07/2022   Neck pain 03/15/2022   Back pain 03/15/2022   Symptomatic mammary hypertrophy 03/15/2022   Chronic pain syndrome 12/25/2021   Pure hypercholesterolemia 01/22/2020   Dysfunction of both eustachian tubes 04/06/2019   OA (osteoarthritis) of knee 12/30/2017   Depression, recurrent ([place]) 10/27/2017   Contact dermatitis and eczema due to plant 11/29/2016   Overactive bladder 11/29/2016   Frequent urination 01/23/2016   Urgency of urination 01/23/2016   Rash 01/23/2016   Urinary frequency 04/21/2015   Stress fracture of pelvis 11/03/2014   Disc degeneration, lumbosacral 11/03/2014   Osteopenia 11/03/2014   Severe obesity (BMI >= 40) ([place]) 11/03/2014   Essential hypertension 04/04/2009   Asthma 04/04/2009   MIGRAINES, HX OF 04/04/2009      REFERRING PROVIDER: Gaynelle Arabian MD  REFERRING DIAG: Right total knee arthroplasty.  THERAPY DIAG:  Chronic pain of  right knee  Stiffness of right knee, not elsewhere classified  Localized edema  Muscle weakness (generalized)  Rationale for Evaluation and Treatment Rehabilitation  ONSET DATE: 05/07/22 (surgery date).  SUBJECTIVE:   SUBJECTIVE STATEMENT: Patient reporting her left hip bursitis is bothering her more than her knee.  PAIN:  Are you having pain? Yes: NPRS scale: 6/10 Pain location: Right knee. Pain description: Ache, sore, throbbing, sharp, numb Aggravating factors: As above. Relieving factors: As above.  PRECAUTIONS: Other: No ultrasound.   PATIENT GOALS:  Not have knee pain.   OBJECTIVE:   TODAY'S TREATMENT: Nustep level 1 moving forward x 2 to increase flexion x 15 minutes.  In supine:  PROM x 5 minutes into patient's right knee into flexion and extension f/b SAQ's x 3  minutes with 2#.  Vasopneumatic to patient's right knee, elevated x 15 minutes.   ASSESSMENT:  CLINICAL IMPRESSION: Patient doing very well and her range of motion is improving nicely.  Improved quadriceps activation noted today.    GOALS:  SHORT TERM GOALS: Target date: 05/29/2022  Ind with initial HEP. Baseline: Goal status: INITIAL  2.  Achieve full active right knee extension. Baseline:  Goal status: INITIAL  LONG TERM GOALS: Target date: 06/12/2022   Active right knee flexion to 115 degrees+ so the patient can perform functional tasks and do so with pain not > 2-3/10. Baseline:  Goal status: INITIAL  2.  Increase right hip and knee strength to a solid 4+/5 to provide good stability for accomplishment of functional activities. Baseline:  Goal status: INITIAL  3.  Perform a reciprocating stair gait with one railing with pain not > 2-3/10. Baseline:  Goal status: INITIAL  4.  Decrease edema to within 2.5 cms of non-affected side to assist with pain reduction and range of motion gains. Baseline:  Goal status: INITIAL    PLAN: PT FREQUENCY:  2-3 times a week.  PT DURATION: 4 weeks  PLANNED INTERVENTIONS: Therapeutic exercises, Therapeutic activity, Neuromuscular re-education, Gait training, Patient/Family education, Self Care, Electrical stimulation, Cryotherapy, Moist heat, Vasopneumatic device, and Manual therapy  PLAN FOR NEXT SESSION: Nustep.  Progress into TKA protocol.   Luciano Cinquemani, [place], PT 05/15/2022, 10:39 AM

## 2022-05-17 ENCOUNTER — Ambulatory Visit: Payer: Medicare Other | Admitting: Physical Therapy

## 2022-05-17 ENCOUNTER — Encounter: Payer: Self-pay | Admitting: Physical Therapy

## 2022-05-17 DIAGNOSIS — M6281 Muscle weakness (generalized): Secondary | ICD-10-CM | POA: Diagnosis not present

## 2022-05-17 DIAGNOSIS — G8929 Other chronic pain: Secondary | ICD-10-CM | POA: Diagnosis not present

## 2022-05-17 DIAGNOSIS — M25561 Pain in right knee: Secondary | ICD-10-CM | POA: Diagnosis not present

## 2022-05-17 DIAGNOSIS — R600 Localized edema: Secondary | ICD-10-CM | POA: Diagnosis not present

## 2022-05-17 DIAGNOSIS — M25661 Stiffness of right knee, not elsewhere classified: Secondary | ICD-10-CM

## 2022-05-17 NOTE — Therapy (Addendum)
OUTPATIENT PHYSICAL THERAPY LOWER EXTREMITY TREATMENT   Patient Name: Valerie Baker MRN: [id number] DOB:12/27/1955, 66 y.o., female Today's Date: 05/17/2022   PT End of Session - 05/17/22 1037     Visit Number 3    Number of Visits 12    Date for PT Re-Evaluation 06/07/22    Authorization Type FOTO AT LEAST EVERY 5TH VISIT.  PROGRESS NOTE AT 10TH VISIT.  KX MODIFIER AFTER 15 VISITS.    PT Start Time 1032    PT Stop Time 1116    PT Time Calculation (min) 44 min    Activity Tolerance Patient tolerated treatment well    Behavior During Therapy WFL for tasks assessed/performed             Past Medical History:  Diagnosis Date   Adenomatous colon polyp 06/13/2006   Allergy    SINUS   Anxiety    Anxiety and depression    Arthritis    Asthma    Bladder incontinence    Blood transfusion    Cataract    Depression    Fibromyalgia    GERD (gastroesophageal reflux disease)    History of kidney stones    Hyperlipidemia    Kidney stones    Obesity    Osteopenia    Post-operative nausea and vomiting    Past Surgical History:  Procedure Laterality Date   ABDOMINAL HYSTERECTOMY  1992   partial in 1994   APPENDECTOMY  1975   BACK SURGERY  2002   2002-2022 multi  x 6  fusions and rods   BREAST BIOPSY Bilateral 11/26/2017   FIBROCYSTIC CHANGES WITH CALCIFICATIONS    CERVICAL SPINE SURGERY  2014   with rod   COLONOSCOPY  06/12/2011   [place], 03/17/21 MS   ELBOW SURGERY  2003   EYE SURGERY Bilateral    cataract   FOOT SURGERY  1994   right   KIDNEY STONE SURGERY  2004   KNEE ARTHROSCOPY Left 2008   LUMBAR FUSION     2002,2003,2007,2008,2010, 2022 with fusion and rods   NASAL SINUS SURGERY  2003   NISSEN FUNDOPLICATION  2004   ROTATOR CUFF REPAIR Bilateral 1993,2003   SPINAL CORD STIMULATOR IMPLANT  2005   SPINAL CORD STIMULATOR IMPLANT  2004   SPINAL CORD STIMULATOR REMOVAL  2008   TOTAL KNEE ARTHROPLASTY Left 2008,2009   TOTAL KNEE ARTHROPLASTY Right 05/07/2022    Procedure: TOTAL KNEE ARTHROPLASTY;  Surgeon: Aluisio, Frank, MD;  Location: [facility];  Service: Orthopedics;  Laterality: Right;   Patient Active Problem List   Diagnosis Date Noted   Osteoarthritis of right knee 05/07/2022   Neck pain 03/15/2022   Back pain 03/15/2022   Symptomatic mammary hypertrophy 03/15/2022   Chronic pain syndrome 12/25/2021   Pure hypercholesterolemia 01/22/2020   Dysfunction of both eustachian tubes 04/06/2019   OA (osteoarthritis) of knee 12/30/2017   Depression, recurrent (HCC) 10/27/2017   Contact dermatitis and eczema due to plant 11/29/2016   Overactive bladder 11/29/2016   Frequent urination 01/23/2016   Urgency of urination 01/23/2016   Rash 01/23/2016   Urinary frequency 04/21/2015   Stress fracture of pelvis 11/03/2014   Disc degeneration, lumbosacral 11/03/2014   Osteopenia 11/03/2014   Severe obesity (BMI >= 40) (HCC) 11/03/2014   Essential hypertension 04/04/2009   Asthma 04/04/2009   MIGRAINES, HX OF 04/04/2009   REFERRING PROVIDER: Frank Aluisio MD  REFERRING DIAG: Right total knee arthroplasty.  THERAPY DIAG:  Chronic pain of right knee    Stiffness of right knee, not elsewhere classified  Localized edema  Muscle weakness (generalized)  Rationale for Evaluation and Treatment Rehabilitation  ONSET DATE: 05/07/22 (surgery date).  SUBJECTIVE:   SUBJECTIVE STATEMENT: Patient reporting her left hip bursitis is bothering her but having more pain in general.  PAIN:  Are you having pain? Yes: NPRS scale: no number provided/10 Pain location: Right knee. Pain description: Ache, sore, throbbing, sharp, numb Aggravating factors: As above. Relieving factors: As above.  PRECAUTIONS: Other: No ultrasound.   PATIENT GOALS:  Not have knee pain.   OBJECTIVE:   TODAY'S TREATMENT:                                    EXERCISE LOG  Exercise Repetitions and Resistance Comments  Nustep L3, seat 9-7 x15 min   Hip flexion X20 reps    Hip abduction X20 reps   LAQ AROM x20 reps   Seated heel slides X20 reps   Seated HS stretch 4x20 sec    Blank cell = exercise not performed today   Modalities  Date:  Vaso: Knee, Low, 15 mins, Pain and Edema  ASSESSMENT:  CLINICAL IMPRESSION: Patient presented in clinic with reports of higher pain today as well as in L hip. Patient reports that she has started to wean from the FWW at home. Patient able to demonstrate great ROM with heel slides. AROM of R knee measured as 15-119 deg 10 days postop. Normal vasopnuematic response noted following removal of the modality.  GOALS:  SHORT TERM GOALS: Target date: 05/31/2022  Ind with initial HEP. Baseline: Goal status: On-going  2.  Achieve full active right knee extension. Baseline:  Goal status: On-going  LONG TERM GOALS: Target date: 06/14/2022   Active right knee flexion to 115 degrees+ so the patient can perform functional tasks and do so with pain not > 2-3/10. Baseline:  Goal status: MET  2.  Increase right hip and knee strength to a solid 4+/5 to provide good stability for accomplishment of functional activities. Baseline:  Goal status: On-going  3.  Perform a reciprocating stair gait with one railing with pain not > 2-3/10. Baseline:  Goal status: On-going  4.  Decrease edema to within 2.5 cms of non-affected side to assist with pain reduction and range of motion gains. Baseline:  Goal status: On-going  PLAN: PT FREQUENCY:  2-3 times a week.  PT DURATION: 4 weeks  PLANNED INTERVENTIONS: Therapeutic exercises, Therapeutic activity, Neuromuscular re-education, Gait training, Patient/Family education, Self Care, Electrical stimulation, Cryotherapy, Moist heat, Vasopneumatic device, and Manual therapy  PLAN FOR NEXT SESSION: Nustep.  Progress into TKA protocol.   Kelsey P Kennon, PTA 05/17/2022, 12:14 PM  

## 2022-05-21 ENCOUNTER — Encounter: Payer: Self-pay | Admitting: Physical Therapy

## 2022-05-21 ENCOUNTER — Ambulatory Visit: Payer: Medicare Other | Admitting: Physical Therapy

## 2022-05-21 DIAGNOSIS — M6281 Muscle weakness (generalized): Secondary | ICD-10-CM | POA: Diagnosis not present

## 2022-05-21 DIAGNOSIS — R600 Localized edema: Secondary | ICD-10-CM

## 2022-05-21 DIAGNOSIS — G8929 Other chronic pain: Secondary | ICD-10-CM | POA: Diagnosis not present

## 2022-05-21 DIAGNOSIS — M25661 Stiffness of right knee, not elsewhere classified: Secondary | ICD-10-CM | POA: Diagnosis not present

## 2022-05-21 DIAGNOSIS — M25561 Pain in right knee: Secondary | ICD-10-CM | POA: Diagnosis not present

## 2022-05-21 NOTE — Therapy (Signed)
OUTPATIENT PHYSICAL THERAPY LOWER EXTREMITY TREATMENT   Patient Name: PRINCE OLIVIER MRN: [id number] DOB:1956/08/03, 66 y.o., female Today's Date: 05/21/2022   PT End of Session - 05/21/22 1132     Visit Number 4    Number of Visits 12    Date for PT Re-Evaluation 06/07/22    Authorization Type FOTO AT LEAST EVERY 5TH VISIT.  PROGRESS NOTE AT 10TH VISIT.  KX MODIFIER AFTER 15 VISITS.    PT Start Time 1032    PT Stop Time 1114    PT Time Calculation (min) 42 min    Activity Tolerance Patient tolerated treatment well    Behavior During Therapy WFL for tasks assessed/performed              Past Medical History:  Diagnosis Date   Adenomatous colon polyp 06/13/2006   Allergy    SINUS   Anxiety    Anxiety and depression    Arthritis    Asthma    Bladder incontinence    Blood transfusion    Cataract    Depression    Fibromyalgia    GERD (gastroesophageal reflux disease)    History of kidney stones    Hyperlipidemia    Kidney stones    Obesity    Osteopenia    Post-operative nausea and vomiting    Past Surgical History:  Procedure Laterality Date   ABDOMINAL HYSTERECTOMY  1992   partial in [place]  2002   2002-2022 multi  x 6  fusions and rods   BREAST BIOPSY Bilateral 11/26/2017   FIBROCYSTIC CHANGES WITH CALCIFICATIONS    CERVICAL SPINE SURGERY  2014   with rod   COLONOSCOPY  06/12/2011   Fuller Plan, 03/17/21 MS   ELBOW SURGERY  2003   EYE SURGERY Bilateral    cataract   FOOT SURGERY  1994   right   KIDNEY STONE SURGERY  2004   KNEE ARTHROSCOPY Left 2008   LUMBAR FUSION     2002,2003,2007,2008,2010, 2022 with fusion and rods   NASAL SINUS SURGERY  8299   NISSEN FUNDOPLICATION  3716   ROTATOR CUFF REPAIR Bilateral [id number]   SPINAL CORD STIMULATOR IMPLANT  2005   SPINAL CORD STIMULATOR IMPLANT  2004   SPINAL CORD STIMULATOR REMOVAL  2008   TOTAL KNEE ARTHROPLASTY Left 2008,2009   TOTAL KNEE ARTHROPLASTY Right  05/07/2022   Procedure: TOTAL KNEE ARTHROPLASTY;  Surgeon: Gaynelle Arabian, MD;  Location: [facility];  Service: Orthopedics;  Laterality: Right;   Patient Active Problem List   Diagnosis Date Noted   Osteoarthritis of right knee 05/07/2022   Neck pain 03/15/2022   Back pain 03/15/2022   Symptomatic mammary hypertrophy 03/15/2022   Chronic pain syndrome 12/25/2021   Pure hypercholesterolemia 01/22/2020   Dysfunction of both eustachian tubes 04/06/2019   OA (osteoarthritis) of knee 12/30/2017   Depression, recurrent ([place]) 10/27/2017   Contact dermatitis and eczema due to plant 11/29/2016   Overactive bladder 11/29/2016   Frequent urination 01/23/2016   Urgency of urination 01/23/2016   Rash 01/23/2016   Urinary frequency 04/21/2015   Stress fracture of pelvis 11/03/2014   Disc degeneration, lumbosacral 11/03/2014   Osteopenia 11/03/2014   Severe obesity (BMI >= 40) ([place]) 11/03/2014   Essential hypertension 04/04/2009   Asthma 04/04/2009   MIGRAINES, HX OF 04/04/2009   REFERRING PROVIDER: Gaynelle Arabian MD  REFERRING DIAG: Right total knee arthroplasty.  THERAPY DIAG:  Chronic pain of right knee  Stiffness of right knee, not elsewhere classified  Localized edema  Muscle weakness (generalized)  Rationale for Evaluation and Treatment Rehabilitation  ONSET DATE: 05/07/22 (surgery date).  SUBJECTIVE:   SUBJECTIVE STATEMENT: Reports that she has been so busy with a recent death in the family and has not been propping or icing her knee as much.  PAIN:  Are you having pain? Yes: NPRS scale: 5/10 Pain location: Right knee. Pain description: Ache, sore, throbbing, sharp, numb Aggravating factors: As above. Relieving factors: As above.  PRECAUTIONS: Other: No ultrasound.   PATIENT GOALS:  Not have knee pain.   OBJECTIVE:   AROM Right 10/9  Hip flexion   Hip extension   Hip abduction   Hip adduction   Hip internal rotation   Hip external rotation   Knee flexion    Knee extension 10  Ankle dorsiflexion   Ankle plantarflexion   Ankle inversion   Ankle eversion    (Blank rows = not tested)   TODAY'S TREATMENT:                                    EXERCISE LOG  Exercise Repetitions and Resistance Comments  Nustep L3, seat 8-7 x15 min   LAQ AROM x20 reps   lunges 6" step x20 reps   Rockerboard  X3 min   Heel prop in extension X3 min    Blank cell = exercise not performed today   Modalities  Date: 10/9 Vaso: Knee, Low, 10 mins, Pain and Edema  ASSESSMENT:  CLINICAL IMPRESSION: Patient presented in clinic with reports of more discomfort today as she has been busy due to a death in the family. Patient had been dependent sitting a lot in recent days due to being with family. Patient able to tolerate therex fairly well although patient now experiencing R shoulder bicep pain as well. Patient able to demonstrate great R knee flexion but limited with knee extension with discomfort. Great bruising noted in posterior thigh. Has been trying to ambulate without AD but cautioned to not overdo activity. Normal vasopneumatic response noted following removal of the modality.  GOALS:  SHORT TERM GOALS: Target date: 06/04/2022  Ind with initial HEP. Baseline: Goal status: On-going  2.  Achieve full active right knee extension. Baseline:  Goal status: On-going  LONG TERM GOALS: Target date: 06/18/2022   Active right knee flexion to 115 degrees+ so the patient can perform functional tasks and do so with pain not > 2-3/10. Baseline:  Goal status: MET  2.  Increase right hip and knee strength to a solid 4+/5 to provide good stability for accomplishment of functional activities. Baseline:  Goal status: On-going  3.  Perform a reciprocating stair gait with one railing with pain not > 2-3/10. Baseline:  Goal status: On-going  4.  Decrease edema to within 2.5 cms of non-affected side to assist with pain reduction and range of motion gains. Baseline:   Goal status: On-going  PLAN: PT FREQUENCY:  2-3 times a week.  PT DURATION: 4 weeks  PLANNED INTERVENTIONS: Therapeutic exercises, Therapeutic activity, Neuromuscular re-education, Gait training, Patient/Family education, Self Care, Electrical stimulation, Cryotherapy, Moist heat, Vasopneumatic device, and Manual therapy  PLAN FOR NEXT SESSION: Nustep.  Progress into TKA protocol.   Standley Brooking, PTA 05/21/2022, 11:33 AM

## 2022-05-22 ENCOUNTER — Encounter: Payer: PRIVATE HEALTH INSURANCE | Admitting: Physical Therapy

## 2022-05-25 ENCOUNTER — Encounter: Payer: PRIVATE HEALTH INSURANCE | Admitting: Physical Therapy

## 2022-05-27 ENCOUNTER — Other Ambulatory Visit: Payer: Self-pay | Admitting: Family Medicine

## 2022-05-27 DIAGNOSIS — M5137 Other intervertebral disc degeneration, lumbosacral region: Secondary | ICD-10-CM

## 2022-05-27 DIAGNOSIS — K219 Gastro-esophageal reflux disease without esophagitis: Secondary | ICD-10-CM

## 2022-05-27 DIAGNOSIS — R350 Frequency of micturition: Secondary | ICD-10-CM

## 2022-05-29 ENCOUNTER — Encounter: Payer: Self-pay | Admitting: Physical Therapy

## 2022-05-29 ENCOUNTER — Ambulatory Visit: Payer: Medicare Other | Admitting: Physical Therapy

## 2022-05-29 DIAGNOSIS — G8929 Other chronic pain: Secondary | ICD-10-CM | POA: Diagnosis not present

## 2022-05-29 DIAGNOSIS — R600 Localized edema: Secondary | ICD-10-CM | POA: Diagnosis not present

## 2022-05-29 DIAGNOSIS — M25661 Stiffness of right knee, not elsewhere classified: Secondary | ICD-10-CM

## 2022-05-29 DIAGNOSIS — M25561 Pain in right knee: Secondary | ICD-10-CM | POA: Diagnosis not present

## 2022-05-29 DIAGNOSIS — M6281 Muscle weakness (generalized): Secondary | ICD-10-CM

## 2022-05-29 NOTE — Therapy (Signed)
OUTPATIENT PHYSICAL THERAPY LOWER EXTREMITY TREATMENT   Patient Name: Valerie Baker MRN: [id number] DOB:1956-02-22, 66 y.o., female Today's Date: 05/29/2022   PT End of Session - 05/29/22 1155     Visit Number 5    Number of Visits 12    Date for PT Re-Evaluation 06/07/22    Authorization Type FOTO AT LEAST EVERY 5TH VISIT.  PROGRESS NOTE AT 10TH VISIT.  KX MODIFIER AFTER 15 VISITS.    PT Start Time 1113    PT Stop Time 1202    PT Time Calculation (min) 49 min    Activity Tolerance Patient tolerated treatment well    Behavior During Therapy WFL for tasks assessed/performed              Past Medical History:  Diagnosis Date   Adenomatous colon polyp 06/13/2006   Allergy    SINUS   Anxiety    Anxiety and depression    Arthritis    Asthma    Bladder incontinence    Blood transfusion    Cataract    Depression    Fibromyalgia    GERD (gastroesophageal reflux disease)    History of kidney stones    Hyperlipidemia    Kidney stones    Obesity    Osteopenia    Post-operative nausea and vomiting    Past Surgical History:  Procedure Laterality Date   ABDOMINAL HYSTERECTOMY  1992   partial in [place]  2002   2002-2022 multi  x 6  fusions and rods   BREAST BIOPSY Bilateral 11/26/2017   FIBROCYSTIC CHANGES WITH CALCIFICATIONS    CERVICAL SPINE SURGERY  2014   with rod   COLONOSCOPY  06/12/2011   Fuller Plan, 03/17/21 MS   ELBOW SURGERY  2003   EYE SURGERY Bilateral    cataract   FOOT SURGERY  1994   right   KIDNEY STONE SURGERY  2004   KNEE ARTHROSCOPY Left 2008   LUMBAR FUSION     2002,2003,2007,2008,2010, 2022 with fusion and rods   NASAL SINUS SURGERY  7867   NISSEN FUNDOPLICATION  6720   ROTATOR CUFF REPAIR Bilateral [id number]   SPINAL CORD STIMULATOR IMPLANT  2005   SPINAL CORD STIMULATOR IMPLANT  2004   SPINAL CORD STIMULATOR REMOVAL  2008   TOTAL KNEE ARTHROPLASTY Left 2008,2009   TOTAL KNEE ARTHROPLASTY Right  05/07/2022   Procedure: TOTAL KNEE ARTHROPLASTY;  Surgeon: Gaynelle Arabian, MD;  Location: [facility];  Service: Orthopedics;  Laterality: Right;   Patient Active Problem List   Diagnosis Date Noted   Osteoarthritis of right knee 05/07/2022   Neck pain 03/15/2022   Back pain 03/15/2022   Symptomatic mammary hypertrophy 03/15/2022   Chronic pain syndrome 12/25/2021   Pure hypercholesterolemia 01/22/2020   Dysfunction of both eustachian tubes 04/06/2019   OA (osteoarthritis) of knee 12/30/2017   Depression, recurrent ([place]) 10/27/2017   Contact dermatitis and eczema due to plant 11/29/2016   Overactive bladder 11/29/2016   Frequent urination 01/23/2016   Urgency of urination 01/23/2016   Rash 01/23/2016   Urinary frequency 04/21/2015   Stress fracture of pelvis 11/03/2014   Disc degeneration, lumbosacral 11/03/2014   Osteopenia 11/03/2014   Severe obesity (BMI >= 40) ([place]) 11/03/2014   Essential hypertension 04/04/2009   Asthma 04/04/2009   MIGRAINES, HX OF 04/04/2009   REFERRING PROVIDER: Gaynelle Arabian MD  REFERRING DIAG: Right total knee arthroplasty.  THERAPY DIAG:  Chronic pain of right knee  Stiffness of right knee, not elsewhere classified  Localized edema  Muscle weakness (generalized)  Rationale for Evaluation and Treatment Rehabilitation  ONSET DATE: 05/07/22 (surgery date).  SUBJECTIVE:   SUBJECTIVE STATEMENT: Doctor was very pleased.  She presented to the clinic with a cane today.   PAIN:  Are you having pain? Yes: NPRS scale: 3/10 Pain location: Right knee. Pain description: Ache, sore, throbbing, sharp, numb Aggravating factors: As above. Relieving factors: As above.  PRECAUTIONS: Other: No ultrasound.   PATIENT GOALS:  Not have knee pain.   OBJECTIVE:   TODAY'S TREATMENT:  05/29/22                                    EXERCISE LOG  Exercise Repetitions and Resistance Comments  Nustep L3, moving seat forward x 1 to increase flexion x 10 mins    Recumbent bike X 5 minutes on seat 4.   Knee ext 10 # x 3 minutes.   Ham curls 30# x 3 minutes       Manual:  3 minute right knee overpressure stretch.  Modalities  Date: 10/9 Vaso: Knee, Low, 15 mins, Pain and Edema  ASSESSMENT:  CLINICAL IMPRESSION: Patient did a great job today.  She presented to the clinic with a lowered pain-level (her right shoulder and hips hurt more than her right knee) and using a cane.  She was able to perform the recumbent bike on seat 5 making forward revolutions and began weight machines today without complaint and excellent technique.  She achieved active right knee flexion to 125 degrees today. GOALS:  SHORT TERM GOALS: Target date: 06/12/2022  Ind with initial HEP. Baseline: Goal status: On-going  2.  Achieve full active right knee extension. Baseline:  Goal status: On-going  LONG TERM GOALS: Target date: 06/26/2022   Active right knee flexion to 115 degrees+ so the patient can perform functional tasks and do so with pain not > 2-3/10. Baseline:  Goal status: MET  2.  Increase right hip and knee strength to a solid 4+/5 to provide good stability for accomplishment of functional activities. Baseline:  Goal status: On-going  3.  Perform a reciprocating stair gait with one railing with pain not > 2-3/10. Baseline:  Goal status: On-going  4.  Decrease edema to within 2.5 cms of non-affected side to assist with pain reduction and range of motion gains. Baseline:  Goal status: On-going  PLAN: PT FREQUENCY:  2-3 times a week.  PT DURATION: 4 weeks  PLANNED INTERVENTIONS: Therapeutic exercises, Therapeutic activity, Neuromuscular re-education, Gait training, Patient/Family education, Self Care, Electrical stimulation, Cryotherapy, Moist heat, Vasopneumatic device, and Manual therapy  PLAN FOR NEXT SESSION: Nustep.  Progress into TKA protocol.   Shaday Rayborn, [place], PT 05/29/2022, 12:22 PM

## 2022-05-31 ENCOUNTER — Encounter: Payer: PRIVATE HEALTH INSURANCE | Admitting: *Deleted

## 2022-06-06 ENCOUNTER — Encounter: Payer: PRIVATE HEALTH INSURANCE | Admitting: Physical Therapy

## 2022-06-12 DIAGNOSIS — Z5189 Encounter for other specified aftercare: Secondary | ICD-10-CM | POA: Diagnosis not present

## 2022-06-12 DIAGNOSIS — M7062 Trochanteric bursitis, left hip: Secondary | ICD-10-CM | POA: Diagnosis not present

## 2022-06-15 ENCOUNTER — Encounter: Payer: Self-pay | Admitting: Family Medicine

## 2022-06-15 ENCOUNTER — Ambulatory Visit (INDEPENDENT_AMBULATORY_CARE_PROVIDER_SITE_OTHER): Payer: Medicare Other | Admitting: Family Medicine

## 2022-06-15 DIAGNOSIS — F1190 Opioid use, unspecified, uncomplicated: Secondary | ICD-10-CM

## 2022-06-15 DIAGNOSIS — G8929 Other chronic pain: Secondary | ICD-10-CM | POA: Diagnosis not present

## 2022-06-15 DIAGNOSIS — M25561 Pain in right knee: Secondary | ICD-10-CM

## 2022-06-15 DIAGNOSIS — M25562 Pain in left knee: Secondary | ICD-10-CM

## 2022-06-15 MED ORDER — OXYCODONE-ACETAMINOPHEN 7.5-325 MG PO TABS: 1.0000 | ORAL_TABLET | Freq: Two times a day (BID) | ORAL | 0 refills | Status: DC | PRN

## 2022-06-15 NOTE — Progress Notes (Signed)
Subjective: CC: Follow-up chronic pain management PCP: Janora Norlander, DO [id number] Valerie Baker is a 66 y.o. female presenting to clinic today for:  1.  Chronic pain management for chronic knee pain and DDD Patient is currently being treated with 5 mg of oxycodone up to 4 times daily as needed by her orthopedist.  She had knee surgery done and has completed physical therapy.  She does note that when she was in physical therapy they are having her do exercises on some type of incline which exacerbated her low back pain.  Unfortunately, her home Percocet was not helping this pain so he did put her on a higher regimen and she is now being weaned off of that high-dose.  She denies any complications including dizziness, excessive daytime fatigue, constipation or respiratory depression.  Back pain is still somewhat aggravating but she is trying to work through it   ROS: Per HPI  Allergies  Allergen Reactions   Latex Itching and Rash   Sulfonamide Derivatives Rash   Past Medical History:  Diagnosis Date   Adenomatous colon polyp 06/13/2006   Allergy    SINUS   Anxiety    Anxiety and depression    Arthritis    Asthma    Bladder incontinence    Blood transfusion    Cataract    Depression    Fibromyalgia    GERD (gastroesophageal reflux disease)    History of kidney stones    Hyperlipidemia    Kidney stones    Obesity    Osteopenia    Post-operative nausea and vomiting     Current Outpatient Medications:    albuterol (PROVENTIL HFA;VENTOLIN HFA) 108 (90 Base) MCG/ACT inhaler, Inhale 2 puffs into the lungs every 6 (six) hours as needed for wheezing or shortness of breath., Disp: 1 Inhaler, Rfl: 0   ascorbic acid (VITAMIN C) 500 MG tablet, Take 500 mg by mouth daily., Disp: , Rfl:    baclofen (LIORESAL) 10 MG tablet, TAKE 1/2 TO 1 TABLET BY  MOUTH 3 TIMES DAILY AS  NEEDED FOR MUSCLE SPASMS (Patient taking differently: Take 10 mg by mouth 3 (three) times daily.), Disp: 270  tablet, Rfl: 3   buPROPion ER (WELLBUTRIN SR) 100 MG 12 hr tablet, Take 1 tablet (100 mg total) by mouth in the morning., Disp: 90 tablet, Rfl: 3   Calcium Carbonate-Vitamin D 600-400 MG-UNIT tablet, Take 1 tablet by mouth 2 (two) times daily., Disp: , Rfl:    Cholecalciferol (VITAMIN D3) 2000 UNITS TABS, Take 2,000 Units by mouth 2 (two) times daily., Disp: , Rfl:    cycloSPORINE (RESTASIS) 0.05 % ophthalmic emulsion, Place 1 drop into both eyes 2 (two) times daily., Disp: , Rfl:    DULoxetine (CYMBALTA) 60 MG capsule, Take 1 capsule (60 mg total) by mouth daily., Disp: 90 capsule, Rfl: 3   fluticasone (FLONASE) 50 MCG/ACT nasal spray, Place 2 sprays into both nostrils daily. (Patient taking differently: Place 2 sprays into both nostrils at bedtime.), Disp: 16 g, Rfl: 6   gabapentin (NEURONTIN) 400 MG capsule, TAKE 2 CAPSULES BY MOUTH 3 TIMES DAILY, Disp: 600 capsule, Rfl: 0   loratadine (CLARITIN) 10 MG tablet, Take 10 mg by mouth daily., Disp: , Rfl:    Multiple Vitamin (MULTIVITAMIN) tablet, Take 1 tablet by mouth daily., Disp: , Rfl:    omeprazole (PRILOSEC) 40 MG capsule, TAKE 1 CAPSULE BY MOUTH TWICE  DAILY, Disp: 200 capsule, Rfl: 0   oxybutynin (DITROPAN) 5 MG tablet, TAKE  1 TABLET BY MOUTH TWICE  DAILY, Disp: 200 tablet, Rfl: 0   oxyCODONE 10 MG TABS, Take 1 tablet (10 mg total) by mouth every 6 (six) hours as needed for severe pain (not responding to chronic percocet)., Disp: 42 tablet, Rfl: 0   oxyCODONE-acetaminophen (PERCOCET) 7.5-325 MG tablet, Take 1 tablet by mouth 2 (two) times daily as needed for severe pain., Disp: 60 tablet, Rfl: 0   Povidone, PF, (IVIZIA DRY EYES) 0.5 % SOLN, Place 1 drop into both eyes every evening., Disp: , Rfl:    traZODone (DESYREL) 100 MG tablet, Take 1 tablet (100 mg total) by mouth at bedtime. As needed for sleep/ insomnia (Patient taking differently: Take 50 mg by mouth at bedtime.), Disp: 90 tablet, Rfl: 3   zinc gluconate 50 MG tablet, Take 50 mg by  mouth daily., Disp: , Rfl:    oxyCODONE-acetaminophen (PERCOCET) 7.5-325 MG tablet, Take 1 tablet by mouth 2 (two) times daily as needed for severe pain. (Patient not taking: Reported on 04/23/2022), Disp: 60 tablet, Rfl: 0 Social History   Socioeconomic History   Marital status: Married    Spouse name: Not on file   Number of children: 3   Years of education: Not on file   Highest education level: Not on file  Occupational History   Occupation: Disabled  Tobacco Use   Smoking status: Former    Packs/day: 2.00    Years: 5.00    Total pack years: 10.00    Types: Cigarettes    Quit date: 11/02/2005    Years since quitting: 16.6   Smokeless tobacco: Never  Vaping Use   Vaping Use: Never used  Substance and Sexual Activity   Alcohol use: No   Drug use: No   Sexual activity: Yes  Other Topics Concern   Not on file  Social History Narrative   Not on file   Social Determinants of Health   Financial Resource Strain: Low Risk  (07/17/2017)   Overall Financial Resource Strain (CARDIA)    Difficulty of Paying Living Expenses: Not hard at all  Food Insecurity: No Food Insecurity (05/07/2022)   Hunger Vital Sign    Worried About Running Out of Food in the Last Year: Never true    [place] in the Last Year: Never true  Transportation Needs: No Transportation Needs (05/07/2022)   PRAPARE - Hydrologist (Medical): No    Lack of Transportation (Non-Medical): No  Physical Activity: Unknown (07/17/2017)   Exercise Vital Sign    Days of Exercise per Week: 0 days    Minutes of Exercise per Session: Not on file  Stress: No Stress Concern Present (07/17/2017)   [place]    Feeling of Stress : Not at all  Social Connections: [place] (07/17/2017)   Social Connection and Isolation Panel [NHANES]    Frequency of Communication with Friends and Family: More than three times a week     Frequency of Social Gatherings with Friends and Family: More than three times a week    Attends Religious Services: More than 4 times per year    Active Member of [employer] or Organizations: Yes    Attends Archivist Meetings: More than 4 times per year    Marital Status: Married  Human resources officer Violence: Not At Risk (05/07/2022)   Humiliation, Afraid, Rape, and Kick questionnaire    Fear of Current or Ex-Partner: No  Emotionally Abused: No    Physically Abused: No    Sexually Abused: No   Family History  Problem Relation Age of Onset   Heart disease Mother    Diabetes Mother    Melanoma Mother        nose   Stroke Father    Colon polyps Sister    Hypertension Sister    Skin cancer Sister    Diabetes Brother    Cirrhosis Brother    Hypertension Brother    Colon polyps Brother    Hypertension Brother    Stomach cancer Other    Rectal cancer Other    Esophageal cancer Other    Colon cancer Other     Objective: Office vital signs reviewed. BP 139/74   Pulse 63   Temp 98.3 F (36.8 C)   Wt 205 lb 6.4 oz (93.2 kg)   SpO2 97%   BMI 38.81 kg/m   Physical Examination:  General: Awake, alert, morbidly obese, No acute distress HEENT: sclera white, MMM Cardio: regular rate and rhythm, S1S2 heard, no murmurs appreciated Pulm: clear to auscultation bilaterally, no wheezes, rhonchi or rales; normal work of breathing on room air MSK: Ambulating independently with slightly antalgic gait  Assessment/ Plan: 66 y.o. female   Bilateral chronic knee pain - Plan: oxyCODONE-acetaminophen (PERCOCET) 7.5-325 MG tablet, oxyCODONE-acetaminophen (PERCOCET) 7.5-325 MG tablet  Chronic, continuous use of opioids - Plan: oxyCODONE-acetaminophen (PERCOCET) 7.5-325 MG tablet, oxyCODONE-acetaminophen (PERCOCET) 7.5-325 MG tablet  Pain is chronic and essentially stable.  She is currently taking an advanced dose of oxycodone as prescribed by orthopedics status post surgical  intervention.  She has completed physical therapy.  She will resume her normal chronic pain medications. She is up-to-date on UDS and CSC  May follow-up in 3 months, sooner if concerns arise  No orders of the defined types were placed in this encounter.  No orders of the defined types were placed in this encounter.    Janora Norlander, DO [place] [phone]

## 2022-06-18 ENCOUNTER — Ambulatory Visit (INDEPENDENT_AMBULATORY_CARE_PROVIDER_SITE_OTHER): Payer: Medicare Other | Admitting: *Deleted

## 2022-06-18 DIAGNOSIS — Z23 Encounter for immunization: Secondary | ICD-10-CM | POA: Diagnosis not present

## 2022-06-19 ENCOUNTER — Ambulatory Visit: Payer: Medicare Other

## 2022-06-20 DIAGNOSIS — M25511 Pain in right shoulder: Secondary | ICD-10-CM | POA: Diagnosis not present

## 2022-06-20 DIAGNOSIS — M25512 Pain in left shoulder: Secondary | ICD-10-CM | POA: Diagnosis not present

## 2022-07-04 ENCOUNTER — Ambulatory Visit (INDEPENDENT_AMBULATORY_CARE_PROVIDER_SITE_OTHER): Payer: Medicare Other | Admitting: Family Medicine

## 2022-07-04 DIAGNOSIS — L438 Other lichen planus: Secondary | ICD-10-CM

## 2022-07-04 MED ORDER — FLUOCINONIDE 0.05 % EX GEL: CUTANEOUS | 1 refills | Status: DC

## 2022-07-04 NOTE — Progress Notes (Signed)
Telephone visit  Subjective: CC: blister PCP: Janora Norlander, DO ZOX:[id number] Valerie Baker is a 66 y.o. female calls for telephone consult today. Patient provides verbal consent for consult held via phone.  Due to COVID-19 pandemic this visit was conducted virtually. This visit type was conducted due to national recommendations for restrictions regarding the COVID-19 Pandemic (e.g. social distancing, sheltering in place) in an effort to limit this patient's exposure and mitigate transmission in our community. All issues noted in this document were discussed and addressed.  A physical exam was not performed with this format.   Location of patient: home Location of provider: [facility] Others present for call: none  1. Blister in mouth She went to ENT and she was told lichen planus in her mouth.  He prescribed fluocinolide gel for flare ups.  This has worked really well for her in the past and she is requesting a refill as her current one is expired.  She started having symptoms again in the last couple of days.  She is hydrating without difficulty and does not report any fevers.   ROS: Per HPI  Allergies  Allergen Reactions   Latex Itching and Rash   Sulfonamide Derivatives Rash   Past Medical History:  Diagnosis Date   Adenomatous colon polyp 06/13/2006   Allergy    SINUS   Anxiety    Anxiety and depression    Arthritis    Asthma    Bladder incontinence    Blood transfusion    Cataract    Depression    Fibromyalgia    GERD (gastroesophageal reflux disease)    History of kidney stones    Hyperlipidemia    Kidney stones    Obesity    Osteopenia    Post-operative nausea and vomiting     Current Outpatient Medications:    albuterol (PROVENTIL HFA;VENTOLIN HFA) 108 (90 Base) MCG/ACT inhaler, Inhale 2 puffs into the lungs every 6 (six) hours as needed for wheezing or shortness of breath., Disp: 1 Inhaler, Rfl: 0   ascorbic acid (VITAMIN C) 500 MG tablet, Take 500 mg by mouth  daily., Disp: , Rfl:    baclofen (LIORESAL) 10 MG tablet, TAKE 1/2 TO 1 TABLET BY  MOUTH 3 TIMES DAILY AS  NEEDED FOR MUSCLE SPASMS (Patient taking differently: Take 10 mg by mouth 3 (three) times daily.), Disp: 270 tablet, Rfl: 3   buPROPion ER (WELLBUTRIN SR) 100 MG 12 hr tablet, Take 1 tablet (100 mg total) by mouth in the morning., Disp: 90 tablet, Rfl: 3   Calcium Carbonate-Vitamin D 600-400 MG-UNIT tablet, Take 1 tablet by mouth 2 (two) times daily., Disp: , Rfl:    Cholecalciferol (VITAMIN D3) 2000 UNITS TABS, Take 2,000 Units by mouth 2 (two) times daily., Disp: , Rfl:    cycloSPORINE (RESTASIS) 0.05 % ophthalmic emulsion, Place 1 drop into both eyes 2 (two) times daily., Disp: , Rfl:    DULoxetine (CYMBALTA) 60 MG capsule, Take 1 capsule (60 mg total) by mouth daily., Disp: 90 capsule, Rfl: 3   fluticasone (FLONASE) 50 MCG/ACT nasal spray, Place 2 sprays into both nostrils daily. (Patient taking differently: Place 2 sprays into both nostrils at bedtime.), Disp: 16 g, Rfl: 6   gabapentin (NEURONTIN) 400 MG capsule, TAKE 2 CAPSULES BY MOUTH 3 TIMES DAILY, Disp: 600 capsule, Rfl: 0   loratadine (CLARITIN) 10 MG tablet, Take 10 mg by mouth daily., Disp: , Rfl:    Multiple Vitamin (MULTIVITAMIN) tablet, Take 1 tablet by mouth daily.,  Disp: , Rfl:    omeprazole (PRILOSEC) 40 MG capsule, TAKE 1 CAPSULE BY MOUTH TWICE  DAILY, Disp: 200 capsule, Rfl: 0   oxybutynin (DITROPAN) 5 MG tablet, TAKE 1 TABLET BY MOUTH TWICE  DAILY, Disp: 200 tablet, Rfl: 0   oxyCODONE 10 MG TABS, Take 1 tablet (10 mg total) by mouth every 6 (six) hours as needed for severe pain (not responding to chronic percocet)., Disp: 42 tablet, Rfl: 0   [START ON 08/14/2022] oxyCODONE-acetaminophen (PERCOCET) 7.5-325 MG tablet, Take 1 tablet by mouth 2 (two) times daily as needed for severe pain., Disp: 60 tablet, Rfl: 0   oxyCODONE-acetaminophen (PERCOCET) 7.5-325 MG tablet, Take 1 tablet by mouth 2 (two) times daily as needed for severe  pain., Disp: 60 tablet, Rfl: 0   [START ON 07/15/2022] oxyCODONE-acetaminophen (PERCOCET) 7.5-325 MG tablet, Take 1 tablet by mouth 2 (two) times daily as needed for severe pain., Disp: 60 tablet, Rfl: 0   Povidone, PF, (IVIZIA DRY EYES) 0.5 % SOLN, Place 1 drop into both eyes every evening., Disp: , Rfl:    traZODone (DESYREL) 100 MG tablet, Take 1 tablet (100 mg total) by mouth at bedtime. As needed for sleep/ insomnia (Patient taking differently: Take 50 mg by mouth at bedtime.), Disp: 90 tablet, Rfl: 3   zinc gluconate 50 MG tablet, Take 50 mg by mouth daily., Disp: , Rfl:   Assessment/ Plan: 66 y.o. female   Oral lichen planus - Plan: fluocinonide gel (LIDEX) 0.05 %  Patient with known oral lichen planus.  Previously prescribed Lidex gel to apply orally by her specialist.  I have renewed this.  She understands how to utilize this appropriately.  If symptoms are refractory to this previously prescribed treatment we will plan to refer her back to the ENT.  Start time: 10:02am End time: 10:07am  Total time spent on patient care (including telephone call/ virtual visit): 5 minutes  [place], [place] [phone]

## 2022-08-01 DIAGNOSIS — M791 Myalgia, unspecified site: Secondary | ICD-10-CM | POA: Diagnosis not present

## 2022-08-01 DIAGNOSIS — M4325 Fusion of spine, thoracolumbar region: Secondary | ICD-10-CM | POA: Diagnosis not present

## 2022-08-01 DIAGNOSIS — M542 Cervicalgia: Secondary | ICD-10-CM | POA: Diagnosis not present

## 2022-08-23 ENCOUNTER — Telehealth: Payer: Self-pay

## 2022-08-23 NOTE — Telephone Encounter (Signed)
Called [employer], spoke with [place] T. Could not extend authorization date 09/13-12/23 [id number].  Pending for new dates 09/13/2022-01/15/2023 [id number].

## 2022-08-24 ENCOUNTER — Other Ambulatory Visit: Payer: Self-pay | Admitting: Orthopaedic Surgery

## 2022-08-24 DIAGNOSIS — M542 Cervicalgia: Secondary | ICD-10-CM

## 2022-08-27 ENCOUNTER — Other Ambulatory Visit: Payer: Self-pay | Admitting: Family Medicine

## 2022-08-27 DIAGNOSIS — F339 Major depressive disorder, recurrent, unspecified: Secondary | ICD-10-CM

## 2022-08-28 ENCOUNTER — Telehealth: Payer: Self-pay | Admitting: *Deleted

## 2022-08-28 NOTE — Telephone Encounter (Signed)
New auth submitted for 2 units 19318 pend - [id number]

## 2022-08-31 ENCOUNTER — Ambulatory Visit
Admission: RE | Admit: 2022-08-31 | Discharge: 2022-08-31 | Disposition: A | Discharge status: Home / Self Care | Payer: Medicare Other | Source: Ambulatory Visit | Attending: Orthopaedic Surgery | Admitting: Orthopaedic Surgery

## 2022-08-31 DIAGNOSIS — M4802 Spinal stenosis, cervical region: Secondary | ICD-10-CM | POA: Diagnosis not present

## 2022-08-31 DIAGNOSIS — M542 Cervicalgia: Secondary | ICD-10-CM

## 2022-08-31 DIAGNOSIS — Z981 Arthrodesis status: Secondary | ICD-10-CM | POA: Diagnosis not present

## 2022-08-31 DIAGNOSIS — M5021 Other cervical disc displacement,  high cervical region: Secondary | ICD-10-CM | POA: Diagnosis not present

## 2022-08-31 MED ORDER — IOPAMIDOL (ISOVUE-M 300) INJECTION 61%
10.0000 mL | Freq: Once | INTRAMUSCULAR | Status: AC | PRN
  Administered 2022-08-31: 10 mL via INTRATHECAL

## 2022-08-31 MED ORDER — ONDANSETRON HCL 4 MG PO TABS
4.0000 mg | ORAL_TABLET | Freq: Once | ORAL | Status: AC
  Administered 2022-08-31: 4 mg via ORAL

## 2022-08-31 MED ORDER — MEPERIDINE HCL 50 MG/ML IJ SOLN: 50.0000 mg | Freq: Once | INTRAMUSCULAR | Status: DC | PRN

## 2022-08-31 MED ORDER — DIAZEPAM 5 MG PO TABS
5.0000 mg | ORAL_TABLET | Freq: Once | ORAL | Status: AC
  Administered 2022-08-31: 5 mg via ORAL

## 2022-08-31 MED ORDER — ONDANSETRON HCL 4 MG/2ML IJ SOLN: 4.0000 mg | Freq: Once | INTRAMUSCULAR | Status: DC | PRN

## 2022-08-31 NOTE — Discharge Instructions (Signed)

## 2022-08-31 NOTE — Discharge Instr - Other Info (Signed)
1558: Pt reports she is nauseous post myelogram procedure, '4mg'$  oral Zofran given, see MAR.

## 2022-09-03 ENCOUNTER — Other Ambulatory Visit: Payer: Self-pay | Admitting: Family Medicine

## 2022-09-03 DIAGNOSIS — M5137 Other intervertebral disc degeneration, lumbosacral region: Secondary | ICD-10-CM

## 2022-09-03 DIAGNOSIS — K219 Gastro-esophageal reflux disease without esophagitis: Secondary | ICD-10-CM

## 2022-09-03 DIAGNOSIS — R350 Frequency of micturition: Secondary | ICD-10-CM

## 2022-09-11 ENCOUNTER — Ambulatory Visit (INDEPENDENT_AMBULATORY_CARE_PROVIDER_SITE_OTHER): Payer: Medicare Other | Admitting: Family

## 2022-09-11 ENCOUNTER — Encounter: Payer: Self-pay | Admitting: Family

## 2022-09-11 VITALS — BP 131/76 | HR 80 | Temp 97.7°F | Ht 61.0 in | Wt 212.0 lb

## 2022-09-11 DIAGNOSIS — J208 Acute bronchitis due to other specified organisms: Secondary | ICD-10-CM

## 2022-09-11 DIAGNOSIS — B9689 Other specified bacterial agents as the cause of diseases classified elsewhere: Secondary | ICD-10-CM | POA: Diagnosis not present

## 2022-09-11 MED ORDER — AZITHROMYCIN 250 MG PO TABS: ORAL_TABLET | ORAL | 0 refills | Status: DC

## 2022-09-11 MED ORDER — PREDNISONE 10 MG (21) PO TBPK: ORAL_TABLET | ORAL | 0 refills | Status: DC

## 2022-09-11 MED ORDER — BENZONATATE 200 MG PO CAPS: 200.0000 mg | ORAL_CAPSULE | Freq: Three times a day (TID) | ORAL | 1 refills | Status: DC | PRN

## 2022-09-11 NOTE — Progress Notes (Signed)
Subjective:    Patient ID: Valerie Baker, female    DOB: 1956-01-09, 67 y.o.   MRN: [id number]  Chief Complaint  Patient presents with   Bronchitis   PT presents to the office today with cough for the last week.  Cough This is a new problem. The current episode started in the past 7 days. The problem has been gradually worsening. The problem occurs every few minutes. The cough is Productive of brown sputum. Associated symptoms include chills, headaches, nasal congestion and wheezing. Pertinent negatives include no ear congestion, ear pain, fever, myalgias or shortness of breath. She has tried rest and OTC cough suppressant for the symptoms. The treatment provided mild relief. Her past medical history is significant for asthma.      Review of Systems  Constitutional:  Positive for chills. Negative for fever.  HENT:  Negative for ear pain.   Respiratory:  Positive for cough and wheezing. Negative for shortness of breath.   Musculoskeletal:  Negative for myalgias.  Neurological:  Positive for headaches.  All other systems reviewed and are negative.      Objective:   Physical Exam Vitals reviewed.  Constitutional:      General: She is not in acute distress.    Appearance: She is well-developed. She is obese.  HENT:     Head: Normocephalic and atraumatic.     Right Ear: Tympanic membrane normal.     Left Ear: Tympanic membrane normal.  Eyes:     Pupils: Pupils are equal, round, and reactive to light.  Neck:     Thyroid: No thyromegaly.  Cardiovascular:     Rate and Rhythm: Normal rate and regular rhythm.     Heart sounds: Normal heart sounds. No murmur heard. Pulmonary:     Effort: Pulmonary effort is normal. No respiratory distress.     Breath sounds: Wheezing present.     Comments: Tight constant cough Abdominal:     General: Bowel sounds are normal. There is no distension.     Palpations: Abdomen is soft.     Tenderness: There is no abdominal tenderness.   Musculoskeletal:        General: No tenderness. Normal range of motion.     Cervical back: Normal range of motion and neck supple.  Skin:    General: Skin is warm and dry.  Neurological:     Mental Status: She is alert and oriented to person, place, and time.     Cranial Nerves: No cranial nerve deficit.     Deep Tendon Reflexes: Reflexes are normal and symmetric.  Psychiatric:        Behavior: Behavior normal.        Thought Content: Thought content normal.        Judgment: Judgment normal.     BP 131/76   Pulse 80   Temp 97.7 F (36.5 C) (Temporal)   Ht '5\' 1"'$  (1.549 m)   Wt 212 lb (96.2 kg)   SpO2 96%   BMI 40.06 kg/m        Assessment & Plan:  SAAVI MCEACHRON comes in today with chief complaint of Bronchitis   Diagnosis and orders addressed:  1. Acute bacterial bronchitis - Take meds as prescribed - Use a cool mist humidifier  -Use saline nose sprays frequently -Force fluids -For any cough or congestion  Use plain Mucinex- regular strength or max strength is fine -For fever or aces or pains- take tylenol or ibuprofen. -Throat lozenges if help -  Follow up if symptoms worsen or do not improve  - azithromycin (ZITHROMAX) 250 MG tablet; Take 500 mg once, then 250 mg for four days  Dispense: 6 tablet; Refill: 0 - predniSONE (STERAPRED UNI-PAK 21 TAB) 10 MG (21) TBPK tablet; Use as directed  Dispense: 21 tablet; Refill: 0 - benzonatate (TESSALON) 200 MG capsule; Take 1 capsule (200 mg total) by mouth 3 (three) times daily as needed.  Dispense: 30 capsule; Refill: [place], FNP

## 2022-09-11 NOTE — Patient Instructions (Signed)

## 2022-09-19 ENCOUNTER — Encounter: Payer: Self-pay | Admitting: Family Medicine

## 2022-09-19 ENCOUNTER — Ambulatory Visit (INDEPENDENT_AMBULATORY_CARE_PROVIDER_SITE_OTHER): Payer: Medicare Other | Admitting: Family Medicine

## 2022-09-19 VITALS — BP 140/74 | HR 67 | Temp 97.9°F | Ht 61.0 in | Wt 217.0 lb

## 2022-09-19 DIAGNOSIS — R059 Cough, unspecified: Secondary | ICD-10-CM | POA: Diagnosis not present

## 2022-09-19 DIAGNOSIS — B9689 Other specified bacterial agents as the cause of diseases classified elsewhere: Secondary | ICD-10-CM

## 2022-09-19 DIAGNOSIS — J208 Acute bronchitis due to other specified organisms: Secondary | ICD-10-CM

## 2022-09-19 MED ORDER — FLUTICASONE PROPIONATE 50 MCG/ACT NA SUSP: 2.0000 | Freq: Every day | NASAL | 2 refills | Status: DC

## 2022-09-19 MED ORDER — PROMETHAZINE-DM 6.25-15 MG/5ML PO SYRP: 5.0000 mL | ORAL_SOLUTION | Freq: Four times a day (QID) | ORAL | 0 refills | Status: DC | PRN

## 2022-09-19 MED ORDER — AMOXICILLIN-POT CLAVULANATE 875-125 MG PO TABS: 1.0000 | ORAL_TABLET | Freq: Two times a day (BID) | ORAL | 0 refills | Status: DC

## 2022-09-19 NOTE — Progress Notes (Signed)
BP (!) 140/74   Pulse 67   Temp 97.9 F (36.6 C)   Ht '5\' 1"'$  (1.549 m)   Wt 217 lb (98.4 kg)   SpO2 100%   BMI 41.00 kg/m    Subjective:   Patient ID: Valerie Baker, female    DOB: 06-Jul-1956, 67 y.o.   MRN: [id number]  HPI: Valerie Baker is a 67 y.o. female presenting on 09/19/2022 for Cough (Productive. Present for 2 weeks. Has improved since seeing [facility] and completing ATB, Prednisone. Would like Rx for cough syrup.)   HPI Patient is coming in today for continued cough and congestion and wheezing.  She was seen last week and was treated with antibiotic and prednisone, she was given azithromycin and Tessalon Perles and she said the pills are not working and she still coughing a lot and it does not seem to be improving.  She is also been using her inhalers and Flonase as well and just does not seem to be getting over this illness.  She denies any shortness of breath or fevers or chills or bodyaches.  She says the cough has been the pain and it just will not clear.  Sometimes it keeps her up at night as well.  She says has improved a little bit but not much  Relevant past medical, surgical, family and social history reviewed and updated as indicated. Interim medical history since our last visit reviewed. Allergies and medications reviewed and updated.  Review of Systems  Constitutional:  Negative for chills and fever.  HENT:  Positive for congestion, postnasal drip and rhinorrhea. Negative for ear discharge, ear pain, sinus pressure, sneezing and sore throat.   Eyes:  Negative for pain, redness and visual disturbance.  Respiratory:  Positive for cough and wheezing. Negative for chest tightness and shortness of breath.   Cardiovascular:  Negative for chest pain and leg swelling.  Genitourinary:  Negative for difficulty urinating and dysuria.  Musculoskeletal:  Negative for back pain and gait problem.  Skin:  Negative for rash.  Neurological:  Negative for light-headedness  and headaches.  Psychiatric/Behavioral:  Negative for agitation and behavioral problems.   All other systems reviewed and are negative.   Per HPI unless specifically indicated above   Allergies as of 09/19/2022       Reactions   Latex Itching, Rash   Sulfonamide Derivatives Rash        Medication List        Accurate as of September 19, 2022  9:46 AM. If you have any questions, ask your nurse or doctor.          STOP taking these medications    azithromycin 250 MG tablet Commonly known as: ZITHROMAX Stopped by: Fransisca Kaufmann Kiran Carline, MD   benzonatate 200 MG capsule Commonly known as: TESSALON Stopped by: Fransisca Kaufmann Shaylon Aden, MD   predniSONE 10 MG (21) Tbpk tablet Commonly known as: STERAPRED UNI-PAK 21 TAB Stopped by: Fransisca Kaufmann Latishia Suitt, MD       TAKE these medications    albuterol 108 (90 Base) MCG/ACT inhaler Commonly known as: VENTOLIN HFA Inhale 2 puffs into the lungs every 6 (six) hours as needed for wheezing or shortness of breath.   amoxicillin-clavulanate 875-125 MG tablet Commonly known as: AUGMENTIN Take 1 tablet by mouth 2 (two) times daily. Started by: Fransisca Kaufmann Wyndell Cardiff, MD   ascorbic acid 500 MG tablet Commonly known as: VITAMIN C Take 500 mg by mouth daily.   baclofen 10 MG tablet  Commonly known as: LIORESAL TAKE 1/2 TO 1 TABLET BY  MOUTH 3 TIMES DAILY AS  NEEDED FOR MUSCLE SPASMS What changed: See the new instructions.   buPROPion ER 100 MG 12 hr tablet Commonly known as: Wellbutrin SR Take 1 tablet (100 mg total) by mouth in the morning.   Calcium Carbonate-Vitamin D 600-400 MG-UNIT tablet Take 1 tablet by mouth 2 (two) times daily.   cycloSPORINE 0.05 % ophthalmic emulsion Commonly known as: RESTASIS Place 1 drop into both eyes 2 (two) times daily.   DULoxetine 60 MG capsule Commonly known as: Cymbalta Take 1 capsule (60 mg total) by mouth daily.   fluocinonide gel 0.05 % Commonly known as: LIDEX Apply to affected areas as  directed   fluticasone 50 MCG/ACT nasal spray Commonly known as: FLONASE Place 2 sprays into both nostrils at bedtime.   gabapentin 400 MG capsule Commonly known as: NEURONTIN TAKE 2 CAPSULES BY MOUTH 3 TIMES DAILY   iVIZIA Dry Eyes 0.5 % Soln Generic drug: Povidone (PF) Place 1 drop into both eyes every evening.   loratadine 10 MG tablet Commonly known as: CLARITIN Take 10 mg by mouth daily.   multivitamin tablet Take 1 tablet by mouth daily.   omeprazole 40 MG capsule Commonly known as: PRILOSEC TAKE 1 CAPSULE BY MOUTH TWICE  DAILY   oxybutynin 5 MG tablet Commonly known as: DITROPAN TAKE 1 TABLET BY MOUTH TWICE  DAILY   Oxycodone HCl 10 MG Tabs Take 1 tablet (10 mg total) by mouth every 6 (six) hours as needed for severe pain (not responding to chronic percocet).   oxyCODONE-acetaminophen 7.5-325 MG tablet Commonly known as: PERCOCET Take 1 tablet by mouth 2 (two) times daily as needed for severe pain.   oxyCODONE-acetaminophen 7.5-325 MG tablet Commonly known as: PERCOCET Take 1 tablet by mouth 2 (two) times daily as needed for severe pain.   oxyCODONE-acetaminophen 7.5-325 MG tablet Commonly known as: PERCOCET Take 1 tablet by mouth 2 (two) times daily as needed for severe pain.   promethazine-dextromethorphan 6.25-15 MG/5ML syrup Commonly known as: PROMETHAZINE-DM Take 5 mLs by mouth 4 (four) times daily as needed for cough. Started by: Worthy Rancher, MD   traZODone 100 MG tablet Commonly known as: DESYREL TAKE 1 TABLET BY MOUTH AT  BEDTIME AS NEEDED FOR SLEEP  /INSOMNIA   Vitamin D3 50 MCG (2000 UT) Tabs Take 2,000 Units by mouth 2 (two) times daily.   zinc gluconate 50 MG tablet Take 50 mg by mouth daily.         Objective:   BP (!) 140/74   Pulse 67   Temp 97.9 F (36.6 C)   Ht '5\' 1"'$  (1.549 m)   Wt 217 lb (98.4 kg)   SpO2 100%   BMI 41.00 kg/m   Wt Readings from Last 3 Encounters:  09/19/22 217 lb (98.4 kg)  09/11/22 212 lb  (96.2 kg)  06/15/22 205 lb 6.4 oz (93.2 kg)    Physical Exam Vitals and nursing note reviewed.  Constitutional:      General: She is not in acute distress.    Appearance: She is well-developed. She is not diaphoretic.  HENT:     Right Ear: Tympanic membrane and ear canal normal.     Left Ear: Tympanic membrane and ear canal normal.     Mouth/Throat:     Mouth: Mucous membranes are moist.     Pharynx: Oropharynx is clear. No oropharyngeal exudate or posterior oropharyngeal erythema.  Eyes:  Conjunctiva/sclera: Conjunctivae normal.  Cardiovascular:     Rate and Rhythm: Normal rate and regular rhythm.     Heart sounds: Normal heart sounds. No murmur heard. Pulmonary:     Effort: Pulmonary effort is normal. No respiratory distress.     Breath sounds: Normal breath sounds. No wheezing, rhonchi or rales.  Chest:     Chest wall: No tenderness.  Musculoskeletal:        General: No swelling or tenderness. Normal range of motion.  Skin:    General: Skin is warm and dry.     Findings: No rash.  Neurological:     Mental Status: She is alert and oriented to person, place, and time.     Coordination: Coordination normal.  Psychiatric:        Behavior: Behavior normal.       Assessment & Plan:   Problem List Items Addressed This Visit   None Visit Diagnoses     Acute bacterial bronchitis    -  Primary   Relevant Medications   promethazine-dextromethorphan (PROMETHAZINE-DM) 6.25-15 MG/5ML syrup   amoxicillin-clavulanate (AUGMENTIN) 875-125 MG tablet   fluticasone (FLONASE) 50 MCG/ACT nasal spray   Cough       Relevant Medications   fluticasone (FLONASE) 50 MCG/ACT nasal spray       Will try amoxicillin because of resistance pattern and azithromycin in the area that may have been why it did not work and also gave her a cough syrup. Follow up plan: Return if symptoms worsen or fail to improve.  Counseling provided for all of the vaccine components No orders of the  defined types were placed in this encounter.   Caryl Pina, MD [facility] 09/19/2022, 9:46 AM    [email or web address]

## 2022-09-25 ENCOUNTER — Telehealth: Payer: Self-pay | Admitting: Family Medicine

## 2022-09-25 DIAGNOSIS — B3731 Acute candidiasis of vulva and vagina: Secondary | ICD-10-CM

## 2022-09-25 MED ORDER — FLUCONAZOLE 150 MG PO TABS: 150.0000 mg | ORAL_TABLET | Freq: Once | ORAL | 0 refills | Status: AC

## 2022-09-25 NOTE — Telephone Encounter (Signed)
  Incoming Patient Call  09/25/2022  What symptoms do you have? Yeast infection, itching   How long have you been sick? Since yesterday or the day before   Have you been seen for this problem? Pt was seen by Dr. Keturah Barre 10/09/22 started her on 10 day amoxicillin and then on the weekend she started itching forgot to tell him she needs something for yeast inf   If your provider decides to give you a prescription, which pharmacy would you like for it to be sent to? [facility]    Patient informed that this information will be sent to the clinical staff for review and that they should receive a follow up call.

## 2022-09-25 NOTE — Telephone Encounter (Signed)
Patient aware medication sent to pharmacy...

## 2022-10-01 ENCOUNTER — Ambulatory Visit: Payer: Medicare Other | Admitting: Family Medicine

## 2022-10-02 ENCOUNTER — Encounter: Payer: Self-pay | Admitting: Family Medicine

## 2022-10-02 ENCOUNTER — Ambulatory Visit (INDEPENDENT_AMBULATORY_CARE_PROVIDER_SITE_OTHER): Payer: Medicare Other | Admitting: Family Medicine

## 2022-10-02 VITALS — BP 108/56 | HR 56 | Temp 97.3°F | Ht 61.0 in | Wt 216.1 lb

## 2022-10-02 DIAGNOSIS — M25561 Pain in right knee: Secondary | ICD-10-CM

## 2022-10-02 DIAGNOSIS — R5382 Chronic fatigue, unspecified: Secondary | ICD-10-CM

## 2022-10-02 DIAGNOSIS — R6889 Other general symptoms and signs: Secondary | ICD-10-CM | POA: Diagnosis not present

## 2022-10-02 DIAGNOSIS — G8929 Other chronic pain: Secondary | ICD-10-CM

## 2022-10-02 DIAGNOSIS — Z79899 Other long term (current) drug therapy: Secondary | ICD-10-CM | POA: Diagnosis not present

## 2022-10-02 DIAGNOSIS — M549 Dorsalgia, unspecified: Secondary | ICD-10-CM | POA: Diagnosis not present

## 2022-10-02 DIAGNOSIS — M5137 Other intervertebral disc degeneration, lumbosacral region: Secondary | ICD-10-CM | POA: Diagnosis not present

## 2022-10-02 DIAGNOSIS — M25562 Pain in left knee: Secondary | ICD-10-CM | POA: Diagnosis not present

## 2022-10-02 DIAGNOSIS — F339 Major depressive disorder, recurrent, unspecified: Secondary | ICD-10-CM

## 2022-10-02 DIAGNOSIS — F1190 Opioid use, unspecified, uncomplicated: Secondary | ICD-10-CM

## 2022-10-02 DIAGNOSIS — R739 Hyperglycemia, unspecified: Secondary | ICD-10-CM | POA: Diagnosis not present

## 2022-10-02 LAB — BAYER DCA HB A1C WAIVED: HB A1C (BAYER DCA - WAIVED): 6.2 % — ABNORMAL HIGH (ref 4.8–5.6)

## 2022-10-02 MED ORDER — OXYCODONE-ACETAMINOPHEN 7.5-325 MG PO TABS: 1.0000 | ORAL_TABLET | Freq: Two times a day (BID) | ORAL | 0 refills | Status: DC | PRN

## 2022-10-02 MED ORDER — OXYCODONE-ACETAMINOPHEN 7.5-325 MG PO TABS: 1.0000 | ORAL_TABLET | Freq: Two times a day (BID) | ORAL | 0 refills | Status: AC | PRN

## 2022-10-02 MED ORDER — GABAPENTIN 400 MG PO CAPS: 800.0000 mg | ORAL_CAPSULE | Freq: Three times a day (TID) | ORAL | 3 refills | Status: DC

## 2022-10-02 MED ORDER — DULOXETINE HCL 40 MG PO CPEP: 40.0000 mg | ORAL_CAPSULE | Freq: Every day | ORAL | 3 refills | Status: DC

## 2022-10-02 MED ORDER — BUPROPION HCL ER (SR) 100 MG PO TB12: 100.0000 mg | ORAL_TABLET | Freq: Every morning | ORAL | 3 refills | Status: DC

## 2022-10-02 NOTE — Progress Notes (Unsigned)
Subjective: [id number] pain PCP: Janora Norlander, DO [id number] Valerie Baker is a 67 y.o. female who is accompanied today's visit by her spouse.  She is presenting to clinic today for:  1.  Chronic pain follow-up Patient with known degenerative changes in the lumbar spine.  She will be seeing her back specialist for follow-up on CT myelogram given ongoing upper back pain that is radiating to shoulders.  She is currently treated with Percocet twice daily and takes this fairly regularly.  Denies any constipation, falls, dizziness, mentation changes or respiratory depression.  Also on muscle relaxer, gabapentin and Cymbalta.  Though she does report the Cymbalta seems to blunt her affect some so she would like to try reducing that if possible.  2.  Fatigue Patient reports fatigue.  She does not feel like it is medication induced as it has been a chronic issue and occurs outside of medication usage.  Denies any bleeding episodes.   ROS: Per HPI  Allergies  Allergen Reactions   Latex Itching and Rash   Sulfonamide Derivatives Rash   Past Medical History:  Diagnosis Date   Adenomatous colon polyp 06/13/2006   Allergy    SINUS   Anxiety    Anxiety and depression    Arthritis    Asthma    Bladder incontinence    Blood transfusion    Cataract    Depression    Fibromyalgia    GERD (gastroesophageal reflux disease)    History of kidney stones    Hyperlipidemia    Kidney stones    Obesity    Osteopenia    Post-operative nausea and vomiting     Current Outpatient Medications:    albuterol (PROVENTIL HFA;VENTOLIN HFA) 108 (90 Base) MCG/ACT inhaler, Inhale 2 puffs into the lungs every 6 (six) hours as needed for wheezing or shortness of breath., Disp: 1 Inhaler, Rfl: 0   ascorbic acid (VITAMIN C) 500 MG tablet, Take 500 mg by mouth daily., Disp: , Rfl:    baclofen (LIORESAL) 10 MG tablet, TAKE 1/2 TO 1 TABLET BY  MOUTH 3 TIMES DAILY AS  NEEDED FOR MUSCLE SPASMS (Patient taking  differently: Take 10 mg by mouth 3 (three) times daily.), Disp: 270 tablet, Rfl: 3   Calcium Carbonate-Vitamin D 600-400 MG-UNIT tablet, Take 1 tablet by mouth 2 (two) times daily., Disp: , Rfl:    Cholecalciferol (VITAMIN D3) 2000 UNITS TABS, Take 2,000 Units by mouth 2 (two) times daily., Disp: , Rfl:    cycloSPORINE (RESTASIS) 0.05 % ophthalmic emulsion, Place 1 drop into both eyes 2 (two) times daily., Disp: , Rfl:    fluticasone (FLONASE) 50 MCG/ACT nasal spray, Place 2 sprays into both nostrils at bedtime., Disp: 16 g, Rfl: 2   loratadine (CLARITIN) 10 MG tablet, Take 10 mg by mouth daily., Disp: , Rfl:    Multiple Vitamin (MULTIVITAMIN) tablet, Take 1 tablet by mouth daily., Disp: , Rfl:    omeprazole (PRILOSEC) 40 MG capsule, TAKE 1 CAPSULE BY MOUTH TWICE  DAILY, Disp: 200 capsule, Rfl: 0   oxybutynin (DITROPAN) 5 MG tablet, TAKE 1 TABLET BY MOUTH TWICE  DAILY, Disp: 200 tablet, Rfl: 0   Povidone, PF, (IVIZIA DRY EYES) 0.5 % SOLN, Place 1 drop into both eyes every evening., Disp: , Rfl:    traZODone (DESYREL) 100 MG tablet, TAKE 1 TABLET BY MOUTH AT  BEDTIME AS NEEDED FOR SLEEP  /INSOMNIA, Disp: 100 tablet, Rfl: 3   zinc gluconate 50 MG tablet, Take 50 mg  by mouth daily., Disp: , Rfl:    buPROPion ER (WELLBUTRIN SR) 100 MG 12 hr tablet, Take 1 tablet (100 mg total) by mouth in the morning., Disp: 100 tablet, Rfl: 3   DULoxetine 40 MG CPEP, Take 1 capsule (40 mg total) by mouth daily. **dose reduction, Disp: 90 capsule, Rfl: 3   gabapentin (NEURONTIN) 400 MG capsule, Take 2 capsules (800 mg total) by mouth 3 (three) times daily., Disp: 600 capsule, Rfl: 3   [START ON 11/30/2022] oxyCODONE-acetaminophen (PERCOCET) 7.5-325 MG tablet, Take 1 tablet by mouth 2 (two) times daily as needed for severe pain., Disp: 60 tablet, Rfl: 0   oxyCODONE-acetaminophen (PERCOCET) 7.5-325 MG tablet, Take 1 tablet by mouth 2 (two) times daily as needed for severe pain., Disp: 60 tablet, Rfl: 0   [START ON  10/31/2022] oxyCODONE-acetaminophen (PERCOCET) 7.5-325 MG tablet, Take 1 tablet by mouth 2 (two) times daily as needed for severe pain., Disp: 60 tablet, Rfl: 0 Social History   Socioeconomic History   Marital status: Married    Spouse name: Not on file   Number of children: 3   Years of education: Not on file   Highest education level: Not on file  Occupational History   Occupation: Disabled  Tobacco Use   Smoking status: Former    Packs/day: 2.00    Years: 5.00    Total pack years: 10.00    Types: Cigarettes    Quit date: 11/02/2005    Years since quitting: 16.9   Smokeless tobacco: Never  Vaping Use   Vaping Use: Never used  Substance and Sexual Activity   Alcohol use: No   Drug use: No   Sexual activity: Yes  Other Topics Concern   Not on file  Social History Narrative   Not on file   Social Determinants of Health   Financial Resource Strain: Low Risk  (07/17/2017)   Overall Financial Resource Strain (CARDIA)    Difficulty of Paying Living Expenses: Not hard at all  Food Insecurity: No Food Insecurity (05/07/2022)   Hunger Vital Sign    Worried About Running Out of Food in the Last Year: Never true    Ran Out of Food in the Last Year: Never true  Transportation Needs: No Transportation Needs (05/07/2022)   PRAPARE - Hydrologist (Medical): No    Lack of Transportation (Non-Medical): No  Physical Activity: Unknown (07/17/2017)   Exercise Vital Sign    Days of Exercise per Week: 0 days    Minutes of Exercise per Session: Not on file  Stress: No Stress Concern Present (07/17/2017)   [place]    Feeling of Stress : Not at all  Social Connections: [place] (07/17/2017)   Social Connection and Isolation Panel [NHANES]    Frequency of Communication with Friends and Family: More than three times a week    Frequency of Social Gatherings with Friends and Family: More than  three times a week    Attends Religious Services: More than 4 times per year    Active Member of [employer] or Organizations: Yes    Attends Archivist Meetings: More than 4 times per year    Marital Status: Married  Human resources officer Violence: Not At Risk (05/07/2022)   Humiliation, Afraid, Rape, and Kick questionnaire    Fear of Current or Ex-Partner: No    Emotionally Abused: No    Physically Abused: No    Sexually  Abused: No   Family History  Problem Relation Age of Onset   Heart disease Mother    Diabetes Mother    Melanoma Mother        nose   Stroke Father    Colon polyps Sister    Hypertension Sister    Skin cancer Sister    Diabetes Brother    Cirrhosis Brother    Hypertension Brother    Colon polyps Brother    Hypertension Brother    Stomach cancer Other    Rectal cancer Other    Esophageal cancer Other    Colon cancer Other     Objective: Office vital signs reviewed. BP (!) 108/56   Pulse (!) 56   Temp (!) 97.3 F (36.3 C) (Temporal)   Ht 5' 1"$  (1.549 m)   Wt 216 lb 2 oz (98 kg)   SpO2 98%   BMI 40.84 kg/m   Physical Examination:  General: Awake, alert, morbidly obese, No acute distress HEENT: sclera white, MMM Cardio: regular rate and rhythm, S1S2 heard, no murmurs appreciated Pulm: clear to auscultation bilaterally, no wheezes, rhonchi or rales; normal work of breathing on room air MSK: Mildly antalgic, wide-based gait with normal station. Psych: Mood stable, speech normal.  Very pleasant and interactive     10/02/2022    9:37 AM 09/19/2022    9:08 AM 09/11/2022   11:27 AM  Depression screen PHQ 2/9  Decreased Interest 0 0 0  Down, Depressed, Hopeless 0 0 0  PHQ - 2 Score 0 0 0  Altered sleeping 0 0 0  Tired, decreased energy 0 0 0  Change in appetite 0 0 0  Feeling bad or failure about yourself  0 0 0  Trouble concentrating 0 0 0  Moving slowly or fidgety/restless 0 0 0  Suicidal thoughts 0 0 0  PHQ-9 Score 0 0 0  Difficult doing  work/chores Not difficult at all Not difficult at all Not difficult at all      10/02/2022    9:38 AM 06/15/2022    8:26 AM 03/14/2022   10:09 AM 12/25/2021   10:42 AM  GAD 7 : Generalized Anxiety Score  Nervous, Anxious, on Edge 0 0 0 0  Control/stop worrying 0 0 0 0  Worry too much - different things 0 0 0 0  Trouble relaxing 0 0 0 0  Restless 0 0 0 0  Easily annoyed or irritable 0 0 0 0  Afraid - awful might happen 0 0 0 0  Total GAD 7 Score 0 0 0 0  Anxiety Difficulty Not difficult at all Not difficult at all Not difficult at all Not difficult at all     Assessment/ Plan: 67 y.o. female   Bilateral chronic knee pain - Plan: oxyCODONE-acetaminophen (PERCOCET) 7.5-325 MG tablet, oxyCODONE-acetaminophen (PERCOCET) 7.5-325 MG tablet, ToxASSURE Select 13 (MW), Urine  Upper back pain  Disc degeneration, lumbosacral - Plan: ToxASSURE Select 13 (MW), Urine, gabapentin (NEURONTIN) 400 MG capsule  Chronic, continuous use of opioids - Plan: oxyCODONE-acetaminophen (PERCOCET) 7.5-325 MG tablet, oxyCODONE-acetaminophen (PERCOCET) 7.5-325 MG tablet, ToxASSURE Select 13 (MW), Urine, VITAMIN D 25 Hydroxy (Vit-D Deficiency, Fractures)  Controlled substance agreement signed - Plan: ToxASSURE Select 13 (MW), Urine  Depression, recurrent (HCC) - Plan: DULoxetine 40 MG CPEP, buPROPion ER (WELLBUTRIN SR) 100 MG 12 hr tablet, Vitamin B12, VITAMIN D 25 Hydroxy (Vit-D Deficiency, Fractures)  Chronic fatigue - Plan: TSH, T4, Free, CMP14+EGFR, Bayer DCA Hb A1c Waived, Vitamin B12, CBC,  VITAMIN D 25 Hydroxy (Vit-D Deficiency, Fractures)  Chronic pain slightly exacerbated by upper back pain.  Has follow-up with orthopedics for further evaluation and management.  For now, continue current regimen.  Medications have been renewed.  UDS and CSC were updated as per office policy.  National narcotic database reviewed and there were no red flags.  We will reduce her Cymbalta to 40 mg daily.  However, we discussed  this may impact her overall chronic pain.  Hopefully this will allow for less affect flattening  With regards to the fatigue that she experiences, I do suspect it is a combination of chronic pain and mood issues.  Check labs however to further evaluate for metabolic etiology  Orders Placed This Encounter  Procedures   ToxASSURE Select 13 (MW), Urine    Current Outpatient Medications:    albuterol (PROVENTIL HFA;VENTOLIN HFA) 108 (90 Base) MCG/ACT inhaler, Inhale 2 puffs into the lungs every 6 (six) hours as needed for wheezing or shortness of breath., Disp: 1 Inhaler, Rfl: 0   amoxicillin-clavulanate (AUGMENTIN) 875-125 MG tablet, Take 1 tablet by mouth 2 (two) times daily., Disp: 20 tablet, Rfl: 0   ascorbic acid (VITAMIN C) 500 MG tablet, Take 500 mg by mouth daily., Disp: , Rfl:    baclofen (LIORESAL) 10 MG tablet, TAKE 1/2 TO 1 TABLET BY  MOUTH 3 TIMES DAILY AS  NEEDED FOR MUSCLE SPASMS (Patient taking differently: Take 10 mg by mouth 3 (three) times daily.), Disp: 270 tablet, Rfl: 3   buPROPion ER (WELLBUTRIN SR) 100 MG 12 hr tablet, Take 1 tablet (100 mg total) by mouth in the morning., Disp: 90 tablet, Rfl: 3   Calcium Carbonate-Vitamin D 600-400 MG-UNIT tablet, Take 1 tablet by mouth 2 (two) times daily., Disp: , Rfl:    Cholecalciferol (VITAMIN D3) 2000 UNITS TABS, Take 2,000 Units by mouth 2 (two) times daily., Disp: , Rfl:    cycloSPORINE (RESTASIS) 0.05 % ophthalmic emulsion, Place 1 drop into both eyes 2 (two) times daily., Disp: , Rfl:    DULoxetine (CYMBALTA) 60 MG capsule, Take 1 capsule (60 mg total) by mouth daily., Disp: 90 capsule, Rfl: 3   fluocinonide gel (LIDEX) 0.05 %, Apply to affected areas as directed, Disp: 60 g, Rfl: 1   fluticasone (FLONASE) 50 MCG/ACT nasal spray, Place 2 sprays into both nostrils at bedtime., Disp: 16 g, Rfl: 2   gabapentin (NEURONTIN) 400 MG capsule, TAKE 2 CAPSULES BY MOUTH 3 TIMES DAILY, Disp: 600 capsule, Rfl: 0   loratadine  (CLARITIN) 10 MG tablet, Take 10 mg by mouth daily., Disp: , Rfl:    Multiple Vitamin (MULTIVITAMIN) tablet, Take 1 tablet by mouth daily., Disp: , Rfl:    omeprazole (PRILOSEC) 40 MG capsule, TAKE 1 CAPSULE BY MOUTH TWICE  DAILY, Disp: 200 capsule, Rfl: 0   oxybutynin (DITROPAN) 5 MG tablet, TAKE 1 TABLET BY MOUTH TWICE  DAILY, Disp: 200 tablet, Rfl: 0   oxyCODONE 10 MG TABS, Take 1 tablet (10 mg total) by mouth every 6 (six) hours as needed for severe pain (not responding to chronic percocet)., Disp: 42 tablet, Rfl: 0   oxyCODONE-acetaminophen (PERCOCET) 7.5-325 MG tablet, Take 1 tablet by mouth 2 (two) times daily as needed for severe pain., Disp: 60 tablet, Rfl: 0   oxyCODONE-acetaminophen (PERCOCET) 7.5-325 MG tablet, Take 1 tablet by mouth 2 (two) times daily as needed for severe pain., Disp: 60 tablet, Rfl: 0   oxyCODONE-acetaminophen (PERCOCET) 7.5-325 MG tablet, Take 1 tablet by mouth 2 (  two) times daily as needed for severe pain., Disp: 60 tablet, Rfl: 0   Povidone, PF, (IVIZIA DRY EYES) 0.5 % SOLN, Place 1 drop into both eyes every evening., Disp: , Rfl:    promethazine-dextromethorphan (PROMETHAZINE-DM) 6.25-15 MG/5ML syrup, Take 5 mLs by mouth 4 (four) times daily as needed for cough., Disp: 118 mL, Rfl: 0   traZODone (DESYREL) 100 MG tablet, TAKE 1 TABLET BY MOUTH AT  BEDTIME AS NEEDED FOR SLEEP  /INSOMNIA, Disp: 100 tablet, Rfl: 3   zinc gluconate 50 MG tablet, Take 50 mg by mouth daily., Disp: , Rfl:   TSH   T4, Free   CMP14+EGFR   Bayer DCA Hb A1c Waived   Vitamin B12   CBC   VITAMIN D 25 Hydroxy (Vit-D Deficiency, Fractures)   Meds ordered this encounter  Medications   oxyCODONE-acetaminophen (PERCOCET) 7.5-325 MG tablet    Sig: Take 1 tablet by mouth 2 (two) times daily as needed for severe pain.    Dispense:  60 tablet    Refill:  0   oxyCODONE-acetaminophen (PERCOCET) 7.5-325 MG tablet    Sig: Take 1 tablet by mouth 2 (two) times daily as needed for severe  pain.    Dispense:  60 tablet    Refill:  0   oxyCODONE-acetaminophen (PERCOCET) 7.5-325 MG tablet    Sig: Take 1 tablet by mouth 2 (two) times daily as needed for severe pain.    Dispense:  60 tablet    Refill:  0   DULoxetine 40 MG CPEP    Sig: Take 1 capsule (40 mg total) by mouth daily. **dose reduction    Dispense:  90 capsule    Refill:  3   buPROPion ER (WELLBUTRIN SR) 100 MG 12 hr tablet    Sig: Take 1 tablet (100 mg total) by mouth in the morning.    Dispense:  100 tablet    Refill:  3   gabapentin (NEURONTIN) 400 MG capsule    Sig: Take 2 capsules (800 mg total) by mouth 3 (three) times daily.    Dispense:  600 capsule    Refill:  3    Please send a replace/new response with 100-Day Supply if appropriate to maximize member benefit.     Janora Norlander, DO [place] [phone]

## 2022-10-03 ENCOUNTER — Telehealth: Payer: Self-pay | Admitting: *Deleted

## 2022-10-03 DIAGNOSIS — M4325 Fusion of spine, thoracolumbar region: Secondary | ICD-10-CM | POA: Diagnosis not present

## 2022-10-03 DIAGNOSIS — M7062 Trochanteric bursitis, left hip: Secondary | ICD-10-CM | POA: Diagnosis not present

## 2022-10-03 DIAGNOSIS — M5412 Radiculopathy, cervical region: Secondary | ICD-10-CM | POA: Diagnosis not present

## 2022-10-03 LAB — VITAMIN B12: Vitamin B-12: 823 pg/mL (ref 232–1245)

## 2022-10-03 LAB — CMP14+EGFR
ALT: 28 IU/L (ref 0–32)
AST: 30 IU/L (ref 0–40)
Albumin/Globulin Ratio: 2.2 (ref 1.2–2.2)
Albumin: 4 g/dL (ref 3.9–4.9)
Alkaline Phosphatase: 111 IU/L (ref 44–121)
BUN/Creatinine Ratio: 24 (ref 12–28)
BUN: 18 mg/dL (ref 8–27)
Bilirubin Total: 0.5 mg/dL (ref 0.0–1.2)
CO2: 24 mmol/L (ref 20–29)
Calcium: 9.2 mg/dL (ref 8.7–10.3)
Chloride: 104 mmol/L (ref 96–106)
Creatinine, Ser: 0.75 mg/dL (ref 0.57–1.00)
Globulin, Total: 1.8 g/dL (ref 1.5–4.5)
Glucose: 77 mg/dL (ref 70–99)
Potassium: 4.6 mmol/L (ref 3.5–5.2)
Sodium: 141 mmol/L (ref 134–144)
Total Protein: 5.8 g/dL — ABNORMAL LOW (ref 6.0–8.5)
eGFR: 88 mL/min/{1.73_m2} (ref >59)

## 2022-10-03 LAB — CBC
Hematocrit: 31.4 % — ABNORMAL LOW (ref 34.0–46.6)
Hemoglobin: 10.6 g/dL — ABNORMAL LOW (ref 11.1–15.9)
MCH: 30.8 pg (ref 26.6–33.0)
MCHC: 33.8 g/dL (ref 31.5–35.7)
MCV: 91 fL (ref 79–97)
Platelets: 202 10*3/uL (ref 150–450)
RBC: 3.44 x10E6/uL — ABNORMAL LOW (ref 3.77–5.28)
RDW: 12.9 % (ref 11.7–15.4)
WBC: 4.5 10*3/uL (ref 3.4–10.8)

## 2022-10-03 LAB — T4, FREE: Free T4: 1.17 ng/dL (ref 0.82–1.77)

## 2022-10-03 LAB — VITAMIN D 25 HYDROXY (VIT D DEFICIENCY, FRACTURES): Vit D, 25-Hydroxy: 74.3 ng/mL (ref 30.0–100.0)

## 2022-10-03 LAB — TSH: TSH: 3.45 u[IU]/mL (ref 0.450–4.500)

## 2022-10-03 MED ORDER — DULOXETINE HCL 30 MG PO CPEP: 30.0000 mg | ORAL_CAPSULE | Freq: Every day | ORAL | 3 refills | Status: DC

## 2022-10-03 NOTE — Telephone Encounter (Signed)
Fax from [facility] RE: Duloxetine EC/DR 40 mg is not covered by pt's insurance Request to change to covered alternative: Duloxetine cap 60 mg Venlafaxine cap 75 mg ER Desvenlafax tab 50 mg ER Or submit PA

## 2022-10-03 NOTE — Addendum Note (Signed)
Addended by: Janora Norlander on: 10/03/2022 10:27 AM   Modules accepted: Orders

## 2022-10-04 LAB — TOXASSURE SELECT 13 (MW), URINE

## 2022-10-05 ENCOUNTER — Other Ambulatory Visit: Payer: Self-pay | Admitting: Orthopedic Surgery

## 2022-10-05 DIAGNOSIS — M25511 Pain in right shoulder: Secondary | ICD-10-CM

## 2022-10-09 ENCOUNTER — Encounter: Payer: Medicare Other | Admitting: Family Medicine

## 2022-10-10 ENCOUNTER — Telehealth: Payer: Self-pay | Admitting: Family Medicine

## 2022-10-10 NOTE — Telephone Encounter (Signed)
I've messaged julie but don't think that would cause the abnormality we saw

## 2022-10-11 ENCOUNTER — Telehealth: Payer: Self-pay | Admitting: Pharmacist

## 2022-10-11 NOTE — Telephone Encounter (Signed)
Drug information request: Research provided on common medications that can flag as benzos in a UDS:

## 2022-10-22 ENCOUNTER — Telehealth: Payer: Self-pay | Admitting: Family Medicine

## 2022-10-22 NOTE — Telephone Encounter (Signed)
Patient wanted to let PCP know that she did not pass her drug test because her niece gave her a cough medication that was prescribed to her and the patient thought that it was an over the counter when she took it. The medication that she took was Brom/pse/dm

## 2022-10-23 NOTE — Telephone Encounter (Signed)
Again, I ran this by the pharmacist.  She researched this EXTENSIVELY and Valerie Baker denied taking anything that was on the list that [name] presented me with.  Because, this is a POSITIVE test and there is no reasonable explanation, it forces my hand and I will have to refer her to a specialist if she wants to continue the medication.  There was no ability to retest her sample.  Does she want a referral to [facility]?

## 2022-10-30 DIAGNOSIS — M47812 Spondylosis without myelopathy or radiculopathy, cervical region: Secondary | ICD-10-CM | POA: Diagnosis not present

## 2022-11-05 ENCOUNTER — Other Ambulatory Visit: Payer: Self-pay | Admitting: Orthopedic Surgery

## 2022-11-05 ENCOUNTER — Ambulatory Visit
Admission: RE | Admit: 2022-11-05 | Discharge: 2022-11-05 | Disposition: A | Discharge status: Home / Self Care | Payer: 59 | Source: Ambulatory Visit | Attending: Orthopedic Surgery | Admitting: Orthopedic Surgery

## 2022-11-05 ENCOUNTER — Ambulatory Visit
Admission: RE | Admit: 2022-11-05 | Discharge: 2022-11-05 | Disposition: A | Discharge status: Home / Self Care | Payer: Medicare Other | Source: Ambulatory Visit | Attending: Orthopedic Surgery | Admitting: Orthopedic Surgery

## 2022-11-05 DIAGNOSIS — M25512 Pain in left shoulder: Secondary | ICD-10-CM

## 2022-11-05 DIAGNOSIS — M19012 Primary osteoarthritis, left shoulder: Secondary | ICD-10-CM | POA: Diagnosis not present

## 2022-11-05 DIAGNOSIS — M25511 Pain in right shoulder: Secondary | ICD-10-CM | POA: Diagnosis not present

## 2022-11-20 DIAGNOSIS — M4325 Fusion of spine, thoracolumbar region: Secondary | ICD-10-CM | POA: Diagnosis not present

## 2022-12-02 ENCOUNTER — Other Ambulatory Visit: Payer: Self-pay | Admitting: Family Medicine

## 2022-12-02 DIAGNOSIS — R350 Frequency of micturition: Secondary | ICD-10-CM

## 2022-12-02 DIAGNOSIS — K219 Gastro-esophageal reflux disease without esophagitis: Secondary | ICD-10-CM

## 2022-12-21 ENCOUNTER — Ambulatory Visit: Payer: Medicare Other | Admitting: Family Medicine

## 2022-12-24 ENCOUNTER — Ambulatory Visit: Payer: Medicare Other | Admitting: Family Medicine

## 2022-12-26 DIAGNOSIS — M47812 Spondylosis without myelopathy or radiculopathy, cervical region: Secondary | ICD-10-CM | POA: Diagnosis not present

## 2022-12-31 DIAGNOSIS — L905 Scar conditions and fibrosis of skin: Secondary | ICD-10-CM | POA: Diagnosis not present

## 2022-12-31 DIAGNOSIS — L821 Other seborrheic keratosis: Secondary | ICD-10-CM | POA: Diagnosis not present

## 2022-12-31 DIAGNOSIS — Z85828 Personal history of other malignant neoplasm of skin: Secondary | ICD-10-CM | POA: Diagnosis not present

## 2022-12-31 DIAGNOSIS — D225 Melanocytic nevi of trunk: Secondary | ICD-10-CM | POA: Diagnosis not present

## 2022-12-31 DIAGNOSIS — D2372 Other benign neoplasm of skin of left lower limb, including hip: Secondary | ICD-10-CM | POA: Diagnosis not present

## 2022-12-31 DIAGNOSIS — L565 Disseminated superficial actinic porokeratosis (DSAP): Secondary | ICD-10-CM | POA: Diagnosis not present

## 2022-12-31 DIAGNOSIS — L570 Actinic keratosis: Secondary | ICD-10-CM | POA: Diagnosis not present

## 2023-01-11 ENCOUNTER — Telehealth: Payer: Self-pay

## 2023-01-11 NOTE — Telephone Encounter (Signed)
Called pt at her appt time regarding her AWV and lvm for patient to call back. Called patient back 2nd time no answer. Patient will need to reschedule her AWV.

## 2023-01-24 DIAGNOSIS — Z79891 Long term (current) use of opiate analgesic: Secondary | ICD-10-CM | POA: Diagnosis not present

## 2023-01-24 DIAGNOSIS — Z79899 Other long term (current) drug therapy: Secondary | ICD-10-CM | POA: Diagnosis not present

## 2023-01-24 DIAGNOSIS — G894 Chronic pain syndrome: Secondary | ICD-10-CM | POA: Diagnosis not present

## 2023-01-24 DIAGNOSIS — M4325 Fusion of spine, thoracolumbar region: Secondary | ICD-10-CM | POA: Diagnosis not present

## 2023-01-24 DIAGNOSIS — M791 Myalgia, unspecified site: Secondary | ICD-10-CM | POA: Diagnosis not present

## 2023-02-25 ENCOUNTER — Ambulatory Visit: Payer: Medicare Other | Admitting: Family Medicine

## 2023-02-27 ENCOUNTER — Ambulatory Visit: Payer: Medicare Other | Admitting: Family Medicine

## 2023-03-28 DIAGNOSIS — M4325 Fusion of spine, thoracolumbar region: Secondary | ICD-10-CM | POA: Diagnosis not present

## 2023-03-28 DIAGNOSIS — M542 Cervicalgia: Secondary | ICD-10-CM | POA: Diagnosis not present

## 2023-03-29 ENCOUNTER — Ambulatory Visit (INDEPENDENT_AMBULATORY_CARE_PROVIDER_SITE_OTHER): Payer: Medicare Other | Admitting: Nurse Practitioner

## 2023-03-29 ENCOUNTER — Encounter: Payer: Self-pay | Admitting: Nurse Practitioner

## 2023-03-29 ENCOUNTER — Other Ambulatory Visit: Payer: Self-pay | Admitting: Family Medicine

## 2023-03-29 DIAGNOSIS — R059 Cough, unspecified: Secondary | ICD-10-CM | POA: Diagnosis not present

## 2023-03-29 DIAGNOSIS — L438 Other lichen planus: Secondary | ICD-10-CM

## 2023-03-29 DIAGNOSIS — J40 Bronchitis, not specified as acute or chronic: Secondary | ICD-10-CM | POA: Diagnosis not present

## 2023-03-29 MED ORDER — PROMETHAZINE-DM 6.25-15 MG/5ML PO SYRP
5.0000 mL | ORAL_SOLUTION | Freq: Four times a day (QID) | ORAL | 0 refills | Status: DC | PRN
Start: 2023-03-29 — End: 2023-05-29

## 2023-03-29 MED ORDER — ALBUTEROL SULFATE HFA 108 (90 BASE) MCG/ACT IN AERS
2.0000 | INHALATION_SPRAY | Freq: Four times a day (QID) | RESPIRATORY_TRACT | 0 refills | Status: DC | PRN
Start: 2023-03-29 — End: 2023-06-14

## 2023-03-29 MED ORDER — METHYLPREDNISOLONE ACETATE 80 MG/ML IJ SUSP
80.0000 mg | Freq: Once | INTRAMUSCULAR | Status: AC
Start: 2023-03-29 — End: 2023-03-29
  Administered 2023-03-29: 80 mg via INTRAMUSCULAR

## 2023-03-29 MED ORDER — PREDNISONE 20 MG PO TABS
ORAL_TABLET | ORAL | 0 refills | Status: DC
Start: 2023-03-29 — End: 2023-05-29

## 2023-03-29 NOTE — Progress Notes (Signed)
Subjective:    Patient ID: Valerie Baker, female    DOB: 06-15-1956, 67 y.o.   MRN: [id number]   Chief Complaint: Cough and nasal congesiton   Cough This is a new problem. The current episode started 1 to 4 weeks ago. The problem has been waxing and waning. The problem occurs every few minutes. The cough is Non-productive. Pertinent negatives include no ear congestion, ear pain, fever, nasal congestion, rhinorrhea or shortness of breath. Nothing aggravates the symptoms. She has tried OTC cough suppressant for the symptoms. The treatment provided mild relief. Her past medical history is significant for bronchitis.    Patient Active Problem List   Diagnosis Date Noted   Osteoarthritis of right knee 05/07/2022   Neck pain 03/15/2022   Back pain 03/15/2022   Symptomatic mammary hypertrophy 03/15/2022   Chronic pain syndrome 12/25/2021   Pure hypercholesterolemia 01/22/2020   Dysfunction of both eustachian tubes 04/06/2019   OA (osteoarthritis) of knee 12/30/2017   Depression, recurrent (HCC) 10/27/2017   Contact dermatitis and eczema due to plant 11/29/2016   Overactive bladder 11/29/2016   Frequent urination 01/23/2016   Urgency of urination 01/23/2016   Rash 01/23/2016   Urinary frequency 04/21/2015   Stress fracture of pelvis 11/03/2014   Disc degeneration, lumbosacral 11/03/2014   Osteopenia 11/03/2014   Severe obesity (BMI >= 40) (HCC) 11/03/2014   Essential hypertension 04/04/2009   Asthma 04/04/2009   MIGRAINES, HX OF 04/04/2009       Review of Systems  Constitutional:  Negative for fever.  HENT:  Negative for ear pain and rhinorrhea.   Respiratory:  Positive for cough. Negative for shortness of breath.        Objective:   Physical Exam Constitutional:      Appearance: Normal appearance.  Cardiovascular:     Rate and Rhythm: Normal rate and regular rhythm.     Heart sounds: Normal heart sounds.  Pulmonary:     Effort: Pulmonary effort is normal.      Breath sounds: Rhonchi present.  Skin:    General: Skin is warm.  Neurological:     General: No focal deficit present.     Mental Status: She is alert and oriented to person, place, and time.  Psychiatric:        Mood and Affect: Mood normal.        Behavior: Behavior normal.     BP (!) 153/87   Pulse 62   Temp (!) 97.1 F (36.2 C) (Temporal)   Resp 20   Ht 5\' 1"  (1.549 m)   Wt 225 lb (102.1 kg)   SpO2 95%   BMI 42.51 kg/m        Assessment & Plan:  Valerie Baker in today with chief complaint of Cough and nasal congesiton   1. Bronchitis 1. Take meds as prescribed 2. Use a cool mist humidifier especially during the winter months and when heat has been humid. 3. Use saline nose sprays frequently 4. Saline irrigations of the nose can be very helpful if done frequently.  * 4X daily for 1 week*  * Use of a nettie pot can be helpful with this. Follow directions with this* 5. Drink plenty of fluids 6. Keep thermostat turn down low 7.For any cough or congestion- promethazine DM 8. For fever or aces or pains- take tylenol or ibuprofen appropriate for age and weight.  * for fevers greater than 101 orally you may alternate ibuprofen and tylenol every  3 hours.    -  methylPREDNISolone acetate (DEPO-MEDROL) injection 80 mg - predniSONE (DELTASONE) 20 MG tablet; 2 po at sametime daily for 5 days-  Dispense: 10 tablet; Refill: 0 - promethazine-dextromethorphan (PROMETHAZINE-DM) 6.25-15 MG/5ML syrup; Take 5 mLs by mouth 4 (four) times daily as needed for cough.  Dispense: 118 mL; Refill: 0 - albuterol (VENTOLIN HFA) 108 (90 Base) MCG/ACT inhaler; Inhale 2 puffs into the lungs every 6 (six) hours as needed for wheezing or shortness of breath.  Dispense: 1 each; Refill: 0  2. Cough     The above assessment and management plan was discussed with the patient. The patient verbalized understanding of and has agreed to the management plan. Patient is aware to call the clinic if  symptoms persist or worsen. Patient is aware when to return to the clinic for a follow-up visit. Patient educated on when it is appropriate to go to the emergency department.   Valerie Daphine Deutscher, FNP

## 2023-03-29 NOTE — Patient Instructions (Signed)

## 2023-04-03 ENCOUNTER — Telehealth: Payer: Self-pay | Admitting: Plastic Surgery

## 2023-04-03 NOTE — Telephone Encounter (Signed)
Patient called and said her surgery is scheduled for 06/19/23, but her insurance told her that it was approved through 06/16/23.  She wanted to know if the approval was going to be extended to cover the surgery.  Please call her at [phone]

## 2023-04-18 ENCOUNTER — Other Ambulatory Visit: Payer: Self-pay | Admitting: Family Medicine

## 2023-04-18 DIAGNOSIS — L438 Other lichen planus: Secondary | ICD-10-CM

## 2023-04-19 NOTE — Telephone Encounter (Signed)
Please review for refill.  Medication is not on current med list.  Rx states that pt uses for Lichen Planus.

## 2023-04-24 DIAGNOSIS — M47812 Spondylosis without myelopathy or radiculopathy, cervical region: Secondary | ICD-10-CM | POA: Diagnosis not present

## 2023-05-16 DIAGNOSIS — M791 Myalgia, unspecified site: Secondary | ICD-10-CM | POA: Diagnosis not present

## 2023-05-16 DIAGNOSIS — M47812 Spondylosis without myelopathy or radiculopathy, cervical region: Secondary | ICD-10-CM | POA: Diagnosis not present

## 2023-05-28 ENCOUNTER — Encounter: Payer: Self-pay | Admitting: Physician Assistant

## 2023-05-28 ENCOUNTER — Ambulatory Visit
Admission: RE | Admit: 2023-05-28 | Discharge: 2023-05-28 | Disposition: A | Discharge status: Home / Self Care | Payer: 59 | Source: Ambulatory Visit | Attending: Plastic Surgery | Admitting: Plastic Surgery

## 2023-05-28 ENCOUNTER — Other Ambulatory Visit: Payer: Self-pay | Admitting: Plastic Surgery

## 2023-05-28 ENCOUNTER — Ambulatory Visit (INDEPENDENT_AMBULATORY_CARE_PROVIDER_SITE_OTHER): Payer: Medicare Other | Admitting: Physician Assistant

## 2023-05-28 VITALS — BP 145/83 | HR 63 | Ht 64.0 in | Wt 226.0 lb

## 2023-05-28 DIAGNOSIS — N62 Hypertrophy of breast: Secondary | ICD-10-CM | POA: Diagnosis not present

## 2023-05-28 DIAGNOSIS — Z0000 Encounter for general adult medical examination without abnormal findings: Secondary | ICD-10-CM

## 2023-05-28 MED ORDER — ONDANSETRON 4 MG PO TBDP: 4.0000 mg | ORAL_TABLET | Freq: Three times a day (TID) | ORAL | 0 refills | Status: DC | PRN

## 2023-05-28 MED ORDER — CEPHALEXIN 500 MG PO CAPS: 500.0000 mg | ORAL_CAPSULE | Freq: Four times a day (QID) | ORAL | 0 refills | Status: DC

## 2023-05-28 NOTE — Progress Notes (Signed)
Patient ID: Valerie Baker, female    DOB: 19-Jun-1956, 67 y.o.   MRN: [id number]  Chief Complaint  Patient presents with   Pre-op Exam      ICD-10-CM   1. Symptomatic mammary hypertrophy  N62        History of Present Illness: Valerie Baker is a 67 y.o.  female  with a history of symptomatic mammary hyperplasia.  She presents for preoperative evaluation for upcoming procedure, bilateral breast reduction with possible liposuction, scheduled for 06/27/2023 with Dr. Ulice Bold.  The patient reports a history of PONV with previous anesthesia, but relatively well-controlled with scopolamine patches and antiemetics.  She is accompanied by her sister at bedside who will be assisting with her postoperative recovery.  She weighed 208 pounds at her initial consult last year, now weighs 226 pounds.  She denies use of cigarettes or other nicotine-containing products.  She understands the urgent necessity for mammogram completion prior to surgery.  She does endorse a family history of DVT in her mother who had varicosities.  She to endorses varicosities.  She denies any significant cardiac or pulmonary disease, use of anticoagulation, or personal history of cancer.  She does have mild asthma, but well-controlled with the occasional use of albuterol.  She confirms that she is a D cup and would like to be a B cup postoperatively.  She tells me that she is primarily concerned about the lateral aspects of her breasts and does not want move forward with breast reduction surgery unless those are addressed.  She then discussed this with Dr. Ulice Bold here during her encounter.  She also tells me that she would prefer to stay overnight for observation rather than go home on day of surgery and states this was something that she had discussed at length with Dr. Ulice Bold at her consult.  Her chronic pain is managed by Dr. Sharolyn Douglas.  She understands that I will need clearance from his office before  prescribing additional narcotics, particularly in the setting of her regular use of multiple sedative medications including trazodone, gabapentin, and baclofen in addition to oxycodone.  Will hold vitamins and supplements one week prior to surgery.  NEEDS MAMMOGRAM.  Summary of Previous Visit: She was seen for consult by Dr. Ulice Bold 03/2022.  At that time, complained of chronic upper back and neck discomfort in the context of large breasts.  BMI at that time equal 39.3 kg/m.  Preoperative bra size equals E cup.  STN 38 cm each side.  Estimated excess breast tissue removed at time of surgery equals 600 to 650 g each side.  Mammogram 11/2021 was negative.  Discussed surgical intervention via breast reduction patient was agreeable.  Job: Disability.  PMH Significant for: Symptomatic mammary hypertrophy, asthma, obesity, degenerative disc disease, chronic pain on oxycodone 7.5-325 3 times daily as well as gabapentin.   Past Medical History: Allergies: Allergies  Allergen Reactions   Latex Itching and Rash   Sulfonamide Derivatives Rash    Current Medications:  Current Outpatient Medications:    albuterol (VENTOLIN HFA) 108 (90 Base) MCG/ACT inhaler, Inhale 2 puffs into the lungs every 6 (six) hours as needed for wheezing or shortness of breath., Disp: 1 each, Rfl: 0   ascorbic acid (VITAMIN C) 500 MG tablet, Take 500 mg by mouth daily., Disp: , Rfl:    baclofen (LIORESAL) 10 MG tablet, TAKE 1/2 TO 1 TABLET BY  MOUTH 3 TIMES DAILY AS  NEEDED FOR MUSCLE SPASMS (Patient taking differently:  Take 10 mg by mouth 3 (three) times daily.), Disp: 270 tablet, Rfl: 3   buPROPion ER (WELLBUTRIN SR) 100 MG 12 hr tablet, Take 1 tablet (100 mg total) by mouth in the morning., Disp: 100 tablet, Rfl: 3   Calcium Carbonate-Vitamin D 600-400 MG-UNIT tablet, Take 1 tablet by mouth 2 (two) times daily., Disp: , Rfl:    Cholecalciferol (VITAMIN D3) 2000 UNITS TABS, Take 2,000 Units by mouth 2 (two) times daily.,  Disp: , Rfl:    cycloSPORINE (RESTASIS) 0.05 % ophthalmic emulsion, Place 1 drop into both eyes 2 (two) times daily., Disp: , Rfl:    DULoxetine (CYMBALTA) 30 MG capsule, Take 1 capsule (30 mg total) by mouth daily., Disp: 90 capsule, Rfl: 3   fluocinonide gel (LIDEX) 0.05 %, Apply 0.5gm to affected areas as directed, Disp: 60 g, Rfl: 1   fluticasone (FLONASE) 50 MCG/ACT nasal spray, Place 2 sprays into both nostrils at bedtime., Disp: 16 g, Rfl: 2   gabapentin (NEURONTIN) 400 MG capsule, Take 2 capsules (800 mg total) by mouth 3 (three) times daily., Disp: 600 capsule, Rfl: 3   loratadine (CLARITIN) 10 MG tablet, Take 10 mg by mouth daily., Disp: , Rfl:    meloxicam (MOBIC) 15 MG tablet, Take 15 mg by mouth daily., Disp: , Rfl:    Multiple Vitamin (MULTIVITAMIN) tablet, Take 1 tablet by mouth daily., Disp: , Rfl:    omeprazole (PRILOSEC) 40 MG capsule, TAKE 1 CAPSULE BY MOUTH TWICE  DAILY, Disp: 200 capsule, Rfl: 0   OVER THE COUNTER MEDICATION, Fish Oil-Take 2 capsule by mouth daily., Disp: , Rfl:    OVER THE COUNTER MEDICATION, Zinc 50mg -Take 1 capsule by mouth daily., Disp: , Rfl:    oxybutynin (DITROPAN) 5 MG tablet, TAKE 1 TABLET BY MOUTH TWICE  DAILY, Disp: 200 tablet, Rfl: 0   oxyCODONE-acetaminophen (PERCOCET) 7.5-325 MG tablet, Take 1 tablet by mouth 2 (two) times daily as needed for severe pain., Disp: 60 tablet, Rfl: 0   oxyCODONE-acetaminophen (PERCOCET) 7.5-325 MG tablet, Take 1 tablet by mouth 2 (two) times daily as needed for severe pain., Disp: 60 tablet, Rfl: 0   oxyCODONE-acetaminophen (PERCOCET) 7.5-325 MG tablet, Take 1 tablet by mouth 2 (two) times daily as needed for severe pain., Disp: 60 tablet, Rfl: 0   Povidone, PF, (IVIZIA DRY EYES) 0.5 % SOLN, Place 1 drop into both eyes every evening., Disp: , Rfl:    predniSONE (DELTASONE) 20 MG tablet, 2 po at sametime daily for 5 days-, Disp: 10 tablet, Rfl: 0   promethazine-dextromethorphan (PROMETHAZINE-DM) 6.25-15 MG/5ML syrup,  Take 5 mLs by mouth 4 (four) times daily as needed for cough., Disp: 118 mL, Rfl: 0   traZODone (DESYREL) 100 MG tablet, TAKE 1 TABLET BY MOUTH AT  BEDTIME AS NEEDED FOR SLEEP  /INSOMNIA, Disp: 100 tablet, Rfl: 3   zinc gluconate 50 MG tablet, Take 50 mg by mouth daily., Disp: , Rfl:   Past Medical Problems: Past Medical History:  Diagnosis Date   Adenomatous colon polyp 06/13/2006   Allergy    SINUS   Anxiety    Anxiety and depression    Arthritis    Asthma    Bladder incontinence    Blood transfusion    Cataract    Depression    Fibromyalgia    GERD (gastroesophageal reflux disease)    History of kidney stones    Hyperlipidemia    Kidney stones    Obesity    Osteopenia  Post-operative nausea and vomiting     Past Surgical History: Past Surgical History:  Procedure Laterality Date   ABDOMINAL HYSTERECTOMY  1992   partial in 1994   APPENDECTOMY  1975   BACK SURGERY  2002   2002-2022 multi  x 6  fusions and rods   BREAST BIOPSY Bilateral 11/26/2017   FIBROCYSTIC CHANGES WITH CALCIFICATIONS    CERVICAL SPINE SURGERY  2014   with rod   COLONOSCOPY  06/12/2011   Russella Dar, 03/17/21 MS   ELBOW SURGERY  2003   EYE SURGERY Bilateral    cataract   FOOT SURGERY  1994   right   KIDNEY STONE SURGERY  2004   KNEE ARTHROSCOPY Left 2008   LUMBAR FUSION     2002,2003,2007,2008,2010, 2022 with fusion and rods   NASAL SINUS SURGERY  2003   NISSEN FUNDOPLICATION  2004   ROTATOR CUFF REPAIR Bilateral 1191,4782   SPINAL CORD STIMULATOR IMPLANT  2005   SPINAL CORD STIMULATOR IMPLANT  2004   SPINAL CORD STIMULATOR REMOVAL  2008   TOTAL KNEE ARTHROPLASTY Left 2008,2009   TOTAL KNEE ARTHROPLASTY Right 05/07/2022   Procedure: TOTAL KNEE ARTHROPLASTY;  Surgeon: Ollen Gross, MD;  Location: [facility];  Service: Orthopedics;  Laterality: Right;    Social History: Social History   Socioeconomic History   Marital status: Married    Spouse name: Not on file   Number of children: 3    Years of education: Not on file   Highest education level: Not on file  Occupational History   Occupation: Disabled  Tobacco Use   Smoking status: Former    Current packs/day: 0.00    Average packs/day: 2.0 packs/day for 5.0 years (10.0 ttl pk-yrs)    Types: Cigarettes    Start date: 11/02/2000    Quit date: 11/02/2005    Years since quitting: 17.5   Smokeless tobacco: Never  Vaping Use   Vaping status: Never Used  Substance and Sexual Activity   Alcohol use: No   Drug use: No   Sexual activity: Yes  Other Topics Concern   Not on file  Social History Narrative   Not on file   Social Determinants of Health   Financial Resource Strain: Low Risk  (07/17/2017)   Overall Financial Resource Strain (CARDIA)    Difficulty of Paying Living Expenses: Not hard at all  Food Insecurity: No Food Insecurity (05/07/2022)   Hunger Vital Sign    Worried About Running Out of Food in the Last Year: Never true    Ran Out of Food in the Last Year: Never true  Transportation Needs: No Transportation Needs (05/07/2022)   PRAPARE - Administrator, Civil Service (Medical): No    Lack of Transportation (Non-Medical): No  Physical Activity: Unknown (07/17/2017)   Exercise Vital Sign    Days of Exercise per Week: 0 days    Minutes of Exercise per Session: Not on file  Stress: No Stress Concern Present (07/17/2017)   [employer] of Occupational Health - Occupational Stress Questionnaire    Feeling of Stress : Not at all  Social Connections: Socially Integrated (07/17/2017)   Social Connection and Isolation Panel [NHANES]    Frequency of Communication with Friends and Family: More than three times a week    Frequency of Social Gatherings with Friends and Family: More than three times a week    Attends Religious Services: More than 4 times per year    Active Member of Clubs or  Organizations: Yes    Attends Engineer, structural: More than 4 times per year    Marital Status:  Married  Catering manager Violence: Not At Risk (05/07/2022)   Humiliation, Afraid, Rape, and Kick questionnaire    Fear of Current or Ex-Partner: No    Emotionally Abused: No    Physically Abused: No    Sexually Abused: No    Family History: Family History  Problem Relation Age of Onset   Heart disease Mother    Diabetes Mother    Melanoma Mother        nose   Stroke Father    Colon polyps Sister    Hypertension Sister    Skin cancer Sister    Diabetes Brother    Cirrhosis Brother    Hypertension Brother    Colon polyps Brother    Hypertension Brother    Stomach cancer Other    Rectal cancer Other    Esophageal cancer Other    Colon cancer Other     Review of Systems: ROS Denies any recent illness or infection.  Physical Exam: Vital Signs BP (!) 145/83 (BP Location: Left Arm, Patient Position: Sitting, Cuff Size: Large)   Pulse 63   Ht 5\' 4"  (1.626 m)   Wt 226 lb (102.5 kg)   SpO2 95%   BMI 38.79 kg/m   Physical Exam Constitutional:      General: Not in acute distress.    Appearance: Normal appearance. Not ill-appearing.  HENT:     Head: Normocephalic and atraumatic.  Eyes:     Pupils: Pupils are equal, round. Cardiovascular:     Rate and Rhythm: Normal rate.    Pulses: Normal pulses.  Pulmonary:     Effort: No respiratory distress or increased work of breathing.  Speaks in full sentences. Abdominal:     General: Abdomen is flat. No distension.   Musculoskeletal: Normal range of motion. No lower extremity swelling or edema.  Scattered varicosities noted. Skin:    General: Skin is warm and dry.     Findings: No erythema or rash.  Neurological:     Mental Status: Alert and oriented to person, place, and time.  Psychiatric:        Mood and Affect: Mood normal.        Behavior: Behavior normal.    Assessment/Plan: The patient is scheduled for bilateral breast reduction with possible liposuction with Dr. Ulice Bold.  Risks, benefits, and alternatives  of procedure discussed, questions answered and consent obtained.    Smoking Status: Non-smoker. Last Mammogram: Needs updated mammogram.  Patient understands that she will be canceled if it is not obtained ahead of surgery.  Caprini Score: 9; Risk Factors include: Age, BMI greater than 25, family history of thrombosis (mother), varicosities, and length of planned surgery. Recommendation for mechanical and possibly pharmacological prophylaxis.  Will discuss with Dr. Ulice Bold and prescribe Lovenox if deemed indicated.  Pictures obtained: 03/2022  Post-op Rx sent to pharmacy: Zofran and Keflex.  Will hold off on postoperative pain medication until clearance is received from her pain management specialist, Dr. Noel Gerold.  She is already on multiple sedative medications including oxycodone 7.5-325 3 times daily, gabapentin, trazodone, and baclofen.  Patient was provided with the General Surgical Risk consent document and Pain Medication Agreement prior to their appointment.  They had adequate time to read through the risk consent documents and Pain Medication Agreement. We also discussed them in person together during this preop appointment. All of their questions  were answered to their satisfaction.  Recommended calling if they have any further questions.  Risk consent form and Pain Medication Agreement to be scanned into patient's chart.  The risk that can be encountered with breast reduction were discussed and include the following but not limited to these:  Breast asymmetry, fluid accumulation, firmness of the breast, inability to breast feed, loss of nipple or areola, skin loss, decrease or no nipple sensation, fat necrosis of the breast tissue, bleeding, infection, healing delay.  There are risks of anesthesia, changes to skin sensation and injury to nerves or blood vessels.  The muscle can be temporarily or permanently injured.  You may have an allergic reaction to tape, suture, glue, blood products  which can result in skin discoloration, swelling, pain, skin lesions, poor healing.  Any of these can lead to the need for revisonal surgery or stage procedures.  A reduction has potential to interfere with diagnostic procedures.  Nipple or breast piercing can increase risks of infection.  This procedure is best done when the breast is fully developed.  Changes in the breast will continue to occur over time.  Pregnancy can alter the outcomes of previous breast reduction surgery, weight gain and weigh loss can also effect the long term appearance.   We discussed the possibility of amputation/free nipple graft technique due to the length of her STN.  She is understanding of the possibility that we would need to transition from a pedicle technique to a free nipple graft technique intraoperatively.  We discussed the risks associated with free nipple graft breast reductions, including but not limited to failure of the graft, partial loss of the graft, loss of sensation of bilateral nipple areola, complete loss of the nipple areola graft, inability to breast-feed, postoperative wounds, ongoing wound care.  We also discussed the risks associated with the pedicle technique.  We discussed that with the pedicle technique she could develop nipple areolar necrosis which would result in loss of the nipple, this would also result in ongoing wound care and possible changes in the shape of her breast.      Electronically signed by: Evelena Leyden, PA-C 05/28/2023 4:35 PM

## 2023-05-29 ENCOUNTER — Ambulatory Visit: Payer: Medicare Other | Admitting: Family Medicine

## 2023-05-29 ENCOUNTER — Encounter: Payer: Self-pay | Admitting: Family Medicine

## 2023-05-29 VITALS — BP 148/76 | HR 57 | Temp 96.7°F | Ht 64.0 in | Wt 223.6 lb

## 2023-05-29 DIAGNOSIS — N644 Mastodynia: Secondary | ICD-10-CM

## 2023-05-29 DIAGNOSIS — Z23 Encounter for immunization: Secondary | ICD-10-CM

## 2023-05-29 NOTE — Progress Notes (Signed)
Subjective:  Patient ID: Valerie Baker, female    DOB: [birth year], 67 y.o.   MRN: [id number]  Patient Care Team: Raliegh Ip, DO as PCP - General (Family Medicine) Smitty Cords, OD (Optometry) Patricia Nettle, MD (Orthopedic Surgery) Kathryne Hitch, MD as Consulting Physician (Orthopedic Surgery) Durene Romans, MD as Consulting Physician (Orthopedic Surgery)   Chief Complaint:  Breast Pain (Right x 3 days )   HPI: Valerie Baker is a 67 y.o. female presenting on 05/29/2023 for Breast Pain (Right x 3 days )   Discussed the use of AI scribe software for clinical note transcription with the patient, who gave verbal consent to proceed.  History of Present Illness   The patient, with a history of pendulous breasts and recent consultation for breast reduction, presents with acute onset of right breast pain. The pain, which began two nights prior, was severe enough to prevent the patient from lying on her side. The pain is localized to a specific area in the right breast and is exacerbated by movement and palpation. The patient reports no associated symptoms such as shortness of breath, dizziness, nausea, or vomiting. She does mention occasional dizziness, but attributes it to her medications and neck condition. The patient denies any other symptoms such as jaw, neck, arm, or back pain radiating from the area of discomfort. The patient also reports no increased fatigue, vomiting, or unusual sweating.          Relevant past medical, surgical, family, and social history reviewed and updated as indicated.  Allergies and medications reviewed and updated. Data reviewed: Chart in Epic.   Past Medical History:  Diagnosis Date   Adenomatous colon polyp 06/13/2006   Allergy    SINUS   Anxiety    Anxiety and depression    Arthritis    Asthma    Bladder incontinence    Blood transfusion    Cataract    Depression    Fibromyalgia    GERD (gastroesophageal reflux  disease)    History of kidney stones    Hyperlipidemia    Kidney stones    Obesity    Osteopenia    Post-operative nausea and vomiting     Past Surgical History:  Procedure Laterality Date   ABDOMINAL HYSTERECTOMY  1992   partial in 1994   APPENDECTOMY  1975   BACK SURGERY  2002   2002-2022 multi  x 6  fusions and rods   BREAST BIOPSY Bilateral 11/26/2017   FIBROCYSTIC CHANGES WITH CALCIFICATIONS    CERVICAL SPINE SURGERY  2014   with rod   COLONOSCOPY  06/12/2011   Russella Dar, 03/17/21 MS   ELBOW SURGERY  2003   EYE SURGERY Bilateral    cataract   FOOT SURGERY  1994   right   KIDNEY STONE SURGERY  2004   KNEE ARTHROSCOPY Left 2008   LUMBAR FUSION     2002,2003,2007,2008,2010, 2022 with fusion and rods   NASAL SINUS SURGERY  2003   NISSEN FUNDOPLICATION  2004   ROTATOR CUFF REPAIR Bilateral [id number]   SPINAL CORD STIMULATOR IMPLANT  2005   SPINAL CORD STIMULATOR IMPLANT  2004   SPINAL CORD STIMULATOR REMOVAL  2008   TOTAL KNEE ARTHROPLASTY Left 2008,2009   TOTAL KNEE ARTHROPLASTY Right 05/07/2022   Procedure: TOTAL KNEE ARTHROPLASTY;  Surgeon: Ollen Gross, MD;  Location: [facility];  Service: Orthopedics;  Laterality: Right;    Social History   Socioeconomic History   Marital status:  Married    Spouse name: Not on file   Number of children: 3   Years of education: Not on file   Highest education level: Not on file  Occupational History   Occupation: Disabled  Tobacco Use   Smoking status: Former    Current packs/day: 0.00    Average packs/day: 2.0 packs/day for 5.0 years (10.0 ttl pk-yrs)    Types: Cigarettes    Start date: 11/02/2000    Quit date: 11/02/2005    Years since quitting: 17.5   Smokeless tobacco: Never  Vaping Use   Vaping status: Never Used  Substance and Sexual Activity   Alcohol use: No   Drug use: No   Sexual activity: Yes  Other Topics Concern   Not on file  Social History Narrative   Not on file   Social Determinants of Health    Financial Resource Strain: Low Risk  (07/17/2017)   Overall Financial Resource Strain (CARDIA)    Difficulty of Paying Living Expenses: Not hard at all  Food Insecurity: No Food Insecurity (05/07/2022)   Hunger Vital Sign    Worried About Running Out of Food in the Last Year: Never true    Ran Out of Food in the Last Year: Never true  Transportation Needs: No Transportation Needs (05/07/2022)   PRAPARE - Administrator, Civil Service (Medical): No    Lack of Transportation (Non-Medical): No  Physical Activity: Unknown (07/17/2017)   Exercise Vital Sign    Days of Exercise per Week: 0 days    Minutes of Exercise per Session: Not on file  Stress: No Stress Concern Present (07/17/2017)   [employer] of Occupational Health - Occupational Stress Questionnaire    Feeling of Stress : Not at all  Social Connections: Socially Integrated (07/17/2017)   Social Connection and Isolation Panel [NHANES]    Frequency of Communication with Friends and Family: More than three times a week    Frequency of Social Gatherings with Friends and Family: More than three times a week    Attends Religious Services: More than 4 times per year    Active Member of [employer] or Organizations: Yes    Attends Engineer, structural: More than 4 times per year    Marital Status: Married  Catering manager Violence: Not At Risk (05/07/2022)   Humiliation, Afraid, Rape, and Kick questionnaire    Fear of Current or Ex-Partner: No    Emotionally Abused: No    Physically Abused: No    Sexually Abused: No    Outpatient Encounter Medications as of 05/29/2023  Medication Sig   albuterol (VENTOLIN HFA) 108 (90 Base) MCG/ACT inhaler Inhale 2 puffs into the lungs every 6 (six) hours as needed for wheezing or shortness of breath.   ascorbic acid (VITAMIN C) 500 MG tablet Take 500 mg by mouth daily.   baclofen (LIORESAL) 10 MG tablet TAKE 1/2 TO 1 TABLET BY  MOUTH 3 TIMES DAILY AS  NEEDED FOR MUSCLE SPASMS  (Patient taking differently: Take 10 mg by mouth 3 (three) times daily.)   buPROPion ER (WELLBUTRIN SR) 100 MG 12 hr tablet Take 1 tablet (100 mg total) by mouth in the morning.   Calcium Carbonate-Vitamin D 600-400 MG-UNIT tablet Take 1 tablet by mouth 2 (two) times daily.   cephALEXin (KEFLEX) 500 MG capsule Take 1 capsule (500 mg total) by mouth 4 (four) times daily for 3 days.   Cholecalciferol (VITAMIN D3) 2000 UNITS TABS Take 2,000 Units by  mouth 2 (two) times daily.   cycloSPORINE (RESTASIS) 0.05 % ophthalmic emulsion Place 1 drop into both eyes 2 (two) times daily.   DULoxetine (CYMBALTA) 30 MG capsule Take 1 capsule (30 mg total) by mouth daily.   fluocinonide gel (LIDEX) 0.05 % Apply 0.5gm to affected areas as directed   fluticasone (FLONASE) 50 MCG/ACT nasal spray Place 2 sprays into both nostrils at bedtime.   gabapentin (NEURONTIN) 400 MG capsule Take 2 capsules (800 mg total) by mouth 3 (three) times daily.   loratadine (CLARITIN) 10 MG tablet Take 10 mg by mouth daily.   meloxicam (MOBIC) 15 MG tablet Take 15 mg by mouth daily.   Multiple Vitamin (MULTIVITAMIN) tablet Take 1 tablet by mouth daily.   omeprazole (PRILOSEC) 40 MG capsule TAKE 1 CAPSULE BY MOUTH TWICE  DAILY   ondansetron (ZOFRAN-ODT) 4 MG disintegrating tablet Take 1 tablet (4 mg total) by mouth every 8 (eight) hours as needed for nausea or vomiting.   OVER THE COUNTER MEDICATION Fish Oil-Take 2 capsule by mouth daily.   OVER THE COUNTER MEDICATION Zinc 50mg -Take 1 capsule by mouth daily.   oxybutynin (DITROPAN) 5 MG tablet TAKE 1 TABLET BY MOUTH TWICE  DAILY   oxyCODONE-acetaminophen (PERCOCET) 7.5-325 MG tablet Take 1 tablet by mouth 2 (two) times daily as needed for severe pain.   oxyCODONE-acetaminophen (PERCOCET) 7.5-325 MG tablet Take 1 tablet by mouth 2 (two) times daily as needed for severe pain.   oxyCODONE-acetaminophen (PERCOCET) 7.5-325 MG tablet Take 1 tablet by mouth 2 (two) times daily as needed for  severe pain.   Povidone, PF, (IVIZIA DRY EYES) 0.5 % SOLN Place 1 drop into both eyes every evening.   traZODone (DESYREL) 100 MG tablet TAKE 1 TABLET BY MOUTH AT  BEDTIME AS NEEDED FOR SLEEP  /INSOMNIA   zinc gluconate 50 MG tablet Take 50 mg by mouth daily.   [DISCONTINUED] predniSONE (DELTASONE) 20 MG tablet 2 po at sametime daily for 5 days-   [DISCONTINUED] promethazine-dextromethorphan (PROMETHAZINE-DM) 6.25-15 MG/5ML syrup Take 5 mLs by mouth 4 (four) times daily as needed for cough.   No facility-administered encounter medications on file as of 05/29/2023.    Allergies  Allergen Reactions   Latex Itching and Rash   Sulfonamide Derivatives Rash    Pertinent ROS per HPI, otherwise unremarkable      Objective:  BP (!) 160/86   Pulse (!) 57   Temp (!) 96.7 F (35.9 C) (Temporal)   Ht 5\' 4"  (1.626 m)   Wt 223 lb 9.6 oz (101.4 kg)   SpO2 98%   BMI 38.38 kg/m    Wt Readings from Last 3 Encounters:  05/29/23 223 lb 9.6 oz (101.4 kg)  05/28/23 226 lb (102.5 kg)  03/29/23 225 lb (102.1 kg)    Physical Exam Exam conducted with a chaperone present.  Chest:     Chest wall: Tenderness present. No mass, lacerations, deformity, swelling, crepitus or edema. There is no dullness to percussion.  Breasts:    Tanner Score is 5.     Right: Tenderness present. No swelling, bleeding, inverted nipple, mass, nipple discharge or skin change.     Left: Normal.    Lymphadenopathy:     Upper Body:     Right upper body: No supraclavicular, axillary or pectoral adenopathy.    Physical Exam   BREAST: Right breast tenderness upon palpation, exacerbated by movement.        Results for orders placed or performed in visit on  10/02/22  ToxASSURE Select 13 (MW), Urine  Result Value Ref Range   Summary Note   TSH  Result Value Ref Range   TSH 3.450 0.450 - 4.500 uIU/mL  T4, Free  Result Value Ref Range   Free T4 1.17 0.82 - 1.77 ng/dL  [id number]  Result Value Ref Range   Glucose  77 70 - 99 mg/dL   BUN 18 8 - 27 mg/dL   Creatinine, Ser 2.69 0.57 - 1.00 mg/dL   eGFR 88 >48 NI/OEV/0.35   BUN/Creatinine Ratio 24 12 - 28   Sodium 141 134 - 144 mmol/L   Potassium 4.6 3.5 - 5.2 mmol/L   Chloride 104 96 - 106 mmol/L   CO2 24 20 - 29 mmol/L   Calcium 9.2 8.7 - 10.3 mg/dL   Total Protein 5.8 (L) 6.0 - 8.5 g/dL   Albumin 4.0 3.9 - 4.9 g/dL   Globulin, Total 1.8 1.5 - 4.5 g/dL   Albumin/Globulin Ratio 2.2 1.2 - 2.2   Bilirubin Total 0.5 0.0 - 1.2 mg/dL   Alkaline Phosphatase 111 44 - 121 IU/L   AST 30 0 - 40 IU/L   ALT 28 0 - 32 IU/L  Bayer DCA Hb A1c Waived  Result Value Ref Range   HB A1C (BAYER DCA - WAIVED) 6.2 (H) 4.8 - 5.6 %  Vitamin B12  Result Value Ref Range   Vitamin B-12 823 232 - 1,245 pg/mL  CBC  Result Value Ref Range   WBC 4.5 3.4 - 10.8 x10E3/uL   RBC 3.44 (L) 3.77 - 5.28 x10E6/uL   Hemoglobin 10.6 (L) 11.1 - 15.9 g/dL   Hematocrit 00.9 (L) 38.1 - 46.6 %   MCV 91 79 - 97 fL   MCH 30.8 26.6 - 33.0 pg   MCHC 33.8 31.5 - 35.7 g/dL   RDW 82.9 93.7 - 16.9 %   Platelets 202 150 - 450 x10E3/uL  VITAMIN D 25 Hydroxy (Vit-D Deficiency, Fractures)  Result Value Ref Range   Vit D, 25-Hydroxy 74.3 30.0 - 100.0 ng/mL     EKG: SB 56, PR 180 ms, QT 404 ms, no acute St-T changes, no changes from prior EKG. Kari Baars, FNP-C  Pertinent labs & imaging results that were available during my care of the patient were reviewed by me and considered in my medical decision making.  Assessment & Plan:  Valerie Baker was seen today for breast pain.  Diagnoses and all orders for this visit:  Breast pain, right -     EKG 12-Lead  Encounter for immunization -     Flu Vaccine Trivalent High Dose (Fluad)     Assessment and Plan    Right Breast Pain Acute onset, localized, sharp pain exacerbated by movement and palpation. Likely musculoskeletal in nature due to pendulous breasts. No signs of acute coronary syndrome. -Obtained EKG to rule out cardiac etiology, no  acute changes noted. Symptomatic care discussed in detail. Report new, worsening, or persistent symptoms.   Influenza Vaccination Patient due for annual influenza vaccination. -Administer influenza vaccine today after completion of EKG.          Continue all other maintenance medications.  Follow up plan: Return if symptoms worsen or fail to improve.   Continue healthy lifestyle choices, including diet (rich in fruits, vegetables, and lean proteins, and low in salt and simple carbohydrates) and exercise (at least 30 minutes of moderate physical activity daily).   The above assessment and management plan was discussed with the patient. The  patient verbalized understanding of and has agreed to the management plan. Patient is aware to call the clinic if they develop any new symptoms or if symptoms persist or worsen. Patient is aware when to return to the clinic for a follow-up visit. Patient educated on when it is appropriate to go to the emergency department.   Kari Baars, FNP-C [facility] [phone]

## 2023-06-06 ENCOUNTER — Telehealth: Payer: Self-pay | Admitting: Plastic Surgery

## 2023-06-06 MED ORDER — CEPHALEXIN 500 MG PO CAPS: 500.0000 mg | ORAL_CAPSULE | Freq: Four times a day (QID) | ORAL | 0 refills | Status: AC

## 2023-06-06 MED ORDER — ONDANSETRON 4 MG PO TBDP: 4.0000 mg | ORAL_TABLET | Freq: Three times a day (TID) | ORAL | 0 refills | Status: DC | PRN

## 2023-06-06 NOTE — Telephone Encounter (Signed)
OK, for some reason there was a Human resources officer. I have resent the medications to the [facility] in [place].  The only medication that I have not yet sent is the pain medication given that I am still waiting to hear back from Dr. Noel Gerold [facility]. Since they manage your chronic pain and you are on multiple sedating medications, we will need guidance from them prior to giving you any additional narcotics medication.

## 2023-06-06 NOTE — Telephone Encounter (Signed)
Patient would like to know if her prescription was sent out and if it was sent to the right pharmacy? She would like it sent to [facility] in [place], please reach out to her when completed.

## 2023-06-06 NOTE — Addendum Note (Signed)
Addended by: Evelena Leyden on: 06/06/2023 08:36 AM   Modules accepted: Orders

## 2023-06-07 ENCOUNTER — Encounter: Payer: 59 | Admitting: Student

## 2023-06-12 ENCOUNTER — Other Ambulatory Visit: Payer: Self-pay | Admitting: Family Medicine

## 2023-06-12 ENCOUNTER — Telehealth: Payer: Self-pay | Admitting: *Deleted

## 2023-06-12 DIAGNOSIS — R350 Frequency of micturition: Secondary | ICD-10-CM

## 2023-06-12 DIAGNOSIS — K219 Gastro-esophageal reflux disease without esophagitis: Secondary | ICD-10-CM

## 2023-06-12 MED ORDER — OMEPRAZOLE 40 MG PO CPDR: 40.0000 mg | DELAYED_RELEASE_CAPSULE | Freq: Two times a day (BID) | ORAL | 0 refills | Status: DC

## 2023-06-12 MED ORDER — OXYBUTYNIN CHLORIDE 5 MG PO TABS: 5.0000 mg | ORAL_TABLET | Freq: Two times a day (BID) | ORAL | 0 refills | Status: DC

## 2023-06-12 NOTE — Addendum Note (Signed)
Addended by: Julious Payer D on: 06/12/2023 08:48 AM   Modules accepted: Orders

## 2023-06-12 NOTE — Telephone Encounter (Signed)
Per PA during preop it was discovered that patient had weight change of about 20lbs from consult to preop which would cause a change in surgery plan for amount of tissue needed for removal to meet plan med nec criteria. Dr. Ulice Bold advised surgery would need to be cancelled until patient's weight returned to her consult weight.  Advised patient of need to postpone case. Patient verbalized understanding that case would be canceled and the reason why.

## 2023-06-12 NOTE — Telephone Encounter (Signed)
Med refill appt scheduled for first available 09/10/2023  CPE Appt 11/18/2023 Please send refills to pharmacy

## 2023-06-12 NOTE — Telephone Encounter (Signed)
[place] NTBS for 6 mos FU last OV in Feb, NO RF sent to mail order pharmacy

## 2023-06-14 ENCOUNTER — Other Ambulatory Visit: Payer: Self-pay | Admitting: Nurse Practitioner

## 2023-06-14 DIAGNOSIS — J40 Bronchitis, not specified as acute or chronic: Secondary | ICD-10-CM

## 2023-06-18 ENCOUNTER — Ambulatory Visit: Payer: 59

## 2023-06-19 ENCOUNTER — Ambulatory Visit (HOSPITAL_BASED_OUTPATIENT_CLINIC_OR_DEPARTMENT_OTHER): Admit: 2023-06-19 | Payer: 59 | Admitting: Plastic Surgery

## 2023-06-19 ENCOUNTER — Encounter (HOSPITAL_BASED_OUTPATIENT_CLINIC_OR_DEPARTMENT_OTHER): Payer: Self-pay

## 2023-06-19 SURGERY — MAMMOPLASTY, REDUCTION
Anesthesia: General | Site: Breast | Laterality: Bilateral

## 2023-06-25 ENCOUNTER — Ambulatory Visit: Payer: Medicare Other | Admitting: Nurse Practitioner

## 2023-06-27 ENCOUNTER — Encounter (HOSPITAL_BASED_OUTPATIENT_CLINIC_OR_DEPARTMENT_OTHER): Payer: Self-pay

## 2023-06-27 ENCOUNTER — Ambulatory Visit (HOSPITAL_BASED_OUTPATIENT_CLINIC_OR_DEPARTMENT_OTHER): Admit: 2023-06-27 | Payer: Medicare Other | Admitting: Plastic Surgery

## 2023-06-27 SURGERY — BREAST REDUCTION WITH LIPOSUCTION
Anesthesia: Choice | Laterality: Bilateral

## 2023-06-28 ENCOUNTER — Encounter: Payer: 59 | Admitting: Plastic Surgery

## 2023-07-01 ENCOUNTER — Ambulatory Visit (INDEPENDENT_AMBULATORY_CARE_PROVIDER_SITE_OTHER): Payer: Medicare Other

## 2023-07-01 VITALS — Ht 63.0 in | Wt 222.0 lb

## 2023-07-01 DIAGNOSIS — Z0001 Encounter for general adult medical examination with abnormal findings: Secondary | ICD-10-CM

## 2023-07-01 DIAGNOSIS — Z0000 Encounter for general adult medical examination without abnormal findings: Secondary | ICD-10-CM

## 2023-07-01 DIAGNOSIS — Z780 Asymptomatic menopausal state: Secondary | ICD-10-CM

## 2023-07-01 NOTE — Patient Instructions (Signed)
Ms. Hillian , Thank you for taking time to come for your Medicare Wellness Visit. I appreciate your ongoing commitment to your health goals. Please review the following plan we discussed and let me know if I can assist you in the future.   Referrals/Orders/Follow-Ups/Clinician Recommendations: Aim for 30 minutes of exercise or brisk walking, 6-8 glasses of water, and 5 servings of fruits and vegetables each day.   This is a list of the screening recommended for you and due dates:  Health Maintenance  Topic Date Due   DEXA scan (bone density measurement)  12/28/2022   Medicare Annual Wellness Visit  06/30/2024   DTaP/Tdap/Td vaccine (2 - Td or Tdap) 11/03/2024   Mammogram  05/27/2025   Colon Cancer Screening  03/17/2028   Pneumonia Vaccine  Completed   Flu Shot  Completed   Hepatitis C Screening  Completed   Zoster (Shingles) Vaccine  Completed   HPV Vaccine  Aged Out   COVID-19 Vaccine  Discontinued    Advanced directives: (Provided) Advance directive discussed with you today. I have provided a copy for you to complete at home and have notarized. Once this is complete, please bring a copy in to our office so we can scan it into your chart. Information on Advanced Care Planning can be found at [facility] Advance Health Care Directives Advance Health Care Directives ([email or web address]    Next Medicare Annual Wellness Visit scheduled for next year: Yes  Insert Preventive Care attachment Insert FALL PREVENTION attachment if needed

## 2023-07-01 NOTE — Progress Notes (Signed)
Subjective:   Valerie Baker is a 67 y.o. female who presents for Medicare Annual (Subsequent) preventive examination.  Visit Complete: Virtual I connected with  Raeanne Gathers on 07/01/23 by a audio enabled telemedicine application and verified that I am speaking with the correct person using two identifiers.  Patient Location: Home  Provider Location: Home Office  I discussed the limitations of evaluation and management by telemedicine. The patient expressed understanding and agreed to proceed.  Vital Signs: Because this visit was a virtual/telehealth visit, some criteria may be missing or patient reported. Any vitals not documented were not able to be obtained and vitals that have been documented are patient reported.  Patient Medicare AWV questionnaire was completed by the patient on 07/01/2023; I have confirmed that all information answered by patient is correct and no changes since this date.  Cardiac Risk Factors include: advanced age (>41men, >63 women);dyslipidemia;hypertension     Objective:    Today's Vitals   07/01/23 1135  Weight: 222 lb (100.7 kg)  Height: 5\' 3"  (1.6 m)   Body mass index is 39.33 kg/m.     07/01/2023   11:40 AM 05/07/2022   11:05 AM 04/25/2022    9:42 AM 05/04/2020   10:54 AM 07/17/2017    9:41 AM 09/28/2015   10:39 AM 11/03/2014    3:29 PM  Advanced Directives  Does Patient Have a Medical Advance Directive? No No No No Yes Yes No;Yes  Type of Merchandiser, retail of [place];Living will  Healthcare Power of [place];Living will  Copy of Healthcare Power of Attorney in Chart?     No - copy requested  No - copy requested  Would patient like information on creating a medical advance directive? Yes (MAU/Ambulatory/Procedural Areas - Information given) No - Patient declined No - Patient declined        Current Medications (verified) Outpatient Encounter Medications as of 07/01/2023  Medication Sig   albuterol  (VENTOLIN HFA) 108 (90 Base) MCG/ACT inhaler TAKE 2 PUFFS BY MOUTH EVERY 6 HOURS AS NEEDED FOR WHEEZE OR SHORTNESS OF BREATH   ascorbic acid (VITAMIN C) 500 MG tablet Take 500 mg by mouth daily.   baclofen (LIORESAL) 10 MG tablet TAKE 1/2 TO 1 TABLET BY  MOUTH 3 TIMES DAILY AS  NEEDED FOR MUSCLE SPASMS (Patient taking differently: Take 10 mg by mouth 3 (three) times daily.)   buPROPion ER (WELLBUTRIN SR) 100 MG 12 hr tablet Take 1 tablet (100 mg total) by mouth in the morning.   Calcium Carbonate-Vitamin D 600-400 MG-UNIT tablet Take 1 tablet by mouth 2 (two) times daily.   Cholecalciferol (VITAMIN D3) 2000 UNITS TABS Take 2,000 Units by mouth 2 (two) times daily.   cycloSPORINE (RESTASIS) 0.05 % ophthalmic emulsion Place 1 drop into both eyes 2 (two) times daily.   DULoxetine (CYMBALTA) 30 MG capsule Take 1 capsule (30 mg total) by mouth daily.   fluocinonide gel (LIDEX) 0.05 % Apply 0.5gm to affected areas as directed   fluticasone (FLONASE) 50 MCG/ACT nasal spray Place 2 sprays into both nostrils at bedtime.   gabapentin (NEURONTIN) 400 MG capsule Take 2 capsules (800 mg total) by mouth 3 (three) times daily.   loratadine (CLARITIN) 10 MG tablet Take 10 mg by mouth daily.   meloxicam (MOBIC) 15 MG tablet Take 15 mg by mouth daily.   Multiple Vitamin (MULTIVITAMIN) tablet Take 1 tablet by mouth daily.   omeprazole (PRILOSEC) 40 MG capsule Take 1  capsule (40 mg total) by mouth 2 (two) times daily.   ondansetron (ZOFRAN-ODT) 4 MG disintegrating tablet Take 1 tablet (4 mg total) by mouth every 8 (eight) hours as needed for nausea or vomiting.   OVER THE COUNTER MEDICATION Fish Oil-Take 2 capsule by mouth daily.   OVER THE COUNTER MEDICATION Zinc 50mg -Take 1 capsule by mouth daily.   oxybutynin (DITROPAN) 5 MG tablet Take 1 tablet (5 mg total) by mouth 2 (two) times daily.   oxyCODONE-acetaminophen (PERCOCET) 7.5-325 MG tablet Take 1 tablet by mouth 2 (two) times daily as needed for severe pain.    oxyCODONE-acetaminophen (PERCOCET) 7.5-325 MG tablet Take 1 tablet by mouth 2 (two) times daily as needed for severe pain.   oxyCODONE-acetaminophen (PERCOCET) 7.5-325 MG tablet Take 1 tablet by mouth 2 (two) times daily as needed for severe pain.   Povidone, PF, (IVIZIA DRY EYES) 0.5 % SOLN Place 1 drop into both eyes every evening.   traZODone (DESYREL) 100 MG tablet TAKE 1 TABLET BY MOUTH AT  BEDTIME AS NEEDED FOR SLEEP  /INSOMNIA   zinc gluconate 50 MG tablet Take 50 mg by mouth daily.   No facility-administered encounter medications on file as of 07/01/2023.    Allergies (verified) Latex and Sulfonamide derivatives   History: Past Medical History:  Diagnosis Date   Adenomatous colon polyp 06/13/2006   Allergy    SINUS   Anxiety    Anxiety and depression    Arthritis    Asthma    Bladder incontinence    Blood transfusion    Cataract    Depression    Fibromyalgia    GERD (gastroesophageal reflux disease)    History of kidney stones    Hyperlipidemia    Kidney stones    Obesity    Osteopenia    Post-operative nausea and vomiting    Past Surgical History:  Procedure Laterality Date   ABDOMINAL HYSTERECTOMY  1992   partial in 1994   APPENDECTOMY  1975   BACK SURGERY  2002   2002-2022 multi  x 6  fusions and rods   BREAST BIOPSY Bilateral 11/26/2017   FIBROCYSTIC CHANGES WITH CALCIFICATIONS    CERVICAL SPINE SURGERY  2014   with rod   COLONOSCOPY  06/12/2011   Russella Dar, 03/17/21 MS   ELBOW SURGERY  2003   EYE SURGERY Bilateral    cataract   FOOT SURGERY  1994   right   KIDNEY STONE SURGERY  2004   KNEE ARTHROSCOPY Left 2008   LUMBAR FUSION     2002,2003,2007,2008,2010, 2022 with fusion and rods   NASAL SINUS SURGERY  2003   NISSEN FUNDOPLICATION  2004   ROTATOR CUFF REPAIR Bilateral 4098,1191   SPINAL CORD STIMULATOR IMPLANT  2005   SPINAL CORD STIMULATOR IMPLANT  2004   SPINAL CORD STIMULATOR REMOVAL  2008   TOTAL KNEE ARTHROPLASTY Left 2008,2009   TOTAL  KNEE ARTHROPLASTY Right 05/07/2022   Procedure: TOTAL KNEE ARTHROPLASTY;  Surgeon: Ollen Gross, MD;  Location: [facility];  Service: Orthopedics;  Laterality: Right;   Family History  Problem Relation Age of Onset   Heart disease Mother    Diabetes Mother    Melanoma Mother        nose   Stroke Father    Colon polyps Sister    Hypertension Sister    Skin cancer Sister    Diabetes Brother    Cirrhosis Brother    Hypertension Brother    Colon polyps Brother  Hypertension Brother    Stomach cancer Other    Rectal cancer Other    Esophageal cancer Other    Colon cancer Other    Social History   Socioeconomic History   Marital status: Married    Spouse name: Not on file   Number of children: 3   Years of education: Not on file   Highest education level: Not on file  Occupational History   Occupation: Disabled  Tobacco Use   Smoking status: Former    Current packs/day: 0.00    Average packs/day: 2.0 packs/day for 5.0 years (10.0 ttl pk-yrs)    Types: Cigarettes    Start date: 11/02/2000    Quit date: 11/02/2005    Years since quitting: 17.6   Smokeless tobacco: Never  Vaping Use   Vaping status: Never Used  Substance and Sexual Activity   Alcohol use: No   Drug use: No   Sexual activity: Yes  Other Topics Concern   Not on file  Social History Narrative   Not on file   Social Determinants of Health   Financial Resource Strain: Low Risk  (07/01/2023)   Overall Financial Resource Strain (CARDIA)    Difficulty of Paying Living Expenses: Not hard at all  Food Insecurity: No Food Insecurity (07/01/2023)   Hunger Vital Sign    Worried About Running Out of Food in the Last Year: Never true    Ran Out of Food in the Last Year: Never true  Transportation Needs: No Transportation Needs (07/01/2023)   PRAPARE - Administrator, Civil Service (Medical): No    Lack of Transportation (Non-Medical): No  Physical Activity: Insufficiently Active (07/01/2023)    Exercise Vital Sign    Days of Exercise per Week: 2 days    Minutes of Exercise per Session: 20 min  Stress: No Stress Concern Present (07/01/2023)   [employer] of Occupational Health - Occupational Stress Questionnaire    Feeling of Stress : Not at all  Social Connections: Moderately Integrated (07/01/2023)   Social Connection and Isolation Panel [NHANES]    Frequency of Communication with Friends and Family: More than three times a week    Frequency of Social Gatherings with Friends and Family: More than three times a week    Attends Religious Services: More than 4 times per year    Active Member of [employer] or Organizations: No    Attends Engineer, structural: Never    Marital Status: Married    Tobacco Counseling Counseling given: Not Answered   Clinical Intake:  Pre-visit preparation completed: Yes  Pain : No/denies pain     Nutritional Risks: None Diabetes: No  How often do you need to have someone help you when you read instructions, pamphlets, or other written materials from your doctor or pharmacy?: 1 - Never  Interpreter Needed?: No  Information entered by :: Renie Ora, LPN   Activities of Daily Living    07/01/2023   11:40 AM  In your present state of health, do you have any difficulty performing the following activities:  Hearing? 0  Vision? 0  Difficulty concentrating or making decisions? 0  Walking or climbing stairs? 0  Dressing or bathing? 0  Doing errands, shopping? 0  Preparing Food and eating ? N  Using the Toilet? N  In the past six months, have you accidently leaked urine? N  Do you have problems with loss of bowel control? N  Managing your Medications? N  Managing your  Finances? N  Housekeeping or managing your Housekeeping? N    Patient Care Team: Raliegh Ip, DO as PCP - General (Family Medicine) Smitty Cords, OD (Optometry) Patricia Nettle, MD (Orthopedic Surgery) Kathryne Hitch, MD as Consulting  Physician (Orthopedic Surgery) Durene Romans, MD as Consulting Physician (Orthopedic Surgery)  Indicate any recent Medical Services you may have received from other than [facility] providers in the past year (date may be approximate).     Assessment:   This is a routine wellness examination for [facility].  Hearing/Vision screen Vision Screening - Comments:: Wears rx glasses - up to date with routine eye exams with  Dr.Le    Goals Addressed             This Visit's Progress    DIET - EAT MORE FRUITS AND VEGETABLES         Depression Screen    07/01/2023   11:38 AM 03/29/2023    9:47 AM 10/02/2022    9:37 AM 09/19/2022    9:08 AM 09/11/2022   11:27 AM 06/15/2022    8:26 AM 03/14/2022   10:09 AM  PHQ 2/9 Scores  PHQ - 2 Score 0 0 0 0 0 0 0  PHQ- 9 Score  0 0 0 0      Fall Risk    07/01/2023   11:36 AM 03/29/2023    9:47 AM 10/02/2022    9:37 AM 09/19/2022    9:08 AM 09/11/2022   11:27 AM  Fall Risk   Falls in the past year? 0 0 0 0 0  Number falls in past yr: 0    0  Injury with Fall? 0    0  Risk for fall due to : No Fall Risks    No Fall Risks  Follow up Falls prevention discussed    Falls evaluation completed    MEDICARE RISK AT HOME: Medicare Risk at Home Any stairs in or around the home?: No If so, are there any without handrails?: No Home free of loose throw rugs in walkways, pet beds, electrical cords, etc?: Yes Adequate lighting in your home to reduce risk of falls?: Yes Life alert?: No Use of a cane, walker or w/c?: No Grab bars in the bathroom?: Yes Shower chair or bench in shower?: Yes Elevated toilet seat or a handicapped toilet?: Yes  TIMED UP AND GO:  Was the test performed?  No    Cognitive Function:    07/17/2017   12:50 PM 11/03/2014    3:33 PM  MMSE - Mini Mental State Exam  Orientation to time 5 5  Orientation to Place 5 5  Registration 3 3  Attention/ Calculation 5 5  Recall 3 3  Language- name 2 objects 2 2  Language- repeat 1 1  Language-  follow 3 step command 3 3  Language- read & follow direction 1 1  Write a sentence 1 1  Copy design 1 1  Total score 30 30        07/01/2023   11:41 AM  6CIT Screen  What Year? 0 points  What month? 0 points  What time? 0 points  Count back from 20 0 points  Months in reverse 0 points  Repeat phrase 0 points  Total Score 0 points    Immunizations Immunization History  Administered Date(s) Administered   Fluad Quad(high Dose 65+) 05/30/2021, 06/18/2022   Fluad Trivalent(High Dose 65+) 05/29/2023   H1N1 08/26/2008   Influenza,inj,Quad PF,6+ Mos 06/01/2015,  05/21/2016, 06/24/2017, 06/17/2018, 06/10/2019, 06/16/2020   Influenza,inj,quad, With Preservative 05/14/2019   Influenza-Unspecified 05/13/2013, 05/26/2014   Moderna Sars-Covid-2 Vaccination 02/17/2020, 03/16/2020   Novel Infuenza-h1n1-09 08/26/2008   PNEUMOCOCCAL CONJUGATE-20 08/21/2021   Tdap 11/04/2014   Zoster Recombinant(Shingrix) 08/21/2021, 11/21/2021    TDAP status: Up to date  Flu Vaccine status: Up to date  Pneumococcal vaccine status: Up to date  Covid-19 vaccine status: Completed vaccines  Qualifies for Shingles Vaccine? Yes   Zostavax completed Yes   Shingrix Completed?: Yes  Screening Tests Health Maintenance  Topic Date Due   DEXA SCAN  12/28/2022   Medicare Annual Wellness (AWV)  06/30/2024   DTaP/Tdap/Td (2 - Td or Tdap) 11/03/2024   MAMMOGRAM  05/27/2025   Colonoscopy  03/17/2028   Pneumonia Vaccine 19+ Years old  Completed   INFLUENZA VACCINE  Completed   Hepatitis C Screening  Completed   Zoster Vaccines- Shingrix  Completed   HPV VACCINES  Aged Out   COVID-19 Vaccine  Discontinued    Health Maintenance  Health Maintenance Due  Topic Date Due   DEXA SCAN  12/28/2022    Colorectal cancer screening: Type of screening: Colonoscopy. Completed 03/17/2021. Repeat every 7 years  Mammogram status: Completed 05/28/2023. Repeat every year  Bone Density status: Ordered 07/01/2023.  Pt provided with contact info and advised to call to schedule appt.  Lung Cancer Screening: (Low Dose CT Chest recommended if Age 49-80 years, 20 pack-year currently smoking OR have quit w/in 15years.) does not qualify.   Lung Cancer Screening Referral: n/a  Additional Screening:  Hepatitis C Screening: does not qualify; Completed 01/22/2020  Vision Screening: Recommended annual ophthalmology exams for early detection of glaucoma and other disorders of the eye. Is the patient up to date with their annual eye exam?  Yes  Who is the provider or what is the name of the office in which the patient attends annual eye exams? Dr.Le  If pt is not established with a provider, would they like to be referred to a provider to establish care? No .   Dental Screening: Recommended annual dental exams for proper oral hygiene   Community Resource Referral / Chronic Care Management: CRR required this visit?  No   CCM required this visit?  No     Plan:     I have personally reviewed and noted the following in the patient's chart:   Medical and social history Use of alcohol, tobacco or illicit drugs  Current medications and supplements including opioid prescriptions. Patient is not currently taking opioid prescriptions. Functional ability and status Nutritional status Physical activity Advanced directives List of other physicians Hospitalizations, surgeries, and ER visits in previous 12 months Vitals Screenings to include cognitive, depression, and falls Referrals and appointments  In addition, I have reviewed and discussed with patient certain preventive protocols, quality metrics, and best practice recommendations. A written personalized care plan for preventive services as well as general preventive health recommendations were provided to patient.     Lorrene Reid, LPN   16/05/9603   After Visit Summary: (MyChart) Due to this being a telephonic visit, the after visit summary with  patients personalized plan was offered to patient via MyChart   Nurse Notes: none

## 2023-07-05 ENCOUNTER — Encounter: Payer: 59 | Admitting: Plastic Surgery

## 2023-07-16 ENCOUNTER — Encounter: Payer: 59 | Admitting: Surgical

## 2023-07-17 ENCOUNTER — Ambulatory Visit: Payer: Self-pay | Admitting: Family Medicine

## 2023-07-17 NOTE — Telephone Encounter (Signed)
  Chief Complaint: Cough Symptoms: productive cough with yellow phlegm and runny nose Frequency: Since yesterday Pertinent Negatives: Patient denies CP, SOB Disposition: [] ED /[] Urgent Care (no appt availability in office) / [x] Appointment(In office/virtual)/ []  [place] [facility]/ [] Home Care/ [] Refused Recommended Disposition /[] [place] [facility]/ []  Follow-up with PCP Additional Notes: Patient called back reporting cough with yellow phlegm and runny nose. Patient states symptoms started yesterday. Reports generally not feeling well. Has been using OTC medication with some improvement. Requesting to be seen at PCP office. Appointment made for 07/18/2023 at 1:50 pm. Care advise given. Patient verbalizes understanding of plan. All questions answered.    Copied from CRM [id number]. Topic: Clinical - Red Word Triage >> Jul 17, 2023 12:55 PM Dennison Nancy wrote: Red Word that prompted transfer to Nurse Triage: pain in chest this morning , also experiencing tightness in chest , have cough and coughing up yellow phelm. Contact number # [phone] Reason for Disposition  SEVERE coughing spells (e.g., whooping sound after coughing, vomiting after coughing)  Answer Assessment - Initial Assessment Questions 1. ONSET: "When did the cough begin?"      07/16/2023 2. SEVERITY: "How bad is the cough today?"      Able to talk but does have coughing spells 3. SPUTUM: "Describe the color of your sputum" (none, dry cough; clear, white, yellow, green)     yellow 4. HEMOPTYSIS: "Are you coughing up any blood?" If so ask: "How much?" (flecks, streaks, tablespoons, etc.)     No blood 5. DIFFICULTY BREATHING: "Are you having difficulty breathing?" If Yes, ask: "How bad is it?" (e.g., mild, moderate, severe)    - MILD: No SOB at rest, mild SOB with walking, speaks normally in sentences, can lie down, no retractions, pulse < 100.    - MODERATE: SOB at rest, SOB with minimal exertion and prefers to sit, cannot lie  down flat, speaks in phrases, mild retractions, audible wheezing, pulse 100-120.    - SEVERE: Very SOB at rest, speaks in single words, struggling to breathe, sitting hunched forward, retractions, pulse > 120      Patient states "no difficulty breathing" 6. FEVER: "Do you have a fever?" If Yes, ask: "What is your temperature, how was it measured, and when did it start?"     No 7. CARDIAC HISTORY: "Do you have any history of heart disease?" (e.g., heart attack, congestive heart failure)      No 8. LUNG HISTORY: "Do you have any history of lung disease?"  (e.g., pulmonary embolus, asthma, emphysema)     Asthma 9. PE RISK FACTORS: "Do you have a history of blood clots?" (or: recent major surgery, recent prolonged travel, bedridden)     no 10. OTHER SYMPTOMS: "Do you have any other symptoms?" (e.g., runny nose, wheezing, chest pain)       Runny nose 12. TRAVEL: "Have you traveled out of the country in the last month?" (e.g., travel history, exposures)       No traveling  Protocols used: Cough - Acute Productive-A-AH

## 2023-07-18 ENCOUNTER — Encounter: Payer: Self-pay | Admitting: Nurse Practitioner

## 2023-07-18 ENCOUNTER — Ambulatory Visit (INDEPENDENT_AMBULATORY_CARE_PROVIDER_SITE_OTHER): Payer: Medicare Other | Admitting: Nurse Practitioner

## 2023-07-18 VITALS — BP 127/73 | HR 69 | Temp 97.6°F | Resp 20 | Ht 63.0 in | Wt 224.0 lb

## 2023-07-18 DIAGNOSIS — J40 Bronchitis, not specified as acute or chronic: Secondary | ICD-10-CM

## 2023-07-18 MED ORDER — AZITHROMYCIN 250 MG PO TABS: ORAL_TABLET | ORAL | 0 refills | Status: DC

## 2023-07-18 MED ORDER — PROMETHAZINE-DM 6.25-15 MG/5ML PO SYRP: 5.0000 mL | ORAL_SOLUTION | Freq: Four times a day (QID) | ORAL | 0 refills | Status: DC | PRN

## 2023-07-18 NOTE — Progress Notes (Signed)
Subjective:    Patient ID: Valerie Baker, female    DOB: 16-Jul-1956, 67 y.o.   MRN: [id number]   Chief Complaint: URI   URI  This is a new problem. The current episode started in the past 7 days. The problem has been gradually worsening. There has been no fever. Associated symptoms include congestion, coughing, headaches, rhinorrhea and a sore throat. Treatments tried: nyquil. The treatment provided mild relief.    Patient Active Problem List   Diagnosis Date Noted   Osteoarthritis of right knee 05/07/2022   Neck pain 03/15/2022   Back pain 03/15/2022   Symptomatic mammary hypertrophy 03/15/2022   Chronic pain syndrome 12/25/2021   Pure hypercholesterolemia 01/22/2020   Dysfunction of both eustachian tubes 04/06/2019   OA (osteoarthritis) of knee 12/30/2017   Depression, recurrent (HCC) 10/27/2017   Contact dermatitis and eczema due to plant 11/29/2016   Overactive bladder 11/29/2016   Frequent urination 01/23/2016   Urgency of urination 01/23/2016   Rash 01/23/2016   Urinary frequency 04/21/2015   Stress fracture of pelvis 11/03/2014   Disc degeneration, lumbosacral 11/03/2014   Osteopenia 11/03/2014   Severe obesity (BMI >= 40) (HCC) 11/03/2014   Essential hypertension 04/04/2009   Asthma 04/04/2009   MIGRAINES, HX OF 04/04/2009       Review of Systems  Constitutional:  Negative for chills and fever.  HENT:  Positive for congestion, rhinorrhea and sore throat.   Respiratory:  Positive for cough.   Neurological:  Positive for headaches.       Objective:   Physical Exam Constitutional:      Appearance: Normal appearance. She is obese.  HENT:     Right Ear: Tympanic membrane normal.     Left Ear: Tympanic membrane normal.     Nose: Congestion and rhinorrhea present.     Mouth/Throat:     Pharynx: No oropharyngeal exudate or posterior oropharyngeal erythema.  Cardiovascular:     Rate and Rhythm: Normal rate and regular rhythm.     Heart sounds: Normal  heart sounds.  Pulmonary:     Effort: Pulmonary effort is normal.     Breath sounds: Rhonchi present.     Comments: Course breath sounds Skin:    General: Skin is warm.  Neurological:     General: No focal deficit present.     Mental Status: She is alert and oriented to person, place, and time.  Psychiatric:        Mood and Affect: Mood normal.        Behavior: Behavior normal.    BP 127/73   Pulse 69   Temp 97.6 F (36.4 C) (Temporal)   Resp 20   Ht 5\' 3"  (1.6 m)   Wt 224 lb (101.6 kg)   SpO2 98%   BMI 39.68 kg/m         Assessment & Plan:  Valerie Baker in today with chief complaint of URI   1. Bronchitis 1. Take meds as prescribed 2. Use a cool mist humidifier especially during the winter months and when heat has been humid. 3. Use saline nose sprays frequently 4. Saline irrigations of the nose can be very helpful if done frequently.  * 4X daily for 1 week*  * Use of a nettie pot can be helpful with this. Follow directions with this* 5. Drink plenty of fluids 6. Keep thermostat turn down low 7.For any cough or congestion- 8. For fever or aces or pains- take tylenol or ibuprofen appropriate  for age and weight.  * for fevers greater than 101 orally you may alternate ibuprofen and tylenol every  3 hours.   Meds ordered this encounter  Medications   azithromycin (ZITHROMAX Z-PAK) 250 MG tablet    Sig: As directed    Dispense:  6 tablet    Refill:  0    Order Specific Question:   Supervising Provider    Answer:   Arville Care A [[id number]]   promethazine-dextromethorphan (PROMETHAZINE-DM) 6.25-15 MG/5ML syrup    Sig: Take 5 mLs by mouth 4 (four) times daily as needed for cough.    Dispense:  118 mL    Refill:  0    Order Specific Question:   Supervising Provider    Answer:   Arville Care A [[id number]]       The above assessment and management plan was discussed with the patient. The patient verbalized understanding of and has agreed to the  management plan. Patient is aware to call the clinic if symptoms persist or worsen. Patient is aware when to return to the clinic for a follow-up visit. Patient educated on when it is appropriate to go to the emergency department.   [name], FNP

## 2023-07-18 NOTE — Patient Instructions (Signed)
1. Take meds as prescribed 2. Use a cool mist humidifier especially during the winter months and when heat has been humid. 3. Use saline nose sprays frequently 4. Saline irrigations of the nose can be very helpful if done frequently.  * 4X daily for 1 week*  * Use of a nettie pot can be helpful with this. Follow directions with this* 5. Drink plenty of fluids 6. Keep thermostat turn down low 7.For any cough or congestion- promethazine dm 8. For fever or aces or pains- take tylenol or ibuprofen appropriate for age and weight.  * for fevers greater than 101 orally you may alternate ibuprofen and tylenol every  3 hours.

## 2023-07-19 ENCOUNTER — Other Ambulatory Visit: Payer: Self-pay

## 2023-07-19 DIAGNOSIS — J40 Bronchitis, not specified as acute or chronic: Secondary | ICD-10-CM

## 2023-07-19 MED ORDER — AZITHROMYCIN 250 MG PO TABS: ORAL_TABLET | ORAL | 0 refills | Status: DC

## 2023-07-19 NOTE — Telephone Encounter (Signed)
Medication has been sent in to correct pharmacy.

## 2023-07-19 NOTE — Telephone Encounter (Signed)
Copied from CRM [id number]. Topic: Clinical - Prescription Issue >> Jul 19, 2023 10:57 AM Orinda Kenner C wrote: Reason for CRM: Pt contact pharmacy this morning, and they do have antibiotics. Pt is feeling really sick and need rx today, pls [phone] and send rx to [id number] - [place], [place] - [address] [place] [address] Phone:[phone]

## 2023-07-19 NOTE — Telephone Encounter (Signed)
Copied from CRM [id number]. Topic: Clinical - Medication Refill >> Jul 19, 2023  2:39 PM Amy B wrote: Most Recent Primary Care Visit:  Provider: Lorrene Reid  Department: [facility]  Visit Type: ANNUAL WELL VISIT  Date: 07/01/2023  Medication:  azithromycin (ZITHROMAX Z-PAK) 250 MG tablet; promethazine-dextromethorphan (PROMETHAZINE-DM) 6.25-15 MG/5ML syrup  Has the patient contacted their pharmacy? Yes.  Patient states original Rx was sent to the wrong pharmacy.  It has been corrected in her chart.  Please resend.   (Agent: If no, request that the patient contact the pharmacy for the refill. If patient does not wish to contact the pharmacy document the reason why and proceed with request.) (Agent: If yes, when and what did the pharmacy advise?)  Is this the correct pharmacy for this prescription? Yes If no, delete pharmacy and type the correct one.  This is the patient's preferred pharmacy:  [id number] - [place], [place] - [address] [place] [address] Phone: [phone] Fax: [phone]  [facility]) - [place], [place] - [address] [facility] [address] [place] [place] [address] Phone: [phone] Fax: [phone]   Has the prescription been filled recently? No  Is the patient out of the medication? Yes  Has the patient been seen for an appointment in the last year OR does the patient have an upcoming appointment? Yes  Can we respond through MyChart? Yes  Agent: Please be advised that Rx refills may take up to 3 business days. We ask that you follow-up with your pharmacy.

## 2023-07-19 NOTE — Telephone Encounter (Signed)
Pt was seen in office yesterday 12/05 meds was sent   Copied from CRM [id number]. Topic: Clinical - Prescription Issue >> Jul 19, 2023 10:57 AM Orinda Kenner C wrote: Reason for CRM: Pt contact pharmacy this morning, and they do have antibiotics. Pt is feeling really sick and need rx today, pls [phone] and send rx to [id number] - [place], [place] - [address] [place] [address] Phone: [phone] Fax: [phone]

## 2023-07-23 ENCOUNTER — Telehealth: Payer: Self-pay | Admitting: Family Medicine

## 2023-07-23 DIAGNOSIS — J40 Bronchitis, not specified as acute or chronic: Secondary | ICD-10-CM

## 2023-07-23 MED ORDER — PROMETHAZINE-DM 6.25-15 MG/5ML PO SYRP: 5.0000 mL | ORAL_SOLUTION | Freq: Four times a day (QID) | ORAL | 0 refills | Status: DC | PRN

## 2023-07-23 NOTE — Telephone Encounter (Signed)
Copied from CRM [id number]. Topic: Clinical - Prescription Issue >> Jul 23, 2023  9:09 AM Conni Elliot wrote: Reason for CRM: pts promethazine-dextromethorphan (PROMETHAZINE-DM) 6.25-15 MG/5ML syrup rx was sent to wrong pharmacy, pt would like rx re sent to pharmacy on file, [id number] - [place], [place] - [address]

## 2023-07-30 ENCOUNTER — Encounter: Payer: 59 | Admitting: Surgical

## 2023-07-31 ENCOUNTER — Ambulatory Visit: Payer: Medicare Other | Admitting: Family Medicine

## 2023-08-01 IMAGING — MG MM DIGITAL SCREENING BILAT W/ TOMO AND CAD
6 of 12 series · 6 of 36 positions shown · non-contrast
Comparison: Previous exam(s).

CLINICAL DATA: Screening.

EXAM:
DIGITAL SCREENING BILATERAL MAMMOGRAM WITH TOMOSYNTHESIS AND CAD
TECHNIQUE: Bilateral screening digital craniocaudal and mediolateral oblique
mammograms were obtained. Bilateral screening digital breast
tomosynthesis was performed. The images were evaluated with
computer-aided detection.

[R CV synth-2D]
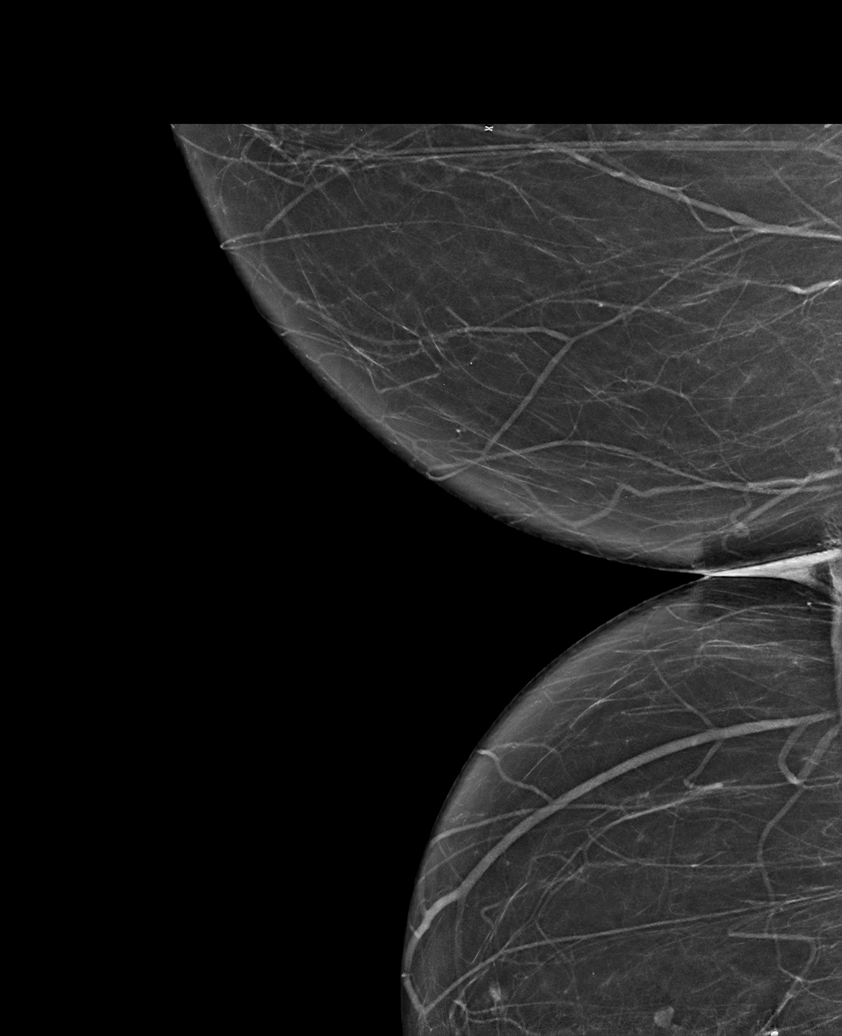

[R MLO synth-2D (1 of 2)]
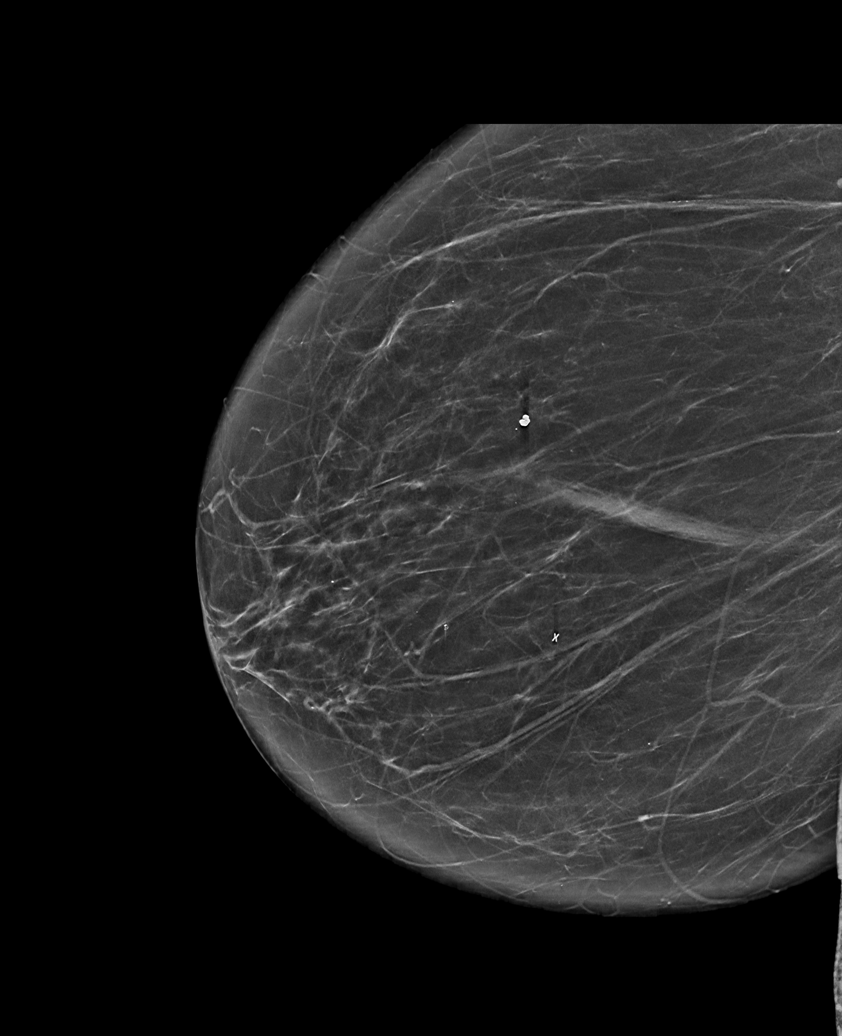

[R CC synth-2D]
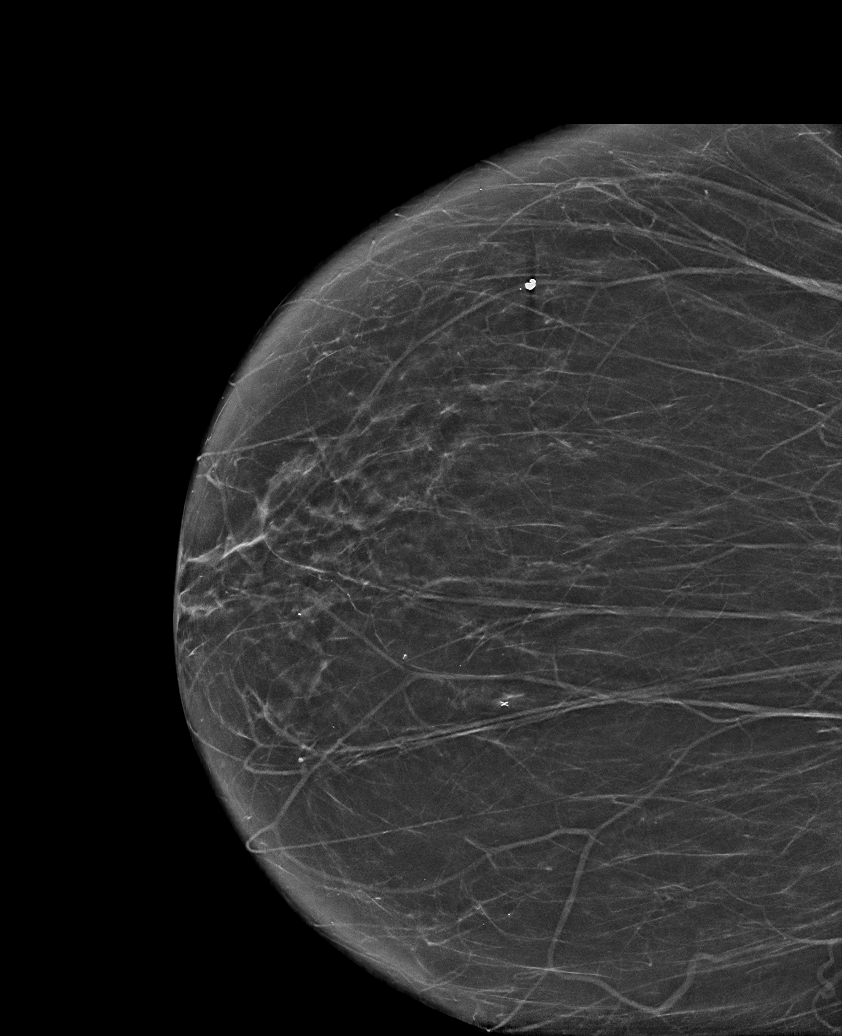

[L CC synth-2D]
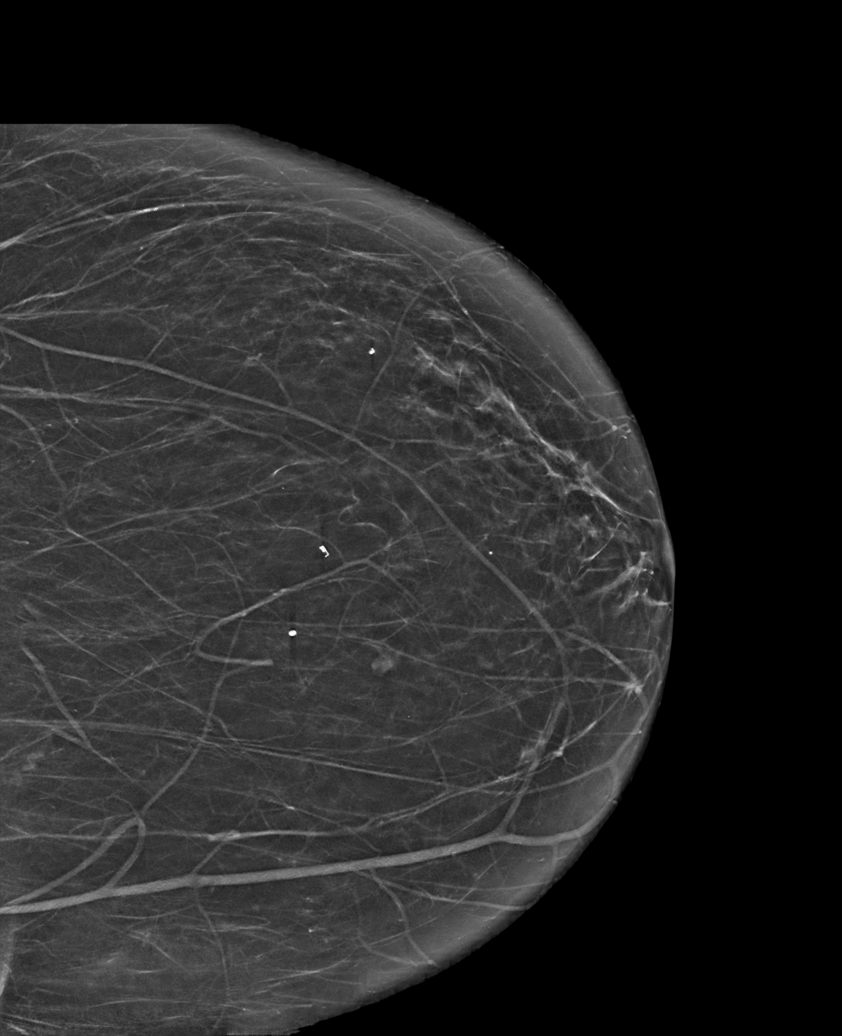

[R MLO synth-2D (2 of 2)]
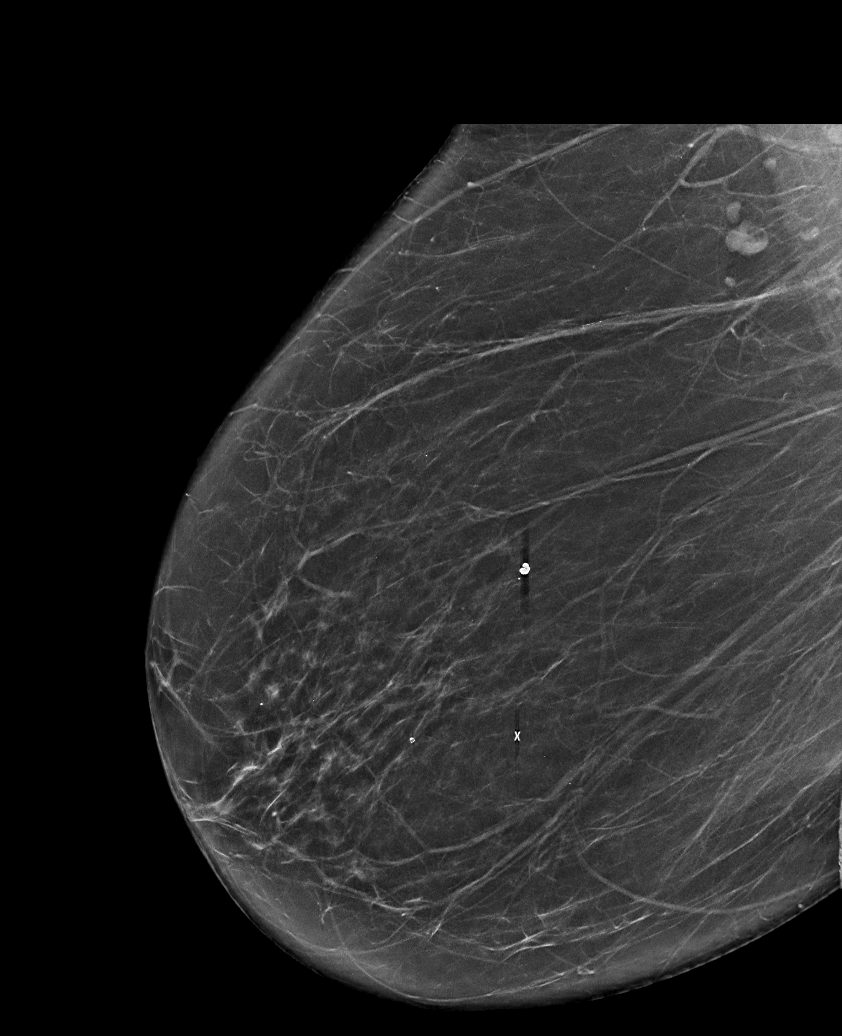

[L MLO synth-2D]
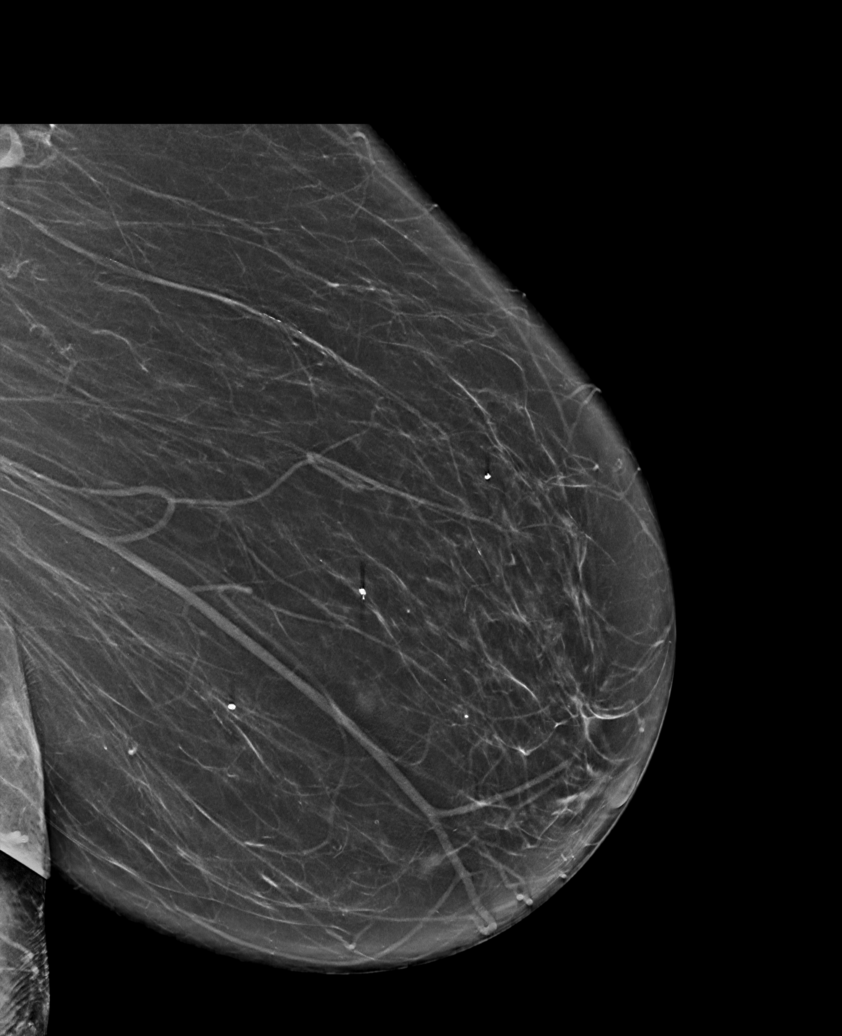

[6 of 36 positions shown; findings below may reference images not displayed]

ACR Breast Density Category b: There are scattered areas of
fibroglandular density.
FINDINGS: There are no findings suspicious for malignancy.
IMPRESSION: No mammographic evidence of malignancy. A result letter of this
screening mammogram will be mailed directly to the patient.

RECOMMENDATION:
Screening mammogram in one year. (Code:51-O-LD2)

BI-RADS CATEGORY  1: Negative.

## 2023-08-19 ENCOUNTER — Other Ambulatory Visit: Payer: Self-pay

## 2023-08-19 ENCOUNTER — Ambulatory Visit: Payer: Medicare Other | Admitting: Family Medicine

## 2023-08-19 DIAGNOSIS — R059 Cough, unspecified: Secondary | ICD-10-CM

## 2023-08-19 DIAGNOSIS — B9689 Other specified bacterial agents as the cause of diseases classified elsewhere: Secondary | ICD-10-CM

## 2023-08-19 MED ORDER — FLUTICASONE PROPIONATE 50 MCG/ACT NA SUSP: 2.0000 | Freq: Every day | NASAL | 2 refills | Status: DC

## 2023-08-21 ENCOUNTER — Ambulatory Visit (INDEPENDENT_AMBULATORY_CARE_PROVIDER_SITE_OTHER): Payer: Medicare Other

## 2023-08-21 ENCOUNTER — Ambulatory Visit (INDEPENDENT_AMBULATORY_CARE_PROVIDER_SITE_OTHER): Payer: Medicare Other | Admitting: Family Medicine

## 2023-08-21 ENCOUNTER — Encounter: Payer: Self-pay | Admitting: Family Medicine

## 2023-08-21 VITALS — BP 145/81 | HR 64 | Temp 97.9°F | Ht 63.0 in | Wt 224.0 lb

## 2023-08-21 DIAGNOSIS — M542 Cervicalgia: Secondary | ICD-10-CM | POA: Diagnosis not present

## 2023-08-21 DIAGNOSIS — J40 Bronchitis, not specified as acute or chronic: Secondary | ICD-10-CM

## 2023-08-21 DIAGNOSIS — F339 Major depressive disorder, recurrent, unspecified: Secondary | ICD-10-CM

## 2023-08-21 DIAGNOSIS — M4322 Fusion of spine, cervical region: Secondary | ICD-10-CM | POA: Diagnosis not present

## 2023-08-21 MED ORDER — BUPROPION HCL ER (SR) 150 MG PO TB12: 150.0000 mg | ORAL_TABLET | Freq: Every morning | ORAL | 3 refills | Status: DC

## 2023-08-21 MED ORDER — DULOXETINE HCL 30 MG PO CPEP: 30.0000 mg | ORAL_CAPSULE | Freq: Two times a day (BID) | ORAL | 3 refills | Status: DC

## 2023-08-21 MED ORDER — TRELEGY ELLIPTA 200-62.5-25 MCG/ACT IN AEPB: 1.0000 | INHALATION_SPRAY | Freq: Every day | RESPIRATORY_TRACT | Status: DC

## 2023-08-21 NOTE — Progress Notes (Signed)
 Subjective: CC: Bronchitis PCP: Jolinda Norene HERO, DO Valerie Baker is a 68 y.o. female presenting to clinic today for:  1.  Bronchitis Patient reports that she has been having recurrent bronchitis over the last year.  She has been seen multiple times for flareups.  She utilizes her albuterol  inhaler only if she needs for uncontrolled coughing.  Her last use was over 24 hours ago.  She reports no hemoptysis, unplanned weight loss, night sweats or chest pain.  She does get some irritation in the bronchial area when she is having these coughing episodes.  No fevers.  Her episode from back in December really did not seem to resolve after treatment.  She was given cough syrup and a Z-Pak during that visit.  She reports that she is a former smoker that quit over 20 years ago but has been told that she has asthma.  She is never seen a pulmonologist or had pulmonary function test done to her knowledge.  2.  Depression She would like to go up on the Cymbalta  but do it as a twice daily dosing rather than 60 mg as a daily dose.  She also would like to go back on a higher dose of Wellbutrin .  3.  Neck pain She reports that she was getting in her car recently and excellently smacked her head against the side of the car and this caused her neck to bend.  She has history of cervical spine surgery and notes some left-sided tightness.  This is despite home pain medications.  Seems to be worse with turning her neck to the right.  ROS: Per HPI  Allergies  Allergen Reactions   Latex Itching and Rash   Sulfonamide Derivatives Rash   Past Medical History:  Diagnosis Date   Adenomatous colon polyp 06/13/2006   Allergy    SINUS   Anxiety    Anxiety and depression    Arthritis    Asthma    Bladder incontinence    Blood transfusion    Cataract    Depression    Fibromyalgia    GERD (gastroesophageal reflux disease)    History of kidney stones    Hyperlipidemia    Kidney stones    Obesity     Osteopenia    Post-operative nausea and vomiting     Current Outpatient Medications:    albuterol  (VENTOLIN  HFA) 108 (90 Base) MCG/ACT inhaler, TAKE 2 PUFFS BY MOUTH EVERY 6 HOURS AS NEEDED FOR WHEEZE OR SHORTNESS OF BREATH, Disp: 18 each, Rfl: 2   ascorbic acid (VITAMIN C) 500 MG tablet, Take 500 mg by mouth daily., Disp: , Rfl:    azithromycin  (ZITHROMAX  Z-PAK) 250 MG tablet, As directed, Disp: 6 tablet, Rfl: 0   baclofen  (LIORESAL ) 10 MG tablet, TAKE 1/2 TO 1 TABLET BY  MOUTH 3 TIMES DAILY AS  NEEDED FOR MUSCLE SPASMS (Patient taking differently: Take 10 mg by mouth 3 (three) times daily.), Disp: 270 tablet, Rfl: 3   buPROPion  ER (WELLBUTRIN  SR) 100 MG 12 hr tablet, Take 1 tablet (100 mg total) by mouth in the morning., Disp: 100 tablet, Rfl: 3   Calcium Carbonate-Vitamin D  600-400 MG-UNIT tablet, Take 1 tablet by mouth 2 (two) times daily., Disp: , Rfl:    Cholecalciferol (VITAMIN D3) 2000 UNITS TABS, Take 2,000 Units by mouth 2 (two) times daily., Disp: , Rfl:    cycloSPORINE  (RESTASIS ) 0.05 % ophthalmic emulsion, Place 1 drop into both eyes 2 (two) times daily., Disp: , Rfl:  DULoxetine  (CYMBALTA ) 30 MG capsule, Take 1 capsule (30 mg total) by mouth daily., Disp: 90 capsule, Rfl: 3   fluocinonide  gel (LIDEX ) 0.05 %, Apply 0.5gm to affected areas as directed, Disp: 60 g, Rfl: 1   fluticasone  (FLONASE ) 50 MCG/ACT nasal spray, Place 2 sprays into both nostrils at bedtime., Disp: 16 g, Rfl: 2   gabapentin  (NEURONTIN ) 400 MG capsule, Take 2 capsules (800 mg total) by mouth 3 (three) times daily., Disp: 600 capsule, Rfl: 3   loratadine  (CLARITIN ) 10 MG tablet, Take 10 mg by mouth daily., Disp: , Rfl:    meloxicam  (MOBIC ) 15 MG tablet, Take 15 mg by mouth daily., Disp: , Rfl:    Multiple Vitamin (MULTIVITAMIN) tablet, Take 1 tablet by mouth daily., Disp: , Rfl:    omeprazole  (PRILOSEC) 40 MG capsule, Take 1 capsule (40 mg total) by mouth 2 (two) times daily., Disp: 200 capsule, Rfl: 0    ondansetron  (ZOFRAN -ODT) 4 MG disintegrating tablet, Take 1 tablet (4 mg total) by mouth every 8 (eight) hours as needed for nausea or vomiting., Disp: 20 tablet, Rfl: 0   OVER THE COUNTER MEDICATION, Fish Oil-Take 2 capsule by mouth daily., Disp: , Rfl:    OVER THE COUNTER MEDICATION, Zinc 50mg -Take 1 capsule by mouth daily., Disp: , Rfl:    oxybutynin  (DITROPAN ) 5 MG tablet, Take 1 tablet (5 mg total) by mouth 2 (two) times daily., Disp: 200 tablet, Rfl: 0   oxyCODONE -acetaminophen  (PERCOCET) 7.5-325 MG tablet, Take 1 tablet by mouth 2 (two) times daily as needed for severe pain., Disp: 60 tablet, Rfl: 0   oxyCODONE -acetaminophen  (PERCOCET) 7.5-325 MG tablet, Take 1 tablet by mouth 2 (two) times daily as needed for severe pain., Disp: 60 tablet, Rfl: 0   oxyCODONE -acetaminophen  (PERCOCET) 7.5-325 MG tablet, Take 1 tablet by mouth 2 (two) times daily as needed for severe pain., Disp: 60 tablet, Rfl: 0   Povidone, PF, (IVIZIA DRY EYES) 0.5 % SOLN, Place 1 drop into both eyes every evening., Disp: , Rfl:    promethazine -dextromethorphan (PROMETHAZINE -DM) 6.25-15 MG/5ML syrup, Take 5 mLs by mouth 4 (four) times daily as needed for cough., Disp: 118 mL, Rfl: 0   traZODone  (DESYREL ) 100 MG tablet, TAKE 1 TABLET BY MOUTH AT  BEDTIME AS NEEDED FOR SLEEP  /INSOMNIA, Disp: 100 tablet, Rfl: 3   zinc gluconate 50 MG tablet, Take 50 mg by mouth daily., Disp: , Rfl:  Social History   Socioeconomic History   Marital status: Married    Spouse name: Not on file   Number of children: 3   Years of education: Not on file   Highest education level: Not on file  Occupational History   Occupation: Disabled  Tobacco Use   Smoking status: Former    Current packs/day: 0.00    Average packs/day: 2.0 packs/day for 5.0 years (10.0 ttl pk-yrs)    Types: Cigarettes    Start date: 11/02/2000    Quit date: 11/02/2005    Years since quitting: 17.8   Smokeless tobacco: Never  Vaping Use   Vaping status: Never Used   Substance and Sexual Activity   Alcohol  use: No   Drug use: No   Sexual activity: Yes  Other Topics Concern   Not on file  Social History Narrative   Not on file   Social Drivers of Health   Financial Resource Strain: Low Risk  (07/01/2023)   Overall Financial Resource Strain (CARDIA)    Difficulty of Paying Living Expenses: Not hard at  all  Food Insecurity: No Food Insecurity (07/01/2023)   Hunger Vital Sign    Worried About Running Out of Food in the Last Year: Never true    Ran Out of Food in the Last Year: Never true  Transportation Needs: No Transportation Needs (07/01/2023)   PRAPARE - Administrator, Civil Service (Medical): No    Lack of Transportation (Non-Medical): No  Physical Activity: Insufficiently Active (07/01/2023)   Exercise Vital Sign    Days of Exercise per Week: 2 days    Minutes of Exercise per Session: 20 min  Stress: No Stress Concern Present (07/01/2023)   [employer] of Occupational Health - Occupational Stress Questionnaire    Feeling of Stress : Not at all  Social Connections: Moderately Integrated (07/01/2023)   Social Connection and Isolation Panel [NHANES]    Frequency of Communication with Friends and Family: More than three times a week    Frequency of Social Gatherings with Friends and Family: More than three times a week    Attends Religious Services: More than 4 times per year    Active Member of [employer] or Organizations: No    Attends Banker Meetings: Never    Marital Status: Married  Catering Manager Violence: Not At Risk (07/01/2023)   Humiliation, Afraid, Rape, and Kick questionnaire    Fear of Current or Ex-Partner: No    Emotionally Abused: No    Physically Abused: No    Sexually Abused: No   Family History  Problem Relation Age of Onset   Heart disease Mother    Diabetes Mother    Melanoma Mother        nose   Stroke Father    Colon polyps Sister    Hypertension Sister    Skin cancer Sister     Diabetes Brother    Cirrhosis Brother    Hypertension Brother    Colon polyps Brother    Hypertension Brother    Stomach cancer Other    Rectal cancer Other    Esophageal cancer Other    Colon cancer Other     Objective: Office vital signs reviewed. BP (!) 145/81   Pulse 64   Temp 97.9 F (36.6 C)   Ht 5' 3 (1.6 m)   Wt 224 lb (101.6 kg)   SpO2 95%   BMI 39.68 kg/m   Physical Examination:  General: Awake, alert, morbidly obese, No acute distress HEENT: Sclera white.  Moist mucous membranes Cardio: regular rate and rhythm, S1S2 heard, no murmurs appreciated Pulm: Globally decreased breath sounds but generally clear to auscultation bilaterally, no wheezes, rhonchi or rales; normal work of breathing on room air.  She does have intermittent coughing episodes. MSK: Ambulating independently.  She does have restricted range of motion to the right with rotation.     08/21/2023    1:19 PM 07/01/2023   11:38 AM 03/29/2023    9:47 AM  Depression screen PHQ 2/9  Decreased Interest 0 0 0  Down, Depressed, Hopeless 0 0 0  PHQ - 2 Score 0 0 0  Altered sleeping   0  Tired, decreased energy   0  Change in appetite   0  Feeling bad or failure about yourself    0  Trouble concentrating   0  Moving slowly or fidgety/restless   0  Suicidal thoughts   0  PHQ-9 Score   0  Difficult doing work/chores   Not difficult at all  08/21/2023    1:19 PM 03/29/2023    9:48 AM 10/02/2022    9:38 AM 06/15/2022    8:26 AM  GAD 7 : Generalized Anxiety Score  Nervous, Anxious, on Edge 0 0 0 0  Control/stop worrying 0 0 0 0  Worry too much - different things 0 0 0 0  Trouble relaxing 0 0 0 0  Restless 0 0 0 0  Easily annoyed or irritable 0 0 0 0  Afraid - awful might happen 0 0 0 0  Total GAD 7 Score 0 0 0 0  Anxiety Difficulty  Not difficult at all Not difficult at all Not difficult at all   Assessment/ Plan: 68 y.o. female   Bronchitis - Plan: DG Chest 2 View,  Fluticasone -Umeclidin-Vilant (TRELEGY ELLIPTA ) 200-62.5-25 MCG/ACT AEPB  Neck pain - Plan: DG Cervical Spine 2 or 3 views  Depression, recurrent (HCC) - Plan: DULoxetine  (CYMBALTA ) 30 MG capsule, buPROPion  (WELLBUTRIN  SR) 150 MG 12 hr tablet  I am going to trial her on Trelegy.  I only had samples of 200 mcg in office so we will trial her on this 1.  We discussed gust how to utilize and she was able to successfully demonstrate how to inhale the dry powder today.  Obtain plain films of the lungs and this showed no appreciable pulmonary infiltrates to suggest infection at this time.  I do question if perhaps she simply has some untreated underlying lung disease.  I anticipate we will refer to pulmonology if she is not having a good response to the Trelegy.  We discussed potential for pulmonary function tests etc.  She will contact me in 1 week to let me know how the Trelegy is going and we will make a plan pending that result  Plain films of the C-spine were collected given reports of recent injury.  I can certainly appreciate the hardware there but do not see any gross displacement of the hardware.  I have advanced both the Cymbalta  and Wellbutrin  per her request.  Would like to see her back in the next 3 to 4 months, sooner if concerns arise   Norene CHRISTELLA Fielding, DO [facility] [phone]

## 2023-08-22 ENCOUNTER — Telehealth: Payer: Self-pay | Admitting: Family Medicine

## 2023-08-22 ENCOUNTER — Other Ambulatory Visit: Payer: Self-pay

## 2023-08-22 DIAGNOSIS — F339 Major depressive disorder, recurrent, unspecified: Secondary | ICD-10-CM

## 2023-08-22 MED ORDER — BUPROPION HCL ER (SR) 150 MG PO TB12: 150.0000 mg | ORAL_TABLET | Freq: Every morning | ORAL | 0 refills | Status: DC

## 2023-08-22 MED ORDER — DULOXETINE HCL 30 MG PO CPEP: 30.0000 mg | ORAL_CAPSULE | Freq: Two times a day (BID) | ORAL | 0 refills | Status: DC

## 2023-08-22 NOTE — Telephone Encounter (Signed)
Medication has been fixed

## 2023-08-22 NOTE — Telephone Encounter (Signed)
 Copied from CRM [id number]. Topic: Clinical - Medication Refill >> Aug 21, 2023  5:33 PM Laurier BROCKS wrote: Most Recent Primary Care Visit:  Provider: JOLINDA NORENE HERO  Department: [facility]  Visit Type: ACUTE  Date: 08/21/2023  Medication: DULoxetine  (CYMBALTA ) 30 MG capsule *PATIENT NEEDS 3 MONTH SUPPLY IN ORDER FOR IT TO BE COVERED** buPROPion  (WELLBUTRIN  SR) 150 MG 12 hr tablet    Has the patient contacted their pharmacy? Yes (Agent: If no, request that the patient contact the pharmacy for the refill. If patient does not wish to contact the pharmacy document the reason why and proceed with request.) (Agent: If yes, when and what did the pharmacy advise?)  Is this the correct pharmacy for this prescription? Yes If no, delete pharmacy and type the correct one.  This is the patient's preferred pharmacy:    [facility]) - [place], [place] - [address] [facility] [address] [place] [address] [place] [address] Phone: [phone] Fax: [phone]   Has the prescription been filled recently? Yes  Is the patient out of the medication? Yes  Has the patient been seen for an appointment in the last year OR does the patient have an upcoming appointment? Yes  Can we respond through MyChart? Yes  Agent: Please be advised that Rx refills may take up to 3 business days. We ask that you follow-up with your pharmacy.

## 2023-08-26 ENCOUNTER — Other Ambulatory Visit: Payer: Self-pay

## 2023-08-26 DIAGNOSIS — K219 Gastro-esophageal reflux disease without esophagitis: Secondary | ICD-10-CM

## 2023-08-26 MED ORDER — OMEPRAZOLE 40 MG PO CPDR: 40.0000 mg | DELAYED_RELEASE_CAPSULE | Freq: Two times a day (BID) | ORAL | 0 refills | Status: DC

## 2023-08-30 ENCOUNTER — Other Ambulatory Visit: Payer: Self-pay | Admitting: Family Medicine

## 2023-08-30 DIAGNOSIS — F339 Major depressive disorder, recurrent, unspecified: Secondary | ICD-10-CM

## 2023-09-05 ENCOUNTER — Other Ambulatory Visit: Payer: Self-pay | Admitting: Family Medicine

## 2023-09-05 DIAGNOSIS — R350 Frequency of micturition: Secondary | ICD-10-CM

## 2023-09-10 ENCOUNTER — Ambulatory Visit (INDEPENDENT_AMBULATORY_CARE_PROVIDER_SITE_OTHER): Payer: Medicare Other | Admitting: Family Medicine

## 2023-09-10 ENCOUNTER — Encounter: Payer: Self-pay | Admitting: Family Medicine

## 2023-09-10 ENCOUNTER — Ambulatory Visit: Payer: Medicare Other

## 2023-09-10 VITALS — BP 139/80 | HR 66 | Temp 98.5°F | Ht 63.0 in | Wt 223.0 lb

## 2023-09-10 DIAGNOSIS — M51372 Other intervertebral disc degeneration, lumbosacral region with discogenic back pain and lower extremity pain: Secondary | ICD-10-CM | POA: Diagnosis not present

## 2023-09-10 DIAGNOSIS — F339 Major depressive disorder, recurrent, unspecified: Secondary | ICD-10-CM | POA: Diagnosis not present

## 2023-09-10 DIAGNOSIS — J069 Acute upper respiratory infection, unspecified: Secondary | ICD-10-CM

## 2023-09-10 DIAGNOSIS — R350 Frequency of micturition: Secondary | ICD-10-CM | POA: Diagnosis not present

## 2023-09-10 DIAGNOSIS — K219 Gastro-esophageal reflux disease without esophagitis: Secondary | ICD-10-CM

## 2023-09-10 MED ORDER — OXYBUTYNIN CHLORIDE 5 MG PO TABS: 5.0000 mg | ORAL_TABLET | Freq: Two times a day (BID) | ORAL | 3 refills | Status: DC

## 2023-09-10 MED ORDER — OMEPRAZOLE 40 MG PO CPDR: 40.0000 mg | DELAYED_RELEASE_CAPSULE | Freq: Two times a day (BID) | ORAL | 3 refills | Status: DC

## 2023-09-10 MED ORDER — MELOXICAM 15 MG PO TABS: 7.5000 mg | ORAL_TABLET | Freq: Every day | ORAL | 3 refills | Status: DC | PRN

## 2023-09-10 MED ORDER — DULOXETINE HCL 30 MG PO CPEP: 30.0000 mg | ORAL_CAPSULE | Freq: Two times a day (BID) | ORAL | 3 refills | Status: DC

## 2023-09-10 MED ORDER — BACLOFEN 10 MG PO TABS: 5.0000 mg | ORAL_TABLET | Freq: Three times a day (TID) | ORAL | 3 refills | Status: DC | PRN

## 2023-09-10 MED ORDER — GABAPENTIN 400 MG PO CAPS: 800.0000 mg | ORAL_CAPSULE | Freq: Three times a day (TID) | ORAL | 3 refills | Status: DC

## 2023-09-10 MED ORDER — BUPROPION HCL ER (SR) 150 MG PO TB12: 150.0000 mg | ORAL_TABLET | Freq: Every morning | ORAL | 3 refills | Status: DC

## 2023-09-10 NOTE — Patient Instructions (Addendum)
You are on the highest dose of the Oxybutin for the bladder already  It appears that you have a viral upper respiratory infection (cold).  Cold symptoms can last up to 2 weeks.  I recommend that you only use cold medications that are safe in high blood pressure like Coricidin (generic is fine).  Other cold medications can increase your blood pressure.    - Get plenty of rest and drink plenty of fluids. - Try to breathe moist air. Use a cold mist humidifier. - Consume warm fluids (soup or tea) to provide relief for a stuffy nose and to loosen phlegm. - For nasal stuffiness, try saline nasal spray or a Neti Pot.  Afrin nasal spray can also be used but this product should not be used longer than 3 days or it will cause rebound nasal stuffiness (worsening nasal congestion). - For sore throat pain relief: use chloraseptic spray, suck on throat lozenges, hard candy or popsicles; gargle with warm salt water (1/4 tsp. salt per 8 oz. of water); and eat soft, bland foods. - Eat a well-balanced diet. If you cannot, ensure you are getting enough nutrients by taking a daily multivitamin. - Avoid dairy products, as they can thicken phlegm. - Avoid alcohol, as it impairs your body's immune system.  CONTACT YOUR DOCTOR IF YOU EXPERIENCE ANY OF THE FOLLOWING: - High fever - Ear pain - Sinus-type headache - Unusually severe cold symptoms - Cough that gets worse while other cold symptoms improve - Flare up of any chronic lung problem, such as asthma - Your symptoms persist longer than 2 weeks

## 2023-09-10 NOTE — Progress Notes (Signed)
Subjective: CC: Med refills PCP: Raliegh Ip, DO Valerie Baker is a 68 y.o. female presenting to clinic today for:  1.  Chronic back pain Compliant with meds as prescribed by her pain management.  Needs refills on her gabapentin, meloxicam, Cymbalta and baclofen.  2.  Anxiety and depression She reports stability with Cymbalta and Wellbutrin  3.  Overactive bladder She reports that the Ditropan 5 mg still is not totally controlling her bladder and she wants know she go up on dose   ROS: Per HPI  Allergies  Allergen Reactions   Latex Itching and Rash   Sulfonamide Derivatives Rash   Past Medical History:  Diagnosis Date   Adenomatous colon polyp 06/13/2006   Allergy    SINUS   Anxiety    Anxiety and depression    Arthritis    Asthma    Bladder incontinence    Blood transfusion    Cataract    Depression    Fibromyalgia    GERD (gastroesophageal reflux disease)    History of kidney stones    Hyperlipidemia    Kidney stones    Obesity    Osteopenia    Post-operative nausea and vomiting     Current Outpatient Medications:    albuterol (VENTOLIN HFA) 108 (90 Base) MCG/ACT inhaler, TAKE 2 PUFFS BY MOUTH EVERY 6 HOURS AS NEEDED FOR WHEEZE OR SHORTNESS OF BREATH, Disp: 18 each, Rfl: 2   ascorbic acid (VITAMIN C) 500 MG tablet, Take 500 mg by mouth daily., Disp: , Rfl:    Calcium Carbonate-Vitamin D 600-400 MG-UNIT tablet, Take 1 tablet by mouth 2 (two) times daily., Disp: , Rfl:    Cholecalciferol (VITAMIN D3) 2000 UNITS TABS, Take 2,000 Units by mouth 2 (two) times daily., Disp: , Rfl:    cycloSPORINE (RESTASIS) 0.05 % ophthalmic emulsion, Place 1 drop into both eyes 2 (two) times daily., Disp: , Rfl:    fluocinonide gel (LIDEX) 0.05 %, Apply 0.5gm to affected areas as directed, Disp: 60 g, Rfl: 1   fluticasone (FLONASE) 50 MCG/ACT nasal spray, Place 2 sprays into both nostrils at bedtime., Disp: 16 g, Rfl: 2   loratadine (CLARITIN) 10 MG tablet, Take  10 mg by mouth daily., Disp: , Rfl:    meloxicam (MOBIC) 15 MG tablet, Take 15 mg by mouth daily., Disp: , Rfl:    Multiple Vitamin (MULTIVITAMIN) tablet, Take 1 tablet by mouth daily., Disp: , Rfl:    ondansetron (ZOFRAN-ODT) 4 MG disintegrating tablet, Take 1 tablet (4 mg total) by mouth every 8 (eight) hours as needed for nausea or vomiting., Disp: 20 tablet, Rfl: 0   OVER THE COUNTER MEDICATION, Fish Oil-Take 2 capsule by mouth daily., Disp: , Rfl:    OVER THE COUNTER MEDICATION, Zinc 50mg -Take 1 capsule by mouth daily., Disp: , Rfl:    oxyCODONE-acetaminophen (PERCOCET) 7.5-325 MG tablet, Take 1 tablet by mouth 2 (two) times daily as needed for severe pain., Disp: 60 tablet, Rfl: 0   oxyCODONE-acetaminophen (PERCOCET) 7.5-325 MG tablet, Take 1 tablet by mouth 2 (two) times daily as needed for severe pain., Disp: 60 tablet, Rfl: 0   oxyCODONE-acetaminophen (PERCOCET) 7.5-325 MG tablet, Take 1 tablet by mouth 2 (two) times daily as needed for severe pain., Disp: 60 tablet, Rfl: 0   Povidone, PF, (IVIZIA DRY EYES) 0.5 % SOLN, Place 1 drop into both eyes every evening., Disp: , Rfl:    traZODone (DESYREL) 100 MG tablet, TAKE 1 TABLET BY MOUTH AT  BEDTIME AS NEEDED FOR SLEEP  /INSOMNIA, Disp: 100 tablet, Rfl: 3   zinc gluconate 50 MG tablet, Take 50 mg by mouth daily., Disp: , Rfl:    baclofen (LIORESAL) 10 MG tablet, Take 0.5-1 tablets (5-10 mg total) by mouth 3 (three) times daily as needed for muscle spasms., Disp: 270 tablet, Rfl: 3   buPROPion (WELLBUTRIN SR) 150 MG 12 hr tablet, Take 1 tablet (150 mg total) by mouth in the morning., Disp: 100 tablet, Rfl: 3   DULoxetine (CYMBALTA) 30 MG capsule, Take 1 capsule (30 mg total) by mouth 2 (two) times daily., Disp: 200 capsule, Rfl: 3   gabapentin (NEURONTIN) 400 MG capsule, Take 2 capsules (800 mg total) by mouth 3 (three) times daily., Disp: 600 capsule, Rfl: 3   omeprazole (PRILOSEC) 40 MG capsule, Take 1 capsule (40 mg total) by mouth 2 (two)  times daily., Disp: 200 capsule, Rfl: 3   oxybutynin (DITROPAN) 5 MG tablet, Take 1 tablet (5 mg total) by mouth 2 (two) times daily., Disp: 200 tablet, Rfl: 3 Social History   Socioeconomic History   Marital status: Married    Spouse name: Not on file   Number of children: 3   Years of education: Not on file   Highest education level: Not on file  Occupational History   Occupation: Disabled  Tobacco Use   Smoking status: Former    Current packs/day: 0.00    Average packs/day: 2.0 packs/day for 5.0 years (10.0 ttl pk-yrs)    Types: Cigarettes    Start date: 11/02/2000    Quit date: 11/02/2005    Years since quitting: 17.8   Smokeless tobacco: Never  Vaping Use   Vaping status: Never Used  Substance and Sexual Activity   Alcohol use: No   Drug use: No   Sexual activity: Yes  Other Topics Concern   Not on file  Social History Narrative   Not on file   Social Drivers of Health   Financial Resource Strain: Low Risk  (07/01/2023)   Overall Financial Resource Strain (CARDIA)    Difficulty of Paying Living Expenses: Not hard at all  Food Insecurity: No Food Insecurity (07/01/2023)   Hunger Vital Sign    Worried About Running Out of Food in the Last Year: Never true    Ran Out of Food in the Last Year: Never true  Transportation Needs: No Transportation Needs (07/01/2023)   PRAPARE - Administrator, Civil Service (Medical): No    Lack of Transportation (Non-Medical): No  Physical Activity: Insufficiently Active (07/01/2023)   Exercise Vital Sign    Days of Exercise per Week: 2 days    Minutes of Exercise per Session: 20 min  Stress: No Stress Concern Present (07/01/2023)   [employer] of Occupational Health - Occupational Stress Questionnaire    Feeling of Stress : Not at all  Social Connections: Moderately Integrated (07/01/2023)   Social Connection and Isolation Panel [NHANES]    Frequency of Communication with Friends and Family: More than three times a  week    Frequency of Social Gatherings with Friends and Family: More than three times a week    Attends Religious Services: More than 4 times per year    Active Member of [employer] or Organizations: No    Attends Banker Meetings: Never    Marital Status: Married  Catering manager Violence: Not At Risk (07/01/2023)   Humiliation, Afraid, Rape, and Kick questionnaire    Fear of Current  or Ex-Partner: No    Emotionally Abused: No    Physically Abused: No    Sexually Abused: No   Family History  Problem Relation Age of Onset   Heart disease Mother    Diabetes Mother    Melanoma Mother        nose   Stroke Father    Colon polyps Sister    Hypertension Sister    Skin cancer Sister    Diabetes Brother    Cirrhosis Brother    Hypertension Brother    Colon polyps Brother    Hypertension Brother    Stomach cancer Other    Rectal cancer Other    Esophageal cancer Other    Colon cancer Other     Objective: Office vital signs reviewed. BP 139/80   Pulse 66   Temp 98.5 F (36.9 C)   Ht 5\' 3"  (1.6 m)   Wt 223 lb (101.2 kg)   SpO2 95%   BMI 39.50 kg/m   Physical Examination:  General: Awake, alert, morbidly obese, No acute distress HEENT: sclera white, MMM Cardio: regular rate and rhythm, S1S2 heard, no murmurs appreciated Pulm: clear to auscultation bilaterally, no wheezes, rhonchi or rales; normal work of breathing on room air MSK: Ambulating independently with slightly antalgic gait and station.  Assessment/ Plan: 68 y.o. female   Viral URI  Gastroesophageal reflux disease without esophagitis - Plan: omeprazole (PRILOSEC) 40 MG capsule  Urinary frequency - Plan: oxybutynin (DITROPAN) 5 MG tablet  Depression, recurrent (HCC) - Plan: buPROPion (WELLBUTRIN SR) 150 MG 12 hr tablet, DULoxetine (CYMBALTA) 30 MG capsule  Degeneration of intervertebral disc of lumbosacral region with discogenic back pain and lower extremity pain - Plan: baclofen (LIORESAL) 10 MG  tablet, gabapentin (NEURONTIN) 400 MG capsule, meloxicam (MOBIC) 15 MG tablet  Supportive care recommended.  No evidence of secondary bacterial infection.  Pulmonary exam unremarkable.  She did not tolerate Trelegy.  I did offer pulmonology referral today but she declined.  PPI renewed.  GERD chronic and stable.  Wellbutrin and duloxetine renewed.  Back pain is slightly exacerbated but continues to see pain management.  Gabapentin, baclofen renewed.  Use sparingly  Follow-up as scheduled.   Raliegh Ip, DO [facility] [phone]

## 2023-09-17 DIAGNOSIS — M791 Myalgia, unspecified site: Secondary | ICD-10-CM | POA: Diagnosis not present

## 2023-09-17 DIAGNOSIS — M47812 Spondylosis without myelopathy or radiculopathy, cervical region: Secondary | ICD-10-CM | POA: Diagnosis not present

## 2023-10-03 ENCOUNTER — Telehealth: Payer: Self-pay

## 2023-10-03 DIAGNOSIS — J40 Bronchitis, not specified as acute or chronic: Secondary | ICD-10-CM

## 2023-10-03 DIAGNOSIS — R059 Cough, unspecified: Secondary | ICD-10-CM

## 2023-10-03 DIAGNOSIS — B9689 Other specified bacterial agents as the cause of diseases classified elsewhere: Secondary | ICD-10-CM

## 2023-10-03 DIAGNOSIS — R350 Frequency of micturition: Secondary | ICD-10-CM

## 2023-10-03 MED ORDER — FLUTICASONE PROPIONATE 50 MCG/ACT NA SUSP: 2.0000 | Freq: Every day | NASAL | 0 refills | Status: DC

## 2023-10-03 MED ORDER — OXYBUTYNIN CHLORIDE 5 MG PO TABS
5.0000 mg | ORAL_TABLET | Freq: Two times a day (BID) | ORAL | 0 refills | Status: DC
Start: 2023-10-03 — End: 2023-12-11

## 2023-10-03 MED ORDER — ALBUTEROL SULFATE HFA 108 (90 BASE) MCG/ACT IN AERS: 2.0000 | INHALATION_SPRAY | Freq: Four times a day (QID) | RESPIRATORY_TRACT | 0 refills | Status: DC | PRN

## 2023-10-03 NOTE — Telephone Encounter (Signed)
Copied from CRM [id number]. Topic: General - Other >> Oct 03, 2023  1:07 PM Antwanette L wrote: Reason for CRM: Patient is calling to let us know that [facility] will be reaching to get a refill order on fluticasone (FLONASE) 50 MCG/ACT nasal spray, albuterol (VENTOLIN HFA) 108 (90 Base) MCG/ACT inhaler, and  oxybutynin (DITROPAN) 5 MG tablet. Patient can be contacted by phone at [phone] if additional information is needed.

## 2023-10-03 NOTE — Addendum Note (Signed)
Addended by: Julious Payer D on: 10/03/2023 04:47 PM   Modules accepted: Orders

## 2023-10-03 NOTE — Telephone Encounter (Signed)
 FYI

## 2023-11-04 ENCOUNTER — Encounter: Payer: Self-pay | Admitting: Nurse Practitioner

## 2023-11-04 ENCOUNTER — Ambulatory Visit (INDEPENDENT_AMBULATORY_CARE_PROVIDER_SITE_OTHER): Admitting: Nurse Practitioner

## 2023-11-04 VITALS — BP 135/81 | HR 65 | Temp 97.7°F | Ht 63.0 in | Wt 223.6 lb

## 2023-11-04 DIAGNOSIS — R051 Acute cough: Secondary | ICD-10-CM | POA: Diagnosis not present

## 2023-11-04 MED ORDER — AZITHROMYCIN 250 MG PO TABS: ORAL_TABLET | ORAL | 0 refills | Status: DC

## 2023-11-04 MED ORDER — GUAIFENESIN 400 MG PO TABS: 400.0000 mg | ORAL_TABLET | Freq: Four times a day (QID) | ORAL | 0 refills | Status: DC | PRN

## 2023-11-04 NOTE — Progress Notes (Signed)
 Acute Office Visit  Subjective:     Patient ID: Valerie Baker, female    DOB: [birth year], 68 y.o.   MRN: [id number]  Chief Complaint  Patient presents with   Cough    Cough started 2 weeks    Neck Pain    Neck pain/head pain started yesterday when she woke up got worse throughout the day    Nasal Congestion    HPI Valerie Baker is a 68 yrs old female present 11/04/2023 for an acute visit c/o of cough and neck pain Cough: Patient complains of headache  , nasal congestion, and productive cough with sputum described as yellow.  Symptoms began 2 weeks ago.  The cough is productive of green/yellow sputum and is aggravated by nothing Associated symptoms include:sputum production. Patient does not have new pets. Patient does have a history of asthma. Patient does have a history of environmental allergens. Patient does not have recent travel.  Neck Pain She reports new onset neck pain. There was not an injury that may have caused the pain. The most recent episode started this morning and is staying constant. The pain is located in the back of neck over the cervical spinewithout radiation. It is described as aching, is moderate and 3/10 in intensity, occurring intermittently. Symptoms are worse in the: woke up with   Aggravating factors: movement Relieving factors: none.  She has tried prescription pain relievers with moderate relief.   Associated symptoms: No chest pain No fever  No headaches No joint pains  No numbness in  No sore throat  No swallowing problems No tingling in   No weakness in     ---------------------------------------------------------------------------------------  Active Ambulatory Problems    Diagnosis Date Noted   Essential hypertension 04/04/2009   Asthma 04/04/2009   MIGRAINES, HX OF 04/04/2009   Stress fracture of pelvis 11/03/2014   Disc degeneration, lumbosacral 11/03/2014   Osteopenia 11/03/2014   Severe obesity (BMI >= 40) (HCC) 11/03/2014    Urinary frequency 04/21/2015   Frequent urination 01/23/2016   Urgency of urination 01/23/2016   Rash 01/23/2016   Contact dermatitis and eczema due to plant 11/29/2016   Overactive bladder 11/29/2016   Depression, recurrent (HCC) 10/27/2017   OA (osteoarthritis) of knee 12/30/2017   Dysfunction of both eustachian tubes 04/06/2019   Pure hypercholesterolemia 01/22/2020   Chronic pain syndrome 12/25/2021   Neck pain 03/15/2022   Back pain 03/15/2022   Symptomatic mammary hypertrophy 03/15/2022   Osteoarthritis of right knee 05/07/2022   Resolved Ambulatory Problems    Diagnosis Date Noted   No Resolved Ambulatory Problems   Past Medical History:  Diagnosis Date   Adenomatous colon polyp 06/13/2006   Allergy    Anxiety    Anxiety and depression    Arthritis    Asthma    Bladder incontinence    Blood transfusion    Cataract    Depression    Fibromyalgia    GERD (gastroesophageal reflux disease)    History of kidney stones    Hyperlipidemia    Kidney stones    Obesity    Post-operative nausea and vomiting     Review of Systems  Constitutional:  Negative for chills and fever.  Respiratory:  Positive for cough and sputum production.        Yellow for 2 weeks  Cardiovascular:  Negative for chest pain and leg swelling.  Gastrointestinal:  Negative for nausea and vomiting.  Musculoskeletal:  Positive for neck pain.  Skin:  Negative for itching.  Neurological:  Negative for dizziness and headaches.  Psychiatric/Behavioral:  Negative for suicidal ideas. The patient does not have insomnia.    Negative unless indicated in HPI    Objective:    BP 135/81   Pulse 65   Temp 97.7 F (36.5 C) (Temporal)   Ht 5\' 3"  (1.6 m)   Wt 223 lb 9.6 oz (101.4 kg)   SpO2 95%   BMI 39.61 kg/m  BP Readings from Last 3 Encounters:  11/04/23 135/81  09/10/23 139/80  08/21/23 (!) 145/81   Wt Readings from Last 3 Encounters:  11/04/23 223 lb 9.6 oz (101.4 kg)  09/10/23 223 lb (101.2  kg)  08/21/23 224 lb (101.6 kg)      Physical Exam Vitals and nursing note reviewed.  Constitutional:      General: She is not in acute distress. HENT:     Head: Normocephalic and atraumatic.     Right Ear: Tympanic membrane, ear canal and external ear normal. There is no impacted cerumen.     Left Ear: Tympanic membrane and ear canal normal. There is no impacted cerumen.     Nose: Nose normal.     Mouth/Throat:     Mouth: Mucous membranes are moist.     Pharynx: No oropharyngeal exudate.  Eyes:     General: No scleral icterus.    Extraocular Movements: Extraocular movements intact.     Conjunctiva/sclera: Conjunctivae normal.     Pupils: Pupils are equal, round, and reactive to light.  Cardiovascular:     Heart sounds: Normal heart sounds.  Pulmonary:     Effort: Pulmonary effort is normal.     Breath sounds: Normal breath sounds. No wheezing.  Musculoskeletal:        General: Normal range of motion.     Right lower leg: No edema.     Left lower leg: No edema.  Skin:    General: Skin is warm and dry.     Capillary Refill: Capillary refill takes less than 2 seconds.     Findings: No rash.  Neurological:     Mental Status: She is alert and oriented to person, place, and time. Mental status is at baseline.  Psychiatric:        Mood and Affect: Mood normal.        Behavior: Behavior normal.        Thought Content: Thought content normal.        Judgment: Judgment normal.     No results found for any visits on 11/04/23.      Assessment & Plan:  Acute cough -     Azithromycin; 2-tabs on the first and 1-tab daily until done  Dispense: 6 each; Refill: 0 -     guaiFENesin; Take 1 tablet (400 mg total) by mouth every 6 (six) hours as needed.  Dispense: 30 tablet; Refill: 0  Valerie Baker is 26 yrs caucasian female seen today for cough, no acute distress Cough: Z-pack # 6 dispensed, guaifenesin TID for Neck Pain: RAM, use already prescribed pain medication Encourage healthy  lifestyle choices, including diet (rich in fruits, vegetables, and lean proteins, and low in salt and simple carbohydrates) and exercise (at least 30 minutes of moderate physical activity daily).     The above assessment and management plan was discussed with the patient. The patient verbalized understanding of and has agreed to the management plan. Patient is aware to call the clinic if they develop any new symptoms or  if symptoms persist or worsen. Patient is aware when to return to the clinic for a follow-up visit. Patient educated on when it is appropriate to go to the emergency department.  Return if symptoms worsen or fail to improve.  Arrie Aran Santa Lighter, [facility] [address] [place], [place] [address] [phone]  Note: This document was prepared by [employer] voice dictation technology and any errors that results from this process are unintentional.

## 2023-11-18 ENCOUNTER — Ambulatory Visit: Payer: Medicare Other | Admitting: Family Medicine

## 2023-11-18 ENCOUNTER — Encounter: Payer: Self-pay | Admitting: Family Medicine

## 2023-11-18 ENCOUNTER — Ambulatory Visit (INDEPENDENT_AMBULATORY_CARE_PROVIDER_SITE_OTHER): Payer: Medicare Other

## 2023-11-18 VITALS — BP 123/76 | HR 66 | Temp 98.5°F | Ht 63.0 in | Wt 229.0 lb

## 2023-11-18 DIAGNOSIS — R7309 Other abnormal glucose: Secondary | ICD-10-CM | POA: Diagnosis not present

## 2023-11-18 DIAGNOSIS — Z780 Asymptomatic menopausal state: Secondary | ICD-10-CM | POA: Diagnosis not present

## 2023-11-18 DIAGNOSIS — E7800 Pure hypercholesterolemia, unspecified: Secondary | ICD-10-CM | POA: Diagnosis not present

## 2023-11-18 DIAGNOSIS — K5909 Other constipation: Secondary | ICD-10-CM

## 2023-11-18 DIAGNOSIS — Z0000 Encounter for general adult medical examination without abnormal findings: Secondary | ICD-10-CM

## 2023-11-18 DIAGNOSIS — J301 Allergic rhinitis due to pollen: Secondary | ICD-10-CM

## 2023-11-18 DIAGNOSIS — Z0001 Encounter for general adult medical examination with abnormal findings: Secondary | ICD-10-CM | POA: Diagnosis not present

## 2023-11-18 DIAGNOSIS — M8589 Other specified disorders of bone density and structure, multiple sites: Secondary | ICD-10-CM | POA: Diagnosis not present

## 2023-11-18 DIAGNOSIS — J40 Bronchitis, not specified as acute or chronic: Secondary | ICD-10-CM | POA: Diagnosis not present

## 2023-11-18 DIAGNOSIS — K219 Gastro-esophageal reflux disease without esophagitis: Secondary | ICD-10-CM

## 2023-11-18 LAB — BAYER DCA HB A1C WAIVED: HB A1C (BAYER DCA - WAIVED): 5.3 % (ref 4.8–5.6)

## 2023-11-18 LAB — LIPID PANEL

## 2023-11-18 MED ORDER — FLUTICASONE PROPIONATE 50 MCG/ACT NA SUSP: 2.0000 | Freq: Every day | NASAL | 0 refills | Status: DC

## 2023-11-18 MED ORDER — DESLORATADINE 5 MG PO TABS: 5.0000 mg | ORAL_TABLET | Freq: Every day | ORAL | 3 refills | Status: DC

## 2023-11-18 MED ORDER — ALBUTEROL SULFATE HFA 108 (90 BASE) MCG/ACT IN AERS: 2.0000 | INHALATION_SPRAY | Freq: Four times a day (QID) | RESPIRATORY_TRACT | 0 refills | Status: DC | PRN

## 2023-11-18 NOTE — Progress Notes (Signed)
 Valerie Baker is a 68 y.o. female presents to office today for annual physical exam examination.    Concerns today include: 1.  Constipation She reports she has been having difficulty with constipation.  This is refractory to stool softeners, OTC liquid constipation medication.  She admits that she does not drink sufficient water because she does not want to urinate frequently.  She is on opioid chronically but does not take this regularly  2.  Allergic rhinitis She reports that she has been having drainage and feels like she gets some postnasal drainage that causes her the need to cough.  She ran out of Claritin about a week ago.  Has Flonase but needs refills on Ventolin.  Has tried controller medication in the past but it was too expensive for her to afford  3. Skin lesion She reports a skin lesion along the right medial ankle that appears to be different than it used to be.  She has an appointment with Dr. Yetta Barre, her dermatologist, in may   Occupation: Retired, Marital status: Married, Substance use: None Health Maintenance Due  Topic Date Due   COVID-19 Vaccine (3 - Moderna risk series) 04/13/2020   DEXA SCAN  12/28/2022   Refills needed today: Albuterol  Immunization History  Administered Date(s) Administered   Fluad Quad(high Dose 65+) 05/30/2021, 06/18/2022   Fluad Trivalent(High Dose 65+) 05/29/2023   H1N1 08/26/2008   Influenza,inj,Quad PF,6+ Mos 06/01/2015, 05/21/2016, 06/24/2017, 06/17/2018, 06/10/2019, 06/16/2020   Influenza,inj,quad, With Preservative 05/14/2019   Influenza-Unspecified 05/13/2013, 05/26/2014   Moderna Sars-Covid-2 Vaccination 02/17/2020, 03/16/2020   Novel Infuenza-h1n1-09 08/26/2008   PNEUMOCOCCAL CONJUGATE-20 08/21/2021   Tdap 11/04/2014   Zoster Recombinant(Shingrix) 08/21/2021, 11/21/2021   Past Medical History:  Diagnosis Date   Adenomatous colon polyp 06/13/2006   Allergy    SINUS   Anxiety    Anxiety and depression    Arthritis     Asthma    Bladder incontinence    Blood transfusion    Cataract    Depression    Fibromyalgia    GERD (gastroesophageal reflux disease)    History of kidney stones    Hyperlipidemia    Kidney stones    Obesity    Osteopenia    Post-operative nausea and vomiting    Stress fracture of pelvis 11/03/2014   Social History   Socioeconomic History   Marital status: Married    Spouse name: Not on file   Number of children: 3   Years of education: Not on file   Highest education level: Not on file  Occupational History   Occupation: Disabled  Tobacco Use   Smoking status: Former    Current packs/day: 0.00    Average packs/day: 2.0 packs/day for 5.0 years (10.0 ttl pk-yrs)    Types: Cigarettes    Start date: 11/02/2000    Quit date: 11/02/2005    Years since quitting: 18.0   Smokeless tobacco: Never  Vaping Use   Vaping status: Never Used  Substance and Sexual Activity   Alcohol use: No   Drug use: No   Sexual activity: Yes  Other Topics Concern   Not on file  Social History Narrative   Not on file   Social Drivers of Health   Financial Resource Strain: Low Risk  (07/01/2023)   Overall Financial Resource Strain (CARDIA)    Difficulty of Paying Living Expenses: Not hard at all  Food Insecurity: No Food Insecurity (07/01/2023)   Hunger Vital Sign    Worried  About Running Out of Food in the Last Year: Never true    Ran Out of Food in the Last Year: Never true  Transportation Needs: No Transportation Needs (07/01/2023)   PRAPARE - Administrator, Civil Service (Medical): No    Lack of Transportation (Non-Medical): No  Physical Activity: Insufficiently Active (07/01/2023)   Exercise Vital Sign    Days of Exercise per Week: 2 days    Minutes of Exercise per Session: 20 min  Stress: No Stress Concern Present (07/01/2023)   [employer] of Occupational Health - Occupational Stress Questionnaire    Feeling of Stress : Not at all  Social Connections:  Moderately Integrated (07/01/2023)   Social Connection and Isolation Panel [NHANES]    Frequency of Communication with Friends and Family: More than three times a week    Frequency of Social Gatherings with Friends and Family: More than three times a week    Attends Religious Services: More than 4 times per year    Active Member of [employer] or Organizations: No    Attends Banker Meetings: Never    Marital Status: Married  Catering manager Violence: Not At Risk (07/01/2023)   Humiliation, Afraid, Rape, and Kick questionnaire    Fear of Current or Ex-Partner: No    Emotionally Abused: No    Physically Abused: No    Sexually Abused: No   Past Surgical History:  Procedure Laterality Date   ABDOMINAL HYSTERECTOMY  1992   partial in 1994   APPENDECTOMY  1975   BACK SURGERY  2002   2002-2022 multi  x 6  fusions and rods   BREAST BIOPSY Bilateral 11/26/2017   FIBROCYSTIC CHANGES WITH CALCIFICATIONS    CERVICAL SPINE SURGERY  2014   with rod   COLONOSCOPY  06/12/2011   Russella Dar, 03/17/21 MS   ELBOW SURGERY  2003   EYE SURGERY Bilateral    cataract   FOOT SURGERY  1994   right   KIDNEY STONE SURGERY  2004   KNEE ARTHROSCOPY Left 2008   LUMBAR FUSION     2002,2003,2007,2008,2010, 2022 with fusion and rods   NASAL SINUS SURGERY  2003   NISSEN FUNDOPLICATION  2004   ROTATOR CUFF REPAIR Bilateral 8413,2440   SPINAL CORD STIMULATOR IMPLANT  2005   SPINAL CORD STIMULATOR IMPLANT  2004   SPINAL CORD STIMULATOR REMOVAL  2008   TOTAL KNEE ARTHROPLASTY Left 2008,2009   TOTAL KNEE ARTHROPLASTY Right 05/07/2022   Procedure: TOTAL KNEE ARTHROPLASTY;  Surgeon: Ollen Gross, MD;  Location: [facility];  Service: Orthopedics;  Laterality: Right;   Family History  Problem Relation Age of Onset   Heart disease Mother    Diabetes Mother    Melanoma Mother        nose   Stroke Father    Colon polyps Sister    Hypertension Sister    Skin cancer Sister    Diabetes Brother    Cirrhosis  Brother    Hypertension Brother    Colon polyps Brother    Hypertension Brother    Stomach cancer Other    Rectal cancer Other    Esophageal cancer Other    Colon cancer Other     Current Outpatient Medications:    albuterol (VENTOLIN HFA) 108 (90 Base) MCG/ACT inhaler, Inhale 2 puffs into the lungs every 6 (six) hours as needed for wheezing or shortness of breath., Disp: 20.1 g, Rfl: 0   ascorbic acid (VITAMIN C) 500 MG tablet,  Take 500 mg by mouth daily., Disp: , Rfl:    baclofen (LIORESAL) 10 MG tablet, Take 0.5-1 tablets (5-10 mg total) by mouth 3 (three) times daily as needed for muscle spasms., Disp: 270 tablet, Rfl: 3   buPROPion (WELLBUTRIN SR) 150 MG 12 hr tablet, Take 1 tablet (150 mg total) by mouth in the morning., Disp: 100 tablet, Rfl: 3   Calcium Carbonate-Vitamin D 600-400 MG-UNIT tablet, Take 1 tablet by mouth 2 (two) times daily., Disp: , Rfl:    Cholecalciferol (VITAMIN D3) 2000 UNITS TABS, Take 2,000 Units by mouth 2 (two) times daily., Disp: , Rfl:    cycloSPORINE (RESTASIS) 0.05 % ophthalmic emulsion, Place 1 drop into both eyes 2 (two) times daily., Disp: , Rfl:    DULoxetine (CYMBALTA) 30 MG capsule, Take 1 capsule (30 mg total) by mouth 2 (two) times daily., Disp: 200 capsule, Rfl: 3   fluocinonide gel (LIDEX) 0.05 %, Apply 0.5gm to affected areas as directed, Disp: 60 g, Rfl: 1   fluticasone (FLONASE) 50 MCG/ACT nasal spray, Place 2 sprays into both nostrils at bedtime., Disp: 48 g, Rfl: 0   gabapentin (NEURONTIN) 400 MG capsule, Take 2 capsules (800 mg total) by mouth 3 (three) times daily., Disp: 600 capsule, Rfl: 3   guaifenesin (HUMIBID E) 400 MG TABS tablet, Take 1 tablet (400 mg total) by mouth every 6 (six) hours as needed., Disp: 30 tablet, Rfl: 0   loratadine (CLARITIN) 10 MG tablet, Take 10 mg by mouth daily., Disp: , Rfl:    meloxicam (MOBIC) 15 MG tablet, Take 0.5-1 tablets (7.5-15 mg total) by mouth daily as needed for pain., Disp: 100 tablet, Rfl: 3    Multiple Vitamin (MULTIVITAMIN) tablet, Take 1 tablet by mouth daily., Disp: , Rfl:    omeprazole (PRILOSEC) 40 MG capsule, Take 1 capsule (40 mg total) by mouth 2 (two) times daily., Disp: 200 capsule, Rfl: 3   ondansetron (ZOFRAN-ODT) 4 MG disintegrating tablet, Take 1 tablet (4 mg total) by mouth every 8 (eight) hours as needed for nausea or vomiting., Disp: 20 tablet, Rfl: 0   OVER THE COUNTER MEDICATION, Fish Oil-Take 2 capsule by mouth daily., Disp: , Rfl:    OVER THE COUNTER MEDICATION, Zinc 50mg -Take 1 capsule by mouth daily., Disp: , Rfl:    oxybutynin (DITROPAN) 5 MG tablet, Take 1 tablet (5 mg total) by mouth 2 (two) times daily., Disp: 200 tablet, Rfl: 0   oxyCODONE-acetaminophen (PERCOCET) 7.5-325 MG tablet, Take 1 tablet by mouth 2 (two) times daily as needed for severe pain., Disp: 60 tablet, Rfl: 0   Povidone, PF, (IVIZIA DRY EYES) 0.5 % SOLN, Place 1 drop into both eyes every evening., Disp: , Rfl:    traZODone (DESYREL) 100 MG tablet, TAKE 1 TABLET BY MOUTH AT  BEDTIME AS NEEDED FOR SLEEP  /INSOMNIA, Disp: 100 tablet, Rfl: 3   zinc gluconate 50 MG tablet, Take 50 mg by mouth daily., Disp: , Rfl:   Allergies  Allergen Reactions   Latex Itching and Rash   Sulfonamide Derivatives Rash     ROS: Review of Systems Pertinent items noted in HPI and remainder of comprehensive ROS otherwise negative.    Physical exam BP 123/76   Pulse 66   Temp 98.5 F (36.9 C)   Ht 5\' 3"  (1.6 m)   Wt 229 lb (103.9 kg)   SpO2 96%   BMI 40.57 kg/m  General appearance: alert, cooperative, appears stated age, and morbidly obese Head: Normocephalic, without obvious  abnormality, atraumatic Eyes: negative findings: lids and lashes normal, conjunctivae and sclerae normal, corneas clear, and pupils equal, round, reactive to light and accomodation Ears: normal TM's and external ear canals both ears Nose: Nares normal. Septum midline. Mucosa normal. No drainage or sinus tenderness. Throat: lips,  mucosa, and tongue normal; teeth and gums normal Neck: no adenopathy, supple, symmetrical, trachea midline, and thyroid not enlarged, symmetric, no tenderness/mass/nodules Back:  Increase kyphosis of the thoracic spine and C-spine.  She is unable to extend the C-spine Lungs: clear to auscultation bilaterally Heart: regular rate and rhythm, S1, S2 normal, no murmur, click, rub or gallop Abdomen: soft, non-tender; bowel sounds normal; no masses,  no organomegaly and obese Extremities: extremities normal, atraumatic, no cyanosis or edema Pulses: 2+ and symmetric Skin:  She has about a nickel sized rounded flat plaque-like lesion along the right medial lower ankle with what appears to be central hyperpigmentation and peripheral halo Lymph nodes: Cervical, supraclavicular, and axillary nodes normal. Neurologic: Grossly normal      11/18/2023   12:53 PM 09/10/2023    2:28 PM 08/21/2023    1:19 PM  Depression screen PHQ 2/9  Decreased Interest 0 0 0  Down, Depressed, Hopeless 0 0 0  PHQ - 2 Score 0 0 0  Altered sleeping 0 0   Tired, decreased energy 0 0   Change in appetite 0 0   Feeling bad or failure about yourself  0 0   Trouble concentrating 0 0   Moving slowly or fidgety/restless 0 0   Suicidal thoughts 0 0   PHQ-9 Score 0 0   Difficult doing work/chores Not difficult at all Not difficult at all       11/18/2023   12:53 PM 09/10/2023    2:28 PM 08/21/2023    1:19 PM 03/29/2023    9:48 AM  GAD 7 : Generalized Anxiety Score  Nervous, Anxious, on Edge 0 0 0 0  Control/stop worrying 0 0 0 0  Worry too much - different things 0 0 0 0  Trouble relaxing 0 0 0 0  Restless 0 0 0 0  Easily annoyed or irritable 0 0 0 0  Afraid - awful might happen 0 0 0 0  Total GAD 7 Score 0 0 0 0  Anxiety Difficulty Not difficult at all Not difficult at all  Not difficult at all     Assessment/ Plan: Raeanne Gathers here for annual physical exam.   Annual physical exam  Gastroesophageal reflux  disease without esophagitis - Plan: CBC  Pure hypercholesterolemia - Plan: CMP14+EGFR, Lipid Panel  Osteopenia of multiple sites - Plan: CMP14+EGFR, VITAMIN D 25 Hydroxy (Vit-D Deficiency, Fractures)  Severe obesity (BMI >= 40) (HCC) - Plan: CMP14+EGFR, Lipid Panel, Bayer DCA Hb A1c Waived, VITAMIN D 25 Hydroxy (Vit-D Deficiency, Fractures)  Bronchitis - Plan: albuterol (VENTOLIN HFA) 108 (90 Base) MCG/ACT inhaler  Seasonal allergic rhinitis due to pollen - Plan: desloratadine (CLARINEX) 5 MG tablet, fluticasone (FLONASE) 50 MCG/ACT nasal spray  Chronic constipation  Nonfasting labs collected today.  Meds renewed.  I have changed her Claritin to Clarinex in efforts to improve her allergy symptoms.  Discussed treatment for chronic constipation including increasing water, fiber in the diet.  We can consider trial of Linzess at some point if symptomology is not improving.  Though I had no samples of this today  She will update her DEXA scan today given history of osteopenia  Keep follow-up with dermatology for further evaluation of  the lesion on the right lower ankle  Counseled on healthy lifestyle choices, including diet (rich in fruits, vegetables and lean meats and low in salt and simple carbohydrates) and exercise (at least 30 minutes of moderate physical activity daily).  Patient to follow up 1 year  Javonni Macke M. Nadine Counts, DO

## 2023-11-19 ENCOUNTER — Encounter: Payer: Self-pay | Admitting: Family Medicine

## 2023-11-19 DIAGNOSIS — Z780 Asymptomatic menopausal state: Secondary | ICD-10-CM | POA: Diagnosis not present

## 2023-11-19 DIAGNOSIS — M8589 Other specified disorders of bone density and structure, multiple sites: Secondary | ICD-10-CM | POA: Diagnosis not present

## 2023-11-19 LAB — CMP14+EGFR
ALT: 30 IU/L (ref 0–32)
AST: 33 IU/L (ref 0–40)
Albumin: 3.9 g/dL (ref 3.9–4.9)
Alkaline Phosphatase: 135 IU/L — ABNORMAL HIGH (ref 44–121)
BUN/Creatinine Ratio: 17 (ref 12–28)
BUN: 11 mg/dL (ref 8–27)
Bilirubin Total: 0.2 mg/dL (ref 0.0–1.2)
CO2: 25 mmol/L (ref 20–29)
Calcium: 9.1 mg/dL (ref 8.7–10.3)
Chloride: 102 mmol/L (ref 96–106)
Creatinine, Ser: 0.66 mg/dL (ref 0.57–1.00)
Globulin, Total: 1.9 g/dL (ref 1.5–4.5)
Glucose: 79 mg/dL (ref 70–99)
Potassium: 4.2 mmol/L (ref 3.5–5.2)
Sodium: 138 mmol/L (ref 134–144)
Total Protein: 5.8 g/dL — ABNORMAL LOW (ref 6.0–8.5)
eGFR: 96 mL/min/{1.73_m2} (ref >59)

## 2023-11-19 LAB — CBC
Hematocrit: 34.4 % (ref 34.0–46.6)
Hemoglobin: 11 g/dL — ABNORMAL LOW (ref 11.1–15.9)
MCH: 29.2 pg (ref 26.6–33.0)
MCHC: 32 g/dL (ref 31.5–35.7)
MCV: 91 fL (ref 79–97)
Platelets: 238 10*3/uL (ref 150–450)
RBC: 3.77 x10E6/uL (ref 3.77–5.28)
RDW: 13.9 % (ref 11.7–15.4)
WBC: 7 10*3/uL (ref 3.4–10.8)

## 2023-11-19 LAB — VITAMIN D 25 HYDROXY (VIT D DEFICIENCY, FRACTURES): Vit D, 25-Hydroxy: 63.9 ng/mL (ref 30.0–100.0)

## 2023-11-19 LAB — LIPID PANEL
Cholesterol, Total: 222 mg/dL — ABNORMAL HIGH (ref 100–199)
HDL: 77 mg/dL (ref >39)
LDL CALC COMMENT:: 2.9 ratio (ref 0.0–4.4)
LDL Chol Calc (NIH): 128 mg/dL — ABNORMAL HIGH (ref 0–99)
Triglycerides: 99 mg/dL (ref 0–149)
VLDL Cholesterol Cal: 17 mg/dL (ref 5–40)

## 2023-11-22 ENCOUNTER — Encounter: Payer: Self-pay | Admitting: Family Medicine

## 2023-11-22 MED ORDER — DESLORATADINE 5 MG PO TABS: 5.0000 mg | ORAL_TABLET | Freq: Every day | ORAL | 3 refills | Status: AC

## 2023-11-22 MED ORDER — FLUTICASONE PROPIONATE 50 MCG/ACT NA SUSP: 2.0000 | Freq: Every day | NASAL | 0 refills | Status: DC

## 2023-11-22 NOTE — Addendum Note (Signed)
 Addended by: Hessie Diener on: 11/22/2023 12:48 PM   Modules accepted: Orders

## 2023-12-11 ENCOUNTER — Other Ambulatory Visit: Payer: Self-pay | Admitting: Family Medicine

## 2023-12-11 DIAGNOSIS — R350 Frequency of micturition: Secondary | ICD-10-CM

## 2023-12-12 DIAGNOSIS — M25512 Pain in left shoulder: Secondary | ICD-10-CM | POA: Diagnosis not present

## 2023-12-12 DIAGNOSIS — M25511 Pain in right shoulder: Secondary | ICD-10-CM | POA: Diagnosis not present

## 2023-12-18 ENCOUNTER — Encounter (HOSPITAL_BASED_OUTPATIENT_CLINIC_OR_DEPARTMENT_OTHER): Payer: Self-pay | Admitting: Emergency Medicine

## 2023-12-18 ENCOUNTER — Other Ambulatory Visit: Payer: Self-pay

## 2023-12-18 ENCOUNTER — Emergency Department (HOSPITAL_BASED_OUTPATIENT_CLINIC_OR_DEPARTMENT_OTHER)

## 2023-12-18 ENCOUNTER — Emergency Department (HOSPITAL_BASED_OUTPATIENT_CLINIC_OR_DEPARTMENT_OTHER)
Admission: EM | Admit: 2023-12-18 | Discharge: 2023-12-18 | Disposition: A | Discharge status: Home / Self Care | Attending: Emergency Medicine | Admitting: Emergency Medicine

## 2023-12-18 DIAGNOSIS — W01198A Fall on same level from slipping, tripping and stumbling with subsequent striking against other object, initial encounter: Secondary | ICD-10-CM | POA: Insufficient documentation

## 2023-12-18 DIAGNOSIS — R9082 White matter disease, unspecified: Secondary | ICD-10-CM | POA: Insufficient documentation

## 2023-12-18 DIAGNOSIS — R519 Headache, unspecified: Secondary | ICD-10-CM | POA: Diagnosis not present

## 2023-12-18 DIAGNOSIS — Z23 Encounter for immunization: Secondary | ICD-10-CM | POA: Insufficient documentation

## 2023-12-18 DIAGNOSIS — S0011XA Contusion of right eyelid and periocular area, initial encounter: Secondary | ICD-10-CM | POA: Insufficient documentation

## 2023-12-18 DIAGNOSIS — M542 Cervicalgia: Secondary | ICD-10-CM | POA: Insufficient documentation

## 2023-12-18 DIAGNOSIS — S0083XA Contusion of other part of head, initial encounter: Secondary | ICD-10-CM | POA: Diagnosis not present

## 2023-12-18 DIAGNOSIS — I10 Essential (primary) hypertension: Secondary | ICD-10-CM | POA: Diagnosis not present

## 2023-12-18 DIAGNOSIS — S0993XA Unspecified injury of face, initial encounter: Secondary | ICD-10-CM | POA: Diagnosis not present

## 2023-12-18 DIAGNOSIS — Z91040 Latex allergy status: Secondary | ICD-10-CM | POA: Insufficient documentation

## 2023-12-18 DIAGNOSIS — S12500A Unspecified displaced fracture of sixth cervical vertebra, initial encounter for closed fracture: Secondary | ICD-10-CM | POA: Diagnosis not present

## 2023-12-18 DIAGNOSIS — S12400A Unspecified displaced fracture of fifth cervical vertebra, initial encounter for closed fracture: Secondary | ICD-10-CM | POA: Diagnosis not present

## 2023-12-18 DIAGNOSIS — R220 Localized swelling, mass and lump, head: Secondary | ICD-10-CM | POA: Diagnosis not present

## 2023-12-18 DIAGNOSIS — W19XXXA Unspecified fall, initial encounter: Secondary | ICD-10-CM

## 2023-12-18 MED ORDER — TETANUS-DIPHTH-ACELL PERTUSSIS 5-2.5-18.5 LF-MCG/0.5 IM SUSY
0.5000 mL | PREFILLED_SYRINGE | Freq: Once | INTRAMUSCULAR | Status: AC
  Administered 2023-12-18: 0.5 mL via INTRAMUSCULAR
  Filled 2023-12-18: qty 0.5

## 2023-12-18 MED ORDER — LIDOCAINE 5 % EX PTCH
1.0000 | MEDICATED_PATCH | CUTANEOUS | Status: DC
  Administered 2023-12-18: 1 via TRANSDERMAL
  Filled 2023-12-18: qty 1

## 2023-12-18 NOTE — Discharge Instructions (Addendum)
 We saw you in the ER after you had a fall. All the imaging results are normal, no fractures seen. No evidence of brain bleed. Please be very careful with walking, and do everything possible to prevent falls. Please follow up with your primary care provider in the next week to be re-evaluated and ensure you are improving. If symptoms change or worsen, please return to ER.

## 2023-12-18 NOTE — ED Triage Notes (Signed)
 Mechanical fall last week. Struck head over concrete floor. Denies LOC. Bruising to face. No deficits. States increasingly worse headaches since falls.   Head and neck pain.

## 2023-12-18 NOTE — ED Provider Notes (Signed)
 [place] EMERGENCY DEPARTMENT AT [facility] Provider Note   CSN: [id number] Arrival date & time: 12/18/23  1405     History  Chief Complaint  Patient presents with   Valerie Baker is a 68 y.o. female history of hypertension, migraines, chronic pain presented with facial bruising and headache after having a mechanical fall last week.  Patient went outside to go to her garden which a squirrel caused her to fall face forward.  Patient is not on any blood thinners and denies LOC.  Patient is taking Tylenol  and been icing her head and neck along with heat and states she does feel slightly better but still has a minor headache.  Patient denies vision changes, new onset weakness, gait changes, chest pain or shortness of breath, back pain that is new, hip pain, abdominal pain.  Patient is unsure when her last tetanus was.    Home Medications Prior to Admission medications   Medication Sig Start Date End Date Taking? Authorizing Provider  albuterol  (VENTOLIN  HFA) 108 (90 Base) MCG/ACT inhaler Inhale 2 puffs into the lungs every 6 (six) hours as needed for wheezing or shortness of breath. 11/18/23   Eliodoro Guerin, DO  ascorbic acid (VITAMIN C) 500 MG tablet Take 500 mg by mouth daily.    [provider]  baclofen  (LIORESAL ) 10 MG tablet Take 0.5-1 tablets (5-10 mg total) by mouth 3 (three) times daily as needed for muscle spasms. 09/10/23   Eliodoro Guerin, DO  buPROPion  (WELLBUTRIN  SR) 150 MG 12 hr tablet Take 1 tablet (150 mg total) by mouth in the morning. 09/10/23   Eliodoro Guerin, DO  Calcium Carbonate-Vitamin D  600-400 MG-UNIT tablet Take 1 tablet by mouth 2 (two) times daily.    [provider]  Cholecalciferol (VITAMIN D3) 2000 UNITS TABS Take 2,000 Units by mouth 2 (two) times daily.    [provider]  cycloSPORINE  (RESTASIS ) 0.05 % ophthalmic emulsion Place 1 drop into both eyes 2 (two) times daily.    [provider]  desloratadine  (CLARINEX ) 5 MG tablet Take 1 tablet (5 mg total) by mouth daily. 11/22/23   Eliodoro Guerin, DO  DULoxetine  (CYMBALTA ) 30 MG capsule Take 1 capsule (30 mg total) by mouth 2 (two) times daily. 09/10/23   Eliodoro Guerin, DO  fluocinonide  gel (LIDEX ) 0.05 % Apply 0.5gm to affected areas as directed 04/19/23   Eliodoro Guerin, DO  fluticasone  (FLONASE ) 50 MCG/ACT nasal spray Place 2 sprays into both nostrils at bedtime. 11/22/23   Eliodoro Guerin, DO  gabapentin  (NEURONTIN ) 400 MG capsule Take 2 capsules (800 mg total) by mouth 3 (three) times daily. 09/10/23   Eliodoro Guerin, DO  guaifenesin  (HUMIBID E) 400 MG TABS tablet Take 1 tablet (400 mg total) by mouth every 6 (six) hours as needed. 11/04/23   St Annice Kim, NP  meloxicam  (MOBIC ) 15 MG tablet Take 0.5-1 tablets (7.5-15 mg total) by mouth daily as needed for pain. 09/10/23   Eliodoro Guerin, DO  Multiple Vitamin (MULTIVITAMIN) tablet Take 1 tablet by mouth daily.    [provider]  omeprazole  (PRILOSEC) 40 MG capsule Take 1 capsule (40 mg total) by mouth 2 (two) times daily. 09/10/23   Eliodoro Guerin, DO  ondansetron  (ZOFRAN -ODT) 4 MG disintegrating tablet Take 1 tablet (4 mg total) by mouth every 8 (eight) hours as needed for nausea or vomiting. 06/06/23   Jhonnie Mosher, PA-C  OVER THE COUNTER MEDICATION Fish Oil-Take 2 capsule by mouth daily.    [provider]  OVER THE COUNTER MEDICATION Zinc 50mg -Take 1 capsule by mouth daily.    [provider]  oxybutynin  (DITROPAN ) 5 MG tablet TAKE 1 TABLET BY MOUTH TWICE  DAILY 12/11/23   Vicky Grange M, DO  oxyCODONE -acetaminophen  (PERCOCET) 7.5-325 MG tablet Take 1 tablet by mouth 2 (two) times daily as needed for severe pain. 11/30/22   Eliodoro Guerin, DO  Povidone, PF, (IVIZIA DRY EYES) 0.5 % SOLN Place 1 drop into both eyes every evening.    [provider]  traZODone  (DESYREL ) 100 MG tablet TAKE 1  TABLET BY MOUTH AT  BEDTIME AS NEEDED FOR SLEEP  /INSOMNIA 08/30/23   Vicky Grange M, DO  zinc gluconate 50 MG tablet Take 50 mg by mouth daily.    [provider]      Allergies    Latex and Sulfonamide derivatives    Review of Systems   Review of Systems  Physical Exam Updated Vital Signs BP (!) 172/81   Pulse 70   Temp 97.9 F (36.6 C)   Resp 16   SpO2 98%  Physical Exam Vitals reviewed.  Constitutional:      General: She is not in acute distress. HENT:     Head: Normocephalic.     Comments: Minor cuts that are healing well along with ecchymosis throughout maxillofacial region along with small hematoma above right eyebrow    Nose:     Comments: No septal hematoma or signs of epistaxis Eyes:     Extraocular Movements: Extraocular movements intact.     Conjunctiva/sclera: Conjunctivae normal.     Pupils: Pupils are equal, round, and reactive to light.  Neck:     Comments: Able to reproduce neck pain when I press in the left trapezial region No midline tenderness or abnormalities Cardiovascular:     Rate and Rhythm: Normal rate and regular rhythm.     Pulses: Normal pulses.     Heart sounds: Normal heart sounds.     Comments: 2+ bilateral radial/dorsalis pedis pulses with regular rate Pulmonary:     Effort: Pulmonary effort is normal. No respiratory distress.     Breath sounds: Normal breath sounds.  Abdominal:     Palpations: Abdomen is soft.     Tenderness: There is no abdominal tenderness. There is no guarding or rebound.  Musculoskeletal:        General: Normal range of motion.     Cervical back: Normal range of motion and neck supple.     Comments: 5 out of 5 bilateral grip/leg extension strength  Skin:    General: Skin is warm and dry.     Capillary Refill: Capillary refill takes less than 2 seconds.  Neurological:     General: No focal deficit present.     Mental Status: She is alert and oriented to person, place, and time.     Sensory:  Sensation is intact.     Motor: Motor function is intact.     Coordination: Coordination is intact.     Gait: Gait is intact.     Comments: Sensation intact in all 4 limbs Cranial nerves III through XII intact Vision grossly intact  Psychiatric:        Mood and Affect: Mood normal.     ED Results / Procedures / Treatments   Labs (all labs ordered are listed, but only abnormal results are displayed) Labs Reviewed -  No data to display  EKG None  Radiology CT Head Wo Contrast Result Date: 12/18/2023 CLINICAL DATA:  Provided history: Fall. Additional history provided: Mechanical fall (with head trauma). Facial bruising. Headaches. Neck pain. EXAM: CT HEAD WITHOUT CONTRAST CT CERVICAL SPINE WITHOUT CONTRAST TECHNIQUE: Multidetector CT imaging of the head and cervical spine was performed following the standard protocol without intravenous contrast. Multiplanar CT image reconstructions of the cervical spine were also generated. RADIATION DOSE REDUCTION: This exam was performed according to the departmental dose-optimization program which includes automated exposure control, adjustment of the mA and/or kV according to patient size and/or use of iterative reconstruction technique. COMPARISON:  Cervical spine radiographs 08/21/2023. Cervical CT myelogram 08/31/2022. FINDINGS: CT HEAD FINDINGS Brain: No age-advanced or lobar predominant cerebral atrophy. Patchy and ill-defined hypoattenuation within the cerebral white matter, mild-to-moderate for age and nonspecific. There is no acute intracranial hemorrhage. No demarcated cortical infarct. No extra-axial fluid collection. No evidence of an intracranial mass. No midline shift. Vascular: No hyperdense vessel.  Atherosclerotic calcifications. Skull: No calvarial fracture or aggressive osseous lesion. Sinuses/Orbits: No mass or acute finding within the imaged orbits. Postsurgical appearance of the paranasal sinuses. No significant inflammatory paranasal sinus  disease at the imaged levels. Other: Forehead hematoma. CT CERVICAL SPINE FINDINGS Alignment: Mild C3-C4 grade 1 anterolisthesis. Skull base and vertebrae: The basion-dental and atlanto-dental intervals are maintained.No evidence of acute fracture to the cervical spine. Prior C5-C6 ACDF. Solid C5-C6 vertebral ankylosis. Chronic fracture of the left C6 screw (series 6, image 44). Partially imaged posterior spinal fusion construct extending caudally into the thoracic spine from the C5 level. Soft tissues and spinal canal: No prevertebral fluid or swelling. No visible canal hematoma. Disc levels: Prior C5-C6 ACDF. Chronic fracture within the left C6 screw. Partially imaged posterior spinal fusion construct extending caudally into the thoracic spine from the C5 level. Cervical spondylosis. Multilevel disc space narrowing at the non-operative levels, greatest at C6-C7 (moderate to moderately advanced at this level). Multilevel disc bulges/central disc protrusions. Endplate spurring and bilateral uncovertebral hypertrophy at C3-C4. Facet arthropathy on the left at C3-C4. Streak/beam hardening artifact arising from spinal fusion hardware obscures the spinal canal at multiple levels. Within this limitation, there is no appreciable high-grade spinal canal stenosis. Uncovertebral hypertrophy results in bony neural foraminal narrowing bilaterally at C3-C4. Upper chest: No consolidation within the imaged lung apices. No visible pneumothorax. IMPRESSION: CT head: 1.  No evidence of an acute intracranial abnormality. 2. Forehead hematoma. 3. Mild-to-moderate for age cerebral white matter disease, nonspecific but most often secondary to chronic small vessel ischemia. CT cervical spine: 1. No evidence of an acute cervical spine fracture. 2. Mild C3-C4 grade 1 anterolisthesis. 3. Prior C5-C6 ACDF. Solid vertebral ankylosis at this level. Chronic fracture within the left C6 screw. 4. Partially imaged posterior spinal fusion construct  extending caudally into the thoracic spine from the C5 level. 5. Cervical spondylosis as described. Electronically Signed   By: Bascom Lily D.O.   On: 12/18/2023 16:14   CT Cervical Spine Wo Contrast Result Date: 12/18/2023 CLINICAL DATA:  Provided history: Fall. Additional history provided: Mechanical fall (with head trauma). Facial bruising. Headaches. Neck pain. EXAM: CT HEAD WITHOUT CONTRAST CT CERVICAL SPINE WITHOUT CONTRAST TECHNIQUE: Multidetector CT imaging of the head and cervical spine was performed following the standard protocol without intravenous contrast. Multiplanar CT image reconstructions of the cervical spine were also generated. RADIATION DOSE REDUCTION: This exam was performed according to the departmental dose-optimization program which includes automated exposure control, adjustment  of the mA and/or kV according to patient size and/or use of iterative reconstruction technique. COMPARISON:  Cervical spine radiographs 08/21/2023. Cervical CT myelogram 08/31/2022. FINDINGS: CT HEAD FINDINGS Brain: No age-advanced or lobar predominant cerebral atrophy. Patchy and ill-defined hypoattenuation within the cerebral white matter, mild-to-moderate for age and nonspecific. There is no acute intracranial hemorrhage. No demarcated cortical infarct. No extra-axial fluid collection. No evidence of an intracranial mass. No midline shift. Vascular: No hyperdense vessel.  Atherosclerotic calcifications. Skull: No calvarial fracture or aggressive osseous lesion. Sinuses/Orbits: No mass or acute finding within the imaged orbits. Postsurgical appearance of the paranasal sinuses. No significant inflammatory paranasal sinus disease at the imaged levels. Other: Forehead hematoma. CT CERVICAL SPINE FINDINGS Alignment: Mild C3-C4 grade 1 anterolisthesis. Skull base and vertebrae: The basion-dental and atlanto-dental intervals are maintained.No evidence of acute fracture to the cervical spine. Prior C5-C6 ACDF. Solid  C5-C6 vertebral ankylosis. Chronic fracture of the left C6 screw (series 6, image 44). Partially imaged posterior spinal fusion construct extending caudally into the thoracic spine from the C5 level. Soft tissues and spinal canal: No prevertebral fluid or swelling. No visible canal hematoma. Disc levels: Prior C5-C6 ACDF. Chronic fracture within the left C6 screw. Partially imaged posterior spinal fusion construct extending caudally into the thoracic spine from the C5 level. Cervical spondylosis. Multilevel disc space narrowing at the non-operative levels, greatest at C6-C7 (moderate to moderately advanced at this level). Multilevel disc bulges/central disc protrusions. Endplate spurring and bilateral uncovertebral hypertrophy at C3-C4. Facet arthropathy on the left at C3-C4. Streak/beam hardening artifact arising from spinal fusion hardware obscures the spinal canal at multiple levels. Within this limitation, there is no appreciable high-grade spinal canal stenosis. Uncovertebral hypertrophy results in bony neural foraminal narrowing bilaterally at C3-C4. Upper chest: No consolidation within the imaged lung apices. No visible pneumothorax. IMPRESSION: CT head: 1.  No evidence of an acute intracranial abnormality. 2. Forehead hematoma. 3. Mild-to-moderate for age cerebral white matter disease, nonspecific but most often secondary to chronic small vessel ischemia. CT cervical spine: 1. No evidence of an acute cervical spine fracture. 2. Mild C3-C4 grade 1 anterolisthesis. 3. Prior C5-C6 ACDF. Solid vertebral ankylosis at this level. Chronic fracture within the left C6 screw. 4. Partially imaged posterior spinal fusion construct extending caudally into the thoracic spine from the C5 level. 5. Cervical spondylosis as described. Electronically Signed   By: Bascom Lily D.O.   On: 12/18/2023 16:14    Procedures Procedures    Medications Ordered in ED Medications - No data to display  ED Course/ Medical Decision  Making/ A&P                                 Medical Decision Making Amount and/or Complexity of Data Reviewed Radiology: ordered.   Valerie Baker 68 y.o. presented today for fall. Working DDx that I considered at this time includes, but not limited to, vasovagal episode, mechanical fall, ICH, epidural/subdural hematoma, basilar skull fracture, anemia, electrolyte abnormalities, drug-induced, arrhythmia, UTI, fracture, contusion, soft tissue injury.  R/o DDx: vasovagal episode, ICH, epidural/subdural hematoma, basilar skull fracture, anemia, electrolyte abnormalities, drug-induced, arrhythmia, UTI, fracture: These are considered less likely due to history of present illness, physical exam, lab/imaging findings  Review of prior external notes: 12/12/2023 office visit  Unique Tests and My Independent Interpretation:  CT Head without contrast: No acute pathology CT Cervical spine without contrast: No acute pathology CT maxillofacial without contrast:  Unremarkable  Social Determinants of Health: none  Discussion with Independent Historian:  Friend  Discussion of Management of Tests: None  Risk: Medium: prescription drug management  Risk Stratification Score: None  Plan: On exam patient was no acute distress with stable vitals. Physical exam showed ecchymosis along with hematoma in the maxillofacial and frontal region but no other concerning features.  Patient is neurologically intact.  Fall occurred last week and patient has been using Tylenol  and heat and ice which seemed to help states she does have some tenderness in the left side of her neck which I am able to reproduce when I press in the left trapezial region suspicious of muscle strain.  Will give lidocaine  patch and encourage patient to use her pain meds at home as prescribed for this and use ice to decrease inflammation.  CT imaging negative and at this time do feel patient is safe to be discharged.  Will update  tetanus.  Patient was given return precautions. Patient stable for discharge at this time.  Patient verbalized understanding of plan.  This chart was dictated using voice recognition software.  Despite best efforts to proofread,  errors can occur which can change the documentation meaning.        Final Clinical Impression(s) / ED Diagnoses Final diagnoses:  None    Rx / DC Orders ED Discharge Orders     None         Elex Grimmer 12/18/23 Mee Spillers, MD 12/19/23 1402

## 2023-12-18 NOTE — ED Notes (Signed)
 AVS given.All questions answered satisfactorily.Discharghed ambulatory with significant others.

## 2023-12-19 ENCOUNTER — Telehealth: Payer: Self-pay

## 2023-12-19 NOTE — Transitions of Care (Post Inpatient/ED Visit) (Signed)
 12/19/2023  Name: Valerie Baker MRN: [id number] DOB: May 05, 1956  Today's TOC FU Call Status: Today's TOC FU Call Status:: Successful TOC FU Call Completed Patient's Name and Date of Birth confirmed.  Transition Care Management Follow-up Telephone Call Date of Discharge: 12/18/23 Discharge Facility: [facility] ([facility]) Type of Discharge: Emergency Department Reason for ED Visit: Other: (fall) How have you been since you were released from the hospital?: Better Any questions or concerns?: No  Items Reviewed: Did you receive and understand the discharge instructions provided?: Yes Medications obtained,verified, and reconciled?: Yes (Medications Reviewed) Any new allergies since your discharge?: No Dietary orders reviewed?: NA Do you have support at home?: No  Medications Reviewed Today: Medications Reviewed Today     Reviewed by Darrall Ellison, LPN (Licensed Practical Nurse) on 12/19/23 at 1614  Med List Status: <None>   Medication Order Taking? Sig Documenting Provider Last Dose Status Informant  albuterol  (VENTOLIN  HFA) 108 (90 Base) MCG/ACT inhaler [id number]  Inhale 2 puffs into the lungs every 6 (six) hours as needed for wheezing or shortness of breath. Vicky Grange M, DO  Active   ascorbic acid (VITAMIN C) 500 MG tablet [id number] No Take 500 mg by mouth daily. [provider] Taking Active Self  baclofen  (LIORESAL ) 10 MG tablet [id number] No Take 0.5-1 tablets (5-10 mg total) by mouth 3 (three) times daily as needed for muscle spasms. Vicky Grange M, DO Taking Active   buPROPion  (WELLBUTRIN  SR) 150 MG 12 hr tablet [id number] No Take 1 tablet (150 mg total) by mouth in the morning. Vicky Grange M, DO Taking Active   Calcium Carbonate-Vitamin D  600-400 MG-UNIT tablet [id number] No Take 1 tablet by mouth 2 (two) times daily. [provider] Taking Active Self  Cholecalciferol (VITAMIN D3) 2000 UNITS TABS [id number] No Take 2,000 Units by  mouth 2 (two) times daily. [provider] Taking Active Self  cycloSPORINE  (RESTASIS ) 0.05 % ophthalmic emulsion [id number] No Place 1 drop into both eyes 2 (two) times daily. [provider] Taking Active Self  desloratadine  (CLARINEX ) 5 MG tablet [id number]  Take 1 tablet (5 mg total) by mouth daily. Vicky Grange M, DO  Active   DULoxetine  (CYMBALTA ) 30 MG capsule [id number] No Take 1 capsule (30 mg total) by mouth 2 (two) times daily. Vicky Grange M, DO Taking Active   fluocinonide  gel (LIDEX ) 0.05 % [id number] No Apply 0.5gm to affected areas as directed Eliodoro Guerin, DO Taking Active   fluticasone  (FLONASE ) 50 MCG/ACT nasal spray [id number]  Place 2 sprays into both nostrils at bedtime. Vicky Grange M, DO  Active   gabapentin  (NEURONTIN ) 400 MG capsule [id number] No Take 2 capsules (800 mg total) by mouth 3 (three) times daily. Vicky Grange M, DO Taking Active   guaifenesin  (HUMIBID E) 400 MG TABS tablet [id number] No Take 1 tablet (400 mg total) by mouth every 6 (six) hours as needed. St Verma Gobble, Adell Hones, NP Taking Active   meloxicam  (MOBIC ) 15 MG tablet [id number] No Take 0.5-1 tablets (7.5-15 mg total) by mouth daily as needed for pain. Vicky Grange M, DO Taking Active   Multiple Vitamin (MULTIVITAMIN) tablet [id number] No Take 1 tablet by mouth daily. [provider] Taking Active Self  omeprazole  (PRILOSEC) 40 MG capsule [id number] No Take 1 capsule (40 mg total) by mouth 2 (two) times daily. Vicky Grange M, DO Taking Active   ondansetron  (ZOFRAN -ODT) 4 MG disintegrating tablet [id number] No Take 1 tablet (4 mg total) by mouth every 8 (eight) hours as  needed for nausea or vomiting. Jhonnie Mosher, PA-C Taking Active   OVER THE COUNTER MEDICATION [id number] No Fish Oil-Take 2 capsule by mouth daily. [provider] Taking Active   OVER THE COUNTER MEDICATION [id number] No Zinc 50mg -Take 1 capsule by mouth daily. [provider] Taking Active   oxybutynin  (DITROPAN ) 5 MG tablet [id number]  TAKE 1 TABLET BY MOUTH TWICE  DAILY Gottschalk, Ashly M, DO  Active   oxyCODONE -acetaminophen  (PERCOCET) 7.5-325 MG tablet [id number] No Take 1 tablet by mouth 2 (two) times daily as needed for severe pain. Vicky Grange M, DO Taking Active   Povidone, PF, (IVIZIA DRY EYES) 0.5 % SOLN [id number] No Place 1 drop into both eyes every evening. [provider] Taking Active Self  traZODone  (DESYREL ) 100 MG tablet [id number] No TAKE 1 TABLET BY MOUTH AT  BEDTIME AS NEEDED FOR SLEEP  /INSOMNIA Eliodoro Guerin, DO Taking Active   zinc gluconate 50 MG tablet [id number] No Take 50 mg by mouth daily. [provider] Taking Active Self            Home Care and Equipment/Supplies: Were Home Health Services Ordered?: NA Any new equipment or medical supplies ordered?: NA  Functional Questionnaire: Do you need assistance with bathing/showering or dressing?: No Do you need assistance with meal preparation?: No Do you need assistance with eating?: No Do you have difficulty maintaining continence: No Do you need assistance with getting out of bed/getting out of a chair/moving?: No Do you have difficulty managing or taking your medications?: No  Follow up appointments reviewed: PCP Follow-up appointment confirmed?: No (declined) MD Provider Line Number:[phone] Given: No Specialist Hospital Follow-up appointment confirmed?: NA Do you need transportation to your follow-up appointment?: No Do you understand care options if your condition(s) worsen?: Yes-patient verbalized understanding    SIGNATURE Darrall Ellison, LPN [facility] Nurse Health Advisor Direct Dial [phone]

## 2023-12-21 ENCOUNTER — Emergency Department (HOSPITAL_BASED_OUTPATIENT_CLINIC_OR_DEPARTMENT_OTHER): Admitting: Radiology

## 2023-12-21 ENCOUNTER — Emergency Department (HOSPITAL_BASED_OUTPATIENT_CLINIC_OR_DEPARTMENT_OTHER)

## 2023-12-21 ENCOUNTER — Encounter (HOSPITAL_BASED_OUTPATIENT_CLINIC_OR_DEPARTMENT_OTHER): Payer: Self-pay | Admitting: *Deleted

## 2023-12-21 ENCOUNTER — Emergency Department (HOSPITAL_BASED_OUTPATIENT_CLINIC_OR_DEPARTMENT_OTHER)
Admission: EM | Admit: 2023-12-21 | Discharge: 2023-12-21 | Disposition: A | Discharge status: Home / Self Care | Attending: Emergency Medicine | Admitting: Emergency Medicine

## 2023-12-21 ENCOUNTER — Other Ambulatory Visit: Payer: Self-pay

## 2023-12-21 DIAGNOSIS — M542 Cervicalgia: Secondary | ICD-10-CM | POA: Diagnosis not present

## 2023-12-21 DIAGNOSIS — W01198A Fall on same level from slipping, tripping and stumbling with subsequent striking against other object, initial encounter: Secondary | ICD-10-CM | POA: Insufficient documentation

## 2023-12-21 DIAGNOSIS — S199XXA Unspecified injury of neck, initial encounter: Secondary | ICD-10-CM | POA: Insufficient documentation

## 2023-12-21 DIAGNOSIS — Z91040 Latex allergy status: Secondary | ICD-10-CM | POA: Insufficient documentation

## 2023-12-21 DIAGNOSIS — S40011A Contusion of right shoulder, initial encounter: Secondary | ICD-10-CM | POA: Diagnosis not present

## 2023-12-21 DIAGNOSIS — S12500A Unspecified displaced fracture of sixth cervical vertebra, initial encounter for closed fracture: Secondary | ICD-10-CM | POA: Diagnosis not present

## 2023-12-21 DIAGNOSIS — M19011 Primary osteoarthritis, right shoulder: Secondary | ICD-10-CM | POA: Diagnosis not present

## 2023-12-21 DIAGNOSIS — S0990XA Unspecified injury of head, initial encounter: Secondary | ICD-10-CM | POA: Diagnosis not present

## 2023-12-21 DIAGNOSIS — Z043 Encounter for examination and observation following other accident: Secondary | ICD-10-CM | POA: Diagnosis not present

## 2023-12-21 DIAGNOSIS — M25511 Pain in right shoulder: Secondary | ICD-10-CM | POA: Diagnosis not present

## 2023-12-21 DIAGNOSIS — S0083XA Contusion of other part of head, initial encounter: Secondary | ICD-10-CM | POA: Diagnosis not present

## 2023-12-21 DIAGNOSIS — W19XXXA Unspecified fall, initial encounter: Secondary | ICD-10-CM

## 2023-12-21 DIAGNOSIS — S4991XA Unspecified injury of right shoulder and upper arm, initial encounter: Secondary | ICD-10-CM | POA: Diagnosis present

## 2023-12-21 DIAGNOSIS — S12400A Unspecified displaced fracture of fifth cervical vertebra, initial encounter for closed fracture: Secondary | ICD-10-CM | POA: Diagnosis not present

## 2023-12-21 DIAGNOSIS — Z981 Arthrodesis status: Secondary | ICD-10-CM | POA: Diagnosis not present

## 2023-12-21 MED ORDER — HYDROMORPHONE HCL 1 MG/ML IJ SOLN
1.0000 mg | Freq: Once | INTRAMUSCULAR | Status: AC
  Administered 2023-12-21: 1 mg via INTRAMUSCULAR
  Filled 2023-12-21: qty 1

## 2023-12-21 MED ORDER — CYCLOBENZAPRINE HCL 10 MG PO TABS
10.0000 mg | ORAL_TABLET | Freq: Once | ORAL | Status: AC
  Administered 2023-12-21: 10 mg via ORAL
  Filled 2023-12-21: qty 1

## 2023-12-21 MED ORDER — OXYCODONE-ACETAMINOPHEN 5-325 MG PO TABS
2.0000 | ORAL_TABLET | Freq: Once | ORAL | Status: AC
  Administered 2023-12-21: 2 via ORAL
  Filled 2023-12-21: qty 2

## 2023-12-21 NOTE — ED Provider Notes (Signed)
 [facility] AT [facility] Provider Note   CSN: [id number] Arrival date & time: 12/21/23  1657     History  Chief Complaint  Patient presents with   Valerie Baker is a 68 y.o. female with history of depression, constipation here presenting with fall.  Patient states that she was trying to get something and her legs slipped and she fell and hit her head and also right shoulder area.  She was concerned that she felt a pop in her neck.  This happened around 3 PM and patient took her oxycodone  prescription and states that it did not help her with the pain.   The history is provided by the patient.       Home Medications Prior to Admission medications   Medication Sig Start Date End Date Taking? Authorizing Provider  albuterol  (VENTOLIN  HFA) 108 (90 Base) MCG/ACT inhaler Inhale 2 puffs into the lungs every 6 (six) hours as needed for wheezing or shortness of breath. 11/18/23   Eliodoro Guerin, DO  ascorbic acid (VITAMIN C) 500 MG tablet Take 500 mg by mouth daily.    [provider]  baclofen  (LIORESAL ) 10 MG tablet Take 0.5-1 tablets (5-10 mg total) by mouth 3 (three) times daily as needed for muscle spasms. 09/10/23   Eliodoro Guerin, DO  buPROPion  (WELLBUTRIN  SR) 150 MG 12 hr tablet Take 1 tablet (150 mg total) by mouth in the morning. 09/10/23   Eliodoro Guerin, DO  Calcium Carbonate-Vitamin D  600-400 MG-UNIT tablet Take 1 tablet by mouth 2 (two) times daily.    [provider]  Cholecalciferol (VITAMIN D3) 2000 UNITS TABS Take 2,000 Units by mouth 2 (two) times daily.    [provider]  cycloSPORINE  (RESTASIS ) 0.05 % ophthalmic emulsion Place 1 drop into both eyes 2 (two) times daily.    [provider]  desloratadine  (CLARINEX ) 5 MG tablet Take 1 tablet (5 mg total) by mouth daily. 11/22/23   Eliodoro Guerin, DO  DULoxetine  (CYMBALTA ) 30 MG capsule Take 1 capsule (30 mg total) by mouth 2  (two) times daily. 09/10/23   Eliodoro Guerin, DO  fluocinonide  gel (LIDEX ) 0.05 % Apply 0.5gm to affected areas as directed 04/19/23   Eliodoro Guerin, DO  fluticasone  (FLONASE ) 50 MCG/ACT nasal spray Place 2 sprays into both nostrils at bedtime. 11/22/23   Eliodoro Guerin, DO  gabapentin  (NEURONTIN ) 400 MG capsule Take 2 capsules (800 mg total) by mouth 3 (three) times daily. 09/10/23   Eliodoro Guerin, DO  guaifenesin  (HUMIBID E) 400 MG TABS tablet Take 1 tablet (400 mg total) by mouth every 6 (six) hours as needed. 11/04/23   St Annice Kim, NP  meloxicam  (MOBIC ) 15 MG tablet Take 0.5-1 tablets (7.5-15 mg total) by mouth daily as needed for pain. 09/10/23   Eliodoro Guerin, DO  Multiple Vitamin (MULTIVITAMIN) tablet Take 1 tablet by mouth daily.    [provider]  omeprazole  (PRILOSEC) 40 MG capsule Take 1 capsule (40 mg total) by mouth 2 (two) times daily. 09/10/23   Eliodoro Guerin, DO  ondansetron  (ZOFRAN -ODT) 4 MG disintegrating tablet Take 1 tablet (4 mg total) by mouth every 8 (eight) hours as needed for nausea or vomiting. 06/06/23   Jhonnie Mosher, PA-C  OVER THE COUNTER MEDICATION Fish Oil-Take 2 capsule by mouth daily.    [provider]  OVER THE COUNTER MEDICATION Zinc 50mg -Take 1 capsule by mouth  daily.    [provider]  oxybutynin  (DITROPAN ) 5 MG tablet TAKE 1 TABLET BY MOUTH TWICE  DAILY 12/11/23   Vicky Grange M, DO  oxyCODONE -acetaminophen  (PERCOCET) 7.5-325 MG tablet Take 1 tablet by mouth 2 (two) times daily as needed for severe pain. 11/30/22   Eliodoro Guerin, DO  Povidone, PF, (IVIZIA DRY EYES) 0.5 % SOLN Place 1 drop into both eyes every evening.    [provider]  traZODone  (DESYREL ) 100 MG tablet TAKE 1 TABLET BY MOUTH AT  BEDTIME AS NEEDED FOR SLEEP  /INSOMNIA 08/30/23   Vicky Grange M, DO  zinc gluconate 50 MG tablet Take 50 mg by mouth daily.    [provider]      Allergies     Latex and Sulfonamide derivatives    Review of Systems   Review of Systems  Musculoskeletal:        Right shoulder pain and neck pain  All other systems reviewed and are negative.   Physical Exam Updated Vital Signs BP (!) 151/70 (BP Location: Left Arm)   Pulse 67   Temp 98.7 F (37.1 C) (Oral)   Resp 16   Ht 5\' 2"  (1.575 m)   Wt 96.2 kg   SpO2 98%   BMI 38.78 kg/m  Physical Exam Vitals and nursing note reviewed.  Constitutional:      Appearance: Normal appearance.  HENT:     Head: Normocephalic.     Comments: Patient had old bruising of the face from fall a week ago.  No facial tenderness    Mouth/Throat:     Mouth: Mucous membranes are moist.  Eyes:     Extraocular Movements: Extraocular movements intact.     Pupils: Pupils are equal, round, and reactive to light.  Neck:     Comments: Patient has bilateral paracervical tenderness Cardiovascular:     Rate and Rhythm: Normal rate and regular rhythm.     Pulses: Normal pulses.     Heart sounds: Normal heart sounds.  Pulmonary:     Effort: Pulmonary effort is normal.     Breath sounds: Normal breath sounds.  Abdominal:     General: Abdomen is flat.     Palpations: Abdomen is soft.  Musculoskeletal:     Comments: Patient has some tenderness of the right scapular area.  Patient is able to range the right shoulder.  Patient has normal range of motion bilateral hips.  No other extremity trauma  Skin:    General: Skin is warm.     Capillary Refill: Capillary refill takes less than 2 seconds.  Neurological:     General: No focal deficit present.     Mental Status: She is alert and oriented to person, place, and time.  Psychiatric:        Mood and Affect: Mood normal.        Behavior: Behavior normal.     ED Results / Procedures / Treatments   Labs (all labs ordered are listed, but only abnormal results are displayed) Labs Reviewed - No data to display  EKG None  Radiology No results  found.  Procedures Procedures    Medications Ordered in ED Medications  HYDROmorphone  (DILAUDID ) injection 1 mg (has no administration in time range)    ED Course/ Medical Decision Making/ A&P  Medical Decision Making Valerie Baker is a 68 y.o. female here with fall and right shoulder injury and head injury and neck injury.  Patient was just seen here last week after a fall.  Plan to get CT head and cervical spine and also chest x-ray and right shoulder x-ray.  Will give pain meds and reassess.  8:41 PM CT head and cervical spine not show any fracture.  Patient does have a right forehead hematoma that is decreased from previous.  Shoulder and chest x-ray unremarkable.  Patient received pain medicine and felt better.  Patient has pain medicine and muscle relaxant at home.  I told her that if she needs to take more she needs to call her pain management doctor  Problems Addressed: Contusion of right shoulder, initial encounter: acute illness or injury Fall, initial encounter: acute illness or injury  Amount and/or Complexity of Data Reviewed Radiology: ordered and independent interpretation performed. Decision-making details documented in ED Course.  Risk Prescription drug management.    Final Clinical Impression(s) / ED Diagnoses Final diagnoses:  None    Rx / DC Orders ED Discharge Orders     None         Dalene Duck, MD 12/21/23 2042

## 2023-12-21 NOTE — Discharge Instructions (Signed)
 Your x-rays and CT scans did not show any fracture  Please continue taking your oxycodone  and your muscle relaxant as prescribed by your doctor.  If you run out early you need to let your doctor know  Return to ER if you have another fall or severe pain

## 2023-12-21 NOTE — ED Triage Notes (Addendum)
 Pt to ED POV after a mechanical fall on gravel. Patient fell backwards on her right shoulder. Neck and right shoulder pain. No obvious deformity but patient has decreased range of motion in right arm. No head injury. Patient has spinal fusion and reports hearing a "pop" after falling.

## 2023-12-21 NOTE — ED Notes (Signed)
 Patient transported to CT

## 2023-12-21 NOTE — ED Notes (Signed)
Reviewed discharge instructions and home care with pt. Pt verbalized understanding and had no further questions. Pt exited ED without complications.

## 2023-12-24 DIAGNOSIS — D2372 Other benign neoplasm of skin of left lower limb, including hip: Secondary | ICD-10-CM | POA: Diagnosis not present

## 2023-12-24 DIAGNOSIS — Z85828 Personal history of other malignant neoplasm of skin: Secondary | ICD-10-CM | POA: Diagnosis not present

## 2023-12-24 DIAGNOSIS — L565 Disseminated superficial actinic porokeratosis (DSAP): Secondary | ICD-10-CM | POA: Diagnosis not present

## 2023-12-24 DIAGNOSIS — L814 Other melanin hyperpigmentation: Secondary | ICD-10-CM | POA: Diagnosis not present

## 2023-12-24 DIAGNOSIS — L905 Scar conditions and fibrosis of skin: Secondary | ICD-10-CM | POA: Diagnosis not present

## 2023-12-24 DIAGNOSIS — D225 Melanocytic nevi of trunk: Secondary | ICD-10-CM | POA: Diagnosis not present

## 2023-12-24 DIAGNOSIS — L821 Other seborrheic keratosis: Secondary | ICD-10-CM | POA: Diagnosis not present

## 2024-01-16 DIAGNOSIS — G894 Chronic pain syndrome: Secondary | ICD-10-CM | POA: Diagnosis not present

## 2024-01-16 DIAGNOSIS — M542 Cervicalgia: Secondary | ICD-10-CM | POA: Diagnosis not present

## 2024-01-16 DIAGNOSIS — M791 Myalgia, unspecified site: Secondary | ICD-10-CM | POA: Diagnosis not present

## 2024-04-07 DIAGNOSIS — M25511 Pain in right shoulder: Secondary | ICD-10-CM | POA: Diagnosis not present

## 2024-04-09 DIAGNOSIS — M25511 Pain in right shoulder: Secondary | ICD-10-CM | POA: Diagnosis not present

## 2024-05-06 ENCOUNTER — Other Ambulatory Visit: Payer: Self-pay | Admitting: Family Medicine

## 2024-05-06 DIAGNOSIS — R350 Frequency of micturition: Secondary | ICD-10-CM

## 2024-05-20 ENCOUNTER — Encounter: Payer: Self-pay | Admitting: Family Medicine

## 2024-05-20 ENCOUNTER — Ambulatory Visit: Admitting: Family Medicine

## 2024-05-20 VITALS — BP 136/66 | HR 68 | Temp 97.5°F | Ht 64.0 in | Wt 206.4 lb

## 2024-05-20 DIAGNOSIS — R350 Frequency of micturition: Secondary | ICD-10-CM | POA: Diagnosis not present

## 2024-05-20 DIAGNOSIS — F339 Major depressive disorder, recurrent, unspecified: Secondary | ICD-10-CM

## 2024-05-20 DIAGNOSIS — M51372 Other intervertebral disc degeneration, lumbosacral region with discogenic back pain and lower extremity pain: Secondary | ICD-10-CM | POA: Diagnosis not present

## 2024-05-20 DIAGNOSIS — R5382 Chronic fatigue, unspecified: Secondary | ICD-10-CM | POA: Diagnosis not present

## 2024-05-20 DIAGNOSIS — D649 Anemia, unspecified: Secondary | ICD-10-CM

## 2024-05-20 DIAGNOSIS — Z23 Encounter for immunization: Secondary | ICD-10-CM | POA: Diagnosis not present

## 2024-05-20 DIAGNOSIS — J40 Bronchitis, not specified as acute or chronic: Secondary | ICD-10-CM | POA: Diagnosis not present

## 2024-05-20 DIAGNOSIS — E7800 Pure hypercholesterolemia, unspecified: Secondary | ICD-10-CM

## 2024-05-20 DIAGNOSIS — K219 Gastro-esophageal reflux disease without esophagitis: Secondary | ICD-10-CM | POA: Diagnosis not present

## 2024-05-20 MED ORDER — ALBUTEROL SULFATE HFA 108 (90 BASE) MCG/ACT IN AERS: 2.0000 | INHALATION_SPRAY | Freq: Four times a day (QID) | RESPIRATORY_TRACT | 0 refills | Status: AC | PRN

## 2024-05-20 MED ORDER — OMEPRAZOLE 40 MG PO CPDR: 40.0000 mg | DELAYED_RELEASE_CAPSULE | Freq: Two times a day (BID) | ORAL | 3 refills | Status: AC

## 2024-05-20 MED ORDER — BUPROPION HCL ER (SR) 150 MG PO TB12: 150.0000 mg | ORAL_TABLET | Freq: Every morning | ORAL | 3 refills | Status: DC

## 2024-05-20 MED ORDER — MELOXICAM 15 MG PO TABS: 7.5000 mg | ORAL_TABLET | Freq: Every day | ORAL | 3 refills | Status: DC | PRN

## 2024-05-20 MED ORDER — GABAPENTIN 400 MG PO CAPS: 800.0000 mg | ORAL_CAPSULE | Freq: Three times a day (TID) | ORAL | 3 refills | Status: DC

## 2024-05-20 MED ORDER — TRAZODONE HCL 100 MG PO TABS: 100.0000 mg | ORAL_TABLET | Freq: Every evening | ORAL | 3 refills | Status: DC | PRN

## 2024-05-20 MED ORDER — DULOXETINE HCL 30 MG PO CPEP: 30.0000 mg | ORAL_CAPSULE | Freq: Two times a day (BID) | ORAL | 3 refills | Status: DC

## 2024-05-20 MED ORDER — BACLOFEN 10 MG PO TABS: 5.0000 mg | ORAL_TABLET | Freq: Three times a day (TID) | ORAL | 3 refills | Status: AC | PRN

## 2024-05-20 MED ORDER — OXYBUTYNIN CHLORIDE 5 MG PO TABS: 5.0000 mg | ORAL_TABLET | Freq: Two times a day (BID) | ORAL | 0 refills | Status: DC

## 2024-05-20 NOTE — Progress Notes (Signed)
 Subjective: CC: 76-month follow-up PCP: Jolinda Norene HERO, DO YEP:Zozjwnm Valerie Baker is a 68 y.o. female presenting to clinic today for:  Chronic fatigue She reports that she has been having chronic fatigue.  She does not have any apneic symptoms that she knows of and denies snoring at nighttime.  She did have some evidence of anemia on last lab draw and would like to go ahead proceed with repeat labs along with B12 testing if possible today.  She denies any bleeding including vaginal or rectal bleeding.  She does eat meat and is not on a restricted diet.  She does not report any shortness of breath, chest pain, dizziness or falls  ROS: Per HPI  Allergies  Allergen Reactions   Latex Itching and Rash   Sulfonamide Derivatives Rash   Past Medical History:  Diagnosis Date   Adenomatous colon polyp 06/13/2006   Allergy    SINUS   Anxiety    Anxiety and depression    Arthritis    Asthma    Bladder incontinence    Blood transfusion    Cataract    Depression    Fibromyalgia    GERD (gastroesophageal reflux disease)    History of kidney stones    Hyperlipidemia    Kidney stones    Obesity    Osteopenia    Post-operative nausea and vomiting    Stress fracture of pelvis 11/03/2014    Current Outpatient Medications:    albuterol  (VENTOLIN  HFA) 108 (90 Base) MCG/ACT inhaler, Inhale 2 puffs into the lungs every 6 (six) hours as needed for wheezing or shortness of breath., Disp: 20.1 g, Rfl: 0   ascorbic acid (VITAMIN C) 500 MG tablet, Take 500 mg by mouth daily., Disp: , Rfl:    baclofen  (LIORESAL ) 10 MG tablet, Take 0.5-1 tablets (5-10 mg total) by mouth 3 (three) times daily as needed for muscle spasms., Disp: 270 tablet, Rfl: 3   buPROPion  (WELLBUTRIN  SR) 150 MG 12 hr tablet, Take 1 tablet (150 mg total) by mouth in the morning., Disp: 100 tablet, Rfl: 3   Calcium Carbonate-Vitamin D  600-400 MG-UNIT tablet, Take 1 tablet by mouth 2 (two) times daily., Disp: , Rfl:     Cholecalciferol (VITAMIN D3) 2000 UNITS TABS, Take 2,000 Units by mouth 2 (two) times daily., Disp: , Rfl:    cycloSPORINE  (RESTASIS ) 0.05 % ophthalmic emulsion, Place 1 drop into both eyes 2 (two) times daily., Disp: , Rfl:    desloratadine  (CLARINEX ) 5 MG tablet, Take 1 tablet (5 mg total) by mouth daily., Disp: 90 tablet, Rfl: 3   DULoxetine  (CYMBALTA ) 30 MG capsule, Take 1 capsule (30 mg total) by mouth 2 (two) times daily., Disp: 200 capsule, Rfl: 3   fluocinonide  gel (LIDEX ) 0.05 %, Apply 0.5gm to affected areas as directed, Disp: 60 g, Rfl: 1   fluticasone  (FLONASE ) 50 MCG/ACT nasal spray, Place 2 sprays into both nostrils at bedtime., Disp: 48 g, Rfl: 0   gabapentin  (NEURONTIN ) 400 MG capsule, Take 2 capsules (800 mg total) by mouth 3 (three) times daily., Disp: 600 capsule, Rfl: 3   guaifenesin  (HUMIBID E) 400 MG TABS tablet, Take 1 tablet (400 mg total) by mouth every 6 (six) hours as needed., Disp: 30 tablet, Rfl: 0   meloxicam  (MOBIC ) 15 MG tablet, Take 0.5-1 tablets (7.5-15 mg total) by mouth daily as needed for pain., Disp: 100 tablet, Rfl: 3   Multiple Vitamin (MULTIVITAMIN) tablet, Take 1 tablet by mouth daily., Disp: , Rfl:  omeprazole  (PRILOSEC) 40 MG capsule, Take 1 capsule (40 mg total) by mouth 2 (two) times daily., Disp: 200 capsule, Rfl: 3   ondansetron  (ZOFRAN -ODT) 4 MG disintegrating tablet, Take 1 tablet (4 mg total) by mouth every 8 (eight) hours as needed for nausea or vomiting., Disp: 20 tablet, Rfl: 0   OVER THE COUNTER MEDICATION, Fish Oil-Take 2 capsule by mouth daily., Disp: , Rfl:    OVER THE COUNTER MEDICATION, Zinc 50mg -Take 1 capsule by mouth daily., Disp: , Rfl:    oxybutynin  (DITROPAN ) 5 MG tablet, TAKE 1 TABLET BY MOUTH TWICE  DAILY, Disp: 200 tablet, Rfl: 0   oxyCODONE -acetaminophen  (PERCOCET) 7.5-325 MG tablet, Take 1 tablet by mouth 2 (two) times daily as needed for severe pain., Disp: 60 tablet, Rfl: 0   Povidone, PF, (IVIZIA DRY EYES) 0.5 % SOLN, Place 1  drop into both eyes every evening., Disp: , Rfl:    traZODone  (DESYREL ) 100 MG tablet, TAKE 1 TABLET BY MOUTH AT  BEDTIME AS NEEDED FOR SLEEP  /INSOMNIA, Disp: 100 tablet, Rfl: 3   zinc gluconate 50 MG tablet, Take 50 mg by mouth daily., Disp: , Rfl:  Social History   Socioeconomic History   Marital status: Married    Spouse name: Not on file   Number of children: 3   Years of education: Not on file   Highest education level: Not on file  Occupational History   Occupation: Disabled  Tobacco Use   Smoking status: Former    Current packs/day: 0.00    Average packs/day: 2.0 packs/day for 5.0 years (10.0 ttl pk-yrs)    Types: Cigarettes    Start date: 11/02/2000    Quit date: 11/02/2005    Years since quitting: 18.5   Smokeless tobacco: Never  Vaping Use   Vaping status: Never Used  Substance and Sexual Activity   Alcohol  use: No   Drug use: No   Sexual activity: Yes  Other Topics Concern   Not on file  Social History Narrative   Not on file   Social Drivers of Health   Financial Resource Strain: Low Risk  (01/16/2024)   Received from [facility]   Overall Financial Resource Strain (CARDIA)    Difficulty of Paying Living Expenses: Not hard at all  Food Insecurity: No Food Insecurity (01/16/2024)   Received from [facility]   Hunger Vital Sign    Within the past 12 months, you worried that your food would run out before you got the money to buy more.: Never true    Within the past 12 months, the food you bought just didn't last and you didn't have money to get more.: Never true  Transportation Needs: No Transportation Needs (01/16/2024)   Received from [facility] - Transportation    Lack of Transportation (Medical): No    Lack of Transportation (Non-Medical): No  Physical Activity: Insufficiently Active (07/01/2023)   Exercise Vital Sign    Days of Exercise per Week: 2 days    Minutes of Exercise per Session: 20 min  Stress: No Stress Concern Present  (07/01/2023)   [employer] of Occupational [employer] - Occupational Stress Questionnaire    Feeling of Stress : Not at all  Social Connections: Moderately Integrated (07/01/2023)   Social Connection and Isolation Panel    Frequency of Communication with Friends and Family: More than three times a week    Frequency of Social Gatherings with Friends and Family: More than three times a week  Attends Religious Services: More than 4 times per year    Active Member of Clubs or Organizations: No    Attends Banker Meetings: Never    Marital Status: Married  Catering manager Violence: Not At Risk (07/01/2023)   Humiliation, Afraid, Rape, and Kick questionnaire    Fear of Current or Ex-Partner: No    Emotionally Abused: No    Physically Abused: No    Sexually Abused: No   Family History  Problem Relation Age of Onset   Heart disease Mother    Diabetes Mother    Melanoma Mother        nose   Stroke Father    Colon polyps Sister    Hypertension Sister    Skin cancer Sister    Diabetes Brother    Cirrhosis Brother    Hypertension Brother    Colon polyps Brother    Hypertension Brother    Stomach cancer Other    Rectal cancer Other    Esophageal cancer Other    Colon cancer Other     Objective: Office vital signs reviewed. BP 136/66   Pulse 68   Temp (!) 97.5 F (36.4 C)   Ht 5' 4 (1.626 m)   Wt 206 lb 6 oz (93.6 kg)   SpO2 94%   BMI 35.42 kg/m   Physical Examination:  General: Awake, alert, morbidly obese, No acute distress HEENT: Sclera white.  Moist mucous membranes.  No lymphadenopathy.  No thyromegaly Cardio: regular rate and rhythm, S1S2 heard, no murmurs appreciated Pulm: clear to auscultation bilaterally, no wheezes, rhonchi or rales; normal work of breathing on room air  Assessment/ Plan: 68 y.o. female   Anemia, unspecified type - Plan: Anemia Profile B  Pure hypercholesterolemia - Plan: Lipid Panel  Chronic fatigue - Plan: TSH + free  T4  Need for influenza vaccination - Plan: Flu vaccine HIGH DOSE PF(Fluzone Trivalent)  Bronchitis - Plan: albuterol  (VENTOLIN  HFA) 108 (90 Base) MCG/ACT inhaler  Degeneration of intervertebral disc of lumbosacral region with discogenic back pain and lower extremity pain - Plan: baclofen  (LIORESAL ) 10 MG tablet, gabapentin  (NEURONTIN ) 400 MG capsule, meloxicam  (MOBIC ) 15 MG tablet  Depression, recurrent - Plan: buPROPion  (WELLBUTRIN  SR) 150 MG 12 hr tablet, DULoxetine  (CYMBALTA ) 30 MG capsule, traZODone  (DESYREL ) 100 MG tablet  Gastroesophageal reflux disease without esophagitis - Plan: omeprazole  (PRILOSEC) 40 MG capsule  Urinary frequency - Plan: oxybutynin  (DITROPAN ) 5 MG tablet   Given reports of chronic fatigue I do think it is important to repeat anemia panel and will also check thyroid  levels.  She is not taking any multivitamins containing iron.  We may need to consider adding 1 if iron level showed to be low.  If there is no evidence of vitamin deficiency, may need to consider evaluation for possible insidious bleed causing anemia.  If anemia is resolved and she continues to have chronic fatigue, low threshold to evaluate for possible obstructive sleep apnea given body habitus.  We also discussed today that both gabapentin  and baclofen  could cause increased sleepiness  Repeat lipid panel collected today given uncontrolled levels at last visit.  Her influenza vaccination was administered.  Her other issues are chronic and stable and we did not discuss them but she needed refills   Norene CHRISTELLA Fielding, DO [facility] [phone]

## 2024-05-21 LAB — ANEMIA PROFILE B
Basophils Absolute: 0.1 x10E3/uL (ref 0.0–0.2)
Basos: 1 %
EOS (ABSOLUTE): 0.4 x10E3/uL (ref 0.0–0.4)
Eos: 7 %
Ferritin: 34 ng/mL (ref 15–150)
Folate: 19.7 ng/mL (ref >3.0)
Hematocrit: 38.1 % (ref 34.0–46.6)
Hemoglobin: 12.3 g/dL (ref 11.1–15.9)
Immature Grans (Abs): 0 x10E3/uL (ref 0.0–0.1)
Immature Granulocytes: 0 %
Iron Saturation: 22 % (ref 15–55)
Iron: 69 ug/dL (ref 27–139)
Lymphocytes Absolute: 1.8 x10E3/uL (ref 0.7–3.1)
Lymphs: 32 %
MCH: 30.8 pg (ref 26.6–33.0)
MCHC: 32.3 g/dL (ref 31.5–35.7)
MCV: 96 fL (ref 79–97)
Monocytes Absolute: 0.5 x10E3/uL (ref 0.1–0.9)
Monocytes: 8 %
Neutrophils Absolute: 3 x10E3/uL (ref 1.4–7.0)
Neutrophils: 52 %
Platelets: 239 x10E3/uL (ref 150–450)
RBC: 3.99 x10E6/uL (ref 3.77–5.28)
RDW: 13.2 % (ref 11.7–15.4)
Retic Ct Pct: 1.8 % (ref 0.6–2.6)
Total Iron Binding Capacity: 318 ug/dL (ref 250–450)
UIBC: 249 ug/dL (ref 118–369)
Vitamin B-12: 638 pg/mL (ref 232–1245)
WBC: 5.7 x10E3/uL (ref 3.4–10.8)

## 2024-05-21 LAB — LIPID PANEL
Chol/HDL Ratio: 3 ratio (ref 0.0–4.4)
Cholesterol, Total: 223 mg/dL — ABNORMAL HIGH (ref 100–199)
HDL: 74 mg/dL (ref >39)
LDL Chol Calc (NIH): 135 mg/dL — ABNORMAL HIGH (ref 0–99)
Triglycerides: 78 mg/dL (ref 0–149)
VLDL Cholesterol Cal: 14 mg/dL (ref 5–40)

## 2024-05-21 LAB — TSH+FREE T4
Free T4: 0.98 ng/dL (ref 0.82–1.77)
TSH: 3.08 u[IU]/mL (ref 0.450–4.500)

## 2024-05-22 ENCOUNTER — Other Ambulatory Visit: Payer: Self-pay | Admitting: Family Medicine

## 2024-05-22 ENCOUNTER — Ambulatory Visit: Payer: Self-pay | Admitting: Family Medicine

## 2024-05-22 DIAGNOSIS — E038 Other specified hypothyroidism: Secondary | ICD-10-CM

## 2024-05-22 MED ORDER — LEVOTHYROXINE SODIUM 25 MCG PO TABS: 25.0000 ug | ORAL_TABLET | Freq: Every day | ORAL | 3 refills | Status: DC

## 2024-05-22 MED ORDER — LEVOTHYROXINE SODIUM 25 MCG PO TABS
25.0000 ug | ORAL_TABLET | Freq: Every day | ORAL | 3 refills | Status: AC
Start: 2024-05-22 — End: ?

## 2024-05-25 ENCOUNTER — Encounter: Payer: Self-pay | Admitting: Nurse Practitioner

## 2024-05-25 ENCOUNTER — Ambulatory Visit: Admitting: Nurse Practitioner

## 2024-05-25 ENCOUNTER — Ambulatory Visit: Payer: Self-pay

## 2024-05-25 VITALS — BP 125/73 | HR 66 | Temp 97.3°F | Ht 64.0 in | Wt 206.0 lb

## 2024-05-25 DIAGNOSIS — H6692 Otitis media, unspecified, left ear: Secondary | ICD-10-CM | POA: Diagnosis not present

## 2024-05-25 DIAGNOSIS — J0100 Acute maxillary sinusitis, unspecified: Secondary | ICD-10-CM | POA: Diagnosis not present

## 2024-05-25 DIAGNOSIS — H6691 Otitis media, unspecified, right ear: Secondary | ICD-10-CM | POA: Insufficient documentation

## 2024-05-25 MED ORDER — AMOXICILLIN 875 MG PO TABS: 875.0000 mg | ORAL_TABLET | Freq: Two times a day (BID) | ORAL | 0 refills | Status: AC

## 2024-05-25 NOTE — Telephone Encounter (Signed)
  FYI Only or Action Required?: Action required by provider: request for appointment.  Patient was last seen in primary care on 05/20/2024 by Jolinda Norene HERO, DO.  Called Nurse Triage reporting Facial Pain.  Symptoms began several weeks ago.  Interventions attempted: Nothing.  Symptoms are: unchanged.  Triage Disposition: See Physician Within 24 Hours  Patient/caregiver understands and will follow disposition?: Yes    Copied from CRM #[id number]. Topic: Clinical - Red Word Triage >> May 25, 2024  7:35 AM Delon HERO wrote: Red Word that prompted transfer to Nurse Triage: Patient is calling to report pain on her her face the spread from the right to the left side to ear for 2 weeks. Yesterday reporting L & R ear pain. And left check. Woke up this morning with nausea- on her stomach. Reason for Disposition  [1] MODERATE pain (e.g., interferes with normal activities) AND [2] constant AND [3] present > 24 hours  Answer Assessment - Initial Assessment Questions 1. ONSET: When did the pain start? (e.g., minutes, hours, days)     2 weeks 2. ONSET: Does the pain come and go, or has it been constant since it started? (e.g., constant, intermittent, fleeting)     constant 3. SEVERITY: How bad is the pain? (Scale 1-10; mild, moderate or severe)     5-6 4. LOCATION: Where does it hurt?      Across nose and cheeks 5. RASH: Is there any redness, rash, or swelling of your face?     Cheeks get red 6. FEVER: Do you have a fever? If Yes, ask: What is it, how was it measured, and when did it start?      no 7. OTHER SYMPTOMS: Do you have any other symptoms? (e.g., fever, toothache, nasal discharge, nasal congestion, clicking sensation in jaw joint)     nausea 8. PREGNANCY: Is there any chance you are pregnant? When was your last menstrual period?     no  Protocols used: Face Pain-A-AH

## 2024-05-25 NOTE — Progress Notes (Signed)
 Subjective:  Patient ID: Valerie Baker, female    DOB: 1956-05-18, 68 y.o.   MRN: [id number]  Patient Care Team: Jolinda Norene HERO, DO as PCP - General (Family Medicine) Gust Royden ORN, MD (Orthopedic Surgery) Vernetta Lonni GRADE, MD as Consulting Physician (Orthopedic Surgery) Ernie Cough, MD as Consulting Physician (Orthopedic Surgery) Mahar, Elida, [place] as Physician Assistant (Orthopedic Surgery)   Chief Complaint:  facial pressure, Cough, and Ear Pain (Bilateral x 2 weeks )   HPI: Valerie Baker is a 68 y.o. female presenting on 05/25/2024 for facial pressure, Cough, and Ear Pain (Bilateral x 2 weeks )   Discussed the use of AI scribe software for clinical note transcription with the patient, who gave verbal consent to proceed.  History of Present Illness Valerie Baker is a 68 year old female who presents with left cheek pain radiating to both ears.  She experiences severe pain that began in her left cheek and has spread to both ears. The pain started over the weekend, particularly after being outside on Saturday. She describes the pain as severe, likening it to a sensation of barely running across her cheek. The ear pain started over the weekend and is less severe this morning.  She has a nighttime cough without sputum production.  An upcoming dental appointment is scheduled for the first week of November.  She participated in a fundraiser for her son's church, which involved baking and being outside, contributing to her busy schedule.      Relevant past medical, surgical, family, and social history reviewed and updated as indicated.  Allergies and medications reviewed and updated. Data reviewed: Chart in Epic.   Past Medical History:  Diagnosis Date   Adenomatous colon polyp 06/13/2006   Allergy    SINUS   Anxiety    Anxiety and depression    Arthritis    Asthma    Bladder incontinence    Blood transfusion    Cataract    Depression     Fibromyalgia    GERD (gastroesophageal reflux disease)    History of kidney stones    Hyperlipidemia    Kidney stones    Obesity    Osteopenia    Post-operative nausea and vomiting    Stress fracture of pelvis 11/03/2014    Past Surgical History:  Procedure Laterality Date   ABDOMINAL HYSTERECTOMY  1992   partial in 1994   APPENDECTOMY  1975   BACK SURGERY  2002   2002-2022 multi  x 6  fusions and rods   BREAST BIOPSY Bilateral 11/26/2017   FIBROCYSTIC CHANGES WITH CALCIFICATIONS    CERVICAL SPINE SURGERY  2014   with rod   COLONOSCOPY  06/12/2011   A[place]ita, 03/17/21 [facility]   ELBOW SURGERY  2003   EYE SURGERY Bilateral    cataract   FOOT SURGERY  1994   right   KIDNEY STONE SURGERY  2004   KNEE ARTHROSCOPY Left 2008   LUMBAR FUSION     2002,2003,2007,2008,2010, 2022 with fusion and rods   NASAL SINUS SURGERY  2003   NISSEN FUNDOPLICATION  2004   ROTATOR CUFF REPAIR Bilateral 8006,7996   SPINAL CORD STIMULATOR IMPLANT  2005   SPINAL CORD STIMULATOR IMPLANT  2004   SPINAL CORD STIMULATOR REMOVAL  2008   TOTAL KNEE ARTHROPLASTY Left 2008,2009   TOTAL KNEE ARTHROPLASTY Right 05/07/2022   Procedure: TOTAL KNEE ARTHROPLASTY;  Surgeon: Melodi Lerner, MD;  Location: [facility];  Service: Orthopedics;  Laterality:  Right;    Social History   Socioeconomic History   Marital status: Married    Spouse name: Not on file   Number of children: 3   Years of education: Not on file   Highest education level: Not on file  Occupational History   Occupation: Disabled  Tobacco Use   Smoking status: Former    Current packs/day: 0.00    Average packs/day: 2.0 packs/day for 5.0 years (10.0 ttl pk-yrs)    Types: Cigarettes    Start date: 11/02/2000    Quit date: 11/02/2005    Years since quitting: 18.5   Smokeless tobacco: Never  Vaping Use   Vaping status: Never Used  Substance and Sexual Activity   Alcohol  use: No   Drug use: No   Sexual activity: Yes  Other Topics Concern   Not on  file  Social History Narrative   Not on file   Social Drivers of Health   Financial Resource Strain: Low Risk  (01/16/2024)   Received from [facility]   Overall Financial Resource Strain (CARDIA)    Difficulty of Paying Living Expenses: Not hard at all  Food Insecurity: No Food Insecurity (01/16/2024)   Received [facility]   Hunger Vital Sign    Within the past 12 months, you worried that your food would run out before you got the money to buy more.: Never true    Within the past 12 months, the food you bought just didn't last and you didn't have money to get more.: Never true  Transportation Needs: No Transportation Needs (01/16/2024)   Received from [facility] - Transportation    Lack of Transportation (Medical): No    Lack of Transportation (Non-Medical): No  Physical Activity: Insufficiently Active (07/01/2023)   Exercise Vital Sign    Days of Exercise per Week: 2 days    Minutes of Exercise per Session: 20 min  Stress: No Stress Concern Present (07/01/2023)   [employer] of Occupational Health - Occupational Stress Questionnaire    Feeling of Stress : Not at all  Social Connections: Moderately Integrated (07/01/2023)   Social Connection and Isolation Panel    Frequency of Communication with Friends and Family: More than three times a week    Frequency of Social Gatherings with Friends and Family: More than three times a week    Attends Religious Services: More than 4 times per year    Active Member of [employer] or Organizations: No    Attends Banker Meetings: Never    Marital Status: Married  Catering manager Violence: Not At Risk (07/01/2023)   Humiliation, Afraid, Rape, and Kick questionnaire    Fear of Current or Ex-Partner: No    Emotionally Abused: No    Physically Abused: No    Sexually Abused: No    Outpatient Encounter Medications as of 05/25/2024  Medication Sig   albuterol  (VENTOLIN  HFA) 108 (90 Base) MCG/ACT inhaler Inhale  2 puffs into the lungs every 6 (six) hours as needed for wheezing or shortness of breath.   amoxicillin  (AMOXIL ) 875 MG tablet Take 1 tablet (875 mg total) by mouth 2 (two) times daily for 10 days.   ascorbic acid (VITAMIN C) 500 MG tablet Take 500 mg by mouth daily.   baclofen  (LIORESAL ) 10 MG tablet Take 0.5-1 tablets (5-10 mg total) by mouth 3 (three) times daily as needed for muscle spasms.   buPROPion  (WELLBUTRIN  SR) 150 MG 12 hr tablet Take 1 tablet (150  mg total) by mouth in the morning.   Calcium Carbonate-Vitamin D  600-400 MG-UNIT tablet Take 1 tablet by mouth 2 (two) times daily.   Cholecalciferol (VITAMIN D3) 2000 UNITS TABS Take 2,000 Units by mouth 2 (two) times daily.   cycloSPORINE  (RESTASIS ) 0.05 % ophthalmic emulsion Place 1 drop into both eyes 2 (two) times daily.   desloratadine  (CLARINEX ) 5 MG tablet Take 1 tablet (5 mg total) by mouth daily.   DULoxetine  (CYMBALTA ) 30 MG capsule Take 1 capsule (30 mg total) by mouth 2 (two) times daily.   fluocinonide  gel (LIDEX ) 0.05 % Apply 0.5gm to affected areas as directed   fluticasone  (FLONASE ) 50 MCG/ACT nasal spray Place 2 sprays into both nostrils at bedtime.   gabapentin  (NEURONTIN ) 400 MG capsule Take 2 capsules (800 mg total) by mouth 3 (three) times daily.   guaifenesin  (HUMIBID E) 400 MG TABS tablet Take 1 tablet (400 mg total) by mouth every 6 (six) hours as needed.   levothyroxine (SYNTHROID) 25 MCG tablet Take 1 tablet (25 mcg total) by mouth daily before breakfast. Separate food/ drink/ meds by at least 30 mins after taking.   meloxicam  (MOBIC ) 15 MG tablet Take 0.5-1 tablets (7.5-15 mg total) by mouth daily as needed for pain.   Multiple Vitamin (MULTIVITAMIN) tablet Take 1 tablet by mouth daily.   omeprazole  (PRILOSEC) 40 MG capsule Take 1 capsule (40 mg total) by mouth 2 (two) times daily.   OVER THE COUNTER MEDICATION Fish Oil-Take 2 capsule by mouth daily.   OVER THE COUNTER MEDICATION Zinc 50mg -Take 1 capsule by mouth  daily.   oxybutynin  (DITROPAN ) 5 MG tablet Take 1 tablet (5 mg total) by mouth 2 (two) times daily.   oxyCODONE -acetaminophen  (PERCOCET) 7.5-325 MG tablet Take 1 tablet by mouth 2 (two) times daily as needed for severe pain.   Povidone, PF, (IVIZIA DRY EYES) 0.5 % SOLN Place 1 drop into both eyes every evening.   traZODone  (DESYREL ) 100 MG tablet Take 1 tablet (100 mg total) by mouth at bedtime as needed for sleep.   zinc gluconate 50 MG tablet Take 50 mg by mouth daily.   [DISCONTINUED] ondansetron  (ZOFRAN -ODT) 4 MG disintegrating tablet Take 1 tablet (4 mg total) by mouth every 8 (eight) hours as needed for nausea or vomiting.   No facility-administered encounter medications on file as of 05/25/2024.    Allergies  Allergen Reactions   Latex Itching, Rash and Hives   Sulfonamide Derivatives Dermatitis    Pertinent ROS per HPI, otherwise unremarkable      Objective:  BP 125/73   Pulse 66   Temp (!) 97.3 F (36.3 C)   Ht 5' 4 (1.626 m)   Wt 206 lb (93.4 kg)   SpO2 98%   BMI 35.36 kg/m    Wt Readings from Last 3 Encounters:  05/25/24 206 lb (93.4 kg)  05/20/24 206 lb 6 oz (93.6 kg)  12/21/23 212 lb (96.2 kg)    Physical Exam Vitals and nursing note reviewed.  Constitutional:      General: She is not in acute distress. HENT:     Head: Normocephalic.     Right Ear: Hearing, tympanic membrane, ear canal and external ear normal.     Left Ear: Swelling and tenderness present.     Nose: Congestion present.     Mouth/Throat:     Mouth: Mucous membranes are moist.  Eyes:     General: No scleral icterus.    Extraocular Movements: Extraocular movements intact.  Conjunctiva/sclera: Conjunctivae normal.     Pupils: Pupils are equal, round, and reactive to light.  Cardiovascular:     Heart sounds: Normal heart sounds.  Pulmonary:     Effort: Pulmonary effort is normal.     Breath sounds: Normal breath sounds.  Musculoskeletal:        General: Normal range of motion.      Right lower leg: No edema.     Left lower leg: No edema.  Skin:    General: Skin is warm and dry.     Findings: No rash.  Neurological:     Mental Status: She is alert and oriented to person, place, and time.  Psychiatric:        Mood and Affect: Mood normal.        Behavior: Behavior normal.        Thought Content: Thought content normal.        Judgment: Judgment normal.    Physical Exam HEENT: Left nasal turbinate inflamed. Left ear infection. CHEST: Lungs clear to auscultation bilaterally.     Results for orders placed or performed in visit on 05/20/24  Anemia Profile B   Collection Time: 05/20/24  9:55 AM  Result Value Ref Range   Total Iron Binding Capacity 318 250 - 450 ug/dL   UIBC 750 881 - 630 ug/dL   Iron 69 27 - 860 ug/dL   Iron Saturation 22 15 - 55 %   Ferritin 34 15 - 150 ng/mL   Vitamin B-12 638 232 - 1,245 pg/mL   Folate 19.7 >3.0 ng/mL   WBC 5.7 3.4 - 10.8 x10E3/uL   RBC 3.99 3.77 - 5.28 x10E6/uL   Hemoglobin 12.3 11.1 - 15.9 g/dL   Hematocrit 61.8 65.9 - 46.6 %   MCV 96 79 - 97 fL   MCH 30.8 26.6 - 33.0 pg   MCHC 32.3 31.5 - 35.7 g/dL   RDW 86.7 88.2 - 84.5 %   Platelets 239 150 - 450 x10E3/uL   Neutrophils 52 Not Estab. %   Lymphs 32 Not Estab. %   Monocytes 8 Not Estab. %   Eos 7 Not Estab. %   Basos 1 Not Estab. %   Neutrophils Absolute 3.0 1.4 - 7.0 x10E3/uL   Lymphocytes Absolute 1.8 0.7 - 3.1 x10E3/uL   Monocytes Absolute 0.5 0.1 - 0.9 x10E3/uL   EOS (ABSOLUTE) 0.4 0.0 - 0.4 x10E3/uL   Basophils Absolute 0.1 0.0 - 0.2 x10E3/uL   Immature Granulocytes 0 Not Estab. %   Immature Grans (Abs) 0.0 0.0 - 0.1 x10E3/uL   Retic Ct Pct 1.8 0.6 - 2.6 %  Lipid Panel   Collection Time: 05/20/24  9:55 AM  Result Value Ref Range   Cholesterol, Total 223 (H) 100 - 199 mg/dL   Triglycerides 78 0 - 149 mg/dL   HDL 74 >60 mg/dL   VLDL Cholesterol Cal 14 5 - 40 mg/dL   LDL Chol Calc (NIH) 864 (H) 0 - 99 mg/dL   Chol/HDL Ratio 3.0 0.0 - 4.4 ratio   TSH + free T4   Collection Time: 05/20/24  9:55 AM  Result Value Ref Range   TSH 3.080 0.450 - 4.500 uIU/mL   Free T4 0.98 0.82 - 1.77 ng/dL       Pertinent labs & imaging results that were available during my care of the patient were reviewed by me and considered in my medical decision making.  Assessment & Plan:  Valerie Baker was seen today for facial  pressure, cough and ear pain.  Diagnoses and all orders for this visit:  Acute non-recurrent maxillary sinusitis -     amoxicillin  (AMOXIL ) 875 MG tablet; Take 1 tablet (875 mg total) by mouth 2 (two) times daily for 10 days.  Acute left otitis media -     amoxicillin  (AMOXIL ) 875 MG tablet; Take 1 tablet (875 mg total) by mouth 2 (two) times daily for 10 days.     Assessment and Plan Valerie Baker is a 68 year old Caucasian female seen today for otitis media, no acute distress Assessment & Plan Acute left otitis media and left maxillary sinusitis Acute left otitis media and left maxillary sinusitis causing left cheek and ear pain, with nasal inflammation indicating sinus involvement. - Prescribed amoxicillin  500 mg, one tablet twice daily for seven days. - Advised to save six tablets of amoxicillin  for dental prophylaxis.  Cough Intermittent nocturnal cough without sputum.  Antibiotic prophylaxis for dental procedure due to bilateral knee prostheses Requires amoxicillin  prophylaxis prior to dental procedure due to bilateral knee prostheses. - Instructed to take four tablets of amoxicillin  as a single dose prior to dental appointment.      Continue all other maintenance medications.  Follow up plan: Return if symptoms worsen or fail to improve.   Continue healthy lifestyle choices, including diet (rich in fruits, vegetables, and lean proteins, and low in salt and simple carbohydrates) and exercise (at least 30 minutes of moderate physical activity daily).  Educational handout given for    Clinical References  How to Perform  a Sinus Rinse A sinus rinse is a home treatment that is used to rinse your sinuses with a germ-free (sterile) mixture of salt and water  (saline solution). Sinuses are air-filled spaces in your skull that are behind the bones of your face and forehead. They open into your nasal cavity. A sinus rinse can help to clear mucus, dirt, dust, or pollen from your nasal cavity. You may do a sinus rinse when you have a cold, a virus, nasal allergy symptoms, a sinus infection, or stuffiness in your nose or sinuses. What are the risks? A sinus rinse is generally safe and effective. However, there are a few risks, which include: A burning sensation in your sinuses. This may happen if you do not make the saline solution as directed. Be sure to follow all directions when making the saline solution. Nasal irritation. Infection. This may be from unclean supplies or from contaminated water . Infection from contaminated water  is rare, but possible. Do not do a sinus rinse if you have had ear or nasal surgery, ear infection, or plugged ears, unless recommended by your health care provider. Supplies needed: Saline solution or powder. Distilled or sterile water  to mix with saline powder. You may use boiled and cooled tap water . Boil tap water  for 5 minutes; cool until it is lukewarm. Use within 24 hours. Do not use regular tap water  to mix with the saline solution. Neti pot or nasal rinse bottle. These supplies release the saline solution into your nose and through your sinuses. Neti pots and nasal rinse bottles can be purchased at [employer], a health food store, or online. How to perform a sinus rinse  Wash your hands with soap and water  for at least 20 seconds. If soap and water  are not available, use hand sanitizer. Wash your device according to the directions that came with the product and then dry it. Use the solution that comes with your product or one that is sold  separately in stores. Follow the mixing  directions on the package to mix with sterile or distilled water . Fill the device with the amount of saline solution noted in the device instructions. Stand by a sink and tilt your head sideways over the sink. Place the spout of the device in your upper nostril (the one closer to the ceiling). Gently pour or squeeze the saline solution into your nasal cavity. The liquid should drain out from the lower nostril if you are not too congested. While rinsing, breathe through your open mouth. Gently blow your nose to clear any mucus and rinse solution. Blowing too hard may cause ear pain. Turn your head in the other direction and repeat in your other nostril. Clean and rinse your device with clean water  and then air-dry it. Talk with your health care provider or pharmacist if you have questions about how to do a sinus rinse. Summary A sinus rinse is a home treatment that is used to rinse your sinuses with a sterile mixture of salt and water  (saline solution). You may do a sinus rinse when you have a cold, a virus, nasal allergy symptoms, a sinus infection, or stuffiness in your nose or sinuses. A sinus rinse is generally safe and effective. Follow all instructions carefully. This information is not intended to replace advice given to you by your health care provider. Make sure you discuss any questions you have with your health care provider. Document Revised: 01/16/2021 Document Reviewed: 01/16/2021 Elsevier Patient Education  2024 Elsevier Inc. Sinus Infection, Adult A sinus infection is soreness and swelling (inflammation) of your sinuses. Sinuses are hollow spaces in the bones around your face. They are located: Around your eyes. In the middle of your forehead. Behind your nose. In your cheekbones. Your sinuses and nasal passages are lined with a fluid called mucus. Mucus drains out of your sinuses. Swelling can trap mucus in your sinuses. This lets germs (bacteria, virus, or fungus) grow, which  leads to infection. Most of the time, this condition is caused by a virus. What are the causes? Allergies. Asthma. Germs. Things that block your nose or sinuses. Growths in the nose (nasal polyps). Chemicals or irritants in the air. A fungus. This is rare. What increases the risk? Having a weak body defense system (immune system). Doing a lot of swimming or diving. Using nasal sprays too much. Smoking. What are the signs or symptoms? The main symptoms of this condition are pain and a feeling of pressure around the sinuses. Other symptoms include: Stuffy nose (congestion). This may make it hard to breathe through your nose. Runny nose (drainage). Soreness, swelling, and warmth in the sinuses. A cough that may get worse at night. Being unable to smell and taste. Mucus that collects in the throat or the back of the nose (postnasal drip). This may cause a sore throat or bad breath. Being very tired (fatigued). A fever. How is this diagnosed? Your symptoms. Your medical history. A physical exam. Tests to find out if your condition is short-term (acute) or long-term (chronic). Your doctor may: Check your nose for growths (polyps). Check your sinuses using a tool that has a light on one end (endoscope). Check for allergies or germs. Do imaging tests, such as an MRI or CT scan. How is this treated? Treatment for this condition depends on the cause and whether it is short-term or long-term. If caused by a virus, your symptoms should go away on their own within 10 days. You may be given  medicines to relieve symptoms. They include: Medicines that shrink swollen tissue in the nose. A spray that treats swelling of the nostrils. Rinses that help get rid of thick mucus in your nose (nasal saline washes). Medicines that treat allergies (antihistamines). Over-the-counter pain relievers. If caused by bacteria, your doctor may wait to see if you will get better without treatment. You may be  given antibiotic medicine if you have: A very bad infection. A weak body defense system. If caused by growths in the nose, surgery may be needed. Follow these instructions at home: Medicines Take, use, or apply over-the-counter and prescription medicines only as told by your doctor. These may include nasal sprays. If you were prescribed an antibiotic medicine, take it as told by your doctor. Do not stop taking it even if you start to feel better. Hydrate and humidify  Drink enough water  to keep your pee (urine) pale yellow. Use a cool mist humidifier to keep the humidity level in your home above 50%. Breathe in steam for 10-15 minutes, 3-4 times a day, or as told by your doctor. You can do this in the bathroom while a hot shower is running. Try not to spend time in cool or dry air. Rest Rest as much as you can. Sleep with your head raised (elevated). Make sure you get enough sleep each night. General instructions  Put a warm, moist washcloth on your face 3-4 times a day, or as often as told by your doctor. Use nasal saline washes as often as told by your doctor. Wash your hands often with soap and water . If you cannot use soap and water , use hand sanitizer. Do not smoke. Avoid being around people who are smoking (secondhand smoke). Keep all follow-up visits. Contact a doctor if: You have a fever. Your symptoms get worse. Your symptoms do not get better within 10 days. Get help right away if: You have a very bad headache. You cannot stop vomiting. You have very bad pain or swelling around your face or eyes. You have trouble seeing. You feel confused. Your neck is stiff. You have trouble breathing. These symptoms may be an emergency. Get help right away. Call 911. Do not wait to see if the symptoms will go away. Do not drive yourself to the hospital. Summary A sinus infection is swelling of your sinuses. Sinuses are hollow spaces in the bones around your face. This condition  is caused by tissues in your nose that become inflamed or swollen. This traps germs. These can lead to infection. If you were prescribed an antibiotic medicine, take it as told by your doctor. Do not stop taking it even if you start to feel better. Keep all follow-up visits. This information is not intended to replace advice given to you by your health care provider. Make sure you discuss any questions you have with your health care provider. Document Revised: 07/04/2021 Document Reviewed: 07/04/2021 Elsevier Patient Education  2024 Elsevier Inc. Otitis Media With Effusion, Adult  Otitis media with effusion (OME) is inflammation and fluid (effusion) in the middle ear without having an ear infection. The middle ear is the space behind the eardrum. The middle ear is connected to the back of the throat by a narrow tube (eustachian tube). Normally the eustachian tube drains fluid out of the middle ear. A swollen eustachian tube can become blocked and cause fluid to collect in the middle ear. OME often goes away without treatment. Sometimes OME can lead to hearing problems and recurrent acute  ear infections (acute otitis media). These conditions may require treatment. What are the causes? OME is caused by a blocked eustachian tube. This can result from: Allergies. Upper respiratory infections. Enlarged adenoids. The adenoids are areas of soft tissue located high in the back of the throat, behind the nose and the roof of the mouth. They are part of the body's natural defense system (immune system). Rapid changes in pressure, like when an airplane is descending or during scuba diving. In some cases, the cause of this condition is not known. What are the signs or symptoms? Common symptoms of this condition include: A feeling of fullness in your ear. Decreased hearing in the affected ear. Fluid draining into the ear canal. Pain in the ear. In some cases, there are no symptoms. How is this  diagnosed?  A health care provider can diagnose OME based on signs and symptoms of the condition. Your provider will also do a physical exam to check for fluid behind the eardrum. During the exam, your health care provider will use an instrument called an otoscope to look in your ear. Your health care provider may do other tests, such as: A hearing test. A tympanogram. This is a test that shows how well the eardrum moves in response to air pressure in the ear canal. It provides a graph for your health care provider to review. A pneumatic otoscopy. This is a test to check how your eardrum moves in response to changes in pressure. It is done by squeezing a small amount of air into the ear. How is this treated? Treatment for OME depends on the cause of the condition and the severity of symptoms. The first step is often waiting to see if the fluid drains on its own in a few weeks. Home care treatment may include: Over-the-counter pain relievers. A warm, moist cloth placed over the ear. Severe cases may require a procedure to insert tubes in the ears (tympanostomy tubes) to drain the fluid. Follow these instructions at home: Take over-the-counter and prescription medicines only as told by your health care provider. Keep all follow-up visits. Contact a health care provider if: You have pain that gets worse. Hearing in your affected ear gets worse. You have fluid draining from your ear canal. You have dizziness. You develop a fever. Get help right away if: You develop a severe headache. You completely lose hearing in the affected ear. You have bleeding from your ear canal. You have sudden and severe pain in your ear. These symptoms may represent a serious problem that is an emergency. Do not wait to see if the symptoms will go away. Get medical help right away. Call your local emergency services (911 in the [place] Do not drive yourself to the hospital. Summary Otitis media with effusion (OME) is  inflammation and fluid (effusion) in the middle ear without having an ear infection. A swollen eustachian tube can become blocked and cause fluid to collect in the middle ear. Treatment for OME depends on the cause of the condition and the severity of symptoms. Many times, treatment is not needed because the fluid drains on its own in a few weeks. Sometimes OME can lead to hearing problems and recurrent acute ear infections (acute otitis media), which may require treatment. This information is not intended to replace advice given to you by your health care provider. Make sure you discuss any questions you have with your health care provider. Document Revised: 11/24/2020 Document Reviewed: 11/24/2020 Elsevier Patient Education  2024  [employer].  The above assessment and management plan was discussed with the patient. The patient verbalized understanding of and has agreed to the management plan. Patient is aware to call the clinic if they develop any new symptoms or if symptoms persist or worsen. Patient is aware when to return to the clinic for a follow-up visit. Patient educated on when it is appropriate to go to the emergency department.   Nazanin Kinner [place] Thompson, DNP [facility] [address] [place], [place] [address] [phone]

## 2024-05-27 ENCOUNTER — Telehealth: Payer: Self-pay

## 2024-05-27 ENCOUNTER — Other Ambulatory Visit: Payer: Self-pay | Admitting: Nurse Practitioner

## 2024-05-27 MED ORDER — FLUCONAZOLE 150 MG PO TABS: 150.0000 mg | ORAL_TABLET | Freq: Once | ORAL | 0 refills | Status: AC

## 2024-05-27 NOTE — Telephone Encounter (Signed)
 Patient saw Nena Deitra Morton Sebastian on 10/13 and was prescribed Amoxicillin . Nena, can you send in medication for yeast infection?   Copied from CRM [id number]. Topic: Clinical - Medical Advice >> May 27, 2024  9:18 AM Alfonso ORN wrote: Reason for CRM: patient forgot to let Norene Fielding know  that whenever she takes antibotic amoxicillin  , patient gets yeast infection and would like to get some medication for it , patient  have a yeast infection in mouth and private area

## 2024-05-27 NOTE — Progress Notes (Signed)
 She was seen and treated with amoxicillin  and: Report yeast infection post antibiotic treatment 1 dose of Diflucan  sent to her pharmacy

## 2024-05-28 NOTE — Telephone Encounter (Signed)
 Left detailed message making patient aware that Rx for yeast infection was approved and sent to pharmacy.

## 2024-06-01 ENCOUNTER — Ambulatory Visit: Payer: Self-pay

## 2024-06-01 NOTE — Telephone Encounter (Signed)
 FYI Only or Action Required?: FYI only for provider.  Patient was last seen in primary care on 05/25/2024 by Deitra Morton Sebastian Nena, NP.  Called Nurse Triage reporting Leg Pain.  Symptoms began several weeks ago.  Interventions attempted: Rest, hydration, or home remedies.  Symptoms are: gradually worsening.  Triage Disposition: See PCP When Office is Open (Within 3 Days)  Patient/caregiver understands and will follow disposition?: Yes  Copied from CRM [id number]. Topic: Clinical - Red Word Triage >> Jun 01, 2024 12:15 PM Rachelle R wrote: [facility] that prompted transfer to Nurse Triage: Patients is having pain in her right leg from the ankle going up. States when she goes from sitting to standing feels like her leg might break. Pain is worse when walking. Reason for Disposition  [1] MODERATE pain (e.g., interferes with normal activities, limping) AND [2] present > 3 days  Answer Assessment - Initial Assessment Questions For the last 2 weeks patient has had right leg pain, from the ankle to about 5 inches below her knee. It was noticeably swollen last week but has since resolved. Denies hot, hard knot, redness or color/temp changes in extremitiy. Denies Fever, CP, SOB, or dizziness. Feels like its her bone hurting. Denies hx of DVT, PE, MI- states she has had superficial clots before that feel like a bee-sting and then it swells and burst.- Denies blood thinner use. Pain when up walking, sharp and stabbing 8/10. And 4-5/10 at rest.  ED/UC/Callback instructions given. Appt 10/21 to assess  1. ONSET: When did the pain start?      Close to two weeks 2. LOCATION: Where is the pain located?      Right ankle to about 5-6inches below knee 3. PAIN: How bad is the pain?    (Scale 1-10; or mild, moderate, severe)     8/10- shooting pain when standing, when resting 4-5/10 4. WORK OR EXERCISE: Has there been any recent work or exercise that involved this part of the body?      Denies  injury  5. CAUSE: What do you think is causing the leg pain?     unsure 6. OTHER SYMPTOMS: Do you have any other symptoms? (e.g., chest pain, back pain, breathing difficulty, swelling, rash, fever, numbness, weakness)     Swelling that has resolved.  Protocols used: Leg Pain-A-AH

## 2024-06-01 NOTE — Telephone Encounter (Signed)
 Noted

## 2024-06-02 ENCOUNTER — Other Ambulatory Visit: Payer: Self-pay | Admitting: Nurse Practitioner

## 2024-06-02 ENCOUNTER — Ambulatory Visit (INDEPENDENT_AMBULATORY_CARE_PROVIDER_SITE_OTHER): Admitting: Nurse Practitioner

## 2024-06-02 ENCOUNTER — Encounter: Payer: Self-pay | Admitting: Nurse Practitioner

## 2024-06-02 ENCOUNTER — Ambulatory Visit (INDEPENDENT_AMBULATORY_CARE_PROVIDER_SITE_OTHER)

## 2024-06-02 VITALS — BP 135/70 | HR 62 | Temp 97.1°F | Ht 64.0 in | Wt 208.4 lb

## 2024-06-02 DIAGNOSIS — M79604 Pain in right leg: Secondary | ICD-10-CM

## 2024-06-02 DIAGNOSIS — M8580 Other specified disorders of bone density and structure, unspecified site: Secondary | ICD-10-CM

## 2024-06-02 DIAGNOSIS — M1711 Unilateral primary osteoarthritis, right knee: Secondary | ICD-10-CM | POA: Diagnosis not present

## 2024-06-02 DIAGNOSIS — M7730 Calcaneal spur, unspecified foot: Secondary | ICD-10-CM | POA: Diagnosis not present

## 2024-06-02 DIAGNOSIS — M79669 Pain in unspecified lower leg: Secondary | ICD-10-CM | POA: Diagnosis not present

## 2024-06-02 MED ORDER — DICLOFENAC SODIUM 1 % EX GEL: 2.0000 g | Freq: Four times a day (QID) | CUTANEOUS | 2 refills | Status: AC

## 2024-06-02 NOTE — Progress Notes (Signed)
 Subjective:  Patient ID: Valerie Baker, female    DOB: [birth year], 68 y.o.   MRN: [id number]  Patient Care Team: Jolinda Norene HERO, DO as PCP - General (Family Medicine) Gust Royden ORN, MD (Orthopedic Surgery) Vernetta Lonni GRADE, MD as Consulting Physician (Orthopedic Surgery) Ernie Cough, MD as Consulting Physician (Orthopedic Surgery) Mahar, Elida, [place] as Physician Assistant (Orthopedic Surgery)   Chief Complaint:  Leg Pain (Right lower leg pain for 2 weeks, swelling )   HPI: Valerie Baker is a 68 y.o. female presenting on 06/02/2024 for Leg Pain (Right lower leg pain for 2 weeks, swelling )   Discussed the use of AI scribe software for clinical note transcription with the patient, who gave verbal consent to proceed.  History of Present Illness Valerie Baker is a 68 year old female with osteoarthritis who presents with right leg pain and swelling.  She has been experiencing pain in her right leg, specifically in the calf area above the ankle, for the past two weeks. Initially, the pain occurred when she got up, but it now also occurs when she walks. The pain is described as stabbing and is rated as a 10 out of 10 when standing. The pain does not radiate and remains localized to the calf area.  She also reports swelling in her legs, particularly when she is on her feet for extended periods, such as when cooking or attending church. The swelling is significant enough to leave indentations from her socks. Both legs swell, but the pain is only in the right leg. She has not experienced similar symptoms in the past.  Her past medical history includes osteoarthritis of the right knee, for which she underwent knee replacement surgery a little over two years ago. She also has a history of osteopenia and a right foot fracture approximately 25-30 years ago. She denies any recent trauma to the leg or ankle.  In terms of current management, she has used ice with  minimal relief and has previously used Voltaren  gel, which she found helpful but currently does not have any. No shortness of breath or chest pain. Her sinuses are much improved.      Relevant past medical, surgical, family, and social history reviewed and updated as indicated.  Allergies and medications reviewed and updated. Data reviewed: Chart in Epic.   Past Medical History:  Diagnosis Date   Adenomatous colon polyp 06/13/2006   Allergy    SINUS   Anxiety    Anxiety and depression    Arthritis    Asthma    Bladder incontinence    Blood transfusion    Cataract    Depression    Fibromyalgia    GERD (gastroesophageal reflux disease)    History of kidney stones    Hyperlipidemia    Kidney stones    Obesity    Osteopenia    Post-operative nausea and vomiting    Stress fracture of pelvis 11/03/2014    Past Surgical History:  Procedure Laterality Date   ABDOMINAL HYSTERECTOMY  1992   partial in 1994   APPENDECTOMY  1975   BACK SURGERY  2002   2002-2022 multi  x 6  fusions and rods   BREAST BIOPSY Bilateral 11/26/2017   FIBROCYSTIC CHANGES WITH CALCIFICATIONS    CERVICAL SPINE SURGERY  2014   with rod   COLONOSCOPY  06/12/2011   [facility], 03/17/21 [facility]   ELBOW SURGERY  2003   EYE SURGERY Bilateral    cataract  FOOT SURGERY  1994   right   KIDNEY STONE SURGERY  2004   KNEE ARTHROSCOPY Left 2008   LUMBAR FUSION     2002,2003,2007,2008,2010, 2022 with fusion and rods   NASAL SINUS SURGERY  2003   NISSEN FUNDOPLICATION  2004   ROTATOR CUFF REPAIR Bilateral 8006,7996   SPINAL CORD STIMULATOR IMPLANT  2005   SPINAL CORD STIMULATOR IMPLANT  2004   SPINAL CORD STIMULATOR REMOVAL  2008   TOTAL KNEE ARTHROPLASTY Left 2008,2009   TOTAL KNEE ARTHROPLASTY Right 05/07/2022   Procedure: TOTAL KNEE ARTHROPLASTY;  Surgeon: Melodi Lerner, MD;  Location: [facility];  Service: Orthopedics;  Laterality: Right;    Social History   Socioeconomic History   Marital status: Married     Spouse name: Not on file   Number of children: 3   Years of education: Not on file   Highest education level: Not on file  Occupational History   Occupation: Disabled  Tobacco Use   Smoking status: Former    Current packs/day: 0.00    Average packs/day: 2.0 packs/day for 5.0 years (10.0 ttl pk-yrs)    Types: Cigarettes    Start date: 11/02/2000    Quit date: 11/02/2005    Years since quitting: 18.5   Smokeless tobacco: Never  Vaping Use   Vaping status: Never Used  Substance and Sexual Activity   Alcohol  use: No   Drug use: No   Sexual activity: Yes  Other Topics Concern   Not on file  Social History Narrative   Not on file   Social Drivers of Health   Financial Resource Strain: Low Risk  (01/16/2024)   Received from [facility]   Overall Financial Resource Strain (CARDIA)    Difficulty of Paying Living Expenses: Not hard at all  Food Insecurity: No Food Insecurity (01/16/2024)   Received from [facility]   Hunger Vital Sign    Within the past 12 months, you worried that your food would run out before you got the money to buy more.: Never true    Within the past 12 months, the food you bought just didn't last and you didn't have money to get more.: Never true  Transportation Needs: No Transportation Needs (01/16/2024)   [facility] - Transportation    Lack of Transportation (Medical): No    Lack of Transportation (Non-Medical): No  Physical Activity: Insufficiently Active (07/01/2023)   Exercise Vital Sign    Days of Exercise per Week: 2 days    Minutes of Exercise per Session: 20 min  Stress: No Stress Concern Present (07/01/2023)   [employer] of Occupational Health - Occupational Stress Questionnaire    Feeling of Stress : Not at all  Social Connections: Moderately Integrated (07/01/2023)   Social Connection and Isolation Panel    Frequency of Communication with Friends and Family: More than three times a week    Frequency of Social  Gatherings with Friends and Family: More than three times a week    Attends Religious Services: More than 4 times per year    Active Member of [employer] or Organizations: No    Attends Banker Meetings: Never    Marital Status: Married  Catering manager Violence: Not At Risk (07/01/2023)   Humiliation, Afraid, Rape, and Kick questionnaire    Fear of Current or Ex-Partner: No    Emotionally Abused: No    Physically Abused: No    Sexually Abused: No  Outpatient Encounter Medications as of 06/02/2024  Medication Sig   albuterol  (VENTOLIN  HFA) 108 (90 Base) MCG/ACT inhaler Inhale 2 puffs into the lungs every 6 (six) hours as needed for wheezing or shortness of breath.   ascorbic acid (VITAMIN C) 500 MG tablet Take 500 mg by mouth daily.   baclofen  (LIORESAL ) 10 MG tablet Take 0.5-1 tablets (5-10 mg total) by mouth 3 (three) times daily as needed for muscle spasms.   buPROPion  (WELLBUTRIN  SR) 150 MG 12 hr tablet Take 1 tablet (150 mg total) by mouth in the morning.   Calcium Carbonate-Vitamin D  600-400 MG-UNIT tablet Take 1 tablet by mouth 2 (two) times daily.   Cholecalciferol (VITAMIN D3) 2000 UNITS TABS Take 2,000 Units by mouth 2 (two) times daily.   cycloSPORINE  (RESTASIS ) 0.05 % ophthalmic emulsion Place 1 drop into both eyes 2 (two) times daily.   desloratadine  (CLARINEX ) 5 MG tablet Take 1 tablet (5 mg total) by mouth daily.   diclofenac  Sodium (VOLTAREN  ARTHRITIS PAIN) 1 % GEL Apply 2 g topically 4 (four) times daily.   DULoxetine  (CYMBALTA ) 30 MG capsule Take 1 capsule (30 mg total) by mouth 2 (two) times daily.   fluocinonide  gel (LIDEX ) 0.05 % Apply 0.5gm to affected areas as directed   fluticasone  (FLONASE ) 50 MCG/ACT nasal spray Place 2 sprays into both nostrils at bedtime.   gabapentin  (NEURONTIN ) 400 MG capsule Take 2 capsules (800 mg total) by mouth 3 (three) times daily.   guaifenesin  (HUMIBID E) 400 MG TABS tablet Take 1 tablet (400 mg total) by mouth every 6  (six) hours as needed.   levothyroxine (SYNTHROID) 25 MCG tablet Take 1 tablet (25 mcg total) by mouth daily before breakfast. Separate food/ drink/ meds by at least 30 mins after taking.   meloxicam  (MOBIC ) 15 MG tablet Take 0.5-1 tablets (7.5-15 mg total) by mouth daily as needed for pain.   Multiple Vitamin (MULTIVITAMIN) tablet Take 1 tablet by mouth daily.   omeprazole  (PRILOSEC) 40 MG capsule Take 1 capsule (40 mg total) by mouth 2 (two) times daily.   OVER THE COUNTER MEDICATION Fish Oil-Take 2 capsule by mouth daily.   OVER THE COUNTER MEDICATION Zinc 50mg -Take 1 capsule by mouth daily.   oxybutynin  (DITROPAN ) 5 MG tablet Take 1 tablet (5 mg total) by mouth 2 (two) times daily.   oxyCODONE -acetaminophen  (PERCOCET) 7.5-325 MG tablet Take 1 tablet by mouth 2 (two) times daily as needed for severe pain.   Povidone, PF, (IVIZIA DRY EYES) 0.5 % SOLN Place 1 drop into both eyes every evening.   traZODone  (DESYREL ) 100 MG tablet Take 1 tablet (100 mg total) by mouth at bedtime as needed for sleep.   zinc gluconate 50 MG tablet Take 50 mg by mouth daily.   amoxicillin  (AMOXIL ) 875 MG tablet Take 1 tablet (875 mg total) by mouth 2 (two) times daily for 10 days. (Patient not taking: Reported on 06/02/2024)   No facility-administered encounter medications on file as of 06/02/2024.    Allergies  Allergen Reactions   Latex Itching, Rash and Hives   Sulfonamide Derivatives Dermatitis    Pertinent ROS per HPI, otherwise unremarkable      Objective:  BP 135/70   Pulse 62   Temp (!) 97.1 F (36.2 C) (Temporal)   Ht 5' 4 (1.626 m)   Wt 208 lb 6.4 oz (94.5 kg)   SpO2 94%   BMI 35.77 kg/m    Wt Readings from Last 3 Encounters:  06/02/24 208 lb  6.4 oz (94.5 kg)  05/25/24 206 lb (93.4 kg)  05/20/24 206 lb 6 oz (93.6 kg)    Physical Exam Vitals and nursing note reviewed.  Constitutional:      General: She is not in acute distress.    Appearance: She is obese.  HENT:     Head:  Normocephalic and atraumatic.     Nose: Nose normal.  Eyes:     General: No scleral icterus.    Extraocular Movements: Extraocular movements intact.     Conjunctiva/sclera: Conjunctivae normal.     Pupils: Pupils are equal, round, and reactive to light.  Cardiovascular:     Heart sounds: Normal heart sounds.  Pulmonary:     Effort: Pulmonary effort is normal.     Breath sounds: Normal breath sounds.  Musculoskeletal:     Right lower leg: Tenderness present. No edema.     Left lower leg: No edema.     Right ankle: Normal. No swelling. Normal range of motion.     Left ankle: Normal. No swelling. Normal range of motion.       Legs:     Comments: Calf tenderness with dorsiflexion  Skin:    General: Skin is warm and dry.  Neurological:     Mental Status: She is alert and oriented to person, place, and time.  Psychiatric:        Mood and Affect: Mood normal.        Behavior: Behavior normal.        Thought Content: Thought content normal.        Judgment: Judgment normal.    Physical Exam EXTREMITIES: Good pulse in extremities. No inflammation in right leg, warm. No pain with plantar flexion. MUSCULOSKELETAL: Pain behind right calf with flexion and extension.     Results for orders placed or performed in visit on 05/20/24  Anemia Profile B   Collection Time: 05/20/24  9:55 AM  Result Value Ref Range   Total Iron Binding Capacity 318 250 - 450 ug/dL   UIBC 750 881 - 630 ug/dL   Iron 69 27 - 860 ug/dL   Iron Saturation 22 15 - 55 %   Ferritin 34 15 - 150 ng/mL   Vitamin B-12 638 232 - 1,245 pg/mL   Folate 19.7 >3.0 ng/mL   WBC 5.7 3.4 - 10.8 x10E3/uL   RBC 3.99 3.77 - 5.28 x10E6/uL   Hemoglobin 12.3 11.1 - 15.9 g/dL   Hematocrit 61.8 65.9 - 46.6 %   MCV 96 79 - 97 fL   MCH 30.8 26.6 - 33.0 pg   MCHC 32.3 31.5 - 35.7 g/dL   RDW 86.7 88.2 - 84.5 %   Platelets 239 150 - 450 x10E3/uL   Neutrophils 52 Not Estab. %   Lymphs 32 Not Estab. %   Monocytes 8 Not Estab. %    Eos 7 Not Estab. %   Basos 1 Not Estab. %   Neutrophils Absolute 3.0 1.4 - 7.0 x10E3/uL   Lymphocytes Absolute 1.8 0.7 - 3.1 x10E3/uL   Monocytes Absolute 0.5 0.1 - 0.9 x10E3/uL   EOS (ABSOLUTE) 0.4 0.0 - 0.4 x10E3/uL   Basophils Absolute 0.1 0.0 - 0.2 x10E3/uL   Immature Granulocytes 0 Not Estab. %   Immature Grans (Abs) 0.0 0.0 - 0.1 x10E3/uL   Retic Ct Pct 1.8 0.6 - 2.6 %  Lipid Panel   Collection Time: 05/20/24  9:55 AM  Result Value Ref Range   Cholesterol, Total 223 (H) 100 -  199 mg/dL   Triglycerides 78 0 - 149 mg/dL   HDL 74 >60 mg/dL   VLDL Cholesterol Cal 14 5 - 40 mg/dL   LDL Chol Calc (NIH) 864 (H) 0 - 99 mg/dL   Chol/HDL Ratio 3.0 0.0 - 4.4 ratio  TSH + free T4   Collection Time: 05/20/24  9:55 AM  Result Value Ref Range   TSH 3.080 0.450 - 4.500 uIU/mL   Free T4 0.98 0.82 - 1.77 ng/dL       Pertinent labs & imaging results that were available during my care of the patient were reviewed by me and considered in my medical decision making.  Assessment & Plan:  Valerie Baker was seen today for leg pain.  Diagnoses and all orders for this visit:  Osteopenia, unspecified location -     Cancel: DG Foot Complete Right; Future  Primary osteoarthritis of right knee -     Cancel: DG Foot Complete Right; Future  Anterior leg pain, right -     Cancel: DG Foot Complete Right; Future  Other orders -     diclofenac  Sodium (VOLTAREN  ARTHRITIS PAIN) 1 % GEL; Apply 2 g topically 4 (four) times daily.     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 Differential includes arthritis, but further evaluation needed. - Order x-ray of right leg. - Prescribe Voltaren  gel, apply up to four times daily. - Advise heat pad use for 15 minutes as needed, caution against sleeping on it. - Recommend  avoiding prolonged standing, take breaks when necessary. - Instruct to avoid full weight on affected leg, use support when standing.  Osteoarthritis of right knee, status post total knee replacement Osteoarthritis in right knee, post-replacement. No previous pain similar to current leg pain. Symptoms may flare with cold weather. - Advise lifestyle modifications to manage symptoms, avoid prolonged standing, use support when necessary.      Continue all other maintenance medications.  Follow up plan: No follow-ups on file.   Continue healthy lifestyle choices, including diet (rich in fruits, vegetables, and lean proteins, and low in salt and simple carbohydrates) and exercise (at least 30 minutes of moderate physical activity daily).  Educational handout given for    Clinical References  Arthritis Arthritis means joint pain. It can also mean joint disease. A joint is a place where bones come together. There are more than 100 types of arthritis. What are the causes? Wear and tear of a joint. This is the most common cause. Too much of a chemical called uric acid in the blood, which leads to pain in the joint (gout). Pain and swelling (inflammation) in a joint. Infection of a joint. Injuries in the joint. A reaction to medicines (allergy). In some cases, the cause may not be known. What are the signs or symptoms? Pain in a joint when moving. Redness at a joint. Swelling at a joint. Stiffness at a joint. Warmth coming from the joint. A fever. A feeling of being sick. How is this treated? This condition may be treated with: Treating the cause, if it is known. Rest. Raising (elevating) the joint. Putting cold or hot packs on the joint. Medicines to treat symptoms and reduce pain and swelling. Shots (injections) of medicines, such as cortisone, into the joint. You may also be told to make changes in your  life, such as doing exercises and losing weight. Follow these  instructions at home: Medicines Take over-the-counter and prescription medicines only as told by your doctor. Do not take aspirin  for pain if your doctor says that you may have gout. Activity Rest your joint if your doctor tells you to. Avoid activities that make the pain worse. Exercise your joint regularly as told by your doctor. Try doing exercises like: Swimming. Water  aerobics. Biking. Walking. Managing pain, stiffness, and swelling     If told, put ice on the affected area. To do this: Put ice in a plastic bag. Place a towel between your skin and the bag. Leave the ice on for 20 minutes, 2-3 times a day. Take off the ice if your skin turns bright red. This is very important. If you cannot feel pain, heat, or cold, you have a greater risk of damage to the area. If your joint is swollen, raise (elevate) it above the level of your heart if told by your doctor. If your joint feels stiff in the morning, try taking a warm shower. If told, put heat on the affected area. Do this as often as told by your doctor. Use the heat source that your doctor recommends, such as a moist heat pack or a heating pad. If you have diabetes, do not apply heat without asking your doctor. To apply heat: Place a towel between your skin and the heat source. Leave the heat on for 20-30 minutes. Take off the heat if your skin turns bright red. This is very important. If you cannot feel pain, heat, or cold, you have a greater risk of getting burned. General instructions Maintain a healthy weight. Follow instructions from your doctor for weight control. Do not smoke or use any products that contain nicotine or tobacco. If you need help quitting, ask your doctor. Keep all follow-up visits. Where to find more information [employer]: www.niams.http://www.myers.net/ Contact a doctor if: The pain gets worse. You have a fever. Get help right away if: You have very bad pain in your joint. You have swelling  in your joint. Your joint is red. Many joints become painful and swollen. You have very bad back pain. Your leg is very weak. Summary Arthritis means joint pain. It can also mean joint disease. A joint is a place where bones come together. The most common cause of this condition is wear and tear of a joint. Symptoms of this condition include redness, swelling, or stiffness of the joint. This condition is treated with rest, raising the joint, medicines, and putting cold or hot packs on the joint. Follow your doctor's instructions about medicines, activity, exercises, and other home care treatments. This information is not intended to replace advice given to you by your health care provider. Make sure you discuss any questions you have with your health care provider. Document Revised: 05/09/2021 Document Reviewed: 05/09/2021 Elsevier Patient Education  2024 Elsevier Inc. Arthritis: What to Know Arthritis is a term for joint pain. It can also mean joint disease. There are more than 100 types of arthritis. What are the causes? Wear and tear of a joint. This is the most common cause. Gout. This is when you get too much of a chemical called uric acid in your blood. It can build up and cause pain. Pain and swelling in a joint. Infection of a joint. Injuries in the joint. A reaction to medicines. In some cases, the cause may not be known. What are the signs  or symptoms? Pain in a joint when moving. This is the main symptom. Redness, swelling, or stiffness at a joint. Warmth coming from a joint. A fever. A feeling of being sick. How is this diagnosed? You may be diagnosed based on an exam and tests. These tests may include: Blood tests. Pee tests. Imaging tests, such as: X-rays. An MRI. A CT scan. A biopsy. This is when fluid is removed from a joint for testing. How is this treated? To treat arthritis, you may need to: Treat the cause, if you know what it is. Rest. Raise the  joint. Put cold or hot packs on the joint. Take medicines to: Treat symptoms. Reduce pain and swelling. Have shots of a medicine, such as cortisone, put into the joint. You may also be told to make changes in your life. You may be told to: Do exercises. Lose weight. Follow these instructions at home: Medicines Take your medicines only as told. Do not take aspirin  if your health care provider says that you may have gout. Activity Rest your joint as told. Avoid doing things that make your pain worse. Exercise as told. You may need to do range-of-motion exercises or try: Swimming. Water  aerobics. Biking. Walking. Managing pain, stiffness, and swelling     Use ice or an ice pack as told. Place a towel between your skin and the ice. Leave the ice on for 20 minutes, 2-3 times a day. Use heat as told. Use the heat source that your provider recommends, such as a moist heat pack or a heating pad. Do this as often as told. Place a towel between your skin and the heat source. Leave the heat on for 20-30 minutes. If your skin turns red, take off the ice or heat right away to prevent skin damage. The risk of damage is higher if you can't feel pain, heat, or cold. If your joint is swollen, raise it above the level of your heart as told. If your joint feels stiff in the morning, try taking a warm shower. General instructions Stay at a healthy weight. Lose weight as told. Do not smoke, vape, or use nicotine or tobacco. Where to find more information [employer]: niams.http://www.myers.net/ Contact a health care provider if: The pain gets worse. You have a fever. Get help right away if: You have very bad pain in your joint. You have swelling in your joint. Your joint is red. Many joints get painful and swollen. You have very bad back pain. Your leg is very weak. This information is not intended to replace advice given to you by your health care provider. Make sure you discuss any  questions you have with your health care provider. Document Revised: 09/13/2023 Document Reviewed: 09/13/2023 Elsevier Patient Education  2025 [employer].  The above assessment and management plan was discussed with the patient. The patient verbalized understanding of and has agreed to the management plan. Patient is aware to call the clinic if they develop any new symptoms or if symptoms persist or worsen. Patient is aware when to return to the clinic for a follow-up visit. Patient educated on when it is appropriate to go to the emergency department.    Munachimso Palin [place] Thompson, DNP [facility] [address] [place], [place] [address] [phone]

## 2024-06-03 ENCOUNTER — Ambulatory Visit: Payer: Self-pay | Admitting: Nurse Practitioner

## 2024-06-17 ENCOUNTER — Ambulatory Visit

## 2024-06-17 VITALS — BP 135/70 | HR 62 | Ht 64.0 in | Wt 208.0 lb

## 2024-06-17 DIAGNOSIS — Z0000 Encounter for general adult medical examination without abnormal findings: Secondary | ICD-10-CM | POA: Diagnosis not present

## 2024-06-17 NOTE — Progress Notes (Signed)
 Subjective:   Valerie Baker is a 68 y.o. female who presents for a Medicare Annual Wellness Visit.  I connected with  Dickey LITTIE Gander on 06/17/24 by a audio enabled telemedicine application and verified that I am speaking with the correct person using two identifiers.  Patient Location: Home  Provider Location: Office/Clinic  I discussed the limitations of evaluation and management by telemedicine. The patient expressed understanding and agreed to proceed.   Allergies (verified) Latex and Sulfonamide derivatives   History: Past Medical History:  Diagnosis Date   Adenomatous colon polyp 06/13/2006   Allergy    SINUS   Anxiety    Anxiety and depression    Arthritis    Asthma    Bladder incontinence    Blood transfusion    Cataract    Depression    Fibromyalgia    GERD (gastroesophageal reflux disease)    History of kidney stones    Hyperlipidemia    Kidney stones    Obesity    Osteopenia    Post-operative nausea and vomiting    Stress fracture of pelvis 11/03/2014   Past Surgical History:  Procedure Laterality Date   ABDOMINAL HYSTERECTOMY  1992   partial in 1994   APPENDECTOMY  1975   BACK SURGERY  2002   2002-2022 multi  x 6  fusions and rods   BREAST BIOPSY Bilateral 11/26/2017   FIBROCYSTIC CHANGES WITH CALCIFICATIONS    CERVICAL SPINE SURGERY  2014   with rod   COLONOSCOPY  06/12/2011   Aneita, 03/17/21 [facility]   ELBOW SURGERY  2003   EYE SURGERY Bilateral    cataract   FOOT SURGERY  1994   right   KIDNEY STONE SURGERY  2004   KNEE ARTHROSCOPY Left 2008   LUMBAR FUSION     2002,2003,2007,2008,2010, 2022 with fusion and rods   NASAL SINUS SURGERY  2003   NISSEN FUNDOPLICATION  2004   ROTATOR CUFF REPAIR Bilateral 8006,7996   SPINAL CORD STIMULATOR IMPLANT  2005   SPINAL CORD STIMULATOR IMPLANT  2004   SPINAL CORD STIMULATOR REMOVAL  2008   TOTAL KNEE ARTHROPLASTY Left 2008,2009   TOTAL KNEE ARTHROPLASTY Right 05/07/2022   Procedure: TOTAL  KNEE ARTHROPLASTY;  Surgeon: Melodi Lerner, MD;  Location: [facility];  Service: Orthopedics;  Laterality: Right;   Family History  Problem Relation Age of Onset   Heart disease Mother    Diabetes Mother    Melanoma Mother        nose   Stroke Father    Colon polyps Sister    Hypertension Sister    Skin cancer Sister    Diabetes Brother    Cirrhosis Brother    Hypertension Brother    Colon polyps Brother    Hypertension Brother    Stomach cancer Other    Rectal cancer Other    Esophageal cancer Other    Colon cancer Other    Social History   Occupational History   Occupation: Disabled  Tobacco Use   Smoking status: Former    Current packs/day: 0.00    Average packs/day: 2.0 packs/day for 5.0 years (10.0 ttl pk-yrs)    Types: Cigarettes    Start date: 11/02/2000    Quit date: 11/02/2005    Years since quitting: 18.6   Smokeless tobacco: Never  Vaping Use   Vaping status: Never Used  Substance and Sexual Activity   Alcohol  use: No   Drug use: No   Sexual activity: Yes  Tobacco Counseling Counseling given: Yes  SDOH Screenings   Food Insecurity: No Food Insecurity (06/17/2024)  Housing: Unknown (06/17/2024)  Transportation Needs: No Transportation Needs (06/17/2024)  Utilities: Not At Risk (06/17/2024)  Alcohol  Screen: Low Risk  (07/01/2023)  Depression (PHQ2-9): Low Risk  (06/17/2024)  Financial Resource Strain: Low Risk  (01/16/2024)   Received from [facility]  Physical Activity: Insufficiently Active (06/17/2024)  Social Connections: Moderately Integrated (06/17/2024)  Stress: No Stress Concern Present (06/17/2024)  Tobacco Use: Medium Risk (06/17/2024)  Health Literacy: Adequate Health Literacy (06/17/2024)   Depression Screen    06/17/2024   12:57 PM 05/25/2024    8:24 AM 11/18/2023   12:53 PM 09/10/2023    2:28 PM 08/21/2023    1:19 PM 07/01/2023   11:38 AM 03/29/2023    9:47 AM  PHQ 2/9 Scores  PHQ - 2 Score 0 0 0 0 0 0 0  PHQ- 9 Score 0 0 0 0   0     Goals  Addressed             This Visit's Progress    DIET - EAT MORE FRUITS AND VEGETABLES   On track    Increase physical activity   On track      Visit info / Clinical Intake: Medicare Wellness Visit Type:: Subsequent Annual Wellness Visit Medicare Wellness Visit Mode:: Telephone If telephone:: video declined If telephone or video:: vitals recorded from last visit Interpreter Needed?: No Pre-visit prep was completed: yes AWV questionnaire completed by patient prior to visit?: no Living arrangements:: lives with spouse/significant other Patient's Overall Health Status Rating: very good Typical amount of pain: none Does pain affect daily life?: no Are you currently prescribed opioids?: no  Dietary Habits and Nutritional Risks How many meals a day?: 2 Eats fruit and vegetables daily?: yes Most meals are obtained by: preparing own meals Diabetic:: no  Functional Status Activities of Daily Living (to include ambulation/medication): Independent Ambulation: Independent Medication Administration: Independent Home Management: Independent Manage your own finances?: yes Primary transportation is: driving  Fall Screening Falls in the past year?: 1 Number of falls in past year: 1 Was there an injury with Fall?: 1 Fall Risk Category Calculator: 3 Patient Fall Risk Level: High Fall Risk  Fall Risk Patient at Risk for Falls Due to: Impaired balance/gait; Impaired mobility Fall risk Follow up: Falls evaluation completed; Education provided  Home and Transportation Safety: All rugs have non-skid backing?: yes All stairs or steps have railings?: N/A, no stairs Grab bars in the bathtub or shower?: yes Have non-skid surface in bathtub or shower?: yes Good home lighting?: yes Regular seat belt use?: yes  Cognitive Assessment Difficulty concentrating, remembering, or making decisions? : no Will 6CIT or Mini Cog be Completed: yes What year is it?: 0 points What month is it?: 0  points Give patient an address phrase to remember (5 components): [address] [place], [place] About what time is it?: 0 points Count backwards from 20 to 1: 0 points Say the months of the year in reverse: 0 points Repeat the address phrase from earlier: 0 points 6 CIT Score: 0 points  Advance Directives (For Healthcare) Does Patient Have a Medical Advance Directive?: No Would patient like information on creating a medical advance directive?: Yes (MAU/Ambulatory/Procedural Areas - Information given) (advise to pick up at the office)  Reviewed/Updated  Reviewed/Updated: All; Care Teams; Medical History; Patient Goals; Surgical History; Family History; Medications        Objective:  Today's Vitals   06/17/24 1255  BP: 135/70  Pulse: 62  Weight: 208 lb (94.3 kg)  Height: 5' 4 (1.626 m)   Body mass index is 35.7 kg/m.  Current Medications (verified) Outpatient Encounter Medications as of 06/17/2024  Medication Sig   albuterol  (VENTOLIN  HFA) 108 (90 Base) MCG/ACT inhaler Inhale 2 puffs into the lungs every 6 (six) hours as needed for wheezing or shortness of breath.   ascorbic acid (VITAMIN C) 500 MG tablet Take 500 mg by mouth daily.   baclofen  (LIORESAL ) 10 MG tablet Take 0.5-1 tablets (5-10 mg total) by mouth 3 (three) times daily as needed for muscle spasms.   buPROPion  (WELLBUTRIN  SR) 150 MG 12 hr tablet Take 1 tablet (150 mg total) by mouth in the morning.   Calcium Carbonate-Vitamin D  600-400 MG-UNIT tablet Take 1 tablet by mouth 2 (two) times daily.   Cholecalciferol (VITAMIN D3) 2000 UNITS TABS Take 2,000 Units by mouth 2 (two) times daily.   cycloSPORINE  (RESTASIS ) 0.05 % ophthalmic emulsion Place 1 drop into both eyes 2 (two) times daily.   desloratadine  (CLARINEX ) 5 MG tablet Take 1 tablet (5 mg total) by mouth daily.   diclofenac  Sodium (VOLTAREN  ARTHRITIS PAIN) 1 % GEL Apply 2 g topically 4 (four) times daily.   DULoxetine  (CYMBALTA ) 30 MG capsule Take 1 capsule (30 mg  total) by mouth 2 (two) times daily.   fluocinonide  gel (LIDEX ) 0.05 % Apply 0.5gm to affected areas as directed   fluticasone  (FLONASE ) 50 MCG/ACT nasal spray Place 2 sprays into both nostrils at bedtime.   gabapentin  (NEURONTIN ) 400 MG capsule Take 2 capsules (800 mg total) by mouth 3 (three) times daily.   guaifenesin  (HUMIBID E) 400 MG TABS tablet Take 1 tablet (400 mg total) by mouth every 6 (six) hours as needed.   levothyroxine (SYNTHROID) 25 MCG tablet Take 1 tablet (25 mcg total) by mouth daily before breakfast. Separate food/ drink/ meds by at least 30 mins after taking.   meloxicam  (MOBIC ) 15 MG tablet Take 0.5-1 tablets (7.5-15 mg total) by mouth daily as needed for pain.   Multiple Vitamin (MULTIVITAMIN) tablet Take 1 tablet by mouth daily.   omeprazole  (PRILOSEC) 40 MG capsule Take 1 capsule (40 mg total) by mouth 2 (two) times daily.   OVER THE COUNTER MEDICATION Fish Oil-Take 2 capsule by mouth daily.   OVER THE COUNTER MEDICATION Zinc 50mg -Take 1 capsule by mouth daily.   oxybutynin  (DITROPAN ) 5 MG tablet Take 1 tablet (5 mg total) by mouth 2 (two) times daily.   oxyCODONE -acetaminophen  (PERCOCET) 7.5-325 MG tablet Take 1 tablet by mouth 2 (two) times daily as needed for severe pain.   Povidone, PF, (IVIZIA DRY EYES) 0.5 % SOLN Place 1 drop into both eyes every evening.   traZODone  (DESYREL ) 100 MG tablet Take 1 tablet (100 mg total) by mouth at bedtime as needed for sleep.   zinc gluconate 50 MG tablet Take 50 mg by mouth daily.   No facility-administered encounter medications on file as of 06/17/2024.   Hearing/Vision screen Hearing Screening - Comments:: Pt denies hearing dif Vision Screening - Comments:: Last ov 2025 Immunizations and Health Maintenance Health Maintenance  Topic Date Due   COVID-19 Vaccine (3 - Moderna risk series) 04/13/2020   Mammogram  05/27/2025   Medicare Annual Wellness (AWV)  06/17/2025   DEXA SCAN  11/18/2025   Colonoscopy  03/17/2028    DTaP/Tdap/Td (3 - Td or Tdap) 12/17/2033   Pneumococcal Vaccine: 50+ Years  Completed   Influenza Vaccine  Completed   Hepatitis C Screening  Completed   Zoster Vaccines- Shingrix  Completed   Meningococcal B Vaccine  Aged Out        Assessment/Plan:  This is a routine wellness examination for [facility].  Patient Care Team: Jolinda Norene HERO, DO as PCP - General (Family Medicine) Gust Royden ORN, MD (Orthopedic Surgery) Vernetta Lonni GRADE, MD as Consulting Physician (Orthopedic Surgery) Ernie Cough, MD as Consulting Physician (Orthopedic Surgery) Karenann Barnacle, PA-C as Physician Assistant (Orthopedic Surgery)  I have personally reviewed and noted the following in the patient's chart:   Medical and social history Use of alcohol , tobacco or illicit drugs  Current medications and supplements including opioid prescriptions. Functional ability and status Nutritional status Physical activity Advanced directives List of other physicians Hospitalizations, surgeries, and ER visits in previous 12 months Vitals Screenings to include cognitive, depression, and falls Referrals and appointments  No orders of the defined types were placed in this encounter.  In addition, I have reviewed and discussed with patient certain preventive protocols, quality metrics, and best practice recommendations. A written personalized care plan for preventive services as well as general preventive health recommendations were provided to patient.   Ozie Ned, CMA   06/17/2024   Return in 1 year (on 06/17/2025).  After Visit Summary: (MyChart) Due to this being a telephonic visit, the after visit summary with patients personalized plan was offered to patient via MyChart   Nurse Notes: n/a

## 2024-06-18 ENCOUNTER — Ambulatory Visit: Payer: Self-pay

## 2024-06-18 ENCOUNTER — Encounter: Payer: Self-pay | Admitting: Nurse Practitioner

## 2024-06-18 ENCOUNTER — Ambulatory Visit (INDEPENDENT_AMBULATORY_CARE_PROVIDER_SITE_OTHER): Admitting: Nurse Practitioner

## 2024-06-18 VITALS — BP 127/68 | HR 71 | Temp 98.3°F | Ht 64.0 in | Wt 206.6 lb

## 2024-06-18 DIAGNOSIS — R519 Headache, unspecified: Secondary | ICD-10-CM

## 2024-06-18 MED ORDER — NAPROXEN 500 MG PO TABS
500.0000 mg | ORAL_TABLET | Freq: Two times a day (BID) | ORAL | 0 refills | Status: DC
Start: 2024-06-18 — End: 2024-06-18

## 2024-06-18 NOTE — Telephone Encounter (Signed)
 Appointment scheduled.

## 2024-06-18 NOTE — Telephone Encounter (Signed)
 FYI Only or Action Required?: FYI only for provider: appointment scheduled on 06/18/2024 at 2:35pm with the Doctor of the Day.  Patient was last seen in primary care on 06/02/2024 by Deitra Morton Sebastian Nena, NP.  Called Nurse Triage reporting Headache.  Symptoms began 3 weeks ago.  Interventions attempted: OTC medications: tylenol  and Rest, hydration, or home remedies.  Symptoms are: unchanged.  Triage Disposition: See HCP Within 4 Hours (Or PCP Triage)  Patient/caregiver understands and will follow disposition?: Yes            Copied from CRM #[id number]. Topic: Clinical - Red Word Triage >> Jun 18, 2024 12:02 PM Sophia H wrote: Red Word that prompted transfer to Nurse Triage: Going on 3 weeks, patient states feels like someone is hitting her repeatedly in the back of her head / severe pain. Requesting appt Reason for Disposition . [1] SEVERE headache (e.g., excruciating) AND [2] not improved after 2 hours of pain medicine  Answer Assessment - Initial Assessment Questions Headache started 3 weeks ago Patient denies any injuries other than 2 weeks ago she states that she fell forward and hit her husbands shoe but she states she had her eyes checked since then and they were fine she states Patient states she had a fusion around the year of 2015-2016 She states the pain is in the back of her head  Patient is advised to call us  back if anything changes or with any further questions/concerns. Patient is advised that if anything worsens to go to the Emergency Room. Patient verbalized understanding.    1. LOCATION: Where does it hurt?      Back of head 2. ONSET: When did the headache start? (e.g., minutes, hours, days)      3 weeks ago 3. PATTERN: Does the pain come and go, or has it been constant since it started?     Constant  worse at times  4. SEVERITY: How bad is the pain? and What does it keep you from doing?  (e.g., Scale 1-10; mild, moderate, or severe)      Pretty bad 5. RECURRENT SYMPTOM: Have you ever had headaches before? If Yes, ask: When was the last time? and What happened that time?      denies 6. CAUSE: What do you think is causing the headache?     Unsure 7. MIGRAINE: Have you been diagnosed with migraine headaches? If Yes, ask: Is this headache similar?      denies 8. HEAD INJURY: Has there been any recent injury to your head?      Denies 9. OTHER SYMPTOMS: Do you have any other symptoms? (e.g., fever, stiff neck, eye pain, sore throat, cold symptoms)     denies  Protocols used: Headache-A-AH

## 2024-06-18 NOTE — Progress Notes (Signed)
 Focal Subjective:  Patient ID: Valerie Baker, female    DOB: 10/04/1955, 68 y.o.   MRN: [id number]  Patient Care Team: Jolinda Norene HERO, DO as PCP - General (Family Medicine) Gust Royden ORN, MD (Orthopedic Surgery) Vernetta Lonni GRADE, MD as Consulting Physician (Orthopedic Surgery) Ernie Cough, MD as Consulting Physician (Orthopedic Surgery) Mahar, Elida, [place] as Physician Assistant (Orthopedic Surgery)   Chief Complaint:  Headache (X 3 weeks)   HPI: Valerie Baker is a 68 y.o. female presenting on 06/18/2024 for Headache (X 3 weeks)   Discussed the use of AI scribe software for clinical note transcription with the patient, who gave verbal consent to proceed.  History of Present Illness Valerie Baker is a 68 year old female who presents with a persistent headache.  She has been experiencing a headache located at the occipital region for the past three weeks. The pain is described as feeling like being hit with a two by four and has recently spread to her left temple, becoming sharp with head movements.  Not the worst headache of her life.  No recent falls have been reported that could have caused the headache, although she did fall a couple of weeks ago, tripping over a rug and hitting her eye on a shoe, but she did not lose consciousness or hit her head. Her vision remains unchanged and was recently checked.  She has been taking Tylenol , two tablets in the morning, and occasionally uses pain pills, but these do not alleviate the pain. She has not tried ibuprofen or Advil for this headache.  She reports sleeping issues, waking up frequently at night to use the bathroom. She takes oxybutynin , 5 mg in the morning and at night. She also takes trazodone  for sleep, but her sleep quality varies.  Her past medical history includes a neck fusion surgery performed a few years ago, and she experiences pain in her shoulder blades since the surgery. She is currently on  multiple medications including meloxicam , gabapentin , and baclofen , and has been attempting to reduce her medication intake due to side effects like dry mouth.  No nasal congestion, cough, fever, chest pain, shortness of breath, or double vision. Reports frequent urination at night.      Relevant past medical, surgical, family, and social history reviewed and updated as indicated.  Allergies and medications reviewed and updated. Data reviewed: Chart in Epic.   Past Medical History:  Diagnosis Date   Adenomatous colon polyp 06/13/2006   Allergy    SINUS   Anxiety    Anxiety and depression    Arthritis    Asthma    Bladder incontinence    Blood transfusion    Cataract    Depression    Fibromyalgia    GERD (gastroesophageal reflux disease)    History of kidney stones    Hyperlipidemia    Kidney stones    Obesity    Osteopenia    Post-operative nausea and vomiting    Stress fracture of pelvis 11/03/2014    Past Surgical History:  Procedure Laterality Date   ABDOMINAL HYSTERECTOMY  1992   partial in 1994   APPENDECTOMY  1975   BACK SURGERY  2002   2002-2022 multi  x 6  fusions and rods   BREAST BIOPSY Bilateral 11/26/2017   FIBROCYSTIC CHANGES WITH CALCIFICATIONS    CERVICAL SPINE SURGERY  2014   with rod   COLONOSCOPY  06/12/2011   [facility], 03/17/21 [facility]   ELBOW SURGERY  2003   EYE SURGERY Bilateral    cataract   FOOT SURGERY  1994   right   KIDNEY STONE SURGERY  2004   KNEE ARTHROSCOPY Left 2008   LUMBAR FUSION     2002,2003,2007,2008,2010, 2022 with fusion and rods   NASAL SINUS SURGERY  2003   NISSEN FUNDOPLICATION  2004   ROTATOR CUFF REPAIR Bilateral 8006,7996   SPINAL CORD STIMULATOR IMPLANT  2005   SPINAL CORD STIMULATOR IMPLANT  2004   SPINAL CORD STIMULATOR REMOVAL  2008   TOTAL KNEE ARTHROPLASTY Left 2008,2009   TOTAL KNEE ARTHROPLASTY Right 05/07/2022   Procedure: TOTAL KNEE ARTHROPLASTY;  Surgeon: Melodi Lerner, MD;  Location: [facility];  Service:  Orthopedics;  Laterality: Right;    Social History   Socioeconomic History   Marital status: Married    Spouse name: Not on file   Number of children: 3   Years of education: Not on file   Highest education level: Not on file  Occupational History   Occupation: Disabled  Tobacco Use   Smoking status: Former    Current packs/day: 0.00    Average packs/day: 2.0 packs/day for 5.0 years (10.0 ttl pk-yrs)    Types: Cigarettes    Start date: 11/02/2000    Quit date: 11/02/2005    Years since quitting: 18.6   Smokeless tobacco: Never  Vaping Use   Vaping status: Never Used  Substance and Sexual Activity   Alcohol  use: No   Drug use: No   Sexual activity: Yes  Other Topics Concern   Not on file  Social History Narrative   Not on file   Social Drivers of Health   Financial Resource Strain: Low Risk  (01/16/2024)   Received from [facility]   Overall Financial Resource Strain (CARDIA)    Difficulty of Paying Living Expenses: Not hard at all  Food Insecurity: No Food Insecurity (06/17/2024)   Hunger Vital Sign    Worried About Running Out of Food in the Last Year: Never true    Ran Out of Food in the Last Year: Never true  Transportation Needs: No Transportation Needs (06/17/2024)   PRAPARE - Administrator, Civil Service (Medical): No    Lack of Transportation (Non-Medical): No  Physical Activity: Insufficiently Active (06/17/2024)   Exercise Vital Sign    Days of Exercise per Week: 2 days    Minutes of Exercise per Session: 20 min  Stress: No Stress Concern Present (06/17/2024)   [employer] of Occupational Health - Occupational Stress Questionnaire    Feeling of Stress: Not at all  Social Connections: Moderately Integrated (06/17/2024)   Social Connection and Isolation Panel    Frequency of Communication with Friends and Family: More than three times a week    Frequency of Social Gatherings with Friends and Family: More than three times a week    Attends  Religious Services: More than 4 times per year    Active Member of [employer] or Organizations: No    Attends Banker Meetings: Never    Marital Status: Married  Catering Manager Violence: Not At Risk (06/17/2024)   Humiliation, Afraid, Rape, and Kick questionnaire    Fear of Current or Ex-Partner: No    Emotionally Abused: No    Physically Abused: No    Sexually Abused: No    Outpatient Encounter Medications as of 06/18/2024  Medication Sig   albuterol  (VENTOLIN  HFA) 108 (90 Base) MCG/ACT inhaler Inhale 2 puffs into the  lungs every 6 (six) hours as needed for wheezing or shortness of breath.   ascorbic acid (VITAMIN C) 500 MG tablet Take 500 mg by mouth daily.   baclofen  (LIORESAL ) 10 MG tablet Take 0.5-1 tablets (5-10 mg total) by mouth 3 (three) times daily as needed for muscle spasms.   buPROPion  (WELLBUTRIN  SR) 150 MG 12 hr tablet Take 1 tablet (150 mg total) by mouth in the morning.   Calcium Carbonate-Vitamin D  600-400 MG-UNIT tablet Take 1 tablet by mouth 2 (two) times daily.   Cholecalciferol (VITAMIN D3) 2000 UNITS TABS Take 2,000 Units by mouth 2 (two) times daily.   cycloSPORINE  (RESTASIS ) 0.05 % ophthalmic emulsion Place 1 drop into both eyes 2 (two) times daily.   desloratadine  (CLARINEX ) 5 MG tablet Take 1 tablet (5 mg total) by mouth daily.   diclofenac  Sodium (VOLTAREN  ARTHRITIS PAIN) 1 % GEL Apply 2 g topically 4 (four) times daily.   DULoxetine  (CYMBALTA ) 30 MG capsule Take 1 capsule (30 mg total) by mouth 2 (two) times daily.   fluocinonide  gel (LIDEX ) 0.05 % Apply 0.5gm to affected areas as directed   fluticasone  (FLONASE ) 50 MCG/ACT nasal spray Place 2 sprays into both nostrils at bedtime.   gabapentin  (NEURONTIN ) 400 MG capsule Take 2 capsules (800 mg total) by mouth 3 (three) times daily.   guaifenesin  (HUMIBID E) 400 MG TABS tablet Take 1 tablet (400 mg total) by mouth every 6 (six) hours as needed.   levothyroxine (SYNTHROID) 25 MCG tablet Take 1 tablet (25  mcg total) by mouth daily before breakfast. Separate food/ drink/ meds by at least 30 mins after taking.   meloxicam  (MOBIC ) 15 MG tablet Take 0.5-1 tablets (7.5-15 mg total) by mouth daily as needed for pain.   Multiple Vitamin (MULTIVITAMIN) tablet Take 1 tablet by mouth daily.   omeprazole  (PRILOSEC) 40 MG capsule Take 1 capsule (40 mg total) by mouth 2 (two) times daily.   OVER THE COUNTER MEDICATION Fish Oil-Take 2 capsule by mouth daily.   OVER THE COUNTER MEDICATION Zinc 50mg -Take 1 capsule by mouth daily.   oxybutynin  (DITROPAN ) 5 MG tablet Take 1 tablet (5 mg total) by mouth 2 (two) times daily.   oxyCODONE -acetaminophen  (PERCOCET) 7.5-325 MG tablet Take 1 tablet by mouth 2 (two) times daily as needed for severe pain.   Povidone, PF, (IVIZIA DRY EYES) 0.5 % SOLN Place 1 drop into both eyes every evening.   traZODone  (DESYREL ) 100 MG tablet Take 1 tablet (100 mg total) by mouth at bedtime as needed for sleep.   zinc gluconate 50 MG tablet Take 50 mg by mouth daily.   No facility-administered encounter medications on file as of 06/18/2024.    Allergies  Allergen Reactions   Latex Itching, Rash and Hives   Sulfonamide Derivatives Dermatitis    Pertinent ROS per HPI, otherwise unremarkable      Objective:  There were no vitals taken for this visit.   Wt Readings from Last 3 Encounters:  06/17/24 208 lb (94.3 kg)  06/02/24 208 lb 6.4 oz (94.5 kg)  05/25/24 206 lb (93.4 kg)    Physical Exam Vitals and nursing note reviewed.  Constitutional:      Appearance: She is obese.  HENT:     Head: Normocephalic and atraumatic.     Nose: Nose normal.     Mouth/Throat:     Mouth: Mucous membranes are moist.  Eyes:     Extraocular Movements: Extraocular movements intact.     Conjunctiva/sclera: Conjunctivae  normal.     Pupils: Pupils are equal, round, and reactive to light.  Cardiovascular:     Heart sounds: Normal heart sounds.  Pulmonary:     Effort: Pulmonary effort is  normal.     Breath sounds: Normal breath sounds.  Musculoskeletal:        General: Normal range of motion.     Right lower leg: No edema.     Left lower leg: No edema.  Skin:    General: Skin is warm and dry.  Neurological:     Mental Status: She is alert and oriented to person, place, and time.  Psychiatric:        Mood and Affect: Mood normal.        Behavior: Behavior normal.        Thought Content: Thought content normal.        Judgment: Judgment normal.    Physical Exam      Results for orders placed or performed in visit on 05/20/24  Anemia Profile B   Collection Time: 05/20/24  9:55 AM  Result Value Ref Range   Total Iron Binding Capacity 318 250 - 450 ug/dL   UIBC 750 881 - 630 ug/dL   Iron 69 27 - 860 ug/dL   Iron Saturation 22 15 - 55 %   Ferritin 34 15 - 150 ng/mL   Vitamin B-12 638 232 - 1,245 pg/mL   Folate 19.7 >3.0 ng/mL   WBC 5.7 3.4 - 10.8 x10E3/uL   RBC 3.99 3.77 - 5.28 x10E6/uL   Hemoglobin 12.3 11.1 - 15.9 g/dL   Hematocrit 61.8 65.9 - 46.6 %   MCV 96 79 - 97 fL   MCH 30.8 26.6 - 33.0 pg   MCHC 32.3 31.5 - 35.7 g/dL   RDW 86.7 88.2 - 84.5 %   Platelets 239 150 - 450 x10E3/uL   Neutrophils 52 Not Estab. %   Lymphs 32 Not Estab. %   Monocytes 8 Not Estab. %   Eos 7 Not Estab. %   Basos 1 Not Estab. %   Neutrophils Absolute 3.0 1.4 - 7.0 x10E3/uL   Lymphocytes Absolute 1.8 0.7 - 3.1 x10E3/uL   Monocytes Absolute 0.5 0.1 - 0.9 x10E3/uL   EOS (ABSOLUTE) 0.4 0.0 - 0.4 x10E3/uL   Basophils Absolute 0.1 0.0 - 0.2 x10E3/uL   Immature Granulocytes 0 Not Estab. %   Immature Grans (Abs) 0.0 0.0 - 0.1 x10E3/uL   Retic Ct Pct 1.8 0.6 - 2.6 %  Lipid Panel   Collection Time: 05/20/24  9:55 AM  Result Value Ref Range   Cholesterol, Total 223 (H) 100 - 199 mg/dL   Triglycerides 78 0 - 149 mg/dL   HDL 74 >60 mg/dL   VLDL Cholesterol Cal 14 5 - 40 mg/dL   LDL Chol Calc (NIH) 864 (H) 0 - 99 mg/dL   Chol/HDL Ratio 3.0 0.0 - 4.4 ratio  TSH + free T4    Collection Time: 05/20/24  9:55 AM  Result Value Ref Range   TSH 3.080 0.450 - 4.500 uIU/mL   Free T4 0.98 0.82 - 1.77 ng/dL       Pertinent labs & imaging results that were available during my care of the patient were reviewed by me and considered in my medical decision making.  Assessment & Plan:  There are no diagnoses linked to this encounter.   Assessment and Plan Valerie Baker is 68 year old Caucasian female seen today for headache, no acute distress Assessment & Plan  Headache, unspecified Chronic occipital headache extending to left temple, severe, unresponsive to acetaminophen  or oxycodone . Differential includes post-traumatic headache or referred pain from cervical fusion. Declined CT scan. Discussed dark chocolate as potential trigger. - Advise OTC naproxen  500 mg BID with food. - Provided Nurtec 75 mg sample. - Advised headache diary for frequency, duration, triggers. - Instructed to avoid dark chocolate. - Encouraged hydration, moderate caffeine. - Advised follow-up if headache persists or worsens.      Continue all other maintenance medications.  Follow up plan: No follow-ups on file.   Continue healthy lifestyle choices, including diet (rich in fruits, vegetables, and lean proteins, and low in salt and simple carbohydrates) and exercise (at least 30 minutes of moderate physical activity daily).  Educational handout given for    Migraine Headache A migraine headache is a very strong throbbing pain on one or both sides of your head. This type of headache can also cause other symptoms. It can last from 4 hours to 3 days. Talk with your doctor about what things may bring on (trigger) this condition. What are the causes? The exact cause of a migraine is not known. This condition may be brought on or caused by: Smoking. Medicines, such as: Medicine used to treat chest pain (nitroglycerin). Birth control pills. Estrogen. Some blood pressure medicines. Certain  substances in some foods or drinks. Foods and drinks, such as: Cheese. Chocolate. Alcohol . Caffeine. Doing physical activity that is very hard. Other things that may trigger a migraine headache include: Periods. Pregnancy. Hunger. Stress. Getting too much or too little sleep. Weather changes. Feeling tired (fatigue). What increases the risk? Being 4-73 years old. Being female. Having a family history of migraine headaches. Being Caucasian. Having a mental health condition, such as being sad (depressed) or feeling worried or nervous (anxious). Being very overweight (obese). What are the signs or symptoms? A throbbing pain. This pain may: Happen in any area of the head, such as on one or both sides. Make it hard to do daily activities. Get worse with physical activity. Get worse around bright lights, loud noises, or smells. Other symptoms may include: Feeling like you may vomit (nauseous). Vomiting. Dizziness. Before a migraine headache starts, you may get warning signs (an aura). An aura may include: Seeing flashing lights or having blind spots. Seeing bright spots, halos, or zigzag lines. Having tunnel vision or blurred vision. Having numbness or a tingling feeling. Having trouble talking. Having weak muscles. After a migraine ends, you may have symptoms. These may include: Tiredness. Trouble thinking (concentrating). How is this treated? Taking medicines that: Relieve pain. Relieve the feeling like you may vomit. Prevent migraine headaches. Treatment may also include: Acupuncture. Lifestyle changes like avoiding foods that bring on migraine headaches. Learning ways to control your body functions (biofeedback). Therapy to help you know and deal with negative thoughts (cognitive behavioral therapy). Follow these instructions at home: Medicines Take over-the-counter and prescription medicines only as told by your doctor. If told, take steps to prevent problems  with pooping (constipation). You may need to: Drink enough fluid to keep your pee (urine) pale yellow. Take medicines. You will be told what medicines to take. Eat foods that are high in fiber. These include beans, whole grains, and fresh fruits and vegetables. Limit foods that are high in fat and sugar. These include fried or sweet foods. Ask your doctor if you should avoid driving or using machines while you are taking your medicine. Lifestyle  Do not drink alcohol . Do not  smoke or use any products that contain nicotine or tobacco. If you need help quitting, ask your doctor. Get 7-9 hours of sleep each night, or the amount recommended by your doctor. Find ways to deal with stress, such as meditation, deep breathing, or yoga. Try to exercise often. This can help lessen how bad and how often your migraines happen. General instructions Keep a journal to find out what may bring on your migraine headaches. This can help you avoid those things. For example, write down: What you eat and drink. How much sleep you get. Any change to your medicines or diet. If you have a migraine headache: Avoid things that make your symptoms worse, such as bright lights. Lie down in a dark, quiet room. Do not drive or use machinery. Ask your doctor what activities are safe for you. Where to find more information Coalition for Headache and Migraine Patients (CHAMP): headachemigraine.org American Migraine Foundation: americanmigrainefoundation.org National Headache Foundation: headaches.org Contact a doctor if: You get a migraine headache that is different or worse than others you have had. You have more than 15 days of headaches in one month. Get help right away if: Your migraine headache gets very bad. Your migraine headache lasts more than 72 hours. You have a fever or stiff neck. You have trouble seeing. Your muscles feel weak or like you cannot control them. You lose your balance a lot. You have  trouble walking. You faint. You have a seizure. This information is not intended to replace advice given to you by your health care provider. Make sure you discuss any questions you have with your health care provider. Document Revised: 03/26/2022 Document Reviewed: 03/26/2022 Elsevier Patient Education  2024 Elsevier Inc.    The above assessment and management plan was discussed with the patient. The patient verbalized understanding of and has agreed to the management plan. Patient is aware to call the clinic if they develop any new symptoms or if symptoms persist or worsen. Patient is aware when to return to the clinic for a follow-up visit. Patient educated on when it is appropriate to go to the emergency department.   Cristoval Teall [place] Thompson, DNP [facility] [address] [place], [place] [address] [phone]

## 2024-06-30 ENCOUNTER — Other Ambulatory Visit: Payer: Self-pay | Admitting: Family Medicine

## 2024-06-30 DIAGNOSIS — Z1231 Encounter for screening mammogram for malignant neoplasm of breast: Secondary | ICD-10-CM

## 2024-07-01 ENCOUNTER — Ambulatory Visit: Payer: Medicare Other

## 2024-07-15 ENCOUNTER — Ambulatory Visit

## 2024-07-17 ENCOUNTER — Ambulatory Visit

## 2024-07-25 ENCOUNTER — Other Ambulatory Visit: Payer: Self-pay | Admitting: Family Medicine

## 2024-07-25 DIAGNOSIS — R350 Frequency of micturition: Secondary | ICD-10-CM

## 2024-08-12 ENCOUNTER — Ambulatory Visit

## 2024-08-18 ENCOUNTER — Other Ambulatory Visit: Payer: Self-pay

## 2024-08-18 DIAGNOSIS — E038 Other specified hypothyroidism: Secondary | ICD-10-CM

## 2024-08-18 LAB — LIPID PANEL
Chol/HDL Ratio: 2.3 ratio (ref 0.0–4.4)
Cholesterol, Total: 229 mg/dL — ABNORMAL HIGH (ref 100–199)
HDL: 98 mg/dL
LDL Chol Calc (NIH): 125 mg/dL — ABNORMAL HIGH (ref 0–99)
Triglycerides: 33 mg/dL (ref 0–149)
VLDL Cholesterol Cal: 6 mg/dL (ref 5–40)

## 2024-08-18 LAB — TSH+FREE T4
Free T4: 1.2 ng/dL (ref 0.82–1.77)
TSH: 3.12 u[IU]/mL (ref 0.450–4.500)

## 2024-08-19 ENCOUNTER — Ambulatory Visit: Payer: Self-pay | Admitting: Family Medicine

## 2024-08-19 DIAGNOSIS — E7800 Pure hypercholesterolemia, unspecified: Secondary | ICD-10-CM

## 2024-08-20 ENCOUNTER — Ambulatory Visit: Admitting: Family Medicine

## 2024-08-21 MED ORDER — ROSUVASTATIN CALCIUM 10 MG PO TABS: 10.0000 mg | ORAL_TABLET | Freq: Every day | ORAL | 3 refills | Status: AC

## 2024-08-24 ENCOUNTER — Encounter: Payer: Self-pay | Admitting: Family Medicine

## 2024-08-24 ENCOUNTER — Ambulatory Visit: Payer: Self-pay | Admitting: Family Medicine

## 2024-08-24 VITALS — BP 134/78 | HR 61 | Temp 97.4°F | Ht 64.0 in | Wt 216.5 lb

## 2024-08-24 DIAGNOSIS — E7800 Pure hypercholesterolemia, unspecified: Secondary | ICD-10-CM

## 2024-08-24 DIAGNOSIS — J301 Allergic rhinitis due to pollen: Secondary | ICD-10-CM | POA: Diagnosis not present

## 2024-08-24 DIAGNOSIS — M51372 Other intervertebral disc degeneration, lumbosacral region with discogenic back pain and lower extremity pain: Secondary | ICD-10-CM | POA: Diagnosis not present

## 2024-08-24 DIAGNOSIS — F339 Major depressive disorder, recurrent, unspecified: Secondary | ICD-10-CM | POA: Diagnosis not present

## 2024-08-24 DIAGNOSIS — R350 Frequency of micturition: Secondary | ICD-10-CM

## 2024-08-24 DIAGNOSIS — R5382 Chronic fatigue, unspecified: Secondary | ICD-10-CM

## 2024-08-24 LAB — BAYER DCA HB A1C WAIVED: HB A1C (BAYER DCA - WAIVED): 5.6 % (ref 4.8–5.6)

## 2024-08-24 MED ORDER — DULOXETINE HCL 30 MG PO CPEP: 30.0000 mg | ORAL_CAPSULE | Freq: Every day | ORAL | 3 refills | Status: AC

## 2024-08-24 MED ORDER — FLUTICASONE PROPIONATE 50 MCG/ACT NA SUSP: 2.0000 | Freq: Every day | NASAL | 0 refills | Status: AC

## 2024-08-24 MED ORDER — GABAPENTIN 400 MG PO CAPS: ORAL_CAPSULE | ORAL | 3 refills | Status: AC

## 2024-08-24 MED ORDER — OXYBUTYNIN CHLORIDE ER 10 MG PO TB24: 10.0000 mg | ORAL_TABLET | Freq: Every day | ORAL | 3 refills | Status: AC

## 2024-08-24 MED ORDER — MELOXICAM 7.5 MG PO TABS: 7.5000 mg | ORAL_TABLET | Freq: Every day | ORAL | 3 refills | Status: AC | PRN

## 2024-08-24 MED ORDER — TRAZODONE HCL 50 MG PO TABS: 50.0000 mg | ORAL_TABLET | Freq: Every evening | ORAL | 3 refills | Status: AC | PRN

## 2024-08-24 NOTE — Progress Notes (Signed)
 "  Subjective: CC: Follow-up hyperlipidemia PCP: Jolinda Norene HERO, DO YEP:Valerie Baker is a 69 y.o. female presenting to clinic today for:  Patient was just started on rosuvastatin  10 mg daily due to uncontrolled cholesterol with ASCVD risk score that was elevated.  She is tolerating without difficulty so far.  She continues to suffer from chronic pain in her shoulders and neck but this is postop and stable.  She is actually wanting to wean from gabapentin , Mobic , Cymbalta  and trazodone  and has already self reduced doses.  She has also started weaning off of Wellbutrin  entirely.  She is trying to be on the lowest dose possible of any of her medications as she does not want to impair memory and function as she ages.  She has been told that she needs shoulder replacements bilaterally but wants to hold off on this as long as possible.  She does report ongoing overactive bladder symptoms and asking to advance her Ditropan  however.   ROS: Per HPI  Allergies[1] Past Medical History:  Diagnosis Date   Adenomatous colon polyp 06/13/2006   Allergy    SINUS   Anxiety    Anxiety and depression    Arthritis    Asthma    Bladder incontinence    Blood transfusion    Cataract    Depression    Fibromyalgia    GERD (gastroesophageal reflux disease)    History of kidney stones    Hyperlipidemia    Kidney stones    Obesity    Osteopenia    Post-operative nausea and vomiting    Stress fracture of pelvis 11/03/2014   Current Medications[2] Social History   Socioeconomic History   Marital status: Married    Spouse name: Not on file   Number of children: 3   Years of education: Not on file   Highest education level: Not on file  Occupational History   Occupation: Disabled  Tobacco Use   Smoking status: Former    Current packs/day: 0.00    Average packs/day: 2.0 packs/day for 5.0 years (10.0 ttl pk-yrs)    Types: Cigarettes    Start date: 11/02/2000    Quit date: 11/02/2005     Years since quitting: 18.8   Smokeless tobacco: Never  Vaping Use   Vaping status: Never Used  Substance and Sexual Activity   Alcohol  use: No   Drug use: No   Sexual activity: Yes  Other Topics Concern   Not on file  Social History Narrative   Not on file   Social Drivers of Health   Tobacco Use: Medium Risk (08/24/2024)   Patient History    Smoking Tobacco Use: Former    Smokeless Tobacco Use: Never    Passive Exposure: Not on file  Financial Resource Strain: Low Risk (01/16/2024)   Received from [facility]   Overall Financial Resource Strain (CARDIA)    Difficulty of Paying Living Expenses: Not hard at all  Food Insecurity: No Food Insecurity (06/17/2024)   Epic    Worried About Radiation Protection Practitioner of Food in the Last Year: Never true    Ran Out of Food in the Last Year: Never true  Transportation Needs: No Transportation Needs (06/17/2024)   Epic    Lack of Transportation (Medical): No    Lack of Transportation (Non-Medical): No  Physical Activity: Insufficiently Active (06/17/2024)   Exercise Vital Sign    Days of Exercise per Week: 2 days    Minutes of Exercise per Session: 20 min  Stress: No Stress Concern Present (06/17/2024)   [employer] of Occupational Health - Occupational Stress Questionnaire    Feeling of Stress: Not at all  Social Connections: Moderately Integrated (06/17/2024)   Social Connection and Isolation Panel    Frequency of Communication with Friends and Family: More than three times a week    Frequency of Social Gatherings with Friends and Family: More than three times a week    Attends Religious Services: More than 4 times per year    Active Member of [employer] or Organizations: No    Attends Banker Meetings: Never    Marital Status: Married  Catering Manager Violence: Not At Risk (06/17/2024)   Epic    Fear of Current or Ex-Partner: No    Emotionally Abused: No    Physically Abused: No    Sexually Abused: No  Depression (PHQ2-9): Low  Risk (08/24/2024)   Depression (PHQ2-9)    PHQ-2 Score: 0  Alcohol  Screen: Low Risk (07/01/2023)   Alcohol  Screen    Last Alcohol  Screening Score (AUDIT): 0  Housing: Unknown (06/17/2024)   Epic    Unable to Pay for Housing in the Last Year: No    Number of Times Moved in the Last Year: Not on file    Homeless in the Last Year: No  Utilities: Not At Risk (06/17/2024)   Epic    Threatened with loss of utilities: No  Health Literacy: Adequate Health Literacy (06/17/2024)   B1300 Health Literacy    Frequency of need for help with medical instructions: Never   Family History  Problem Relation Age of Onset   Heart disease Mother    Diabetes Mother    Melanoma Mother        nose   Stroke Father    Colon polyps Sister    Hypertension Sister    Skin cancer Sister    Diabetes Brother    Cirrhosis Brother    Hypertension Brother    Colon polyps Brother    Hypertension Brother    Stomach cancer Other    Rectal cancer Other    Esophageal cancer Other    Colon cancer Other     Objective: Office vital signs reviewed. BP 134/78   Pulse 61   Temp (!) 97.4 F (36.3 C)   Ht 5' 4 (1.626 m)   Wt 216 lb 8 oz (98.2 kg)   SpO2 97%   BMI 37.16 kg/m   Physical Examination:  General: Awake, alert, well nourished, morbidly obese.  No acute distress HEENT: Sclera white.  Moist mucous membranes Cardio: regular rate and rhythm, S1S2 heard, no murmurs appreciated Pulm: clear to auscultation bilaterally, no wheezes, rhonchi or rales; normal work of breathing on room air MSK: Limited extension of the cervical spine.  She has hunched station.  Ambulates independently with normal gait and station.     08/24/2024    8:44 AM 06/18/2024    2:26 PM 06/17/2024   12:57 PM  Depression screen PHQ 2/9  Decreased Interest 0 0 0  Down, Depressed, Hopeless 0 0 0  PHQ - 2 Score 0 0 0  Altered sleeping 0  0  Tired, decreased energy 0  0  Change in appetite 0  0  Feeling bad or failure about yourself   0  0  Trouble concentrating 0  0  Moving slowly or fidgety/restless 0  0  Suicidal thoughts 0  0  PHQ-9 Score 0  0   Difficult doing work/chores  Not difficult at all  Not difficult at all     Data saved with a previous flowsheet row definition      08/24/2024    8:45 AM 06/18/2024    2:26 PM 05/25/2024    8:24 AM 11/18/2023   12:53 PM  GAD 7 : Generalized Anxiety Score  Nervous, Anxious, on Edge 0 0 0 0  Control/stop worrying 0 0 0 0  Worry too much - different things 0 0 0 0  Trouble relaxing 0 0 0 0  Restless 0 0 0 0  Easily annoyed or irritable 0 0 0 0  Afraid - awful might happen 0 0 0 0  Total GAD 7 Score 0 0 0 0  Anxiety Difficulty Not difficult at all  Not difficult at all Not difficult at all   Assessment/ Plan: 69 y.o. female   Pure hypercholesterolemia - Plan: Hepatic function panel, Lipid panel  Chronic fatigue  Seasonal allergic rhinitis due to pollen - Plan: fluticasone  (FLONASE ) 50 MCG/ACT nasal spray  Morbid obesity (HCC) - Plan: Bayer DCA Hb A1c Waived  Urinary frequency - Plan: oxybutynin  (DITROPAN  XL) 10 MG 24 hr tablet  Depression, recurrent - Plan: DULoxetine  (CYMBALTA ) 30 MG capsule, traZODone  (DESYREL ) 50 MG tablet  Degeneration of intervertebral disc of lumbosacral region with discogenic back pain and lower extremity pain - Plan: gabapentin  (NEURONTIN ) 400 MG capsule, meloxicam  (MOBIC ) 7.5 MG tablet   Future orders for lipid and hepatic function panel ordered.  Will see each other back in April some time for recheck on how she is tolerating rosuvastatin .  Discussed use of co-Q10 if needed but at this time she is not having any intolerance  Again discussed consideration for sleep study but she would like to hold off on this due to affordability at this time  Allergies are stable with Flonase .  Refill sent  Desired A1c collection today.  Okay to order.  Actively weaning from Wellbutrin , Cymbalta  and trazodone .  I have sent over reduced doses.   Discussed how to utilize the Wellbutrin  for the next couple of weeks then okay to discontinue as she is on the SR low-dose version, does not need prolonged wean  Advanced her pain a 10 mg extended release nightly.  Gabapentin  and meloxicam  reduced today.  This seems to be working well for her.  Rx to be placed on file   Salvatore Poe M Holliday Sheaffer, DO [facility] [phone]     [1]  Allergies Allergen Reactions   Latex Itching, Rash and Hives   Sulfonamide Derivatives Dermatitis  [2]  Current Outpatient Medications:    albuterol  (VENTOLIN  HFA) 108 (90 Base) MCG/ACT inhaler, Inhale 2 puffs into the lungs every 6 (six) hours as needed for wheezing or shortness of breath., Disp: 20.1 g, Rfl: 0   ascorbic acid (VITAMIN C) 500 MG tablet, Take 500 mg by mouth daily., Disp: , Rfl:    baclofen  (LIORESAL ) 10 MG tablet, Take 0.5-1 tablets (5-10 mg total) by mouth 3 (three) times daily as needed for muscle spasms., Disp: 270 tablet, Rfl: 3   buPROPion  (WELLBUTRIN  SR) 150 MG 12 hr tablet, Take 1 tablet (150 mg total) by mouth in the morning., Disp: 100 tablet, Rfl: 3   Calcium  Carbonate-Vitamin D  600-400 MG-UNIT tablet, Take 1 tablet by mouth 2 (two) times daily., Disp: , Rfl:    Cholecalciferol (VITAMIN D3) 2000 UNITS TABS, Take 2,000 Units by mouth 2 (two) times daily., Disp: , Rfl:    cycloSPORINE  (RESTASIS )  0.05 % ophthalmic emulsion, Place 1 drop into both eyes 2 (two) times daily., Disp: , Rfl:    desloratadine  (CLARINEX ) 5 MG tablet, Take 1 tablet (5 mg total) by mouth daily., Disp: 90 tablet, Rfl: 3   diclofenac  Sodium (VOLTAREN  ARTHRITIS PAIN) 1 % GEL, Apply 2 g topically 4 (four) times daily., Disp: 120 g, Rfl: 2   DULoxetine  (CYMBALTA ) 30 MG capsule, Take 1 capsule (30 mg total) by mouth 2 (two) times daily., Disp: 200 capsule, Rfl: 3   fluocinonide  gel (LIDEX ) 0.05 %, Apply 0.5gm to affected areas as directed, Disp: 60 g, Rfl: 1   gabapentin  (NEURONTIN ) 400 MG  capsule, Take 2 capsules (800 mg total) by mouth 3 (three) times daily., Disp: 600 capsule, Rfl: 3   levothyroxine  (SYNTHROID ) 25 MCG tablet, Take 1 tablet (25 mcg total) by mouth daily before breakfast. Separate food/ drink/ meds by at least 30 mins after taking., Disp: 90 tablet, Rfl: 3   meloxicam  (MOBIC ) 15 MG tablet, Take 0.5-1 tablets (7.5-15 mg total) by mouth daily as needed for pain., Disp: 100 tablet, Rfl: 3   Multiple Vitamin (MULTIVITAMIN) tablet, Take 1 tablet by mouth daily., Disp: , Rfl:    omeprazole  (PRILOSEC) 40 MG capsule, Take 1 capsule (40 mg total) by mouth 2 (two) times daily., Disp: 200 capsule, Rfl: 3   OVER THE COUNTER MEDICATION, Fish Oil-Take 2 capsule by mouth daily., Disp: , Rfl:    OVER THE COUNTER MEDICATION, Zinc 50mg -Take 1 capsule by mouth daily., Disp: , Rfl:    oxybutynin  (DITROPAN ) 5 MG tablet, TAKE 1 TABLET BY MOUTH TWICE  DAILY, Disp: 200 tablet, Rfl: 0   oxyCODONE -acetaminophen  (PERCOCET) 7.5-325 MG tablet, Take 1 tablet by mouth 2 (two) times daily as needed for severe pain., Disp: 60 tablet, Rfl: 0   Povidone, PF, (IVIZIA DRY EYES) 0.5 % SOLN, Place 1 drop into both eyes every evening., Disp: , Rfl:    rosuvastatin  (CRESTOR ) 10 MG tablet, Take 1 tablet (10 mg total) by mouth daily., Disp: 90 tablet, Rfl: 3   traZODone  (DESYREL ) 100 MG tablet, Take 1 tablet (100 mg total) by mouth at bedtime as needed for sleep., Disp: 100 tablet, Rfl: 3   zinc gluconate 50 MG tablet, Take 50 mg by mouth daily., Disp: , Rfl:    fluticasone  (FLONASE ) 50 MCG/ACT nasal spray, Place 2 sprays into both nostrils at bedtime., Disp: 48 g, Rfl: 0   guaifenesin  (HUMIBID E) 400 MG TABS tablet, Take 1 tablet (400 mg total) by mouth every 6 (six) hours as needed. (Patient not taking: Reported on 08/24/2024), Disp: 30 tablet, Rfl: 0  "

## 2024-08-24 NOTE — Patient Instructions (Signed)
 Coenzyme Q 10 over-the-counter if needed for any muscle aches related to statin for cholesterol

## 2024-09-17 ENCOUNTER — Ambulatory Visit

## 2024-11-24 ENCOUNTER — Ambulatory Visit

## 2024-11-27 ENCOUNTER — Encounter: Payer: Self-pay | Admitting: Family Medicine

## 2025-06-21 ENCOUNTER — Ambulatory Visit
